# Patient Record
Sex: Male | Born: 1965 | Race: Black or African American | Hispanic: No | Smoking: Never smoker
Health system: Southern US, Community
[De-identification: ages and names within clinical notes are randomized; demographics above are authoritative.]

## PROBLEM LIST (undated history)

## (undated) DIAGNOSIS — Z87442 Personal history of urinary calculi: Secondary | ICD-10-CM

## (undated) DIAGNOSIS — J189 Pneumonia, unspecified organism: Secondary | ICD-10-CM

## (undated) DIAGNOSIS — H269 Unspecified cataract: Secondary | ICD-10-CM

## (undated) DIAGNOSIS — F419 Anxiety disorder, unspecified: Secondary | ICD-10-CM

## (undated) DIAGNOSIS — D649 Anemia, unspecified: Secondary | ICD-10-CM

## (undated) DIAGNOSIS — K279 Peptic ulcer, site unspecified, unspecified as acute or chronic, without hemorrhage or perforation: Secondary | ICD-10-CM

## (undated) DIAGNOSIS — T8859XA Other complications of anesthesia, initial encounter: Secondary | ICD-10-CM

## (undated) DIAGNOSIS — F32A Depression, unspecified: Secondary | ICD-10-CM

## (undated) DIAGNOSIS — K648 Other hemorrhoids: Secondary | ICD-10-CM

## (undated) DIAGNOSIS — A6000 Herpesviral infection of urogenital system, unspecified: Secondary | ICD-10-CM

## (undated) DIAGNOSIS — M479 Spondylosis, unspecified: Secondary | ICD-10-CM

## (undated) DIAGNOSIS — T7840XA Allergy, unspecified, initial encounter: Secondary | ICD-10-CM

## (undated) DIAGNOSIS — F4310 Post-traumatic stress disorder, unspecified: Secondary | ICD-10-CM

## (undated) DIAGNOSIS — I82409 Acute embolism and thrombosis of unspecified deep veins of unspecified lower extremity: Secondary | ICD-10-CM

## (undated) DIAGNOSIS — R519 Headache, unspecified: Secondary | ICD-10-CM

## (undated) DIAGNOSIS — D126 Benign neoplasm of colon, unspecified: Secondary | ICD-10-CM

## (undated) DIAGNOSIS — I2699 Other pulmonary embolism without acute cor pulmonale: Secondary | ICD-10-CM

## (undated) DIAGNOSIS — K760 Fatty (change of) liver, not elsewhere classified: Secondary | ICD-10-CM

## (undated) DIAGNOSIS — G4730 Sleep apnea, unspecified: Secondary | ICD-10-CM

## (undated) DIAGNOSIS — U071 COVID-19: Secondary | ICD-10-CM

## (undated) DIAGNOSIS — F329 Major depressive disorder, single episode, unspecified: Secondary | ICD-10-CM

## (undated) DIAGNOSIS — Z8614 Personal history of Methicillin resistant Staphylococcus aureus infection: Secondary | ICD-10-CM

## (undated) DIAGNOSIS — E785 Hyperlipidemia, unspecified: Secondary | ICD-10-CM

## (undated) DIAGNOSIS — N200 Calculus of kidney: Secondary | ICD-10-CM

## (undated) HISTORY — DX: Personal history of urinary calculi: Z87.442

## (undated) HISTORY — DX: Calculus of kidney: N20.0

## (undated) HISTORY — DX: Anxiety disorder, unspecified: F41.9

## (undated) HISTORY — DX: Other hemorrhoids: K64.8

## (undated) HISTORY — PX: EYE SURGERY: SHX253

## (undated) HISTORY — DX: Herpesviral infection of urogenital system, unspecified: A60.00

## (undated) HISTORY — DX: Allergy, unspecified, initial encounter: T78.40XA

## (undated) HISTORY — PX: POLYPECTOMY: SHX149

## (undated) HISTORY — DX: Major depressive disorder, single episode, unspecified: F32.9

## (undated) HISTORY — DX: Depression, unspecified: F32.A

## (undated) HISTORY — DX: Benign neoplasm of colon, unspecified: D12.6

## (undated) HISTORY — DX: Spondylosis, unspecified: M47.9

## (undated) HISTORY — DX: COVID-19: U07.1

## (undated) HISTORY — DX: Hyperlipidemia, unspecified: E78.5

## (undated) HISTORY — DX: Anemia, unspecified: D64.9

## (undated) HISTORY — DX: Fatty (change of) liver, not elsewhere classified: K76.0

## (undated) HISTORY — PX: COLONOSCOPY: SHX174

## (undated) HISTORY — DX: Unspecified cataract: H26.9

## (undated) HISTORY — DX: Peptic ulcer, site unspecified, unspecified as acute or chronic, without hemorrhage or perforation: K27.9

---

## 1988-05-04 HISTORY — PX: INGUINAL HERNIA REPAIR: SUR1180

## 1994-05-04 HISTORY — PX: CATARACT EXTRACTION: SUR2

## 2000-04-08 ENCOUNTER — Emergency Department (HOSPITAL_COMMUNITY): Admission: EM | Admit: 2000-04-08 | Discharge: 2000-04-08 | Payer: Self-pay | Admitting: *Deleted

## 2004-02-08 ENCOUNTER — Encounter: Admission: RE | Admit: 2004-02-08 | Discharge: 2004-02-08 | Payer: Self-pay | Admitting: Orthopedic Surgery

## 2004-05-04 DIAGNOSIS — Z87442 Personal history of urinary calculi: Secondary | ICD-10-CM

## 2004-05-04 DIAGNOSIS — N189 Chronic kidney disease, unspecified: Secondary | ICD-10-CM

## 2004-05-04 HISTORY — PX: CYSTOSCOPY W/ STONE MANIPULATION: SHX1427

## 2004-05-04 HISTORY — DX: Chronic kidney disease, unspecified: N18.9

## 2004-05-04 HISTORY — DX: Personal history of urinary calculi: Z87.442

## 2004-07-29 ENCOUNTER — Emergency Department (HOSPITAL_COMMUNITY): Admission: EM | Admit: 2004-07-29 | Discharge: 2004-07-29 | Payer: Self-pay | Admitting: *Deleted

## 2004-09-16 ENCOUNTER — Ambulatory Visit (HOSPITAL_COMMUNITY): Admission: RE | Admit: 2004-09-16 | Discharge: 2004-09-16 | Payer: Self-pay | Admitting: Family Medicine

## 2004-09-28 ENCOUNTER — Emergency Department (HOSPITAL_COMMUNITY): Admission: EM | Admit: 2004-09-28 | Discharge: 2004-09-28 | Payer: Self-pay | Admitting: Emergency Medicine

## 2004-10-03 ENCOUNTER — Ambulatory Visit: Payer: Self-pay | Admitting: Internal Medicine

## 2004-10-10 ENCOUNTER — Ambulatory Visit: Payer: Self-pay | Admitting: Internal Medicine

## 2004-10-27 ENCOUNTER — Ambulatory Visit: Payer: Self-pay | Admitting: Internal Medicine

## 2004-11-13 ENCOUNTER — Ambulatory Visit: Payer: Self-pay | Admitting: Internal Medicine

## 2004-11-18 ENCOUNTER — Ambulatory Visit: Payer: Self-pay | Admitting: Internal Medicine

## 2005-10-22 ENCOUNTER — Emergency Department (HOSPITAL_COMMUNITY): Admission: EM | Admit: 2005-10-22 | Discharge: 2005-10-22 | Payer: Self-pay | Admitting: Emergency Medicine

## 2006-03-10 ENCOUNTER — Ambulatory Visit (HOSPITAL_BASED_OUTPATIENT_CLINIC_OR_DEPARTMENT_OTHER): Admission: RE | Admit: 2006-03-10 | Discharge: 2006-03-10 | Payer: Self-pay | Admitting: Urology

## 2006-08-05 ENCOUNTER — Ambulatory Visit: Payer: Self-pay | Admitting: Internal Medicine

## 2006-08-05 LAB — CONVERTED CEMR LAB
ALT: 34 units/L (ref 0–40)
AST: 32 units/L (ref 0–37)
Albumin: 4.3 g/dL (ref 3.5–5.2)
Alkaline Phosphatase: 82 units/L (ref 39–117)
BUN: 8 mg/dL (ref 6–23)
Basophils Absolute: 0.1 10*3/uL (ref 0.0–0.1)
Basophils Relative: 0.8 % (ref 0.0–1.0)
Bilirubin, Direct: 0.2 mg/dL (ref 0.0–0.3)
CO2: 27 meq/L (ref 19–32)
Calcium: 9.4 mg/dL (ref 8.4–10.5)
Chloride: 107 meq/L (ref 96–112)
Cholesterol: 211 mg/dL (ref 0–200)
Creatinine, Ser: 1 mg/dL (ref 0.4–1.5)
Direct LDL: 150.4 mg/dL
Eosinophils Absolute: 0.3 10*3/uL (ref 0.0–0.6)
Eosinophils Relative: 4.5 % (ref 0.0–5.0)
GFR calc Af Amer: 107 mL/min
GFR calc non Af Amer: 88 mL/min
Glucose, Bld: 88 mg/dL (ref 70–99)
HCT: 49.6 % (ref 39.0–52.0)
HDL: 43 mg/dL (ref >39.0)
Hemoglobin: 16.3 g/dL (ref 13.0–17.0)
Hgb A1c MFr Bld: 5.9 % (ref 4.6–6.0)
Lymphocytes Relative: 31.8 % (ref 12.0–46.0)
MCHC: 32.9 g/dL (ref 30.0–36.0)
MCV: 86.2 fL (ref 78.0–100.0)
Monocytes Absolute: 0.7 10*3/uL (ref 0.2–0.7)
Monocytes Relative: 9.1 % (ref 3.0–11.0)
Neutro Abs: 3.9 10*3/uL (ref 1.4–7.7)
Neutrophils Relative %: 53.8 % (ref 43.0–77.0)
PSA: 0.51 ng/mL (ref 0.10–4.00)
Platelets: 226 10*3/uL (ref 150–400)
Potassium: 4.5 meq/L (ref 3.5–5.1)
RBC: 5.75 M/uL (ref 4.22–5.81)
RDW: 14 % (ref 11.5–14.6)
Sodium: 140 meq/L (ref 135–145)
TSH: 0.8 microintl units/mL (ref 0.35–5.50)
Total Bilirubin: 1.1 mg/dL (ref 0.3–1.2)
Total CHOL/HDL Ratio: 4.9
Total Protein: 7 g/dL (ref 6.0–8.3)
Triglycerides: 127 mg/dL (ref 0–149)
VLDL: 25 mg/dL (ref 0–40)
WBC: 7.4 10*3/uL (ref 4.5–10.5)

## 2007-10-23 ENCOUNTER — Emergency Department (HOSPITAL_COMMUNITY): Admission: EM | Admit: 2007-10-23 | Discharge: 2007-10-23 | Payer: Self-pay | Admitting: Emergency Medicine

## 2007-11-18 ENCOUNTER — Encounter (INDEPENDENT_AMBULATORY_CARE_PROVIDER_SITE_OTHER): Payer: Self-pay | Admitting: *Deleted

## 2008-02-16 ENCOUNTER — Ambulatory Visit: Payer: Self-pay | Admitting: Internal Medicine

## 2008-02-16 DIAGNOSIS — E785 Hyperlipidemia, unspecified: Secondary | ICD-10-CM | POA: Insufficient documentation

## 2008-02-16 DIAGNOSIS — Z87442 Personal history of urinary calculi: Secondary | ICD-10-CM | POA: Insufficient documentation

## 2008-02-16 DIAGNOSIS — J4530 Mild persistent asthma, uncomplicated: Secondary | ICD-10-CM | POA: Insufficient documentation

## 2008-02-16 DIAGNOSIS — R9431 Abnormal electrocardiogram [ECG] [EKG]: Secondary | ICD-10-CM | POA: Insufficient documentation

## 2008-02-23 ENCOUNTER — Ambulatory Visit: Payer: Self-pay | Admitting: Internal Medicine

## 2008-02-26 LAB — CONVERTED CEMR LAB
ALT: 43 units/L (ref 0–53)
AST: 30 units/L (ref 0–37)
Albumin: 3.9 g/dL (ref 3.5–5.2)
Alkaline Phosphatase: 68 units/L (ref 39–117)
BUN: 9 mg/dL (ref 6–23)
Basophils Absolute: 0 10*3/uL (ref 0.0–0.1)
Basophils Relative: 0.3 % (ref 0.0–3.0)
Bilirubin, Direct: 0.1 mg/dL (ref 0.0–0.3)
CO2: 30 meq/L (ref 19–32)
Calcium: 9.1 mg/dL (ref 8.4–10.5)
Chloride: 104 meq/L (ref 96–112)
Cholesterol: 199 mg/dL (ref 0–200)
Creatinine, Ser: 1 mg/dL (ref 0.4–1.5)
Eosinophils Absolute: 0.2 10*3/uL (ref 0.0–0.7)
Eosinophils Relative: 3.7 % (ref 0.0–5.0)
GFR calc Af Amer: 106 mL/min
GFR calc non Af Amer: 88 mL/min
Glucose, Bld: 94 mg/dL (ref 70–99)
HCT: 48.5 % (ref 39.0–52.0)
HDL: 45.9 mg/dL (ref >39.0)
Hemoglobin: 16.5 g/dL (ref 13.0–17.0)
LDL Cholesterol: 130 mg/dL — ABNORMAL HIGH (ref 0–99)
Lymphocytes Relative: 35.1 % (ref 12.0–46.0)
MCHC: 34.1 g/dL (ref 30.0–36.0)
MCV: 85.4 fL (ref 78.0–100.0)
Monocytes Absolute: 0.5 10*3/uL (ref 0.1–1.0)
Monocytes Relative: 9.3 % (ref 3.0–12.0)
Neutro Abs: 2.5 10*3/uL (ref 1.4–7.7)
Neutrophils Relative %: 51.6 % (ref 43.0–77.0)
Platelets: 198 10*3/uL (ref 150–400)
Potassium: 4.3 meq/L (ref 3.5–5.1)
RBC: 5.67 M/uL (ref 4.22–5.81)
RDW: 14.2 % (ref 11.5–14.6)
Sodium: 140 meq/L (ref 135–145)
TSH: 1.08 microintl units/mL (ref 0.35–5.50)
Total Bilirubin: 1.1 mg/dL (ref 0.3–1.2)
Total CHOL/HDL Ratio: 4.3
Total Protein: 7.1 g/dL (ref 6.0–8.3)
Triglycerides: 114 mg/dL (ref 0–149)
VLDL: 23 mg/dL (ref 0–40)
WBC: 5 10*3/uL (ref 4.5–10.5)

## 2008-02-28 ENCOUNTER — Encounter (INDEPENDENT_AMBULATORY_CARE_PROVIDER_SITE_OTHER): Payer: Self-pay | Admitting: *Deleted

## 2008-06-05 ENCOUNTER — Encounter: Payer: Self-pay | Admitting: Internal Medicine

## 2008-08-02 ENCOUNTER — Telehealth (INDEPENDENT_AMBULATORY_CARE_PROVIDER_SITE_OTHER): Payer: Self-pay | Admitting: *Deleted

## 2008-08-02 ENCOUNTER — Ambulatory Visit: Payer: Self-pay | Admitting: Internal Medicine

## 2008-08-13 ENCOUNTER — Encounter: Payer: Self-pay | Admitting: Internal Medicine

## 2008-08-17 ENCOUNTER — Ambulatory Visit: Payer: Self-pay | Admitting: Internal Medicine

## 2008-08-17 ENCOUNTER — Telehealth: Payer: Self-pay | Admitting: Internal Medicine

## 2008-08-22 ENCOUNTER — Encounter (INDEPENDENT_AMBULATORY_CARE_PROVIDER_SITE_OTHER): Payer: Self-pay | Admitting: *Deleted

## 2008-08-22 LAB — CONVERTED CEMR LAB
ALT: 39 units/L (ref 0–53)
AST: 26 units/L (ref 0–37)
Albumin: 3.9 g/dL (ref 3.5–5.2)
Alkaline Phosphatase: 72 units/L (ref 39–117)
Bilirubin, Direct: 0.1 mg/dL (ref 0.0–0.3)
Total Bilirubin: 0.9 mg/dL (ref 0.3–1.2)
Total Protein: 7.4 g/dL (ref 6.0–8.3)

## 2008-09-12 ENCOUNTER — Encounter: Payer: Self-pay | Admitting: Internal Medicine

## 2009-01-27 ENCOUNTER — Emergency Department (HOSPITAL_COMMUNITY): Admission: EM | Admit: 2009-01-27 | Discharge: 2009-01-28 | Payer: Self-pay | Admitting: Emergency Medicine

## 2009-02-01 ENCOUNTER — Ambulatory Visit: Payer: Self-pay | Admitting: Internal Medicine

## 2009-02-01 ENCOUNTER — Encounter (INDEPENDENT_AMBULATORY_CARE_PROVIDER_SITE_OTHER): Payer: Self-pay | Admitting: *Deleted

## 2009-02-04 ENCOUNTER — Encounter (INDEPENDENT_AMBULATORY_CARE_PROVIDER_SITE_OTHER): Payer: Self-pay | Admitting: *Deleted

## 2009-03-19 ENCOUNTER — Telehealth: Payer: Self-pay | Admitting: Family

## 2009-03-19 ENCOUNTER — Ambulatory Visit: Payer: Self-pay | Admitting: Family

## 2009-03-19 ENCOUNTER — Telehealth: Payer: Self-pay | Admitting: Internal Medicine

## 2009-03-19 LAB — CONVERTED CEMR LAB: Hemoglobin: 15.2 g/dL

## 2009-03-20 ENCOUNTER — Ambulatory Visit: Payer: Self-pay | Admitting: Internal Medicine

## 2009-04-03 ENCOUNTER — Telehealth: Payer: Self-pay | Admitting: Internal Medicine

## 2009-04-04 ENCOUNTER — Encounter: Payer: Self-pay | Admitting: Internal Medicine

## 2009-05-04 DIAGNOSIS — D126 Benign neoplasm of colon, unspecified: Secondary | ICD-10-CM

## 2009-05-04 HISTORY — DX: Benign neoplasm of colon, unspecified: D12.6

## 2009-05-04 HISTORY — PX: OTHER SURGICAL HISTORY: SHX169

## 2009-05-28 ENCOUNTER — Telehealth (INDEPENDENT_AMBULATORY_CARE_PROVIDER_SITE_OTHER): Payer: Self-pay | Admitting: *Deleted

## 2009-05-28 ENCOUNTER — Encounter: Payer: Self-pay | Admitting: Physician Assistant

## 2009-05-30 ENCOUNTER — Ambulatory Visit: Payer: Self-pay | Admitting: Internal Medicine

## 2009-06-04 ENCOUNTER — Encounter: Payer: Self-pay | Admitting: Internal Medicine

## 2009-06-07 ENCOUNTER — Telehealth: Payer: Self-pay | Admitting: Internal Medicine

## 2009-06-21 ENCOUNTER — Ambulatory Visit: Payer: Self-pay | Admitting: Internal Medicine

## 2009-06-21 DIAGNOSIS — Z860100 Personal history of colon polyps, unspecified: Secondary | ICD-10-CM | POA: Insufficient documentation

## 2009-06-28 ENCOUNTER — Ambulatory Visit: Payer: Self-pay | Admitting: Internal Medicine

## 2009-06-28 LAB — CONVERTED CEMR LAB
ALT: 47 units/L (ref 0–53)
AST: 31 units/L (ref 0–37)
Albumin: 4.2 g/dL (ref 3.5–5.2)
Alkaline Phosphatase: 76 units/L (ref 39–117)
BUN: 8 mg/dL (ref 6–23)
Basophils Absolute: 0 10*3/uL (ref 0.0–0.1)
Basophils Relative: 0.1 % (ref 0.0–3.0)
Bilirubin, Direct: 0.3 mg/dL (ref 0.0–0.3)
CO2: 26 meq/L (ref 19–32)
Calcium: 9.3 mg/dL (ref 8.4–10.5)
Chloride: 110 meq/L (ref 96–112)
Cholesterol: 212 mg/dL — ABNORMAL HIGH (ref 0–200)
Creatinine, Ser: 1 mg/dL (ref 0.4–1.5)
Direct LDL: 140.9 mg/dL
Eosinophils Absolute: 0.2 10*3/uL (ref 0.0–0.7)
Eosinophils Relative: 4.8 % (ref 0.0–5.0)
GFR calc non Af Amer: 105.17 mL/min (ref >60)
Glucose, Bld: 98 mg/dL (ref 70–99)
HCT: 48.3 % (ref 39.0–52.0)
HDL: 52.2 mg/dL (ref >39.00)
Hemoglobin: 15.8 g/dL (ref 13.0–17.0)
Hgb A1c MFr Bld: 5.9 % (ref 4.6–6.5)
Lymphocytes Relative: 32.2 % (ref 12.0–46.0)
Lymphs Abs: 1.4 10*3/uL (ref 0.7–4.0)
MCHC: 32.7 g/dL (ref 30.0–36.0)
MCV: 88.1 fL (ref 78.0–100.0)
Monocytes Absolute: 0.4 10*3/uL (ref 0.1–1.0)
Monocytes Relative: 8.9 % (ref 3.0–12.0)
Neutro Abs: 2.3 10*3/uL (ref 1.4–7.7)
Neutrophils Relative %: 54 % (ref 43.0–77.0)
Platelets: 217 10*3/uL (ref 150.0–400.0)
Potassium: 4.2 meq/L (ref 3.5–5.1)
RBC: 5.48 M/uL (ref 4.22–5.81)
RDW: 13.7 % (ref 11.5–14.6)
Sodium: 140 meq/L (ref 135–145)
TSH: 0.5 microintl units/mL (ref 0.35–5.50)
Total Bilirubin: 0.8 mg/dL (ref 0.3–1.2)
Total CHOL/HDL Ratio: 4
Total Protein: 7.3 g/dL (ref 6.0–8.3)
Triglycerides: 129 mg/dL (ref 0.0–149.0)
VLDL: 25.8 mg/dL (ref 0.0–40.0)
WBC: 4.3 10*3/uL — ABNORMAL LOW (ref 4.5–10.5)

## 2009-07-03 ENCOUNTER — Ambulatory Visit: Payer: Self-pay | Admitting: Internal Medicine

## 2009-07-03 DIAGNOSIS — J45901 Unspecified asthma with (acute) exacerbation: Secondary | ICD-10-CM | POA: Insufficient documentation

## 2010-01-17 ENCOUNTER — Ambulatory Visit: Payer: Self-pay | Admitting: Internal Medicine

## 2010-01-20 LAB — CONVERTED CEMR LAB
Cholesterol: 211 mg/dL — ABNORMAL HIGH (ref 0–200)
Direct LDL: 153.5 mg/dL
HDL: 40.9 mg/dL (ref >39.00)
Total CHOL/HDL Ratio: 5
Triglycerides: 114 mg/dL (ref 0.0–149.0)
VLDL: 22.8 mg/dL (ref 0.0–40.0)

## 2010-04-13 ENCOUNTER — Emergency Department (HOSPITAL_COMMUNITY)
Admission: EM | Admit: 2010-04-13 | Discharge: 2010-04-13 | Payer: Self-pay | Source: Home / Self Care | Admitting: Emergency Medicine

## 2010-04-19 ENCOUNTER — Emergency Department (HOSPITAL_COMMUNITY)
Admission: EM | Admit: 2010-04-19 | Discharge: 2010-04-19 | Payer: Self-pay | Source: Home / Self Care | Admitting: Emergency Medicine

## 2010-06-01 LAB — CONVERTED CEMR LAB
Cholesterol, target level: 200 mg/dL
HDL goal, serum: 40 mg/dL
LDL Goal: 100 mg/dL

## 2010-06-03 NOTE — Assessment & Plan Note (Signed)
Summary: FOR BLOOD IN HIS STOOL//PH   Vital Signs:  Patient profile:   45 year old male Weight:      198.2 pounds BMI:     31.15 O2 Sat:      96 % on Room air Temp:     97.7 degrees F oral Pulse rate:   74 / minute Pulse rhythm:   regular Resp:     12 per minute BP sitting:   120 / 80  (left arm) Cuff size:   large  Vitals Entered By: Arman Filter (March 19, 2009 11:12 AM)  O2 Flow:  Room air   CC:  c/o blood in stool.  History of Present Illness: having loose bowels, lactose intolerant-hard stool earlier in week saw some blood,  several loose bowels yesterday 12 midnight loose stool and  noted + blood on tissue and in toilet. Noted  an hour later not bleeding but blood in stool and on tissue paper.  Hemorroid 2 & 1/2 months ago but doesn't see anymore.  Notes 3-4 loose stools in last 24 hours which is not unusual for him.  Last stool with blood was at 3AM.  Total of 3 BM's with blood.  Earlier in the week he saw small amount of blood in stool x 1.  Patient notes + dairy intake despite lactose intolerant.    Allergies: 1)  ! Aleve (Naproxen Sodium)  Review of Systems       Denies rectal pain, denies sob, denies weakness, denies fever, denies nausea and vomitting. Denies dark melena stools.  Denies hematuria or epistaxis  Physical Exam  General:  Well-developed,well-nourished,in no acute distress; alert,appropriate and cooperative throughout examination Head:  Normocephalic and atraumatic without obvious abnormalities. No apparent alopecia or balding. Neck:  No deformities, masses, or tenderness noted. Lungs:  Normal respiratory effort, chest expands symmetrically. Lungs are clear to auscultation, no crackles or wheezes. Heart:  Normal rate and regular rhythm. S1 and S2 normal without gallop, murmur, click, rub or other extra sounds. Abdomen:  Bowel sounds positive,abdomen soft and non-tender without masses, organomegaly or hernias noted. Rectal:  no external abnormalities.   + pain on rectal exam.  insufficient stool gathered for hemocult.  No palpable rectal masses, exam limited by pain.   Impression & Recommendations:  Problem # 1:  HEMORRHOIDS, INTERNAL, WITH BLEEDING (ICD-455.2)  hemocue 15.2,  will plan to monitor closely.  I have arranged for the patient to be seen 11/17 AM by  GI for further evaluation.  Orders: Gastroenterology Referral (GI) Fingerstick ([address]) Hgb ([address])  Complete Medication List: 1)  Advair Diskus 100-50 Mcg/dose Misc (Fluticasone-salmeterol) .Marland Kitchen.. 1 inhalation eveery 12 hrs 2)  Ventolin Hfa 108 (90 Base) Mcg/act Aers (Albuterol sulfate) .Marland Kitchen.. 1-2 puffs q 4 -6 hrs prn  Patient Instructions: 1)  please return tomorrow for follow-up blood work.  If you develop worsening bleeding overnight, please go to the ER.    Current Allergies (reviewed today): ! ALEVE (NAPROXEN SODIUM) Updated/Current Medications (including changes made in today's visit):  ADVAIR DISKUS 100-50 MCG/DOSE MISC (FLUTICASONE-SALMETEROL) 1 inhalation eveery 12 hrs VENTOLIN HFA 108 (90 BASE) MCG/ACT AERS (ALBUTEROL SULFATE) 1-2 puffs q 4 -6 hrs prn   Laboratory Results   CBC   HGB:  15.2 g/dL   (Normal Range: 09.8-11.9 in Males, 12.0-15.0 in Females)

## 2010-06-03 NOTE — Letter (Signed)
Summary: Primary Care Appointment Letter  [facility] [place]  [address] [place], [place] [address]   Phone: [phone]  Fax: [phone]    11/18/2007 MRN: [id number]  [facility] [address] [place], [place]  [address]  Dear Mr. Leinweber,   Your Primary Care Physician  has indicated that:   Your appointment with Dr Alwyn Ren for [phone] has been rescheduled to [phone] @ 1:00 PM.  __________you missed your appointment on______ and need to call and          reschedule.    _______you need to have lab work done.    _______you need to schedule an appointment discuss lab or test results.    _______you need to call to reschedule your appointment that is                       scheduled on _________.     Please call our office as soon as possible. Our phone number is [phone]          _________. Please press option 1. Our office is open 8a-12noon and 1p-5p, Monday through Friday.     Thank you,    [facility] Scheduler

## 2010-06-03 NOTE — Letter (Signed)
Summary: [facility]  [facility]   Imported By: Lanelle Bal 09/20/2008 11:02:43  _____________________________________________________________________  External Attachment:    Type:   Image     Comment:   External Document

## 2010-06-03 NOTE — Letter (Signed)
Summary: [facility]  [facility]   Imported By: Lanelle Bal 06/13/2008 14:35:13  _____________________________________________________________________  External Attachment:    Type:   Image     Comment:   External Document

## 2010-06-03 NOTE — Progress Notes (Signed)
Summary: TRIAGE  Phone Note From Other Clinic Call back at [phone]   Caller: Sandford Craze, NP Reason for Call: Schedule Patient Appt Summary of Call: would like pt to be seen asap for lower GI bleed, possible hemorrhoids Initial call taken by: Vallarie Mare,  March 19, 2009 12:12 PM  Follow-up for Phone Call        Pt. will see [facility] on 03-20-09 at 9:30am. Efraim Kaufmann will advise pt. of appt./med.list and co-pay.  Follow-up by: Laureen Ochs LPN,  March 19, 2009 12:38 PM

## 2010-06-03 NOTE — Miscellaneous (Signed)
Summary: RX Miralax Prep  Clinical Lists Changes  Medications: Added new medication of MIRALAX   POWD (POLYETHYLENE GLYCOL 3350) As per prep  instructions. - Signed Added new medication of DULCOLAX 5 MG  TBEC (BISACODYL) Day before procedure take 2 at 3pm and 2 at 8pm. - Signed Added new medication of REGLAN 10 MG  TABS (METOCLOPRAMIDE HCL) As per prep instructions. - Signed Rx of MIRALAX   POWD (POLYETHYLENE GLYCOL 3350) As per prep  instructions.;  #255gm x 0;  Signed;  Entered by: Lowry Ram NCMA;  Authorized by: Sammuel Cooper PA-c;  Method used: [facility]  [address] [id number] [address], [place], [place]  [address], Ph: [phone] or [phone], Fax: [phone] Rx of DULCOLAX 5 MG  TBEC (BISACODYL) Day before procedure take 2 at 3pm and 2 at 8pm.;  #4 x 0;  Signed;  Entered by: Lowry Ram NCMA;  Authorized by: Sammuel Cooper PA-c;  Method used: [facility]  [address] [id number] [address] [place], [place]  [address], Ph: [phone] or [phone], Fax: [phone] Rx of REGLAN 10 MG  TABS (METOCLOPRAMIDE HCL) As per prep instructions.;  #2 x 0;  Signed;  Entered by: Lowry Ram NCMA;  Authorized by: Sammuel Cooper PA-c;  Method used: [facility]  [address] [id number] [address] [place], [place]  [address], Ph: [phone] or [phone], Fax: [phone]    Prescriptions: REGLAN 10 MG  TABS (METOCLOPRAMIDE HCL) As per prep instructions.  #2 x 0   Entered by:   Lowry Ram NCMA   Authorized by:   Sammuel Cooper PA-c   Signed by:   Lowry Ram NCMA on 05/28/2009   Method used:   [facility]        [facility] [id number]       [address]       [place], [place]  [address]       Ph: [phone] or [phone]       Fax: [phone]   RxID:   [id number] DULCOLAX 5 MG  TBEC (BISACODYL) Day before procedure take 2 at 3pm and 2 at 8pm.  #4 x 0  Entered by:   Lowry Ram NCMA   Authorized by:   Sammuel Cooper PA-c   Signed by:   Lowry Ram NCMA on 05/28/2009   Method used:   [facility]        [facility]  [address] [id number]       [address]       [place], [place]  [address]       Ph: [phone] or [phone]       Fax: [phone]   RxID:   [id number] MIRALAX   POWD (POLYETHYLENE GLYCOL 3350) As per prep  instructions.  #255gm x 0   Entered by:   Lowry Ram NCMA   Authorized by:   Sammuel Cooper PA-c   Signed by:   Lowry Ram NCMA on 05/28/2009   Method used:   [facility]        [facility]  [address] [id number]       [address]       [place], [place]  [address]       Ph: [phone] or [phone]       Fax:  [phone]   RxID:   [id number]

## 2010-06-03 NOTE — Progress Notes (Signed)
Summary: Colon prep instructions  Phone Note Outgoing Call   Call placed by: Joselyn Glassman,  May 28, 2009 4:50 PM Call placed to: Patient Summary of Call: Pt called and will come in tomorrow 05-29-09 at 8:00 Am for me to explain his Colonoscopy prep to him. He thought I would send it to him but when we saw him in Nov  2010 I told him I would leave his instructions at the front desk. He never picked them up. Initial call taken by: Joselyn Glassman,  May 28, 2009 4:52 PM

## 2010-06-03 NOTE — Letter (Signed)
Summary: Results Follow-up Letter  [facility]  [address] [place], [place] [address]   Phone: [phone]  Fax: [phone]    02/28/2008        Anthony [name] [address] [place], [place]  [address]  Dear [name],   The following are the results of your recent test(s):  Test     Result     Pap Smear    Normal_______  Not Normal_____       Comments: _________________________________________________________ Cholesterol LDL(Bad cholesterol):          Your goal is less than:         HDL (Good cholesterol):        Your goal is more than: _________________________________________________________ Other Tests:   _________________________________________________________  Please call for an appointment Or _Please see attached labwork and new medication.________________________________________________________ _________________________________________________________ _________________________________________________________  Sincerely,  Ardyth Man [place] [facility]

## 2010-06-03 NOTE — Consult Note (Signed)
Summary: [facility]  [facility]   Imported By: Lanelle Bal 08/27/2008 09:27:11  _____________________________________________________________________  External Attachment:    Type:   Image     Comment:   External Document

## 2010-06-03 NOTE — Progress Notes (Signed)
Summary: TRIAGE  Phone Note Call from Patient Call back at Home Phone [phone]   Caller: Patient Call For: Juanda Chance Reason for Call: Talk to Nurse Summary of Call: Patient has questions regarding his colon results Initial call taken by: Tawni Levy,  June 07, 2009 10:10 AM  Follow-up for Phone Call        Colonoscopy 05-30-09, letter mailed on 06-05-09. All questions answered, letter reviewed. Pt. instructed to call back as needed.  Follow-up by: Laureen Ochs LPN,  June 07, 2009 10:37 AM

## 2010-06-03 NOTE — Letter (Signed)
Summary: [facility] Instructions  [facility]  [address] [place], [place] [address]   Phone: [phone]  Fax: [phone]       Anthony [name]    11-05-1965    MRN: [id number]       Procedure Day /Date:04-04-09     Arrival Time: 3:00 PM     Procedure Time: 4:00 PM     Location of Procedure:                    X     [facility] (4th Floor)    PREPARATION FOR COLONOSCOPY WITH MIRALAX  Starting 5 days prior to your procedure 03-30-09  do not eat nuts, seeds, popcorn, corn, beans, peas,  salads, or any raw vegetables.  Do not take any fiber supplements (e.g. Metamucil, Citrucel, and Benefiber). ____________________________________________________________________________________________________   THE DAY BEFORE YOUR PROCEDURE         DATE: 04-03-09 DAY: Wednesday  1   Drink clear liquids the entire day-NO SOLID FOOD  2   Do not drink anything colored red or purple.  Avoid juices with pulp.  No orange juice.  3   Drink at least 64 oz. (8 glasses) of fluid/clear liquids during the day to prevent dehydration and help the prep work efficiently.  CLEAR LIQUIDS INCLUDE: Water Jello Ice Popsicles Tea (sugar ok, no milk/cream) Powdered fruit flavored drinks Coffee (sugar ok, no milk/cream) Gatorade Juice: apple, white grape, white cranberry  Lemonade Clear bullion, consomm, broth Carbonated beverages (any kind) Strained chicken noodle soup Hard Candy  4   Mix the entire bottle of Miralax with 64 oz. of Gatorade/Powerade in the morning and put in the refrigerator to chill.  5   At 3:00 pm take 2 Dulcolax/Bisacodyl tablets.  6   At 4:30 pm take one Reglan/Metoclopramide tablet.  7  Starting at 5:00 pm drink one 8 oz glass of the Miralax mixture every 15-20 minutes until you have finished drinking the entire 64 oz.  You should finish drinking prep around 7:30 or 8:00 pm.  8   If you are nauseated, you may take the 2nd Reglan/Metoclopramide tablet at  6:30 pm.        9    At 8:00 pm take 2 more DULCOLAX/Bisacodyl tablets.     THE DAY OF YOUR PROCEDURE      DATE:  04-04-09 DAY: Thursday  You may drink clear liquids until 2:00 PM (2 HOURS BEFORE PROCEDURE).   MEDICATION INSTRUCTIONS  Unless otherwise instructed, you should take regular prescription medications with a small sip of water as early as possible the morning of your procedure.       OTHER INSTRUCTIONS  You will need a responsible adult at least 44 years of age to accompany you and drive you home.   This person must remain in the waiting room during your procedure.  Wear loose fitting clothing that is easily removed.  Leave jewelry and other valuables at home.  However, you may wish to bring a book to read or an iPod/MP3 player to listen to music as you wait for your procedure to start.  Remove all body piercing jewelry and leave at home.  Total time from sign-in until discharge is approximately 2-3 hours.  You should go home directly after your procedure and rest.  You can resume normal activities the day after your procedure.  The day of your procedure you should not:   Drive   Make legal decisions  Operate machinery   Drink alcohol   Return to work  You will receive specific instructions about eating, activities and medications before you leave.   The above instructions have been reviewed and explained to me by   _______________________    I fully understand and can verbalize these instructions _____________________________ Date _______

## 2010-06-03 NOTE — Assessment & Plan Note (Signed)
Summary: RECTAL BLEEDING      (NEW TO GI)         Anthony [name]   History of Present Illness Visit Type: Initial Consult Primary GI MD: Lina Sar MD Primary Provider: Chrissie Noa hopper History of Present Illness:   45 YO MALE NEW TO GI TODAY ,REFERRED FOR RECTAL BLEEDING.PT HAS NOT HAD ANY PRIOR GI EVALUATION,HE SAYS HE THINKS HE DID HAVE A HEMORRHOID SEVERAL YEARS AGO. HE HAD ONSET 2 DAYS AGO WITH BM FOLLOWED BYSMALL AMT OF BRB ON THE TISSUE. THE NEXT 2 BM APPEARED BROWN BUT WITH BRB MIXED IN. HE HAS NO C/O ABDOMINAL PAIN, CRAMPING.HE DEIEA ANT RECTAL DISCOMFORT. HE SAYS ONE DAY LAST WEEK HE DOES REMEMBER HAVING SOME INTERNAL RECTAL DISCOMFORT WHICH FELT HIGH UP IN THE RECTUM. HE WAS SEEN BY PRIMARY CARE YESTERDAY-HGB 15.2. RECTAL EXAM NEGATIVE EXTERNALLY,VERY TENDER INTERNALLY BUT NO LESION NOTED,NO STOOL TO HEMOCULT.   GI Review of Systems      Denies abdominal pain, acid reflux, belching, bloating, chest pain, dysphagia with liquids, dysphagia with solids, heartburn, loss of appetite, nausea, vomiting, vomiting blood, and  weight loss.      Reports rectal bleeding and  rectal pain.     Denies anal fissure, black tarry stools, change in bowel habit, constipation, diarrhea, diverticulosis, fecal incontinence, heme positive stool, hemorrhoids, irritable bowel syndrome, jaundice, light color stool, and  liver problems.    Current Medications (verified): 1)  Advair Diskus 100-50 Mcg/dose Misc (Fluticasone-Salmeterol) .Marland Kitchen.. 1 Inhalation Eveery 12 Hrs 2)  Ventolin Hfa 108 (90 Base) Mcg/act Aers (Albuterol Sulfate) .Marland Kitchen.. 1-2 Puffs Q 4 -6 Hrs Prn  Allergies (verified): 1)  ! Aleve (Naproxen Sodium)  Past History:  Past Medical History: Reviewed history from 02/16/2008 and no changes required. Asthma Hyperlipidemia Short QT with normal Ca++(X4) Nephrolithiasis, hx of, Dr Retta Diones  Past Surgical History: Reviewed history from 02/16/2008 and no changes required. Cataract  laser extraction age  72 Inguinal herniorrhaphy  Family History: Reviewed history from 02/16/2008 and no changes required. Father: CAD,DM,HTN,CRD,glaucoma, lipids Mother: d 57 in sleep Siblings: bro chest pain  Social History: Reviewed history from 02/16/2008 and no changes required. Occupation: Banker Divorced Never Smoked Alcohol use-yes Regular exercise-no  Review of Systems       The patient complains of hematochezia.  The patient denies allergy/sinus, anemia, anxiety-new, arthritis/joint pain, back pain, blood in urine, breast changes/lumps, change in vision, confusion, cough, coughing up blood, depression-new, fainting, fatigue, fever, headaches-new, itching, muscle pains/cramps, night sweats, nosebleeds, shortness of breath, skin rash, sleeping problems, sore throat, swelling of feet/legs, swollen lymph glands, thirst - excessive, urination - excessive, urination changes/pain, urine leakage, vision changes, and voice change.         ROS COMPLETELY NEGATIVE EXCEPT AS IN HPI  Vital Signs:  Patient profile:   45 year old male Height:      67 inches Weight:      199 pounds BMI:     31.28 Pulse rate:   60 / minute BP sitting:   118 / 76  (left arm)  Vitals Entered By: Lowry Ram NCMA (March 20, 2009 9:34 AM)  Physical Exam  General:  Well developed, well nourished, no acute distress. Head:  Normocephalic and atraumatic. Eyes:  PERRLA, no icterus. Lungs:  Clear throughout to auscultation. Heart:  Regular rate and rhythm; no murmurs, rubs,  or bruits. Abdomen:  SOFT,NONTENDER, NO MASS OR HSM,BS+ Rectal:  NOT REPEATED Extremities:  No clubbing, cyanosis, edema or deformities noted. Neurologic:  Alert and  oriented x4;  grossly normal neurologically. Psych:  Alert and cooperative. Normal mood and affect.   Impression & Recommendations:  Problem # 1:  RECTAL BLEEDING (ICD-569.3) Assessment New 45 YO MALE  WITH NEW ONSET SMALL VOLUME  HEMATOCHEZIA- R/O INTERNAL HEMORRHOIDAL,VS  OCCULT LESION  FINGER STICK HGB 15.2 YESTERDAY ,SO WILL NOT DO CBC SCHEDULE FOR COLONOSCOPY WITH DR Juanda Chance ,PROCEDURE,INDICATIONS,COMPLICATIONS DISCUSSED IN DETAIL WITH PT. Orders: Colonoscopy (Colon)  Problem # 2:  NEPHROLITHIASIS, HX OF (ICD-V13.01) Assessment: Comment Only  Problem # 3:  ASTHMA (ICD-493.90) Assessment: Comment Only  Patient Instructions: 1)  We have scheduled the Colonoscopy with Dr. Juanda Chance for 04-04-09. 2)  Colonoscopy and conscous sedation brochure provided. 3)  We sent the prescription for the  [facility]ax [facility] [address] Prescriptions: REGLAN 10 MG  TABS (METOCLOPRAMIDE HCL) As per prep instructions.  #2 x 0   Entered by:   Lowry Ram NCMA   Authorized by:   Sammuel Cooper PA-c   Signed by:   Lowry Ram NCMA on 03/20/2009   Method used:   [facility]  [address] [id number]       [address]       [place], [place]  [address]       Ph: [phone] or [phone]       Fax: [phone]   RxID:   [id number] DULCOLAX 5 MG  TBEC (BISACODYL) Day before procedure take 2 at 3pm and 2 at 8pm.  #4 x 0   Entered by:   Lowry Ram NCMA   Authorized by:   Sammuel Cooper PA-c   Signed by:   Lowry Ram NCMA on 03/20/2009   Method used:   [facility]        [facility]  [address] [id number]       [address]       [place], [place]  [address]       Ph: [phone] or [phone]       Fax: [phone]   RxID:   [id number] MIRALAX   POWD (POLYETHYLENE GLYCOL 3350) As per prep  instructions.  #255gm x 0   Entered by:   Lowry Ram NCMA   Authorized by:   Sammuel Cooper PA-c   Signed by:   Lowry Ram NCMA on 03/20/2009   Method used:   [facility]        [facility]  [address] [id number]       [address]       [place], [place]  [address]       Ph: [phone] or [phone]       Fax: [phone]   RxID:   [id number]

## 2010-06-03 NOTE — Letter (Signed)
Summary: Primary Care Appointment Letter  [facility] [place]  [address] [place], [place] [address]   Phone: [phone]  Fax: [phone]    11/18/2007 MRN: [id number]  [facility] [address] [place], [place]  [address]  Dear Mr. Riles,   Your Primary Care Physician  has indicated that:    _______it is time to schedule an appointment.    _______you missed your appointment on______ and need to call and          reschedule.    _______you need to have lab work done.    _______you need to schedule an appointment discuss lab or test results.    _______you need to call to reschedule your appointment that is                       scheduled on _________.     Please call our office as soon as possible. Our phone number is [phone]          _________. Please press option 1. Our office is open 8a-12noon and 1p-5p, Monday through Friday.     Thank you,    [place] [facility] Scheduler

## 2010-06-03 NOTE — Assessment & Plan Note (Signed)
Summary: followup from ed pneumonia/alr   Vital Signs:  Patient profile:   45 year old male Weight:      1994.25 pounds Temp:     97.7 degrees F oral Resp:     17 per minute BP sitting:   110 / 80  Vitals Entered By: Kandice Hams (February 01, 2009 12:40 PM) CC: FOLLOWUP FROM ED DX WITH PNEUMONIA [place] FINISHED AZITHROMYCIN 250MG  ON FOR 4 DAYS   CC:  FOLLOWUP FROM ED DX WITH PNEUMONIA [place] FINISHED AZITHROMYCIN 250MG  ON FOR 4 DAYS.  History of Present Illness: ER 01/28/2009  record reviewed ; asthma flred with PNA. Zithromax finished last night , butg residual chest congestion . Pleuritic L cp improved.  Allergies: 1)  ! Aleve (Naproxen Sodium)  Review of Systems General:  Complains of sweats; denies chills and fever. ENT:  Complains of nasal congestion and sinus pressure; No facial pain , frontal headache , or purulence. Resp:  Complains of cough, pleuritic, and sputum productive; denies coughing up blood, shortness of breath, and wheezing.  Physical Exam  General:  well-nourished,in no acute distress; alert,appropriate and cooperative throughout examination Ears:  External ear exam shows no significant lesions or deformities.  Otoscopic examination reveals clear canals, tympanic membranes are intact bilaterally without bulging, retraction, inflammation or discharge. Hearing is grossly normal bilaterally. Nose:  External nasal examination shows no deformity or inflammation. Nasal mucosa are erythematous  with polypoid changes Mouth:  Oral mucosa and oropharynx without lesions or exudates.  Teeth in good repair. Lungs:  Normal respiratory effort, chest expands symmetrically. Lungs are clear to auscultation, no crackles or wheezes. Cervical Nodes:  No lymphadenopathy noted Axillary Nodes:  No palpable lymphadenopathy   Impression & Recommendations:  Problem # 1:  PNEUMOCOCCAL PNEUMONIA (ICD-481)  The following medications were removed from the medication list:  Amoxicillin-pot Clavulanate 875-125 Mg Tabs (Amoxicillin-pot clavulanate) .Marland Kitchen... 1 q 12 hrs with a meal  Orders: T-2 View CXR (71020TC)  Complete Medication List: 1)  Advair Diskus 100-50 Mcg/dose Misc (Fluticasone-salmeterol) .Marland Kitchen.. 1 inhalation eveery 12 hrs 2)  Ventolin Hfa 108 (90 Base) Mcg/act Aers (Albuterol sulfate) .Marland Kitchen.. 1-2 puffs q 4 -6 hrs prn 3)  Pravastatin Sodium 40 Mg Tabs (Pravastatin sodium) .Marland Kitchen.. 1 qhs 4)  Promethazine Vc/codeine 6.25-5-10 Mg/1ml Syrp (Phenyleph-promethazine-cod) .Marland Kitchen.. 1 tsp q 6 hrs as needed  Patient Instructions: 1)  Neti pot once daily for  nasal congestion. 2)  Drink as much fluid as you can tolerate for the next few days. Prescriptions: PROMETHAZINE VC/CODEINE 6.25-5-10 MG/5ML SYRP (PHENYLEPH-PROMETHAZINE-COD) 1 tsp q 6 hrs as needed  #120cc x 0   Entered and Authorized by:   Marga Melnick MD   Signed by:   Marga Melnick MD on 02/01/2009   Method used:   Print then Give to Patient   RxID:   [id number] VENTOLIN HFA 108 (90 BASE) MCG/ACT AERS (ALBUTEROL SULFATE) 1-2 puffs q 4 -6 hrs prn  #1 x 2   Entered and Authorized by:   Marga Melnick MD   Signed by:   Marga Melnick MD on 02/01/2009   Method used:   Print then Give to Patient   RxID:   [id number] ADVAIR DISKUS 100-50 MCG/DOSE MISC (FLUTICASONE-SALMETEROL) 1 inhalation eveery 12 hrs  #1 x 11   Entered and Authorized by:   Marga Melnick MD   Signed by:   Marga Melnick MD on 02/01/2009   Method used:   Print then Give to Patient   RxID:  [id number]  

## 2010-06-03 NOTE — Miscellaneous (Signed)
Summary: Anusol Rx  Clinical Lists Changes  Medications: Added new medication of ANUSOL-HC 25 MG SUPP (HYDROCORTISONE ACETATE) 1 at bedtime as needed for bleeding - Signed Rx of ANUSOL-HC 25 MG SUPP (HYDROCORTISONE ACETATE) 1 at bedtime as needed for bleeding;  #12 x 0;  Signed;  Entered by: Jennye Boroughs RN;  Authorized by: Hart Carwin MD;  Method used: Electronically to [facility]  [address] [id number] [address], [place], [place]  [address], Ph: [phone] or [phone], Fax: [phone]    Prescriptions: ANUSOL-HC 25 MG SUPP (HYDROCORTISONE ACETATE) 1 at bedtime as needed for bleeding  #12 x 0   Entered by:   Jennye Boroughs RN   Authorized by:   Hart Carwin MD   Signed by:   Jennye Boroughs RN on 05/30/2009   Method used:   [facility]ronically to        [facility]  [address] [id number] ([facility]ail[facility]       [address]       [place], [place]  [address]       Ph: [phone] or [phone]       Fax: [phone]   RxID:   [id number]

## 2010-06-03 NOTE — Letter (Signed)
Summary: Patient Notice- Polyp Results  [facility]  [address] [place], [place] [address]   Phone: [phone]  Fax: [phone]        June 04, 2009 MRN: [id number]    [facility] [address] [place], [place]  [address]    Dear Anthony [name],  I am pleased to inform you that the colon polyp(s) removed during your recent colonoscopy was (were) found to be benign (no cancer detected) upon pathologic examination.The polyp was adenomatous ( precancerous).  I recommend you have a repeat colonoscopy examination in 5_ years to look for recurrent polyps, as having colon polyps increases your risk for having recurrent polyps or even colon cancer in the future.  Should you develop new or worsening symptoms of abdominal pain, bowel habit changes or bleeding from the rectum or bowels, please schedule an evaluation with either your primary care physician or with me.  Additional information/recommendations:  _x_ No further action with gastroenterology is needed at this time. Please      follow-up with your primary care physician for your other healthcare      needs.  __ Please call [phone] to schedule a return visit to review your      situation.  __ Please keep your follow-up visit as already scheduled.  __ Continue treatment plan as outlined the day of your exam.  Please call us if you are having persistent problems or have questions about your condition that have not been fully answered at this time.  Sincerely,  Hart Carwin MD  This letter has been electronically signed by your physician.  Appended Document: Patient Notice- Polyp Results letter mailed 2.2.11

## 2010-06-03 NOTE — Assessment & Plan Note (Signed)
Summary: cpx/ns/kdc   Vital Signs:  Patient profile:   45 year old male Height:      67 inches Weight:      198.2 pounds Temp:     98.2 degrees F oral Resp:     14 per minute BP sitting:   104 / 68  (left arm) Cuff size:   large  Vitals Entered By: Shonna Chock (June 21, 2009 10:13 AM)   Primary Care Provider:  Chrissie Noa Dolores Ewing   History of Present Illness: Anthony [name] is here for a physical; he is essentially asymptomatic. he never filled Rxfor statin; no specific diet. He did not review Dr Gildardo Griffes book.  Allergies: 1)  ! Aleve (Naproxen Sodium)  Past History:  Past Medical History: Asthma Hyperlipidemia: NMR  2006:  LDL 157(2275/1541), TG 89, HDL 42. LDL goal = < 100, ideally < 75 Short QT with normal Calcium (X4) Nephrolithiasis, hx of X 1, Dr Retta Diones Colonic polyp, hx of 05/2009, Dr Juanda Chance  Past Surgical History: Cataract  laser extraction age 68 Inguinal herniorrhaphy Colon polypectomy 05/2009, Tubular Adenoma, Dr Juanda Chance, repeat 2016  Family History: Father: CAD,DM, PVD,HTN,CRD,glaucoma, lipids Mother: died  2 in sleep Siblings: bro chest pain; PGM DM, Pancreatic CA; M uncle MI  ? pre 47  Social History: Occupation: Banker Divorced Never Smoked Alcohol use-yes: rarely Regular exercise-no  Review of Systems  The patient denies anorexia, fever, weight loss, weight gain, vision loss, decreased hearing, hoarseness, chest pain, syncope, dyspnea on exertion, peripheral edema, headaches, hematuria, genital sores, suspicious skin lesions, depression, unusual weight change, abnormal bleeding, enlarged lymph nodes, and angioedema.         Occasional dysphagia with certain foods.Ba swallow was normal.DOE only with stairs. Resp:  Denies cough, shortness of breath, and sputum productive; Wheezing only with intercourse. No PNDyspnea; no rescue inhaler use.Marland Kitchen GI:  Denies abdominal pain, bloody stools, dark tarry stools, and indigestion; Some burning after  BMs  since colonoscopy.  Physical Exam  General:  well-nourished; alert,appropriate and cooperative throughout examination Head:  Normocephalic and atraumatic without obvious abnormalities. No apparent alopecia Eyes:  No corneal or conjunctival inflammation noted.Perrla. Funduscopic exam benign, without hemorrhages, exudates or papilledema.  Ears:  External ear exam shows no significant lesions or deformities.  Otoscopic examination reveals clear canals, tympanic membranes are intact bilaterally without bulging, retraction, inflammation or discharge. Hearing is grossly normal bilaterally. Wax L> R Nose:  External nasal examination shows no deformity or inflammation. Nasal mucosa are  dry without lesions or exudates. Mouth:  Oral mucosa and oropharynx without lesions or exudates.  Teeth in good repair. Neck:  No deformities, masses, or tenderness noted. Lungs:  Normal respiratory effort, chest expands symmetrically. Lungs are clear to auscultation, no crackles or wheezes. Heart:  Normal rate and regular rhythm. S1 and S2 normal without gallop, murmur, click, rub or other extra sounds. Abdomen:  Bowel sounds positive,abdomen soft and non-tender without masses, organomegaly or hernias noted. Rectal:  Colonoscopy in 05/2009 Genitalia:  Testes bilaterally descended without nodularity, tenderness or masses. No scrotal masses or lesions. No penis lesions or urethral discharge. Msk:  No deformity or scoliosis noted of thoracic or lumbar spine.   Pulses:  R and L carotid,radial,dorsalis pedis and posterior tibial pulses are full and equal bilaterally Extremities:  No clubbing, cyanosis, edema, or deformity noted with normal full range of motion of all joints.   Neurologic:  alert & oriented X3 and DTRs symmetrical and normal.   Skin:  Intact without suspicious lesions or  rashes Cervical Nodes:  No lymphadenopathy noted Axillary Nodes:  No palpable lymphadenopathy Inguinal Nodes:  No significant  adenopathy Psych:  memory intact for recent and remote, normally interactive, and good eye contact.     Impression & Recommendations:  Problem # 1:  PREVENTIVE HEALTH CARE (ICD-V70.0)  Orders: EKG w/ Interpretation (93000)  Problem # 2:  HYPERLIPIDEMIA (ICD-272.4)  Orders: EKG w/ Interpretation (93000)  Problem # 3:  ASTHMA (ICD-493.90)  Quiescent His updated medication list for this problem includes:    Advair Diskus 100-50 Mcg/dose Misc (Fluticasone-salmeterol) .Marland Kitchen... 1 inhalation eveery 12 hrs    Ventolin Hfa 108 (90 Base) Mcg/act Aers (Albuterol sulfate) .Marland Kitchen... 1-2 puffs q 4 -6 hrs prn  Orders: EKG w/ Interpretation (93000)  Problem # 4:  ATHEROSCLEROTIC HEART DISEASE, FAMILY HX (ICD-V17.3)  Orders: EKG w/ Interpretation (93000)  Complete Medication List: 1)  Advair Diskus 100-50 Mcg/dose Misc (Fluticasone-salmeterol) .Marland Kitchen.. 1 inhalation eveery 12 hrs 2)  Ventolin Hfa 108 (90 Base) Mcg/act Aers (Albuterol sulfate) .Marland Kitchen.. 1-2 puffs q 4 -6 hrs prn 3)  Miralax Powd (Polyethylene glycol 3350) .... As per prep  instructions. 4)  Dulcolax 5 Mg Tbec (Bisacodyl) .... Day before procedure take 2 at 3pm and 2 at 8pm. 5)  Reglan 10 Mg Tabs (Metoclopramide hcl) .... As per prep instructions. 6)  Anusol-hc 25 Mg Supp (Hydrocortisone acetate) .Marland Kitchen.. 1 at bedtime as needed for bleeding  Patient Instructions: 1)  Your LDL goal = < 100, ideally < 70. Review Dr Gildardo Griffes book Eat, Drink & Be Healthy. Consume LESS THAN 40 grams of sugar / day from foods & drinks with High Fructose Corn Syrup  as #1,2 , or # 3 on label.Schedule fasting labs: 2)  BMP ; 3)  Hepatic Panel ; 4)  Lipid Panel ; 5)  TSH ; 6)  CBC w/ Diff; 7)  HbgA1C.

## 2010-06-03 NOTE — Assessment & Plan Note (Signed)
Summary: CPX//ALJ   Vital Signs:  Patient Profile:   45 Years Old Male Height:     67 inches Weight:      187.6 pounds Temp:     97.8 degrees F oral Pulse rate:   76 / minute Resp:     15 per minute BP sitting:   118 / 70  (left arm) Cuff size:   large  Vitals Entered By: Shonna Chock (February 16, 2008 4:01 PM)             Comments SUPPOSE TO TAKE ADVIAR     Chief Complaint:  CPX and WILL NEED ORDER FOR FASTING LABS-NEAR FUTURE.  History of Present Illness: Dyspnea with season change & with sex.   Lipid Management History:      Negative NCEP/ATP III risk factors include male age less than 82 years old, non-diabetic, no family history for ischemic heart disease, non-tobacco-user status, non-hypertensive, no ASHD (atherosclerotic heart disease), no prior stroke/TIA, no peripheral vascular disease, and no history of aortic aneurysm.       Current Allergies (reviewed today): ! 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 normal Ca++(X4)    Nephrolithiasis, hx of, Dr Retta Diones  Past Surgical History:    Cataract  laser extraction age 67    Inguinal herniorrhaphy   Family History:    Father: CAD,DM,HTN,CRD,glaucoma, lipids    Mother: d 5 in sleep    Siblings: bro chest pain  Social History:    Occupation: Banker    Divorced    Never Smoked    Alcohol use-yes    Regular exercise-no   Risk Factors:  Tobacco use:  never Alcohol use:  yes    Type:  socially Exercise:  no  Family History Risk Factors:    Family History of MI in females < 22 years old:  no    Family History of MI in males < 48 years old:  no   Review of Systems  General      Complains of sleep disorder.      Denies chills, fatigue, fever, sweats, and weight loss.      Shift work related insomnia  Eyes      Denies blurring, double vision, and vision loss-both eyes.  ENT      Denies decreased hearing, earache, hoarseness,  nasal congestion, ringing in ears, and sinus pressure.      Ba swallow was done 2008 for dysphagia with rice  CV      Denies bluish discoloration of lips or nails, chest pain or discomfort, difficulty breathing at night, fainting, leg cramps with exertion, lightheadness, palpitations, shortness of breath with exertion, swelling of feet, and swelling of hands.  Resp      See HPI      Denies cough, excessive snoring, hypersomnolence, morning headaches, and sputum productive.      Asthma controlled  GI      Denies abdominal pain, bloody stools, change in bowel habits, constipation, dark tarry stools, diarrhea, indigestion, nausea, and vomiting.  GU      Denies discharge, hematuria, and nocturia.  MS      Denies joint pain, joint redness, joint swelling, low back pain, mid back pain, muscle aches, muscle weakness, and thoracic pain.      Pinched nerve in spine as teen  Derm      Complains of rash.      Denies changes in  nail beds, dryness, hair loss, and lesion(s).      Inguinal rash  Neuro      Denies disturbances in coordination, headaches, numbness, poor balance, and tingling.  Psych      Denies anxiety and depression.  Endo      Denies cold intolerance, excessive hunger, excessive thirst, excessive urination, heat intolerance, polyuria, and weight change.  Heme      Denies abnormal bruising and bleeding.  Allergy      Denies itching eyes and sneezing.   Physical Exam  General:     well-nourished,in no acute distress; alert,appropriate and cooperative throughout examination Head:     Normocephalic and atraumatic without obvious abnormalities. Goatee Eyes:     No corneal or conjunctival inflammation noted.Perrla. Funduscopic exam benign, without hemorrhages, exudates or papilledema. Ears:     External ear exam shows no significant lesions or deformities.  Otoscopic examination reveals clear canals, tympanic membranes are intact bilaterally without bulging, retraction,  inflammation or discharge. Hearing is grossly normal bilaterally. Nose:     External nasal examination shows no deformity or inflammation. Nasal mucosa are pink and moist without lesions or exudates.Polyp L nare Mouth:     Oral mucosa and oropharynx without lesions or exudates.  Teeth in good repair. Neck:     No deformities, masses, or tenderness noted. Lungs:     Normal respiratory effort, chest expands symmetrically. Lungs are clear to auscultation, no crackles or wheezes. Heart:     Normal rate and regular rhythm. S1 and S2 normal without gallop, murmur, click, rub or other extra sounds. Abdomen:     Bowel sounds positive,abdomen soft and non-tender without masses, organomegaly or hernias noted. Rectal:     No external abnormalities noted. Normal sphincter tone. No rectal masses or tenderness. Genitalia:     Testes bilaterally descended without nodularity, tenderness or masses. No scrotal masses or lesions. No  urethral discharge. Tiny mucocoele on glans ("unchanged since  Prostate:     Prostate gland firm and smooth,upper normal size, no  nodularity, tenderness, mass, asymmetry or induration. Msk:     No deformity or scoliosis noted of thoracic or lumbar spine.   Pulses:     R and L carotid,radial,dorsalis pedis and posterior tibial pulses are full and equal bilaterally Extremities:     No clubbing, cyanosis, edema, or deformity noted with normal full range of motion of all joints.  Crepitus of knees Neurologic:     alert & oriented X3 and DTRs symmetrical and normal.   Skin:     Intact without suspicious lesions or rashes Cervical Nodes:     No lymphadenopathy noted Axillary Nodes:     No palpable lymphadenopathy Psych:     memory intact for recent and remote, normally interactive, and good eye contact.      Impression & Recommendations:  Problem # 1:  ROUTINE GENERAL MEDICAL EXAM@HEALTH  CARE FACL (ICD-V70.0)  Orders: EKG w/ Interpretation (93000)   Problem # 2:   HYPERLIPIDEMIA (ICD-272.4)  Problem # 3:  ASTHMA (ICD-493.90)  His updated medication list for this problem includes:    Advair Diskus 100-50 Mcg/dose Misc (Fluticasone-salmeterol) .Marland Kitchen... 1 inhalation eveery 12 hrs    Ventolin Hfa 108 (90 Base) Mcg/act Aers (Albuterol sulfate) .Marland Kitchen... 1-2 puffs q 4 -6 hrs prn   Problem # 4:  NONSPECIFIC ABNORMAL ELECTROCARDIOGRAM (ICD-794.31) Short QT  Orders: EKG w/ Interpretation (93000)   Problem # 5:  NEPHROLITHIASIS, HX OF (ICD-V13.01)  Complete Medication List: 1)  Advair Diskus  100-50 Mcg/dose Misc (Fluticasone-salmeterol) .Marland Kitchen.. 1 inhalation eveery 12 hrs 2)  Ventolin Hfa 108 (90 Base) Mcg/act Aers (Albuterol sulfate) .Marland Kitchen.. 1-2 puffs q 4 -6 hrs prn  Lipid Assessment/Plan:      Based on NCEP/ATP III, the patient's risk factor category is "0-1 risk factors".  The patient's lipid goals have been set as follows: Total cholesterol goal is 200; LDL cholesterol goal is 100; HDL cholesterol goal is 40; Triglyceride goal is 150.     Patient Instructions: 1)  Codes for fasting labs: V70.0,272.4 2)  BMP prior to visit, ICD-9: 3)  Hepatic Panel prior to visit, ICD-9: 4)  Lipid Panel prior to visit, ICD-9: 5)  TSH prior to visit, ICD-9: 6)  CBC w/ Diff prior to visit, ICD-9: Your LDL goal = < 100.   Prescriptions: VENTOLIN HFA 108 (90 BASE) MCG/ACT AERS (ALBUTEROL SULFATE) 1-2 puffs q 4 -6 hrs prn  #1 x 2   Entered and Authorized by:   Marga Melnick MD   Signed by:   Marga Melnick MD on 02/16/2008   Method used:   Print then Give to Patient   RxID:   [id number] ADVAIR DISKUS 100-50 MCG/DOSE MISC (FLUTICASONE-SALMETEROL) 1 inhalation eveery 12 hrs  #1 x 11   Entered and Authorized by:   Marga Melnick MD   Signed by:   Marga Melnick MD on 02/16/2008   Method used:   Print then Give to Patient   RxID:   [id number]  ]

## 2010-06-03 NOTE — Letter (Signed)
Summary: Work Dietitian at [employer]  [address] [place], [place] [address]   Phone: [phone]  Fax: [phone]    Today's Date: February 01, 2009  Name of Patient: Anthony [name]  The above named patient had a medical visit today at:  am /12:00pm.  Please take this into consideration when reviewing the time away from work/school.    Special Instructions:  [  ] None  Arly.Keller  ] To be off the remainder of today, returning to the normal work / school schedule tomorrow.  [  ] To be off until the next scheduled appointment on ______________________.  [  ] Other ________________________________________________________________ ________________________________________________________________________   Sincerely yours,   Shonna Chock

## 2010-06-03 NOTE — Progress Notes (Signed)
Summary: cough  Phone Note Call from Patient   Caller: Patient Summary of Call: pt said has white bump under tongue saw oral surgeon want to biopsy, also having some coughing and when swallowing r ear hurts.  OV SCHEDULED Initial call taken by: Kandice Hams,  August 02, 2008 9:48 AM

## 2010-06-03 NOTE — Progress Notes (Signed)
Summary: WANTS SECOND REFERRAL  Phone Note Call from Patient Call back at Home Phone [phone]   Caller: Patient Summary of Call: WOULD LIKE REFERRAL TO SECOND EAR, NOSE AND THROAT---SAW DR Pollyann Kennedy ON MONDAY OF THIS WEEK---HE SAID HE ACTUALLY FELT OK AND DR Pollyann Kennedy DID NOT SEE ANYTHING----PATIENT WENT BACK ON THURSDAY WHEN IT FELT WORSE --DR ROSEN STILL DID NOT SEE ANYTHING----SAYS HE THINKS IT IS TRIGGERED BY [employer] AND HE HAD [employer] MONDAY NIGHT  PATIENT WOULD LIKE TO GO SEE ANOTHER EAR, NOSE AND THROAT DOCTOR----SAID IT IS A SMALL WHITE SPOT ON UNDER SIDE OF TONGUE ON RIGHT  ( AS HE WAS WALKING OUT, HE SAID "THE ORAL SURGEON WANTED TO DO A BIOPSY...." ) , BUT THE PATIENT DID NOT ELABORATE ON THIS SENTENCE  Initial call taken by: Jerolyn Shin,  August 17, 2008 9:28 AM  Follow-up for Phone Call        DR.Cornelia Walraven PLEASE ADVISE. DO YOU AGREE WITH PATIENTS REQUEST TO SEE ANOTHER ENT. PATIENT WAS GIVEN MOUTHWASH BY ORAL SURGEON FOR SPOT. Follow-up by: Shonna Chock,  August 17, 2008 5:00 PM  Additional Follow-up for Phone Call Additional follow up Details #1::        See if Oral Surgeon on his plan to assess the lingual lesion Additional Follow-up by: Marga Melnick MD,  August 17, 2008 6:27 PM

## 2010-06-03 NOTE — Progress Notes (Signed)
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 He needs someone to go over his prep instructions with him again . I resch his procedure for him to 05-30-2008, but the modified instructions are not in EMR. I advised patietn that he is subject to the $100 charge for cx the day before the procedure.  Initial call taken by: Harlow Mares CMA (AAMA),  April 03, 2009 9:07 AM  Follow-up for Phone Call        Explained instructions t o pt with new date and times.  Let him know I put new instrctions at our front desk.  Follow-up by: Joselyn Glassman,  April 04, 2009 9:32 AM

## 2010-06-03 NOTE — Letter (Signed)
Summary: Results Follow up Letter  [facility] [place]  [address] [place], [place] [address]   Phone: [phone]  Fax: [phone]    08/22/2008 MRN: [id number]  [facility] [address] [place], [place]  [address]  Dear Mr. Holdren,  The following are the results of your recent test(s):  Test         Result    Pap Smear:        Normal _____  Not Normal _____ Comments: ______________________________________________________ Cholesterol: LDL(Bad cholesterol):         Your goal is less than:         HDL (Good cholesterol):       Your goal is more than: Comments:  ______________________________________________________ Mammogram:        Normal _____  Not Normal _____ Comments:  ___________________________________________________________________ Hemoccult:        Normal _____  Not normal _______ Comments:    _____________________________________________________________________ Other Tests: PLEASE SEE ATTACHED LABS DONE ON 08/17/2008    We routinely do not discuss normal results over the telephone.  If you desire a copy of the results, or you have any questions about this information we can discuss them at your next office visit.   Sincerely,

## 2010-06-03 NOTE — Progress Notes (Signed)
  Phone Note Outgoing Call   Summary of Call: Could you please change pt's apt at [facility] from tomorrow to thursday or friday of this week with Dr. Hopper(preferably) and also notify him that he is scheduled to see Mike Gip PA at [facility] gastroenterology  tomorrow at 9:30 AM ([address])  [phone]. Thanks  Follow-up for Phone Call        CALLED PATIENT LEFT MESSAGE ABOUT APPT AND FOR HIM TO CALL TO LET ME [facility] APPT WITH HOPPER  Roselle Locus  March 19, 2009 1:47 PM

## 2010-06-03 NOTE — Letter (Signed)
Summary: Results Follow up Letter  [facility]  [address] [place], [place] [address]   Phone: [phone]  Fax: [phone]    02/04/2009 MRN: [id number]  [facility] [address] [place], [place]  [address]  Dear Anthony [name],  The following are the results of your recent test(s):  Test         Result    Pap Smear:        Normal _____  Not Normal _____ Comments: ______________________________________________________ Cholesterol: LDL(Bad cholesterol):         Your goal is less than:         HDL (Good cholesterol):       Your goal is more than: Comments:  ______________________________________________________ Mammogram:        Normal _____  Not Normal _____ Comments:  ___________________________________________________________________ Hemoccult:        Normal _____  Not normal _______ Comments:    _____________________________________________________________________ Other Tests: Please see attached Chest X-Ray done on 02/01/2009    We routinely do not discuss normal results over the telephone.  If you desire a copy of the results, or you have any questions about this information we can discuss them at your next office visit.   Sincerely,

## 2010-06-03 NOTE — Assessment & Plan Note (Signed)
Summary: acute/cough ear hurts/alr   Vital Signs:  Patient profile:   45 year old male Weight:      194.6 pounds BMI:     30.59 Temp:     98.0 degrees F oral Pulse rate:   80 / minute Resp:     17 per minute BP sitting:   116 / 74  (left arm) Cuff size:   large  Vitals Entered By: Shonna Chock (August 02, 2008 2:41 PM) Comments Cough(productive @ times), spot under tounge causing mouth pain, right ear pain, drainage, scratchy throat, watery eyes. Patient seen oral surgeon yesterday, patient was given Rx for Mouthwash and Pain med. RX for Pravachol and Albuterol needed. Patient never started Pravachol   History of Present Illness: He was seen @ [facility] 3 weeks ago for painful tongue lesion ; Rx: Magic mouthwash with improvement initially.Recurrence of "spot" under tongue, ? due to hot ,spicey foods. He had NP cough until this week; now  with green sputum of tsp/day. He saw Oral Surgeon for tongue lesion 08/01/2008; biopsy recommended but he is not on plan. No PMH of tobacco use.  Allergies: 1)  ! Aleve (Naproxen Sodium)  Review of Systems General:  Denies chills, fever, and sweats. ENT:  Complains of postnasal drainage; denies difficulty swallowing, earache, hoarseness, nasal congestion, and sinus pressure. Resp:  Denies coughing up blood, shortness of breath, and wheezing.  Physical Exam  General:  well-nourished,in no acute distress; alert,appropriate and cooperative throughout examination Ears:  External ear exam shows no significant lesions or deformities.  Otoscopic examination reveals clear  R TM; wax on L Nose:  External nasal examination shows no deformity or inflammation. Nasal mucosa are erythematous without lesions or exudates. Mouth:  Oral mucosa and oropharynx : white papule R lateral tongue.  Teeth in good repair. Neck:  No deformities, masses, or tenderness noted. Lungs:  Normal respiratory effort, chest expands symmetrically. Lungs are clear to auscultation, no  crackles or wheezes. Skin:  Intact without suspicious lesions or rashes Cervical Nodes:  No lymphadenopathy noted Axillary Nodes:  No palpable lymphadenopathy   Impression & Recommendations:  Problem # 1:  SWELLING MASS OR LUMP IN HEAD AND NECK (ICD-784.2)  papular lesion R tongue  Orders: Misc. Referral (Misc. Ref)  Problem # 2:  BRONCHITIS-ACUTE (ICD-466.0) Assessment: Unchanged  His updated medication list for this problem includes:    Advair Diskus 100-50 Mcg/dose Misc (Fluticasone-salmeterol) .Marland Kitchen... 1 inhalation eveery 12 hrs    Ventolin Hfa 108 (90 Base) Mcg/act Aers (Albuterol sulfate) .Marland Kitchen... 1-2 puffs q 4 -6 hrs prn    Amoxicillin-pot Clavulanate 875-125 Mg Tabs (Amoxicillin-pot clavulanate) .Marland Kitchen... 1 q 12 hrs with a meal    Promethazine-codeine 6.25-10 Mg/68ml Syrp (Promethazine-codeine) .Marland Kitchen... 1 tsp q 6 hrs as needed  Complete Medication List: 1)  Advair Diskus 100-50 Mcg/dose Misc (Fluticasone-salmeterol) .Marland Kitchen.. 1 inhalation eveery 12 hrs 2)  Ventolin Hfa 108 (90 Base) Mcg/act Aers (Albuterol sulfate) .Marland Kitchen.. 1-2 puffs q 4 -6 hrs prn 3)  Pravastatin Sodium 40 Mg Tabs (Pravastatin sodium) .Marland Kitchen.. 1 qhs 4)  Amoxicillin-pot Clavulanate 875-125 Mg Tabs (Amoxicillin-pot clavulanate) .Marland Kitchen.. 1 q 12 hrs with a meal 5)  Promethazine-codeine 6.25-10 Mg/61ml Syrp (Promethazine-codeine) .Marland Kitchen.. 1 tsp q 6 hrs as needed  Patient Instructions: 1)  Drink as much fluid as you can tolerate for the next few days. Prescriptions: PROMETHAZINE-CODEINE 6.25-10 MG/5ML SYRP (PROMETHAZINE-CODEINE) 1 tsp q 6 hrs as needed  #120cc x 0   Entered and Authorized by:   Chrissie Noa  Arsenia Goracke MD   Signed by:   Marga Melnick MD on 08/02/2008   Method used:   Print then Give to Patient   RxID:   [id number] AMOXICILLIN-POT CLAVULANATE 875-125 MG TABS (AMOXICILLIN-POT CLAVULANATE) 1 q 12 hrs with a meal  #20 x 0   Entered and Authorized by:   Marga Melnick MD   Signed by:   Marga Melnick MD on 08/02/2008   Method used:    Print then Give to Patient   RxID:   [id number]

## 2010-06-03 NOTE — Assessment & Plan Note (Signed)
Summary: ASTHMA/RH...........Marland Kitchen   Vital Signs:  Patient profile:   45 year old male Weight:      202.6 pounds Temp:     98.0 degrees F oral Pulse rate:   88 / minute Resp:     18 per minute BP sitting:   122 / 78  (left arm) Cuff size:   large  Vitals Entered By: Shonna Chock (July 03, 2009 2:00 PM) CC: 1.) Asthma   2.) Follow-up on Labs (copy given), Lipid Management Comments REVIEWED MED LIST, PATIENT AGREED DOSE AND INSTRUCTION CORRECT    Primary Care Provider:  Chrissie Noa Nickolaos Brallier  CC:  1.) Asthma   2.) Follow-up on Labs (copy given) and Lipid Management.  History of Present Illness: Asthma flare  with PNDysnea , SOB & wheezing as of 06/29/2009, trigger ? from weather change despite Advair  & Ventolin HFA. Using Ventolin bid - tid  & Advair bid.That night he drank 4 shots ; N&V the next am X 4-5.Some LBP since N&V episode. SOB persists. Some positional SOB with waist flexion.      Also labs reviewed : all WNL except for LDL . NMR Lipoprofile 2006 reviewed & risks discussed . LDL goal = < 100, ideally < 75. ? Premature MI  but M uncle had MI in 20s & mother died in sleep @ 2.  No specific diet; no regular CVE.                                    Lipid Management History:      Positive NCEP/ATP III risk factors include family history for ischemic heart disease (females less than 68 years old & males less than 25 years old).  Negative NCEP/ATP III risk factors include male age less than 46 years old, non-diabetic, non-tobacco-user status, non-hypertensive, no ASHD (atherosclerotic heart disease), no prior stroke/TIA, no peripheral vascular disease, and no history of aortic aneurysm.     Allergies: 1)  ! Aleve (Naproxen Sodium)  Review of Systems General:  Denies chills, fever, and sweats. ENT:  Denies nasal congestion and sinus pressure; No frontral headache , facial pain or purulence. Resp:  Denies chest pain with inspiration, cough, coughing up blood, and sputum productive. Allergy:   Denies itching eyes and sneezing.  Physical Exam  General:  well-nourished,in no acute distress; alert,appropriate and cooperative throughout examination Ears:  External ear exam shows no significant lesions or deformities.  Otoscopic examination reveals clear canals, tympanic membranes are intact bilaterally without bulging, retraction, inflammation or discharge. Hearing is grossly normal bilaterally. Nose:  External nasal examination shows no deformity or inflammation. Nasal mucosa are dry without lesions or exudates. Mouth:  Oral mucosa and oropharynx without lesions or exudates.  Teeth in good repair. Lungs:  Normal respiratory effort, chest expands symmetrically. Lungs are clear to auscultation, no crackles or wheezes. Heart:  Normal rate and regular rhythm. S1 and S2 normal without gallop, murmur, click, rub. S4 Skin:  Intact without suspicious lesions or rashes Cervical Nodes:  No lymphadenopathy noted Axillary Nodes:  No palpable lymphadenopathy    Impression & Recommendations:  Problem # 1:  ASTHMA NOS W/ACUTE EXACERBATION (ICD-493.92)  The following medications were removed from the medication list:    Advair Diskus 100-50 Mcg/dose Misc (Fluticasone-salmeterol) .Marland Kitchen... 1 inhalation eveery 12 hrs His updated medication list for this problem includes:    Ventolin Hfa 108 (90 Base) Mcg/act Aers (Albuterol sulfate) .Marland KitchenMarland KitchenMarland KitchenMarland Kitchen  1-2 puffs q 4 -6 hrs prn    Symbicort 160-4.5 Mcg/act Aero (Budesonide-formoterol fumarate) .Marland Kitchen... 1-2 puffs every 12 hrs ; gargle well & spit  Problem # 2:  HYPERLIPIDEMIA (ICD-272.4)  Problem # 3:  ATHEROSCLEROTIC HEART DISEASE, FAMILY HX (ICD-V17.3) ? premature MI in mother & M uncle  Complete Medication List: 1)  Ventolin Hfa 108 (90 Base) Mcg/act Aers (Albuterol sulfate) .Marland Kitchen.. 1-2 puffs q 4 -6 hrs prn 2)  Anusol-hc 25 Mg Supp (Hydrocortisone acetate) .Marland Kitchen.. 1 at bedtime as needed for bleeding 3)  Symbicort 160-4.5 Mcg/act Aero (Budesonide-formoterol fumarate)  .Marland Kitchen.. 1-2 puffs every 12 hrs ; gargle well & spit  Lipid Assessment/Plan:      Based on NCEP/ATP III, the patient's risk factor category is "0-1 risk factors".  The patient's lipid goals are as follows: Total cholesterol goal is 200; LDL cholesterol goal is 100; HDL cholesterol goal is 40; Triglyceride goal is 150.  His LDL cholesterol goal has not been met.  Secondary causes for hyperlipidemia have been ruled out.  He has been counseled on adjunctive measures for lowering his cholesterol and has been provided with dietary instructions.    Patient Instructions: 1)  Review Dr Gildardo Griffes book Eat, Drink & Be Healthy for dietary  cholesterol information . 2)  It is important that you exercise regularly at least 20 minutes 5 times a week. If you develop chest pain, have severe difficulty breathing, or feel very tired , stop exercising immediately and seek medical attention. 3)  Take a 325 mg coated  Aspirin every day. 4)  Please schedule a follow-up fasting Lab  appointment in 6 months. 5)  Lipid Panel prior to visit, ICD-9:272.4. Your LDL goal = < 100 based on NMR Lipoprofile. Meds should be considered if not @ goal then.PT referral if back persists with Soma sample at bedtime  Prescriptions: SYMBICORT 160-4.5 MCG/ACT AERO (BUDESONIDE-FORMOTEROL FUMARATE) 1-2 puffs every 12 hrs ; gargle well & spit  #1 x 11   Entered and Authorized by:   Marga Melnick MD   Signed by:   Marga Melnick MD on 07/03/2009   Method used:   Samples Given   RxID:   [id number]

## 2010-06-03 NOTE — Letter (Signed)
Summary: [facility] Instructions  [name]  [address] [place], [place] [address]   Phone: [phone]  Fax: [phone]       Anthony [name]    29-Mar-1966    MRN: [id number]       Procedure Day /Date:05-30-2009     Arrival Time: 7:30AM     Procedure Time:8:00 AM     Location of Procedure:                    X       [facility] (4th Floor)  PREPARATION FOR COLONOSCOPY WITH MIRALAX  Starting 5 days prior to your procedure 05-25-2009 do not eat nuts, seeds, popcorn, corn, beans, peas,  salads, or any raw vegetables.  Do not take any fiber supplements (e.g. Metamucil, Citrucel, and Benefiber). ____________________________________________________________________________________________________   THE DAY BEFORE YOUR PROCEDURE         DATE:05-29-08 DAY: Wednesday  1   Drink clear liquids the entire day-NO SOLID FOOD  2   Do not drink anything colored red or purple.  Avoid juices with pulp.  No orange juice.  3   Drink at least 64 oz. (8 glasses) of fluid/clear liquids during the day to prevent dehydration and help the prep work efficiently.  CLEAR LIQUIDS INCLUDE: Water Jello Ice Popsicles Tea (sugar ok, no milk/cream) Powdered fruit flavored drinks Coffee (sugar ok, no milk/cream) Gatorade Juice: apple, white grape, white cranberry  Lemonade Clear bullion, consomm, broth Carbonated beverages (any kind) Strained chicken noodle soup Hard Candy  4   Mix the entire bottle of Miralax with 64 oz. of Gatorade/Powerade in the morning and put in the refrigerator to chill.  5   At 3:00 pm take 2 Dulcolax/Bisacodyl tablets.  6   At 4:30 pm take one Reglan/Metoclopramide tablet.  7  Starting at 5:00 pm drink one 8 oz glass of the Miralax mixture every 15-20 minutes until you have finished drinking the entire 64 oz.  You should finish drinking prep around 7:30 or 8:00 pm.  8   If you are nauseated, you may take the 2nd Reglan/Metoclopramide tablet at  6:30 pm.        9    At 8:00 pm take 2 more DULCOLAX/Bisacodyl tablets.     THE DAY OF YOUR PROCEDURE      DATE:  05-30-08 DAY: Thursday  You may drink clear liquids until 6:00 AM  (2 HOURS BEFORE PROCEDURE).   MEDICATION INSTRUCTIONS   Unless otherwise instructed, you should take regular prescription medications with a small sip of water as early as possible the morning of your procedure.         OTHER INSTRUCTIONS  You will need a responsible adult at least 44 years of age to accompany you and drive you home.   This person must remain in the waiting room during your procedure.  Wear loose fitting clothing that is easily removed.  Leave jewelry and other valuables at home.  However, you may wish to bring a book to read or an iPod/MP3 player to listen to music as you wait for your procedure to start.  Remove all body piercing jewelry and leave at home.  Total time from sign-in until discharge is approximately 2-3 hours.  You should go home directly after your procedure and rest.  You can resume normal activities the day after your procedure.  The day of your procedure you should not:   Drive   Make legal decisions  Operate machinery   Drink alcohol   Return to work  You will receive specific instructions about eating, activities and medications before you leave.   The above instructions have been reviewed and explained to me by   _______________________    I fully understand and can verbalize these instructions _____________________________ Date _______

## 2010-06-03 NOTE — Procedures (Signed)
Summary: Colonoscopy  Patient: [name] Note: All result statuses are Final unless otherwise noted.  Tests: (1) Colonoscopy (COL)   COL Colonoscopy           DONE     [facility]     [address]     [place], [place]  [address]           COLONOSCOPY PROCEDURE REPORT           PATIENT:  Anthony [name], Anthony [name]  MR#:  [id number]     BIRTHDATE:  January 08, 1966, 43 yrs. old  GENDER:  male           ENDOSCOPIST:  Hedwig Morton. Juanda Chance, MD     Referred by:  Marga Melnick, M.D.           PROCEDURE DATE:  05/30/2009     PROCEDURE:  Colonoscopy [id number]     ASA CLASS:  Class I     INDICATIONS:  hematochezia           MEDICATIONS:   Versed 6 mg, Fentanyl 50 mcg           DESCRIPTION OF PROCEDURE:   After the risks benefits and     alternatives of the procedure were thoroughly explained, informed     consent was obtained.  Digital rectal exam was performed and     revealed no rectal masses.   The LB CF-[id number] endoscope     was introduced through the anus and advanced to the cecum, which     was identified by both the appendix and ileocecal valve, without     limitations.  The quality of the prep was good, using MiraLax.     The instrument was then slowly withdrawn as the colon was fully     examined.     <<PROCEDUREIMAGES>>           FINDINGS:  A sessile polyp was found in the sigmoid colon. 4 mm     sessdile polyp removed from 25 cm The polyp was removed using cold     biopsy forceps (see image1).  Internal hemorrhoids were found (see     image5). prominent hemorrhoidal veins, no active bleeding  This     was otherwise a normal examination of the colon (see image4,     image3, and image2).   Retroflexed views in the rectum revealed no     abnormalities.    The scope was then withdrawn from the patient     and the procedure completed.           COMPLICATIONS:  None           ENDOSCOPIC IMPRESSION:     1) Sessile polyp in the sigmoid colon     2) Internal hemorrhoids     3)  Otherwise normal examination     small non bleeding hems, likely source of hematochezia     RECOMMENDATIONS:     1) high fiber diet     Anusol HC supp # 12, 1 qhs prn bleeding           REPEAT EXAM:  In 10 year(s) for.           ______________________________     Hedwig Morton. Juanda Chance, MD           CC:           n.     eSIGNED:   Hedwig Morton. Bostyn Bogie at 05/30/2009 08:30 AM  Anthony [name], Anthony [name], [id number]  Note: An exclamation mark (!) indicates a result that was not dispersed into the flowsheet. Document Creation Date: 05/30/2009 8:31 AM _______________________________________________________________________  (1) Order result status: Final Collection or observation date-time: 05/30/2009 08:20 Requested date-time:  Receipt date-time:  Reported date-time:  Referring Physician:   Ordering Physician: Lina Sar [phone]) Specimen Source:  Source: Launa Grill Order Number: [id number] Lab site:   Appended Document: Colonoscopy     Colonoscopy  Procedure date:  05/30/2009  Findings:      Location:  [facility].    Procedures Next Due Date:    Colonoscopy: 06/2019  Appended Document: Colonoscopy     Procedures Next Due Date:    Colonoscopy: 06/2014

## 2010-09-19 NOTE — Op Note (Signed)
Anthony [name], Anthony [name]                ACCOUNT NO.:  [id number]   MEDICAL RECORD NO.:  [id number]          PATIENT TYPE:  AMB   LOCATION:  [facility]                         FACILITY:  [facility]   PHYSICIAN:  Bertram Millard. Dahlstedt, M.D.DATE OF BIRTH:  02-27-1966   DATE OF PROCEDURE:  03/10/2006  DATE OF DISCHARGE:                                 OPERATIVE REPORT   PREOPERATIVE DIAGNOSIS:  1. Bilateral flank pain.  2. Probable left ureteral stone.   POSTOPERATIVE DIAGNOSIS:  1. Normal right ureter.  2. Left ureteral stone.   SURGICAL PROCEDURES:  1. Cystoscopy.  2. Bilateral retrograde ureteral pyelograms.  3. Left ureteroscopy with stone extraction.   SURGEON:  Bertram Millard. Dahlstedt, M.D.   ANESTHESIA:  General with LMA.   COMPLICATIONS:  None.   BRIEF HISTORY:  This 45 year old gentleman has been seeing me for quite a  few months.  I first saw him about 4 months ago.  At that time he had a 5 mm  left upper ureteral stone.  He has seen me intermittently since that time,  with no stone passage, but minimal pain.  I saw him recently.  He has had  some intermittent twinges of pain on the left.  A CT urogram was repeated at  [facility].  No stone was seen, but on my evaluation of the  films, he did have a left distal ureteral stone.  There was no hydro.   He has also had intermittent right flank pain.  Due to his history of  urolithiasis, I have recommended that we proceed with left ureteroscopy, and  bilateral retrogrades to rule out right-sided stone.  He agrees with this  procedure.  He understands the risks and complications which include injury  to the ureter, infection, bleeding, pain, inability to access the stone.  He  desires to proceed.   DESCRIPTION OF PROCEDURE:  The patient was administered preoperative IV  antibiotics, identified in the holding area, and the surgical side was  marked.  He was taken to the operating room where general anesthetic was  administered  using the LMA.  He was placed in the dorsal lithotomy position.  Genitalia and perineum were prepped and draped.  A 22-French panendoscope  was advanced through his urethra.  Urethra was normal, prostate  nonobstructive.  The bladder was entered and inspected circumferentially.  No tumors, trabeculations, or foreign bodies.   The right ureter was cannulated.  Retrograde was performed.  Nonionic  contrast was used.  The column along the ureter was normal.  No filling  defects were seen.  Pyelocaliceal system on that side was normal.   The left, another retrograde was performed.  The ureter was somewhat dilated  above a filling defect in the distal ureter.  The pyelocaliceal system was  normal.  A guidewire was advanced up into the left ureter, and the distal  ureter was dilated with an access sheath.  Following this, the 6-French  short ureteroscope was placed.  About a third of the way up the ureter, a  stone was encountered.  It was grasped and extracted with  the nitinol  basket.  No other calculi were seen.   At this point, the guidewire was removed.  The bladder was drained and the  procedure terminated.  He was given IV Toradol.  The patient tolerated the  procedure well.  He was awakened and taken to PACU in stable condition.  He  will be discharged on Bactrim DS 1 p.o. b.i.d. for 3 days, Vicodin 1-2 p.o.  q.4 h. p.r.n. pain (#20); and will follow up in approximately 8 days in the  office.      Bertram Millard. Dahlstedt, M.D.  Electronically Signed     SMD/MEDQ  D:  03/10/2006  T:  03/10/2006  Job:  [id number]   cc:   Titus Dubin. Alwyn Ren, MD,FACP,FCCP  [phone] [place]  [place] [address]

## 2010-09-19 NOTE — Assessment & Plan Note (Signed)
[facility]                        [place] OFFICE NOTE   SHAUGHN, THOMLEY                       MRN:          [id number]  DATE:08/05/2006                            DOB:          04-28-1966    Mr. Halbig was seen for comprehensive physical examination August 05, 2006  at the age of 45.   He has multiple concerns. His major concern is poor sleep hygiene. 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 He will go to sleep at  7:30 to 8 a.m. and sleep until approximately 11:30. He will then awaken  and essentially stay in bed until he dozing off late in the afternoon.  At work he will be profoundly sleepy and will try to take 5 minute naps  on the bench at breaks. On 2 occasions he has awakened to find his right  lower extremity was numb suggesting a compressive neuropathy.   Additionally he has asthma which has been fairly well controlled with  Advair 150/50 every 12 hours. He only uses an albuterol approximately  twice a month. He denies any nocturnal dyspnea. 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 He does take Claritin with some benefit.   He had laser surgery on the left eye at age 79 for a cataract. He is  unsure why he had cataract at such a young age. He had a hernia repair  in 1990 on the right side. He has also had circumcision.   OTHER MEDICAL PROBLEMS:  Include; dyslipidemia with significant risk  based on prior MMR LipoProfile. His longterm risk would be over 20% with  the LDL at 157 as noted in July of 2006.   FAMILY HISTORY:  Positive for diabetes, heart attack, stroke. The only  new history is diabetes in his father. He mother had sudden death at age  8. There is an especially strong family history of diabetes in the  paternal side of the family.   Maternal grandmother had cancer of unknown primary.   Hypertension is present in both sides of the family.   He has never smoked and drinks occasionally. He is not exercising  regularly.   REVIEW OF SYSTEMS:  Otherwise negative except for mild heartburn for  which he does not take medications.   He has also had a rash for which he was in urgent care given pills. He  does have the extensive rhinoconjunctivitis for which he takes the  Claritin.   He is presently on Advair, albuterol. HE QUESTIONS INTOLERANCE TO ALEVE.   Weight is down 2 pounds to 189, pulse is 64, respiratory rate 14, blood  pressure 120/62. The sclerae are slightly injected. Fundal exam is  unremarkable. Nasal polyps are suggested. Otic canals are clear. Dental  hygiene is excellent.   He has no lymphadenopathy of the head, neck, or axilla.   Thyroid is normal to palpation.   CHEST: Clear this time with no wheezing.   He has an S4 with no murmur. All pulses are intact and there is no  edema.   He has no organomegaly or masses.   There is slight weakness on the left inguinal area but no hernia.   There is hyperpigmentation over the right medial thigh.   Prostate upper limits and normal. Hemoccult testing is negative.   There are no neuro/psychiatric deficits.   MUSCULOSKELETAL EXAM: Unremarkable.   He does have sleep dysfunction related to shift work. I would recommend  Provigil 200 mg prior to the shift each night. He should practice his  sleep hygiene. The specifics were discussed with him. This would include  avoiding stimulants prior to going to bed. He should not read, eat, or  watch T.V. in bed. A regular exercise program would be recommended.   For the asthma with an extrinsic rhinoconjunctivitis component it is  recommended that he use Claritin daily as needed as this will be  nonsedating. Additionally the Advair could be increased to 250/50 during  those periods. If there is failure to improve then Singulair could be  added.   For the rash Nizoral topically would be recommended. He will be   encouraged to use a hair dryer to make sure this area is dry after  showering.   He plans to be evaluated at the [facility] and copies of this can be provided to  them for continuity of care.   He has dyslipidemia with a significant risk. Dr.Willet's book Eat,  Drink, and Be Healthy has been recommended.   If the LDL is not at 100 or below, then he should consider medications  such as pravastatin 40 mg qhs with repeat fasting labs after 10 weeks  while still on the medication.Titus Dubin. Alwyn Ren, MD,FACP,FCCP  Electronically Signed   [facility]  DD: 08/05/2006  DT: 08/05/2006  Job #: [id number]

## 2010-11-03 ENCOUNTER — Encounter: Payer: Self-pay | Admitting: Internal Medicine

## 2010-12-17 HISTORY — PX: SHOULDER SURGERY: SHX246

## 2010-12-19 ENCOUNTER — Telehealth: Payer: Self-pay | Admitting: Internal Medicine

## 2010-12-19 MED ORDER — BUDESONIDE-FORMOTEROL FUMARATE 160-4.5 MCG/ACT IN AERO: INHALATION_SPRAY | RESPIRATORY_TRACT | Status: DC

## 2010-12-19 MED ORDER — ALBUTEROL SULFATE HFA 108 (90 BASE) MCG/ACT IN AERS: INHALATION_SPRAY | RESPIRATORY_TRACT | Status: DC

## 2010-12-19 NOTE — Telephone Encounter (Signed)
RX's sent to the pharmacy-patient with pending appointment 01/2011

## 2010-12-19 NOTE — Telephone Encounter (Signed)
Patient wants rx for  albuterol & symbicort - Anthony [name]   - he said he had samples but didn't get rx filled

## 2011-01-19 ENCOUNTER — Encounter: Payer: Self-pay | Admitting: Internal Medicine

## 2011-01-19 ENCOUNTER — Ambulatory Visit (INDEPENDENT_AMBULATORY_CARE_PROVIDER_SITE_OTHER): Payer: Federal, State, Local not specified - PPO | Admitting: Internal Medicine

## 2011-01-19 VITALS — BP 128/76 | HR 105 | Temp 98.2°F | Resp 12 | Ht 67.5 in | Wt 199.0 lb

## 2011-01-19 DIAGNOSIS — Z0000 Encounter for general adult medical examination without abnormal findings: Secondary | ICD-10-CM

## 2011-01-19 DIAGNOSIS — Z23 Encounter for immunization: Secondary | ICD-10-CM

## 2011-01-19 DIAGNOSIS — E785 Hyperlipidemia, unspecified: Secondary | ICD-10-CM

## 2011-01-19 DIAGNOSIS — Z87442 Personal history of urinary calculi: Secondary | ICD-10-CM

## 2011-01-19 DIAGNOSIS — J45909 Unspecified asthma, uncomplicated: Secondary | ICD-10-CM

## 2011-01-19 DIAGNOSIS — Z86010 Personal history of colonic polyps: Secondary | ICD-10-CM

## 2011-01-19 MED ORDER — TETANUS-DIPHTH-ACELL PERTUSSIS 5-2.5-18.5 LF-MCG/0.5 IM SUSP
0.5000 mL | Freq: Once | INTRAMUSCULAR | Status: AC
  Administered 2011-01-19: 0.5 mL via INTRAMUSCULAR

## 2011-01-19 NOTE — Patient Instructions (Addendum)
Preventive Health Care: Exercise at least 30-45 minutes a day,  3-4 days a week.  Eat a low-fat diet with lots of fruits and vegetables, up to 7-9 servings per day. Avoid obesity; your goal is waist measurement < 40 inches.Consume less than 40 grams of sugar per day from foods & drinks with High Fructose Corn Sugar as # 1,2,3 or # 4 on label. The best exercises for the low back include freestyle swimming, stretch aerobics, and yoga.  Please sign a release of records to allow me to receive your labs from the [place].  Please review Dr Gildardo Griffes book Eat, Drink & Be Healthy for dietary cholesterol information.

## 2011-01-19 NOTE — Progress Notes (Signed)
Subjective:    Patient ID: Anthony [name], male    DOB: [birth year], 45 y.o.   MRN: [id number]  HPI  Mr. Teall  is here for a physical;acute issues include residual pain @ op site R shoulder. He is in Physical Therapy.      Review of Systems Patient reports no  vision/ hearing changes,anorexia, weight change, fever ,adenopathy, persistant / recurrent hoarseness, swallowing issues, chest pain,palpitations, edema,persistant / recurrent cough, hemoptysis, dyspnea(rest, exertional, paroxysmal nocturnal), melena,  abdominal pain, excessive heart burn, GU symptoms( dysuria, hematuria, pyuria, voiding/incontinence  issues) syncope, focal weakness, memory loss,numbness & tingling, skin/hair/nail changes,depression, anxiety, or abnormal bruising/bleeding. Rare rectal bleeding; colonoscopy last year for rectal bleeding was negative except for hemorrhoids.  He has recently seen orthopedics for some low back pain. He also has heel spurs.  Extensive labs were done at the [place] last month; I asked him to obtain a copy for review.     Objective:   Physical Exam Gen.: Healthy and well-nourished in appearance. Alert, appropriate and cooperative throughout exam. Head: Normocephalic without obvious abnormalities;  Goatee; no alopecia  Eyes: No corneal or conjunctival inflammation noted. Pupils equal round reactive to light and accommodation. Fundal exam is benign without hemorrhages, exudate, papilledema. Extraocular motion intact. Vision grossly normal.Irregularpigment OD sclera Ears: External  ear exam reveals no significant lesions or deformities. Canals clear .TMs normal.  Nose: External nasal exam reveals no deformity or inflammation. Nasal mucosa are pink and moist. Polyp on L w/o  exudates noted.   Mouth: Oral mucosa and oropharynx reveal no lesions or exudates. Teeth in good repair. Neck: No deformities, masses, or tenderness noted. Range of motion &. Thyroid normal. Lungs: Normal respiratory effort; chest  expands symmetrically. Lungs are clear to auscultation without rales, wheezes, or increased work of breathing. Heart: Normal rate and rhythm. Normal S1 and S2. No gallop, click, or rub. S4 w/o  murmur. Abdomen: Bowel sounds normal; abdomen soft and nontender. No masses, organomegaly or hernias noted. Genitalia/DRE: Genital exam unremarkable; prostate upper limits of normal without nodularity or  induration   .                                                                                   Musculoskeletal/extremities: No deformity or scoliosis noted of  the thoracic or lumbar spine. No clubbing, cyanosis, edema, or deformity noted. Range of motion  normal .Tone & strength  normal.Joints normal. Nail health  good. Vascular: Carotid, radial artery, dorsalis pedis and  posterior tibial pulses are full and equal. No bruits present. Neurologic: Alert and oriented x3. Deep tendon reflexes symmetrical and normal.          Skin: Intact without suspicious lesions or rashes. Lymph: No cervical, axillary, or inguinal lymphadenopathy present. Psych: Mood and affect are normal. Normally interactive  Assessment & Plan:  #1 comprehensive physical exam; no acute findings #2 see Problem List with Assessments & Recommendations Plan: see Orders

## 2011-03-11 ENCOUNTER — Encounter: Payer: Self-pay | Admitting: Internal Medicine

## 2011-03-12 ENCOUNTER — Encounter: Payer: Self-pay | Admitting: Internal Medicine

## 2011-03-12 ENCOUNTER — Ambulatory Visit (INDEPENDENT_AMBULATORY_CARE_PROVIDER_SITE_OTHER): Payer: Federal, State, Local not specified - PPO | Admitting: Internal Medicine

## 2011-03-12 DIAGNOSIS — R7989 Other specified abnormal findings of blood chemistry: Secondary | ICD-10-CM | POA: Insufficient documentation

## 2011-03-12 DIAGNOSIS — E291 Testicular hypofunction: Secondary | ICD-10-CM

## 2011-03-12 DIAGNOSIS — R1011 Right upper quadrant pain: Secondary | ICD-10-CM

## 2011-03-12 DIAGNOSIS — E785 Hyperlipidemia, unspecified: Secondary | ICD-10-CM

## 2011-03-12 DIAGNOSIS — D126 Benign neoplasm of colon, unspecified: Secondary | ICD-10-CM

## 2011-03-12 LAB — LIPID PANEL
Cholesterol: 236 mg/dL — ABNORMAL HIGH (ref 0–200)
HDL: 48.8 mg/dL (ref >39.00)
Total CHOL/HDL Ratio: 5
Triglycerides: 197 mg/dL — ABNORMAL HIGH (ref 0.0–149.0)
VLDL: 39.4 mg/dL (ref 0.0–40.0)

## 2011-03-12 LAB — HEPATIC FUNCTION PANEL
ALT: 32 U/L (ref 0–53)
AST: 20 U/L (ref 0–37)
Albumin: 4.6 g/dL (ref 3.5–5.2)
Alkaline Phosphatase: 88 U/L (ref 39–117)
Bilirubin, Direct: 0 mg/dL (ref 0.0–0.3)
Total Bilirubin: 0.7 mg/dL (ref 0.3–1.2)
Total Protein: 8.1 g/dL (ref 6.0–8.3)

## 2011-03-12 LAB — CBC WITH DIFFERENTIAL/PLATELET
Basophils Absolute: 0 10*3/uL (ref 0.0–0.1)
Basophils Relative: 0.6 % (ref 0.0–3.0)
Eosinophils Absolute: 0.3 10*3/uL (ref 0.0–0.7)
Eosinophils Relative: 5.8 % — ABNORMAL HIGH (ref 0.0–5.0)
HCT: 48.3 % (ref 39.0–52.0)
Hemoglobin: 16.1 g/dL (ref 13.0–17.0)
Lymphocytes Relative: 31.1 % (ref 12.0–46.0)
Lymphs Abs: 1.6 10*3/uL (ref 0.7–4.0)
MCHC: 33.4 g/dL (ref 30.0–36.0)
MCV: 85.7 fl (ref 78.0–100.0)
Monocytes Absolute: 0.4 10*3/uL (ref 0.1–1.0)
Monocytes Relative: 7 % (ref 3.0–12.0)
Neutro Abs: 2.9 10*3/uL (ref 1.4–7.7)
Neutrophils Relative %: 55.5 % (ref 43.0–77.0)
Platelets: 238 10*3/uL (ref 150.0–400.0)
RBC: 5.64 Mil/uL (ref 4.22–5.81)
RDW: 15.8 % — ABNORMAL HIGH (ref 11.5–14.6)
WBC: 5.3 10*3/uL (ref 4.5–10.5)

## 2011-03-12 LAB — LDL CHOLESTEROL, DIRECT: Direct LDL: 174.6 mg/dL

## 2011-03-12 LAB — AMYLASE: Amylase: 137 U/L — ABNORMAL HIGH (ref 27–131)

## 2011-03-12 LAB — LIPASE: Lipase: 28 U/L (ref 11.0–59.0)

## 2011-03-12 NOTE — Patient Instructions (Addendum)
Please review the history and make corrections or additions. Keep a diary of your symptoms. .Share results with all MDs seen

## 2011-03-12 NOTE — Progress Notes (Signed)
  Subjective:    Patient ID: Anthony [name], male    DOB: 21-May-1965, 45 y.o.   MRN: [id number]  HPI ABDOMINAL PAIN: Location: RUQ  Onset: 2 weeks ago   Radiation: no  Severity: up to 5 Quality: dull  Duration: minutes  Better with: no relievers  Worse with: lunch yesterday Symptoms Dyspepsia/dysphagia: no Nausea/Vomiting: no  Diarrhea: no  Constipation: no  Melena/BRBPR: no  Hematemesis: no  Anorexia: no  Fever/Chills: no  Dysuria/hematuria/pyuria: no  Rash: no  Wt loss: no  EtOH use: yes, rarely  NSAIDs/ASA: no, allergic to Aleve  PMH/FH: see below Past Surgeries: herniorrhaphy; colonoscopy with polypectomy 2011, Dr Brodie(Note: done for rectal bleeding)       Review of Systems he is taking oxycodone as needed from his orthopedist following shoulder surgery in August     Objective:   Physical Exam General appearance is one of good health and nourishment w/o distress.  Eyes: No conjunctival inflammation or scleral icterus is present.  Oral exam: Dental hygiene is good; lips and gums are healthy appearing.There is no oropharyngeal erythema or exudate noted.   Heart:  Normal rate and regular rhythm. S1 and S2 normal without gallop, murmur, click, rub or other extra sounds     Lungs:Chest clear to auscultation; no wheezes, rhonchi,rales ,or rubs present.No increased work of breathing.   Abdomen: bowel sounds normal, soft and non-tender without masses, organomegaly or hernias noted.  No guarding or rebound   Skin:Warm & dry.  Intact without suspicious lesions or rashes ; no jaundice or tenting  Lymphatic: No lymphadenopathy is noted about the head, neck, axilla  areas.              Assessment & Plan:  #1 abdominal pain, right upper quadrant intermittent over 2 weeks. Rule out gallbladder disease  #2 past medical history colon polyps in 2011  Plan: See orders and recommendations.

## 2011-03-12 NOTE — Progress Notes (Signed)
Addended byPecola Lawless on: 03/12/2011 08:42 AM   Modules accepted: Orders

## 2011-03-13 LAB — TESTOSTERONE, FREE, TOTAL, SHBG
Sex Hormone Binding: 21 nmol/L (ref 13–71)
Testosterone, Free: 61.4 pg/mL (ref 47.0–244.0)
Testosterone-% Free: 2.5 % (ref 1.6–2.9)
Testosterone: 250.42 ng/dL (ref 250–890)

## 2011-03-16 ENCOUNTER — Other Ambulatory Visit: Payer: Federal, State, Local not specified - PPO

## 2011-03-17 ENCOUNTER — Ambulatory Visit
Admission: RE | Admit: 2011-03-17 | Discharge: 2011-03-17 | Disposition: A | Discharge status: Home / Self Care | Payer: Federal, State, Local not specified - PPO | Source: Ambulatory Visit | Attending: Internal Medicine | Admitting: Internal Medicine

## 2011-03-17 DIAGNOSIS — R1011 Right upper quadrant pain: Secondary | ICD-10-CM

## 2011-04-02 ENCOUNTER — Ambulatory Visit (INDEPENDENT_AMBULATORY_CARE_PROVIDER_SITE_OTHER): Payer: Federal, State, Local not specified - PPO | Admitting: Internal Medicine

## 2011-04-02 DIAGNOSIS — E785 Hyperlipidemia, unspecified: Secondary | ICD-10-CM

## 2011-04-02 DIAGNOSIS — Z889 Allergy status to unspecified drugs, medicaments and biological substances status: Secondary | ICD-10-CM

## 2011-04-02 DIAGNOSIS — Z8249 Family history of ischemic heart disease and other diseases of the circulatory system: Secondary | ICD-10-CM

## 2011-04-02 DIAGNOSIS — Z9109 Other allergy status, other than to drugs and biological substances: Secondary | ICD-10-CM

## 2011-04-02 DIAGNOSIS — R1011 Right upper quadrant pain: Secondary | ICD-10-CM

## 2011-04-02 MED ORDER — PRAVASTATIN SODIUM 20 MG PO TABS: 20.0000 mg | ORAL_TABLET | Freq: Every evening | ORAL | Status: DC

## 2011-04-02 NOTE — Patient Instructions (Addendum)
Eat a low-fat diet with lots of fruits and vegetables, up to 7-9 servings per day. Avoid obesity; your goal is waist measurement < 40 inches.Consume less than 40 grams of sugar per day from foods & drinks with High Fructose Corn Sugar as #1,2,3 or # 4 on label. Follow the low carb nutrition program in [employer] as closely as possible to prevent Diabetes. White carbohydrates (potatoes, rice, bread, and pasta) have a high spike of sugar and a high load of sugar. For example a  baked potato has a cup of sugar and a  french fry  2 teaspoons of sugar. Yams, wild  rice, whole grained bread &  wheat pasta have been much lower spike and load of  sugar. Portions should be the size of a deck of cards or your palm.  Preventive Health Care:Exercise at least 30-45 minutes a day,  3-4 days a week.   Please review Dr Gildardo Griffes book Eat, Drink & Be Healthy for dietary cholesterol information.  Please  schedule fasting Labs :Lipids, hepatic panel in 10 weeks.  Please bring these instructions to that Lab appt.

## 2011-04-02 NOTE — Progress Notes (Signed)
  Subjective:    Patient ID: Anthony [name], male    DOB: 1966-01-26, 45 y.o.   MRN: [id number]  HPI  Dyslipidemia assessment: Prior Advanced Lipid Testing: NMR Lipoprofile 2006: LDL goal = < 100, ideally < 70.  Family history of premature CAD/ MI: Mother MI @ 71; father CVA @ 71 .  Nutrition: eating out frequently .  Exercise: stopped due to shoulder surgery 8/12 . Diabetes : no . HTN: no. Smoking history  : never .     Lab results reviewed :discussed & copy given. His most recent TG were 197; these had been as low as 114 in the past. His LDL is now 174.6; as noted above the goal is less than 100, especially with family history of premature coronary disease/heart.    Review of Systems No  fatigue ; chest pain;claudication; palpitations;  myalgias ; skin changes. He continues to have intermittent right upper quadrant pain with "heavy meals". He was not anemic. Lipase was normal; amylase was mildly elevated at 127 on 11/9. He denies melena or rectal bleeding.  There is no family history of gallbladder disease.       Objective:   Physical Exam he is healthy and well-nourished.  He has no lymphadenopathy about the neck or axilla.  There is minimal erythema posterior pharynx.  He has no scleral icterus  Chest is clear to auscultation with no increased work of breathing.  He has an S4 with no significant murmurs or gallops  Abdomen is nontender even to percussion of the right upper quadrant. No organomegaly or masses.  All pulses are intact without bruits.  He has no cyanosis, edema, or clubbing.       Assessment & Plan:  #1 significant dyslipidemia with suggestion of dietary contributions based on the variable for this rise  #2 family history coronary disease, premature  #3 recurrent right upper quadrant pain post meal; rule out gallbladder disease.  Plan: #1initiate  Statins; also  he'll be asked to obtain the book on  cholesterol restriction. He'll also be asked to  restrict high fructose corn syrup syrup in  his diet.  #2 ultrasound of the gallbladder.

## 2011-04-03 ENCOUNTER — Other Ambulatory Visit: Payer: Self-pay | Admitting: Internal Medicine

## 2011-04-03 DIAGNOSIS — Z9109 Other allergy status, other than to drugs and biological substances: Secondary | ICD-10-CM

## 2011-04-03 DIAGNOSIS — R1011 Right upper quadrant pain: Secondary | ICD-10-CM

## 2011-04-03 NOTE — Progress Notes (Signed)
Addended byPecola Lawless on: 04/03/2011 07:03 AM   Modules accepted: Orders

## 2011-04-06 NOTE — Progress Notes (Signed)
Addended by: Edgardo Roys on: 04/06/2011 09:06 AM   Modules accepted: Orders

## 2011-04-10 ENCOUNTER — Other Ambulatory Visit (HOSPITAL_COMMUNITY): Payer: Federal, State, Local not specified - PPO

## 2011-04-27 ENCOUNTER — Ambulatory Visit (INDEPENDENT_AMBULATORY_CARE_PROVIDER_SITE_OTHER): Payer: Federal, State, Local not specified - PPO | Admitting: Internal Medicine

## 2011-04-27 ENCOUNTER — Encounter: Payer: Self-pay | Admitting: Internal Medicine

## 2011-04-27 VITALS — BP 138/90 | HR 80 | Temp 97.6°F | Wt 200.2 lb

## 2011-04-27 DIAGNOSIS — N342 Other urethritis: Secondary | ICD-10-CM

## 2011-04-27 MED ORDER — DOXYCYCLINE HYCLATE 100 MG PO CAPS: 100.0000 mg | ORAL_CAPSULE | Freq: Two times a day (BID) | ORAL | Status: AC

## 2011-04-27 NOTE — Progress Notes (Signed)
  Subjective:    Patient ID: Anthony [name], male    DOB: Jul 03, 1965, 45 y.o.   MRN: [id number]  HPI Stinging @ urethra: Onset: 7 days    Worsening: stable Symptoms Urgency: no  Frequency: no  Hesitancy: no  Hematuria: no, but present on UA @ UC week of  12/10 Flank Pain: no  Fever: no    Nausea/Vomiting: no  STD exposure/history: no but HSV 1 & 2 +. No skin eruptions to date Discharge: no Rash: no  PMH: More than 3 UTI's last 12 months: no  PMH of  1. DM: no 2. Renal Disease/Calculi: calculi 2005 3. Urinary Tract Abnormality: no  4. Instrumentation/Trauma: no 5. Immunosuppression: no      Review of Systems     Objective:   Physical Exam  General appearance is one of good health and nourishment w/o distress.  Heart:  Normal rate and regular rhythm. S1 and S2 normal without gallop, murmur, click, rub or other extra sounds     Lungs:Chest clear to auscultation; no wheezes, rhonchi,rales ,or rubs present.No increased work of breathing.   Abdomen: bowel sounds normal, soft and non-tender without masses, organomegaly or hernias noted.  No guarding or rebound  Genitalia: benign papule glans penis on L  Skin:Warm & dry.  Intact without suspicious lesions or rashes   Lymphatic: No lymphadenopathy is noted about the head, neck, axilla, or inguinal areas.              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  Plan: See orders and recommendations

## 2011-04-27 NOTE — Patient Instructions (Signed)
Please obtain results from Urgent Care to enter into your chart

## 2011-04-29 ENCOUNTER — Institutional Professional Consult (permissible substitution): Payer: Federal, State, Local not specified - PPO | Admitting: Internal Medicine

## 2011-06-10 ENCOUNTER — Other Ambulatory Visit: Payer: Self-pay | Admitting: Internal Medicine

## 2011-06-10 DIAGNOSIS — T887XXA Unspecified adverse effect of drug or medicament, initial encounter: Secondary | ICD-10-CM

## 2011-06-10 DIAGNOSIS — E785 Hyperlipidemia, unspecified: Secondary | ICD-10-CM

## 2011-06-11 ENCOUNTER — Other Ambulatory Visit: Payer: Federal, State, Local not specified - PPO

## 2011-09-20 ENCOUNTER — Encounter (HOSPITAL_COMMUNITY): Payer: Self-pay | Admitting: Emergency Medicine

## 2011-09-20 ENCOUNTER — Emergency Department (HOSPITAL_COMMUNITY)
Admission: EM | Admit: 2011-09-20 | Discharge: 2011-09-20 | Disposition: A | Discharge status: Home / Self Care | Payer: Federal, State, Local not specified - PPO | Attending: Emergency Medicine | Admitting: Emergency Medicine

## 2011-09-20 DIAGNOSIS — J45901 Unspecified asthma with (acute) exacerbation: Secondary | ICD-10-CM | POA: Insufficient documentation

## 2011-09-20 DIAGNOSIS — E785 Hyperlipidemia, unspecified: Secondary | ICD-10-CM | POA: Insufficient documentation

## 2011-09-20 DIAGNOSIS — N189 Chronic kidney disease, unspecified: Secondary | ICD-10-CM | POA: Insufficient documentation

## 2011-09-20 MED ORDER — ALBUTEROL SULFATE HFA 108 (90 BASE) MCG/ACT IN AERS
2.0000 | INHALATION_SPRAY | Freq: Once | RESPIRATORY_TRACT | Status: AC
  Administered 2011-09-20: 2 via RESPIRATORY_TRACT
  Filled 2011-09-20: qty 6.7

## 2011-09-20 NOTE — Discharge Instructions (Signed)
Asthma Attack Prevention HOW CAN ASTHMA BE PREVENTED? Currently, there is no way to prevent asthma from starting. However, you can take steps to control the disease and prevent its symptoms after you have been diagnosed. Learn about your asthma and how to control it. Take an active role to control your asthma by working with your caregiver to create and follow an asthma action plan. An asthma action plan guides you in taking your medicines properly, avoiding factors that make your asthma worse, tracking your level of asthma control, responding to worsening asthma, and seeking emergency care when needed. To track your asthma, keep records of your symptoms, check your peak flow number using a peak flow meter (handheld device that shows how well air moves out of your lungs), and get regular asthma checkups.  Other ways to prevent asthma attacks include:  Use medicines as your caregiver directs.   Identify and avoid things that make your asthma worse (as much as you can).   Keep track of your asthma symptoms and level of control.   Get regular checkups for your asthma.   With your caregiver, write a detailed plan for taking medicines and managing an asthma attack. Then be sure to follow your action plan. Asthma is an ongoing condition that needs regular monitoring and treatment.   Identify and avoid asthma triggers. A number of outdoor allergens and irritants (pollen, mold, cold air, air pollution) can trigger asthma attacks. Find out what causes or makes your asthma worse, and take steps to avoid those triggers (see below).   Monitor your breathing. Learn to recognize warning signs of an attack, such as slight coughing, wheezing or shortness of breath. However, your lung function may already decrease before you notice any signs or symptoms, so regularly measure and record your peak airflow with a home peak flow meter.   Identify and treat attacks early. If you act quickly, you're less likely to have  a severe attack. You will also need less medicine to control your symptoms. When your peak flow measurements decrease and alert you to an upcoming attack, take your medicine as instructed, and immediately stop any activity that may have triggered the attack. If your symptoms do not improve, get medical help.   Pay attention to increasing quick-relief inhaler use. If you find yourself relying on your quick-relief inhaler (such as albuterol), your asthma is not under control. See your caregiver about adjusting your treatment.  IDENTIFY AND CONTROL FACTORS THAT MAKE YOUR ASTHMA WORSE A number of common things can set off or make your asthma symptoms worse (asthma triggers). Keep track of your asthma symptoms for several weeks, detailing all the environmental and emotional factors that are linked with your asthma. When you have an asthma attack, go back to your asthma diary to see which factor, or combination of factors, might have contributed to it. Once you know what these factors are, you can take steps to control many of them.  Allergies: If you have allergies and asthma, it is important to take asthma prevention steps at home. Asthma attacks (worsening of asthma symptoms) can be triggered by allergies, which can cause temporary increased inflammation of your airways. Minimizing contact with the substance to which you are allergic will help prevent an asthma attack. Animal Dander:   Some people are allergic to the flakes of skin or dried saliva from animals with fur or feathers. Keep these pets out of your home.   If you can't keep a pet outdoors, keep the   pet out of your bedroom and other sleeping areas at all times, and keep the door closed.   Remove carpets and furniture covered with cloth from your home. If that is not possible, keep the pet away from fabric-covered furniture and carpets.  Dust Mites:  Many people with asthma are allergic to dust mites. Dust mites are tiny bugs that are found in  every home, in mattresses, pillows, carpets, fabric-covered furniture, bedcovers, clothes, stuffed toys, fabric, and other fabric-covered items.   Cover your mattress in a special dust-proof cover.   Cover your pillow in a special dust-proof cover, or wash the pillow each week in hot water. Water must be hotter than 130 F to kill dust mites. Cold or warm water used with detergent and bleach can also be effective.   Wash the sheets and blankets on your bed each week in hot water.   Try not to sleep or lie on cloth-covered cushions.   Call ahead when traveling and ask for a smoke-free hotel room. Bring your own bedding and pillows, in case the hotel only supplies feather pillows and down comforters, which may contain dust mites and cause asthma symptoms.   Remove carpets from your bedroom and those laid on concrete, if you can.   Keep stuffed toys out of the bed, or wash the toys weekly in hot water or cooler water with detergent and bleach.  Cockroaches:  Many people with asthma are allergic to the droppings and remains of cockroaches.   Keep food and garbage in closed containers. Never leave food out.   Use poison baits, traps, powders, gels, or paste (for example, boric acid).   If a spray is used to kill cockroaches, stay out of the room until the odor goes away.  Indoor Mold:  Fix leaky faucets, pipes, or other sources of water that have mold around them.   Clean moldy surfaces with a cleaner that has bleach in it.  Pollen and Outdoor Mold:  When pollen or mold spore counts are high, try to keep your windows closed.   Stay indoors with windows closed from late morning to afternoon, if you can. Pollen and some mold spore counts are highest at that time.   Ask your caregiver whether you need to take or increase anti-inflammatory medicine before your allergy season starts.  Irritants:   Tobacco smoke is an irritant. If you smoke, ask your caregiver how you can quit. Ask family  members to quit smoking, too. Do not allow smoking in your home or car.   If possible, do not use a wood-burning stove, kerosene heater, or fireplace. Minimize exposure to all sources of smoke, including incense, candles, fires, and fireworks.   Try to stay away from strong odors and sprays, such as perfume, talcum powder, hair spray, and paints.   Decrease humidity in your home and use an indoor air cleaning device. Reduce indoor humidity to below 60 percent. Dehumidifiers or central air conditioners can do this.   Try to have someone else vacuum for you once or twice a week, if you can. Stay out of rooms while they are being vacuumed and for a short while afterward.   If you vacuum, use a dust mask from a hardware store, a double-layered or microfilter vacuum cleaner bag, or a vacuum cleaner with a HEPA filter.   Sulfites in foods and beverages can be irritants. Do not drink beer or wine, or eat dried fruit, processed potatoes, or shrimp if they cause asthma   symptoms.   Cold air can trigger an asthma attack. Cover your nose and mouth with a scarf on cold or windy days.   Several health conditions can make asthma more difficult to manage, including runny nose, sinus infections, reflux disease, psychological stress, and sleep apnea. Your caregiver will treat these conditions, as well.   Avoid close contact with people who have a cold or the flu, since your asthma symptoms may get worse if you catch the infection from them. Wash your hands thoroughly after touching items that may have been handled by people with a respiratory infection.   Get a flu shot every year to protect against the flu virus, which often makes asthma worse for days or weeks. Also get a pneumonia shot once every five to 10 years.  Drugs:  Aspirin and other painkillers can cause asthma attacks. 10% to 20% of people with asthma have sensitivity to aspirin or a group of painkillers called non-steroidal anti-inflammatory drugs  (NSAIDS), such as ibuprofen and naproxen. These drugs are used to treat pain and reduce fevers. Asthma attacks caused by any of these medicines can be severe and even fatal. These drugs must be avoided in people who have known aspirin sensitive asthma. Products with acetaminophen are considered safe for people who have asthma. It is important that people with aspirin sensitivity read labels of all over-the-counter drugs used to treat pain, colds, coughs, and fever.   Beta blockers and ACE inhibitors are other drugs which you should discuss with your caregiver, in relation to your asthma.  ALLERGY SKIN TESTING  Ask your asthma caregiver about allergy skin testing or blood testing (RAST test) to identify the allergens to which you are sensitive. If you are found to have allergies, allergy shots (immunotherapy) for asthma may help prevent future allergies and asthma. With allergy shots, small doses of allergens (substances to which you are allergic) are injected under your skin on a regular schedule. Over a period of time, your body may become used to the allergen and less responsive with asthma symptoms. You can also take measures to minimize your exposure to those allergens. EXERCISE  If you have exercise-induced asthma, or are planning vigorous exercise, or exercise in cold, humid, or dry environments, prevent exercise-induced asthma by following your caregiver's advice regarding asthma treatment before exercising. Document Released: 04/08/2009 Document Revised: 04/09/2011 Document Reviewed: 04/08/2009 [facility] Patient Information 2012 [place], [place]. Followup the, your primary care, physician.  You've been provided.  A second inhaler that you can keep at work

## 2011-09-20 NOTE — ED Provider Notes (Signed)
Medical screening examination/treatment/procedure(s) were performed by non-physician practitioner and as supervising physician I was immediately available for consultation/collaboration.   Nat Christen, MD 09/20/11 [phone]

## 2011-09-20 NOTE — ED Notes (Signed)
Pt alert, c/o sob, onset earlier today, resp even unlabored, skin pwd, hx of asthma, no audible wheezing noted

## 2011-09-20 NOTE — ED Provider Notes (Signed)
History     CSN: [id number]  Arrival date & time 09/20/11  4098   First MD Initiated Contact with Patient 09/20/11 0335      Chief Complaint  Patient presents with  . Shortness of Breath    (Consider location/radiation/quality/duration/timing/severity/associated sxs/prior treatment) HPI Comments: Patient with a history of asthma, states, that he's been having difficulty breathing.  Okay to use his inhaler before going to work, but left his inhaler at home at work.  He developed some shortness of breath, and mild wheezing.  He left work, and came to the emergency department for treatment rather than go home and use his inhaler  Patient is a 46 y.o. male presenting with shortness of breath. The history is provided by the patient.  Shortness of Breath  The current episode started today. The problem has been gradually worsening. The problem is moderate. The symptoms are aggravated by activity. Associated symptoms include shortness of breath. Pertinent negatives include no chest pain and no wheezing.    Past Medical History  Diagnosis Date  . Asthma   . Hyperlipidemia   . Chronic kidney disease   . FH: colonic polyps     Past Surgical History  Procedure Date  . Cystoscopy w/ stone manipulation 2006  . Shoulder surgery 12/17/2010    Dr Madelon Lips ; shoulder impingement & torn tendon  . Hernia repair 1990  . Colonoscopy with polypectomy 2011    Dr Juanda Chance, hemorrhoids  . Cataract extraction 1996    Dr Chales Abrahams; OS    Family History  Problem Relation Age of Onset  . Heart attack Mother 87  . Stroke Father 53  . Diabetes Father     PVD  . Glaucoma Father     blindness  . Cancer Paternal Grandmother   . Heart attack Maternal Uncle      X2,pre 55    History  Substance Use Topics  . Smoking status: Never Smoker   . Smokeless tobacco: Not on file  . Alcohol Use: Yes     Socially , < 2X/ month      Review of Systems  Respiratory: Positive for shortness of breath. Negative  for wheezing.   Cardiovascular: Negative for chest pain.    Allergies  Naproxen sodium  Home Medications   Current Outpatient Rx  Name Route Sig Dispense Refill  . ALBUTEROL SULFATE HFA 108 (90 BASE) MCG/ACT IN AERS Inhalation Inhale 2 puffs into the lungs every 6 (six) hours as needed. For shortness of breath    . BUDESONIDE-FORMOTEROL FUMARATE 160-4.5 MCG/ACT IN AERO Inhalation Inhale 2 puffs into the lungs 2 (two) times daily.    . TRAMADOL HCL 50 MG PO TABS Oral Take 50 mg by mouth every 6 (six) hours as needed. For pain      BP 130/79  Pulse 85  Temp(Src) 98 F (36.7 C) (Oral)  Resp 16  SpO2 100%  Physical Exam  Constitutional: He is oriented to person, place, and time. He appears well-developed and well-nourished.  HENT:  Head: Normocephalic.  Eyes: Pupils are equal, round, and reactive to light.  Neck: Normal range of motion.  Cardiovascular: Normal rate.   Pulmonary/Chest: Effort normal. He has no wheezes. He exhibits no tenderness.  Abdominal: Soft.  Musculoskeletal: Normal range of motion.  Neurological: He is alert and oriented to person, place, and time.  Skin: Skin is warm.    ED Course  Procedures (including critical care time)  Labs Reviewed - No data to display No  results found.   1. Asthma attack    Patient is breathing easier after albuterol inhaler 2 puffs   MDM  Asthma, exacerbation        Arman Filter, NP 09/20/11 0448

## 2011-09-21 ENCOUNTER — Encounter (HOSPITAL_COMMUNITY): Payer: Self-pay | Admitting: Emergency Medicine

## 2011-09-21 ENCOUNTER — Emergency Department (HOSPITAL_COMMUNITY)
Admission: EM | Admit: 2011-09-21 | Discharge: 2011-09-21 | Disposition: A | Discharge status: Home / Self Care | Payer: Federal, State, Local not specified - PPO | Source: Home / Self Care | Attending: Family Medicine | Admitting: Family Medicine

## 2011-09-21 DIAGNOSIS — J069 Acute upper respiratory infection, unspecified: Secondary | ICD-10-CM

## 2011-09-21 MED ORDER — BENZONATATE 100 MG PO CAPS: 100.0000 mg | ORAL_CAPSULE | Freq: Three times a day (TID) | ORAL | Status: AC

## 2011-09-21 MED ORDER — HYDROCODONE-ACETAMINOPHEN 7.5-500 MG/15ML PO SOLN: 10.0000 mL | Freq: Three times a day (TID) | ORAL | Status: AC | PRN

## 2011-09-21 MED ORDER — CETIRIZINE-PSEUDOEPHEDRINE ER 5-120 MG PO TB12: 1.0000 | ORAL_TABLET | Freq: Two times a day (BID) | ORAL | Status: DC | PRN

## 2011-09-21 MED ORDER — AZITHROMYCIN 250 MG PO TABS: 250.0000 mg | ORAL_TABLET | Freq: Every day | ORAL | Status: AC

## 2011-09-21 MED ORDER — PREDNISONE 20 MG PO TABS: ORAL_TABLET | ORAL | Status: AC

## 2011-09-21 NOTE — ED Notes (Signed)
Pt. Stated, I've had a cold and cough since sun.  I went to ER on Sat night and was treated for asthma.  Given albuterol and symbicort.  My cold and cough are still there.

## 2011-09-21 NOTE — Discharge Instructions (Signed)
Is likely you have a viral respiratory infection also triggering your asthma symptoms that some times manifest only by persistent cough with no much obvious wheezing.  Is very important top keep well hydrated. Take the prescribed medications as instructed. Take ibuprofen every 8 hours scheduled for the next 24-48 hours take with food and plenty of liquids as it can upset your stomach. Use nasal saline spray at least 3 times a day. (simply saline is over the counter) Start the prescribed antibiotic only if no improvement of your symptoms after 48 hours. Return if difficulty breathing or not keeping fluids down.

## 2011-09-23 NOTE — ED Provider Notes (Signed)
History     CSN: [id number]  Arrival date & time 09/21/11  1747   First MD Initiated Contact with Patient 09/21/11 1801      Chief Complaint  Patient presents with  . Cough    (Consider location/radiation/quality/duration/timing/severity/associated sxs/prior treatment) HPI Comments: 46 y/o male with h/o asthma was seen yesterday at [facility] ED for asthma exacerbation was given a prescription for symbicort and albuterol. Comes here c/o persistent cough, general malaise, nasal congestion, headache and sinus pressure, subjective fever since last night. Using inhalers but cough is not improving. Denies current chest pain or shortness of breath. Taking also otc advil, dayquil and nyquil with no significant improvement..   Past Medical History  Diagnosis Date  . Asthma   . Hyperlipidemia   . Chronic kidney disease   . FH: colonic polyps     Past Surgical History  Procedure Date  . Cystoscopy w/ stone manipulation 2006  . Shoulder surgery 12/17/2010    Dr Madelon Lips ; shoulder impingement & torn tendon  . Hernia repair 1990  . Colonoscopy with polypectomy 2011    Dr Juanda Chance, hemorrhoids  . Cataract extraction 1996    Dr Chales Abrahams; OS    Family History  Problem Relation Age of Onset  . Heart attack Mother 37  . Stroke Father 64  . Diabetes Father     PVD  . Glaucoma Father     blindness  . Cancer Paternal Grandmother   . Heart attack Maternal Uncle      X2,pre 55    History  Substance Use Topics  . Smoking status: Never Smoker   . Smokeless tobacco: Not on file  . Alcohol Use: Yes     Socially , < 2X/ month      Review of Systems  Constitutional: Positive for fever and appetite change. Negative for diaphoresis.  HENT: Positive for congestion, rhinorrhea and sinus pressure. Negative for sore throat, facial swelling, trouble swallowing and neck pain.   Eyes: Negative for discharge.  Respiratory: Positive for cough. Negative for shortness of breath and wheezing.     Cardiovascular: Negative for chest pain, palpitations and leg swelling.  Gastrointestinal: Negative for abdominal pain.  Genitourinary: Negative for dysuria and hematuria.  Musculoskeletal: Positive for myalgias and arthralgias.  Skin: Negative for rash.  Neurological: Positive for headaches.    Allergies  Naproxen sodium  Home Medications   Current Outpatient Rx  Name Route Sig Dispense Refill  . ALBUTEROL SULFATE HFA 108 (90 BASE) MCG/ACT IN AERS Inhalation Inhale 2 puffs into the lungs every 6 (six) hours as needed. For shortness of breath    . AZITHROMYCIN 250 MG PO TABS Oral Take 1 tablet (250 mg total) by mouth daily. Take first 2 tablets together, then 1 every day until finished. 6 tablet 0  . BENZONATATE 100 MG PO CAPS Oral Take 1 capsule (100 mg total) by mouth every 8 (eight) hours. 15 capsule 0  . BUDESONIDE-FORMOTEROL FUMARATE 160-4.5 MCG/ACT IN AERO Inhalation Inhale 2 puffs into the lungs 2 (two) times daily.    Marland Kitchen CETIRIZINE-PSEUDOEPHEDRINE ER 5-120 MG PO TB12 Oral Take 1 tablet by mouth 2 (two) times daily as needed for allergies or rhinitis. 30 tablet 0  . HYDROCODONE-ACETAMINOPHEN 7.5-500 MG/15ML PO SOLN Oral Take 10 mLs by mouth every 8 (eight) hours as needed for pain or cough. 60 mL 0  . PREDNISONE 20 MG PO TABS  2 tabs by mouth daily for 5 days 10 tablet no  . TRAMADOL HCL  50 MG PO TABS Oral Take 50 mg by mouth every 6 (six) hours as needed. For pain      BP 137/92  Pulse 98  Temp(Src) 98.7 F (37.1 C) (Oral)  Resp 20  SpO2 100%  Physical Exam  Nursing note and vitals reviewed. Constitutional: He is oriented to person, place, and time. He appears well-developed and well-nourished. No distress.  HENT:  Head: Normocephalic and atraumatic.       Nasal Congestion with erythema and swelling of nasal turbinates, thin white rhinorrhea. Mild pharyngeal erythema no exudates. No uvula deviation. No trismus. TM's normal.   Eyes: Conjunctivae and EOM are normal.  Pupils are equal, round, and reactive to light.  Neck: Normal range of motion. Neck supple.  Cardiovascular: Normal heart sounds.   Pulmonary/Chest: Effort normal. No respiratory distress. He has no wheezes. He has no rales. He exhibits no tenderness.       Sporadic expiratory rhonchi bilaterally. Otherwise normal lung exam.  Lymphadenopathy:    He has no cervical adenopathy.  Neurological: He is alert and oriented to person, place, and time.  Skin: No rash noted.    ED Course  Procedures (including critical care time)  Labs Reviewed - No data to display No results found.   1. URI (upper respiratory infection)       MDM  Impress allergic vs viral rhino sinusitis triggering asthma symptoms. Normal lung exam. New onset of fever signs of sinusitis. Given a hold prescription of azithromycin to fill if persistent fever after 5-7 days from day #1 of symptoms. Patient already using albuterol and symbicort. I prescribed prednisone short cycle. Cetirizine/pseudoefedrine, tessalon perls and Lortab.  Return if chest pain or persistent or worsening difficulty breathing despite following treatment.         Sharin Grave, MD 09/23/11 1343

## 2011-09-24 ENCOUNTER — Telehealth (HOSPITAL_COMMUNITY): Payer: Self-pay | Admitting: *Deleted

## 2011-09-24 NOTE — ED Notes (Signed)
Pt. called for a work note extension. States he still feels bad. I asked his sx.'s. He said congestion and prod. cough.  I told him I would call back.  Discussed with Dr. Ladon Applebaum and he said pt. can have today off but if still not better tomorrow would have to come back for a recheck. Note done as directed  and put at the front desk. I called pt. and told gave him this information. Vassie Moselle 09/24/2011

## 2011-09-25 ENCOUNTER — Other Ambulatory Visit: Payer: Federal, State, Local not specified - PPO

## 2011-09-25 ENCOUNTER — Ambulatory Visit (INDEPENDENT_AMBULATORY_CARE_PROVIDER_SITE_OTHER): Payer: Federal, State, Local not specified - PPO | Admitting: Internal Medicine

## 2011-09-25 ENCOUNTER — Encounter: Payer: Self-pay | Admitting: Internal Medicine

## 2011-09-25 ENCOUNTER — Encounter: Payer: Self-pay | Admitting: *Deleted

## 2011-09-25 VITALS — BP 102/78 | HR 78 | Ht 67.0 in | Wt 203.2 lb

## 2011-09-25 DIAGNOSIS — J45998 Other asthma: Secondary | ICD-10-CM

## 2011-09-25 DIAGNOSIS — J45909 Unspecified asthma, uncomplicated: Secondary | ICD-10-CM

## 2011-09-25 DIAGNOSIS — J302 Other seasonal allergic rhinitis: Secondary | ICD-10-CM

## 2011-09-25 DIAGNOSIS — J309 Allergic rhinitis, unspecified: Secondary | ICD-10-CM

## 2011-09-25 MED ORDER — METHYLPREDNISOLONE ACETATE 80 MG/ML IJ SUSP
80.0000 mg | Freq: Once | INTRAMUSCULAR | Status: AC
  Administered 2011-09-25: 80 mg via INTRAMUSCULAR

## 2011-09-25 MED ORDER — PHENYLEPHRINE HCL 1 % NA SOLN
3.0000 [drp] | Freq: Four times a day (QID) | NASAL | Status: DC | PRN
  Administered 2011-09-25: 3 [drp] via NASAL

## 2011-09-25 NOTE — Patient Instructions (Addendum)
Neb neo nasal  Depo 80  Order- lab- Allergy profile   Dx allergic asthma, allergic rhintis  Sample Dymista nasal spray     Try using 2 puffs each nostril daily at bedtime. See if if gradually improves how your nose and sinuses feel.  Order - schedule PFT

## 2011-09-25 NOTE — Progress Notes (Signed)
09/25/11- 45 yoM never smoker, referred by Dr. Alwyn Ren for allergy evaluation. He had hayfever with asthma as a child, sinus infections and seasonal rhinitis with sneezing chest tightness and shortness of breath. While in the [employer] in [place] in the late 1980s, he had a bronchial pneumonia. Subsequent episodes of wheezing and cough were called asthma. He was followed by Dr Stevphen Rochester years ago but those records are no longer available. He was on allergy vaccine or one year. He recently had exacerbation and had to leave work 6 days ago, going to the emergency room for nebulizer treatment with symptoms of an upper respiratory infection. The next day he was seen at an urgent care and treated with prednisone. He has been out of work all week. His usual management includes Symbicort and at least daily use of a rescue inhaler. He is finishing a course of prednisone. Nasal discharge is clear yellow. He had frontal headache early in the week, now resolved. No ENT surgery. No problem with latex, contrast dye, aspirin, specific foods. He has had eczema. 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 CXR- IMPRESSION: 2010- No active lung disease. Question bronchitis.  Provider: Bud Face  Prior to Admission medications   Medication Sig Start Date End Date Taking? Authorizing Provider  albuterol (PROVENTIL HFA;VENTOLIN HFA) 108 (90 BASE) MCG/ACT inhaler Inhale 2 puffs into the lungs every 6 (six) hours as needed. For shortness of breath   Yes Historical Provider, MD  budesonide-formoterol (SYMBICORT) 160-4.5 MCG/ACT inhaler Inhale 2 puffs into the lungs 2 (two) times daily.   Yes Historical Provider, MD  cetirizine-pseudoephedrine (ZYRTEC-D) 5-120 MG per tablet Take 1 tablet by mouth 2 (two) times daily as needed for allergies or rhinitis. 09/21/11 09/20/12 Yes Adlih Moreno-Coll, MD  HYDROcodone-acetaminophen (LORTAB)  7.5-500 MG/15ML solution Take 10 mLs by mouth every 8 (eight) hours as needed for pain or cough. 09/21/11 10/01/11 Yes Adlih Moreno-Coll, MD  predniSONE (DELTASONE) 20 MG tablet 2 tabs by mouth daily for 5 days 09/21/11 10/01/11 Yes Adlih Moreno-Coll, MD  traMADol (ULTRAM) 50 MG tablet Take 50 mg by mouth every 6 (six) hours as needed. For pain   Yes Historical Provider, MD   Past Medical History  Diagnosis Date  . Asthma   . Hyperlipidemia   . Chronic kidney disease   . FH: colonic polyps    Past Surgical History  Procedure Date  . Cystoscopy w/ stone manipulation 2006  . Shoulder surgery 12/17/2010    Dr Madelon Lips ; shoulder impingement & torn tendon  . Hernia repair 1990  . Colonoscopy with polypectomy 2011    Dr Juanda Chance, hemorrhoids  . Cataract extraction 1996    Dr Chales Abrahams; OS   Family History  Problem Relation Age of Onset  . Heart attack Mother 74  . Stroke Father 86  . Diabetes Father     PVD  . Glaucoma Father     blindness  . Cancer Paternal Grandmother   . Heart attack Maternal Uncle      X2,pre 55   History   Social History  . Marital Status: Divorced    Spouse Name: N/A    Number of Children: N/A  . Years of Education: N/A   Occupational History  . Not on file.   Social History Main Topics  . Smoking status: Never Smoker   . Smokeless tobacco: Not on file  . Alcohol Use: Yes  Socially , < 2X/ month  . Drug Use: No  . Sexually Active: Not on file   Other Topics Concern  . Not on file   Social History Narrative  . No narrative on file   ROS-see HPI Constitutional:   No-   weight loss, night sweats, fevers, chills, fatigue, lassitude. HEENT:   No-  headaches, difficulty swallowing, tooth/dental problems, sore throat,       No-  sneezing, itching, ear ache, +nasal congestion, post nasal drip,  CV:  +  chest pain, No- orthopnea, PND, swelling in lower extremities, anasarca, dizziness, palpitations Resp: +  shortness of breath with exertion or at rest.               +   productive cough,  No non-productive cough,  No- coughing up of blood.                change in color of mucus.  No- wheezing.   Skin: No-   rash or lesions. GI:  No-   heartburn, indigestion, abdominal pain, nausea, vomiting, GU: No-   dysuria, MS:  No-   joint pain or swelling.   Neuro-     nothing unusual Psych:  No- change in mood or affect. No depression or anxiety.  No memory loss.  OBJ- Physical Exam General- Alert, Oriented, Affect-appropriate, Distress- none acute Skin- rash-none, lesions- none, excoriation- none Lymphadenopathy- none Head- atraumatic            Eyes- Gross vision intact, PERRLA, conjunctivae and secretions clear            Ears- Hearing, canals-normal            Nose-+ turbinate edema with mucus, no-Septal dev,  polyps, erosion, perforation             Throat- Mallampati III , mucosa clear , drainage- none, tonsils- atrophic Neck- flexible , trachea midline, no stridor , thyroid nl, carotid no bruit Chest - symmetrical excursion , unlabored           Heart/CV- RRR , no murmur , no gallop  , no rub, nl s1 s2                           - JVD- none , edema- none, stasis changes- none, varices- none           Lung- clear to P&A, wheeze- none, cough- none , dullness-none, rub- none           Chest wall-  Abd- tender-no, distended-no, bowel sounds-present, HSM- no Br/ Gen/ Rectal- Not done, not indicated Extrem- cyanosis- none, clubbing, none, atrophy- none, strength- nl Neuro- grossly intact to observation

## 2011-09-29 LAB — ALLERGY FULL PROFILE
Allergen, D pternoyssinus,d7: 0.13 kU/L (ref <0.35)
Allergen,Goose feathers, e70: <0.1 kU/L (ref <0.35)
Alternaria Alternata: 12 kU/L — ABNORMAL HIGH (ref <0.35)
Aspergillus fumigatus, IgG: 0.35 kU/L — ABNORMAL HIGH (ref <0.35)
Bahia Grass: 0.39 kU/L — ABNORMAL HIGH (ref <0.35)
Bermuda Grass: 1.09 kU/L — ABNORMAL HIGH (ref <0.35)
Box Elder IgE: <0.1 kU/L (ref <0.35)
Candida Albicans: 0.16 kU/L (ref <0.35)
Cat Dander: <0.1 kU/L (ref <0.35)
Common Ragweed: 8.08 kU/L — ABNORMAL HIGH (ref <0.35)
Curvularia lunata: 1.51 kU/L — ABNORMAL HIGH (ref <0.35)
D. farinae: <0.1 kU/L (ref <0.35)
Dog Dander: 0.11 kU/L (ref <0.35)
Elm IgE: <0.1 kU/L (ref <0.35)
Fescue: 26.5 kU/L — ABNORMAL HIGH (ref <0.35)
G005 Rye, Perennial: 21.7 kU/L — ABNORMAL HIGH (ref <0.35)
G009 Red Top: 26.4 kU/L — ABNORMAL HIGH (ref <0.35)
Goldenrod: 1.06 kU/L — ABNORMAL HIGH (ref <0.35)
Helminthosporium halodes: 2.8 kU/L — ABNORMAL HIGH (ref <0.35)
House Dust Hollister: <0.1 kU/L (ref <0.35)
IgE (Immunoglobulin E), Serum: 159.6 IU/mL (ref 0.0–180.0)
Lamb's Quarters: 0.22 kU/L (ref <0.35)
Oak: 15.7 kU/L — ABNORMAL HIGH (ref <0.35)
Plantain: 0.1 kU/L (ref <0.35)
Stemphylium Botryosum: 4.05 kU/L — ABNORMAL HIGH (ref <0.35)
Sycamore Tree: 0.11 kU/L (ref <0.35)
Timothy Grass: 16.4 kU/L — ABNORMAL HIGH (ref <0.35)

## 2011-10-01 DIAGNOSIS — J302 Other seasonal allergic rhinitis: Secondary | ICD-10-CM | POA: Insufficient documentation

## 2011-10-01 DIAGNOSIS — J3089 Other allergic rhinitis: Secondary | ICD-10-CM | POA: Insufficient documentation

## 2011-10-01 NOTE — Assessment & Plan Note (Signed)
Plan-allergy profile, nasal decongestant neb, Depo-Medrol, sample Dymista nasal spray

## 2011-10-01 NOTE — Assessment & Plan Note (Signed)
Not yet clear if this is more of a recurrent or chronic bronchitis as opposed to asthma. Plan-finished prednisone Allergy profile, and to speak PFT and stable

## 2011-10-08 ENCOUNTER — Ambulatory Visit (INDEPENDENT_AMBULATORY_CARE_PROVIDER_SITE_OTHER): Payer: Federal, State, Local not specified - PPO | Admitting: Internal Medicine

## 2011-10-08 DIAGNOSIS — J45998 Other asthma: Secondary | ICD-10-CM

## 2011-10-08 DIAGNOSIS — J45909 Unspecified asthma, uncomplicated: Secondary | ICD-10-CM

## 2011-10-08 LAB — PULMONARY FUNCTION TEST

## 2011-10-08 NOTE — Progress Notes (Signed)
PFT done today. 

## 2011-10-13 ENCOUNTER — Encounter: Payer: Self-pay | Admitting: Internal Medicine

## 2011-11-12 ENCOUNTER — Ambulatory Visit (INDEPENDENT_AMBULATORY_CARE_PROVIDER_SITE_OTHER): Payer: Federal, State, Local not specified - PPO | Admitting: Internal Medicine

## 2011-11-12 ENCOUNTER — Encounter: Payer: Self-pay | Admitting: Internal Medicine

## 2011-11-12 VITALS — BP 124/76 | HR 85 | Ht 67.0 in | Wt 206.0 lb

## 2011-11-12 DIAGNOSIS — J45998 Other asthma: Secondary | ICD-10-CM

## 2011-11-12 DIAGNOSIS — J309 Allergic rhinitis, unspecified: Secondary | ICD-10-CM

## 2011-11-12 DIAGNOSIS — J45909 Unspecified asthma, uncomplicated: Secondary | ICD-10-CM

## 2011-11-12 DIAGNOSIS — J302 Other seasonal allergic rhinitis: Secondary | ICD-10-CM

## 2011-11-12 NOTE — Progress Notes (Signed)
09/25/11- 45 yoM never smoker, referred by Dr. Alwyn Ren for allergy evaluation. He had hayfever with asthma as a child, sinus infections and seasonal rhinitis with sneezing chest tightness and shortness of breath. While in the [employer] in [place] in the late 1980s, he had a bronchial pneumonia. Subsequent episodes of wheezing and cough were called asthma. He was followed by Dr Stevphen Rochester years ago but those records are no longer available. He was on allergy vaccine or one year. He recently had exacerbation and had to leave work 6 days ago, going to the emergency room for nebulizer treatment with symptoms of an upper respiratory infection. The next day he was seen at an urgent care and treated with prednisone. He has been out of work all week. His usual management includes Symbicort and at least daily use of a rescue inhaler. He is finishing a course of prednisone. Nasal discharge is clear yellow. He had frontal headache early in the week, now resolved. No ENT surgery. No problem with latex, contrast dye, aspirin, specific foods. He has had eczema. 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 CXR-02/01/09- reviewed- IMPRESSION:  No active lung disease. Question bronchitis.  Provider: Bud Face  11/12/11- 45 yoM never smoker followed for allergic rhinitis, asthma Review PFT results with patient; feels like everything has cleared up since last visit and with using Dymista(did not want Rx) Allergy Profile 09/25/2011-total IgE 159.6 with multiple specific elevations especially for grass pollens, molds, tree pollens and ragweed. PFT: 10/08/2011-mild obstructive airways disease-small airways, insignificant response to bronchodilator.  ROS-see HPI Constitutional:   No-   weight loss, night sweats, fevers, chills, fatigue, lassitude. HEENT:   No-  headaches, difficulty swallowing, tooth/dental problems, sore throat,     No-  sneezing, itching, ear ache, +less nasal congestion, post nasal drip,  CV:  No- chest pain, No- orthopnea, PND, swelling in lower extremities, anasarca, dizziness, palpitations Resp: +No-shortness of breath with exertion or at rest.              No-  productive cough,  No non-productive cough,  No- coughing up of blood.                change in color of mucus.  No- wheezing.   Skin: No-   rash or lesions. GI:  No-   heartburn, indigestion, abdominal pain, nausea, vomiting, GU:, MS:  No-   joint pain or swelling.   Neuro-     nothing unusual Psych:  No- change in mood or affect. No depression or anxiety.  No memory loss.  OBJ- Physical Exam General- Alert, Oriented, Affect-appropriate, Distress- none acute Skin- rash-none, lesions- none, excoriation- none Lymphadenopathy- none Head- atraumatic            Eyes- Gross vision intact, PERRLA, conjunctivae and secretions clear            Ears- Hearing, canals-normal            Nose- looks clear, no-Septal dev, polyps, erosion, perforation             Throat- Mallampati III , mucosa clear , drainage- none, tonsils- atrophic Neck- flexible , trachea midline, no stridor , thyroid nl, carotid no bruit Chest - symmetrical excursion , unlabored           Heart/CV- RRR , no murmur , no gallop  , no rub, nl s1 s2                           -  JVD- none , edema- none, stasis changes- none, varices- none           Lung- clear to P&A, wheeze- none, cough- none , dullness-none, rub- none           Chest wall-  Abd-  Br/ Gen/ Rectal- Not done, not indicated Extrem- cyanosis- none, clubbing, none, atrophy- none, strength- nl Neuro- grossly intact to observation

## 2011-11-12 NOTE — Patient Instructions (Addendum)
Suggest that if you feel comfortable, you can try to manage with Symbicort, just one puff, twice daily. At the first sign of trouble, go back up to two puffs, twice daily before you get bad.  The blood tests do indicate seasonal allergy to pollens is probably a part of your asthma. We can do more for this if needed.    Please call as needed.

## 2011-11-20 NOTE — Assessment & Plan Note (Signed)
There is a significant atopic component. Seasonal improvement for now. We will see if resumption of Dymista is necessary/ sufficient in the fall.

## 2011-11-20 NOTE — Assessment & Plan Note (Signed)
These PFT scores would be consistent with asthma currently in remission and will be useful for future comparison.

## 2011-11-27 ENCOUNTER — Emergency Department (HOSPITAL_COMMUNITY)
Admission: EM | Admit: 2011-11-27 | Discharge: 2011-11-28 | Disposition: A | Discharge status: Home / Self Care | Payer: Federal, State, Local not specified - PPO | Attending: Emergency Medicine | Admitting: Emergency Medicine

## 2011-11-27 ENCOUNTER — Encounter (HOSPITAL_COMMUNITY): Payer: Self-pay | Admitting: *Deleted

## 2011-11-27 DIAGNOSIS — Z823 Family history of stroke: Secondary | ICD-10-CM | POA: Insufficient documentation

## 2011-11-27 DIAGNOSIS — J45909 Unspecified asthma, uncomplicated: Secondary | ICD-10-CM | POA: Insufficient documentation

## 2011-11-27 DIAGNOSIS — Z8371 Family history of colonic polyps: Secondary | ICD-10-CM | POA: Insufficient documentation

## 2011-11-27 DIAGNOSIS — E785 Hyperlipidemia, unspecified: Secondary | ICD-10-CM | POA: Insufficient documentation

## 2011-11-27 DIAGNOSIS — Z83719 Family history of colon polyps, unspecified: Secondary | ICD-10-CM | POA: Insufficient documentation

## 2011-11-27 DIAGNOSIS — Z8249 Family history of ischemic heart disease and other diseases of the circulatory system: Secondary | ICD-10-CM | POA: Insufficient documentation

## 2011-11-27 DIAGNOSIS — M25519 Pain in unspecified shoulder: Secondary | ICD-10-CM | POA: Insufficient documentation

## 2011-11-27 DIAGNOSIS — N289 Disorder of kidney and ureter, unspecified: Secondary | ICD-10-CM | POA: Insufficient documentation

## 2011-11-27 DIAGNOSIS — Z833 Family history of diabetes mellitus: Secondary | ICD-10-CM | POA: Insufficient documentation

## 2011-11-27 NOTE — ED Notes (Signed)
R Shoulder pain, acute exacerbation of chronic injury.

## 2011-11-27 NOTE — ED Notes (Addendum)
Pt states he is only taking tramadol 50 mg BID and is not taking the robaxin as rx'd. Pt states that doctor would not prescribe other medication for his pain. Pt states pain worsened today. Pt states he wants stronger medication for his pain but does not take robaxin as rx'd because it makes him "sluggish". Pt denies reinjury.

## 2011-11-28 MED ORDER — KETOROLAC TROMETHAMINE 30 MG/ML IJ SOLN
30.0000 mg | Freq: Once | INTRAMUSCULAR | Status: AC
  Administered 2011-11-28: 30 mg via INTRAMUSCULAR
  Filled 2011-11-28: qty 1

## 2011-11-28 NOTE — ED Notes (Signed)
Pt calling for work not for tonight stated was still feeling the effects from the injection he rcvd this am.   Marlon Pel PA consulted may give pt note for tonight may return to work 11/29/11 without restrictions.  Pt informed of PA's decision.  He will pick up note at [facility].

## 2011-11-28 NOTE — ED Provider Notes (Signed)
Medical screening examination/treatment/procedure(s) were performed by non-physician practitioner and as supervising physician I was immediately available for consultation/collaboration. Devoria Albe, MD, FACEP   Ward Givens, MD 11/28/11 [phone]

## 2011-11-28 NOTE — ED Provider Notes (Signed)
History     CSN: [id number]  Arrival date & time 11/27/11  2246   First MD Initiated Contact with Patient 11/28/11 0254      Chief Complaint  Patient presents with  . Shoulder Pain    workman's comp injury from 04/2010    (Consider location/radiation/quality/duration/timing/severity/associated sxs/prior treatment) HPI Comments: Patient states he had rotator cuff injury, and surgery performed by [facility] the beginning of this year.  He has attended physical therapy, but has been dismissed.  He has been taking Ultram, and Robaxin, but does not like the side effect of Robaxin, so has stopped taking meds.  He, states the Ultram is just not strong enough get an MRI on Tuesday, and is awaiting the results.  He has contacted his doctors office and  been told to continue taking the Ultram.  He denies new injury, but states his doctor took him out of work for 10 days, which ended last Tuesday, and he has not been to work since, although he, states he's been calling the office every day, and hasn't gotten a response to his request for an extension.  He, states he's going to try to see his physician on Monday  Patient is a 46 y.o. male presenting with shoulder pain. The history is provided by the patient.  Shoulder Pain This is a chronic problem. The problem occurs constantly. The problem has been unchanged. Associated symptoms include arthralgias. Pertinent negatives include no chills, fever, joint swelling or weakness.    Past Medical History  Diagnosis Date  . Asthma   . Hyperlipidemia   . Chronic kidney disease   . FH: colonic polyps     Past Surgical History  Procedure Date  . Cystoscopy w/ stone manipulation 2006  . Shoulder surgery 12/17/2010    Dr Madelon Lips ; shoulder impingement & torn tendon  . Hernia repair 1990  . Colonoscopy with polypectomy 2011    Dr Juanda Chance, hemorrhoids  . Cataract extraction 1996    Dr Chales Abrahams; OS    Family History  Problem Relation Age of  Onset  . Heart attack Mother 3  . Stroke Father 2  . Diabetes Father     PVD  . Glaucoma Father     blindness  . Cancer Paternal Grandmother   . Heart attack Maternal Uncle      X2,pre 55    History  Substance Use Topics  . Smoking status: Never Smoker   . Smokeless tobacco: Not on file  . Alcohol Use: Yes     Socially , < 2X/ month      Review of Systems  Constitutional: Negative for fever and chills.  Musculoskeletal: Positive for arthralgias. Negative for joint swelling.  Neurological: Negative for dizziness and weakness.    Allergies  Review of patient's allergies indicates no known allergies.  Home Medications   Current Outpatient Rx  Name Route Sig Dispense Refill  . ALBUTEROL SULFATE HFA 108 (90 BASE) MCG/ACT IN AERS Inhalation Inhale 2 puffs into the lungs every 6 (six) hours as needed. For shortness of breath    . TRAMADOL HCL 50 MG PO TABS Oral Take 50 mg by mouth every 6 (six) hours as needed. For pain      BP 125/82  Pulse 69  Temp 97.8 F (36.6 C) (Oral)  Resp 17  SpO2 98%  Physical Exam  Constitutional: He appears well-developed.  HENT:  Head: Normocephalic.  Eyes: Pupils are equal, round, and reactive to light.  Neck: Normal range  of motion.  Cardiovascular: Normal rate.   Musculoskeletal: He exhibits no edema and no tenderness.       Patient can't reach arm  90, to the side to 145 touching the top of his head.  He can also rotate arm backwards while elbow was flexed to 45.  He has positive distal pulses.  Normal color, and temperature of the arm.  No swelling  Neurological: He is alert.  Skin: Skin is warm. No erythema.    ED Course  Procedures (including critical care time)  Labs Reviewed - No data to display No results found.   1. Shoulder arthralgia       MDM   I was unable to access MRI results.  Patient has adequate range of motion of his arm.  No indication of swelling.  Pulses are strong and regular, color, and  temperature of arm, is normal.  Compared to his left arm.  I will give him a shot of Toradol, and suggest he try calling his physician again in the morning        Arman Filter, NP 11/28/11 1610  Arman Filter, NP 11/28/11 9604  Arman Filter, NP 11/28/11 [phone]

## 2012-04-08 ENCOUNTER — Encounter: Payer: Self-pay | Admitting: Internal Medicine

## 2012-04-08 ENCOUNTER — Ambulatory Visit (INDEPENDENT_AMBULATORY_CARE_PROVIDER_SITE_OTHER): Payer: Federal, State, Local not specified - PPO | Admitting: Internal Medicine

## 2012-04-08 VITALS — BP 128/88 | HR 91 | Temp 97.9°F | Resp 12 | Ht 68.0 in | Wt 208.4 lb

## 2012-04-08 DIAGNOSIS — Z0000 Encounter for general adult medical examination without abnormal findings: Secondary | ICD-10-CM

## 2012-04-08 DIAGNOSIS — R7989 Other specified abnormal findings of blood chemistry: Secondary | ICD-10-CM

## 2012-04-08 DIAGNOSIS — E291 Testicular hypofunction: Secondary | ICD-10-CM

## 2012-04-08 DIAGNOSIS — Z8249 Family history of ischemic heart disease and other diseases of the circulatory system: Secondary | ICD-10-CM

## 2012-04-08 NOTE — Progress Notes (Signed)
  Subjective:    Patient ID: Anthony [name], male    DOB: [birth year], 46 y.o.   MRN: [id number]  HPI  Mr Spadafore  is here for a physical;acute issues include chronic , recurrent back & shoulder pain      Review of Systems Dr Madelon Lips has referred him for second opinion concerning the persistent shoulder pain. The back pain is right mid to lower in location and intermittent. It is variable in intensity, worse after standing for prolonged period of time. There is no associated urinary or fecal incontinence; limb numbness; tingling; weakness; fever; chills; or sweats; or unexplained weight loss.The spine specialist has injected his spine on one occasion in 2012.           Objective:   Physical Exam Gen.: Healthy and well-nourished in appearance. Alert, appropriate and cooperative throughout exam. Head: Normocephalic without obvious abnormalities; head shaven. Goatee Eyes: No corneal or conjunctival inflammation noted. Pupils equal round reactive to light and accommodation. Fundal exam is benign without hemorrhages, exudate, papilledema. Extraocular motion intact. Vision grossly normal. Ears: External  ear exam reveals no significant lesions or deformities. Wax on L. R TM normal. Hearing is grossly normal bilaterally. Nose: External nasal exam reveals no deformity or inflammation. Nasal mucosa are pink and moist. No lesions or exudates noted.Polypoid changes L nare  Mouth: Oral mucosa and oropharynx reveal no lesions or exudates. Teeth in good repair. Neck: No deformities, masses, or tenderness noted. Range of motion & Thyroid normal. Lungs: Normal respiratory effort; chest expands symmetrically. Lungs are clear to auscultation without rales, wheezes, or increased work of breathing. Heart: Normal rate and rhythm. Normal S1 and S2. No gallop, click, or rub. S4 w/o murmur. Abdomen: Bowel sounds normal; abdomen soft and nontender. No masses, organomegaly or hernias noted. Genitalia/DRE: Genitalia  normal except for left varices. Prostate is normal without enlargement, asymmetry, nodularity, or induration.  Musculoskeletal/extremities: No deformity or scoliosis noted of  the thoracic or lumbar spine. No clubbing, cyanosis, edema, or deformity noted. Range of motion  normal .Tone & strength  normal.Joints normal. Nail health  good. He is able to lie flat and sit up without help. Straight leg raising is negative to 90 bilaterally Vascular: Carotid, radial artery, dorsalis pedis and  posterior tibial pulses are full and equal. No bruits present. Neurologic: Alert and oriented x3. Deep tendon reflexes symmetrical and normal. Gait including heel and toe walking is normal.       Skin: Intact without suspicious lesions or rashes. Lymph: No cervical, axillary, or inguinal lymphadenopathy present. Psych: Mood and affect are normal. Normally interactive                                                                                        Assessment & Plan:  #1 comprehensive physical exam; no acute findings #2 LBP; no neuromuscular deficit; definitive therapy  as per back specialist Plan: see Orders

## 2012-04-08 NOTE — Patient Instructions (Addendum)
Preventive Health Care: Exercise at least 30-45 minutes a day,  3-4 days a week. The best exercises for the low back include freestyle swimming, stretch aerobics, and yoga.  Eat a low-fat diet with lots of fruits and vegetables, up to 7-9 servings per day. Consume less than 40 grams of sugar per day from foods & drinks with High Fructose Corn Sugar as #1,2,3 or # 4 on label. Health Care Power of Attorney & Living Will. Complete if not in place ; these place you in charge of your health care decisions. Please obtain results from the urgent care. Please  schedule fasting Labs IF NOT DONE recently : BMET,Lipids, hepatic panel, CBC & dif, TSH. Please  schedule fasting Labs : BMET,Lipids, hepatic panel, CBC & dif, TSH, PSA. PLEASE BRING THESE INSTRUCTIONS TO FOLLOW UP  LAB APPOINTMENT.This will guarantee correct labs are drawn, eliminating need for repeat blood sampling ( needle sticks ! ). Diagnoses /Codes: V70.0   If you activate My Chart; the results can be released to you as soon as they populate from the lab. If you choose not to use this program; the labs have to be reviewed, copied & mailed   causing a delay in getting the results to you.

## 2012-04-15 ENCOUNTER — Ambulatory Visit (INDEPENDENT_AMBULATORY_CARE_PROVIDER_SITE_OTHER): Payer: Federal, State, Local not specified - PPO | Admitting: Family Medicine

## 2012-04-15 ENCOUNTER — Other Ambulatory Visit (INDEPENDENT_AMBULATORY_CARE_PROVIDER_SITE_OTHER): Payer: Federal, State, Local not specified - PPO

## 2012-04-15 ENCOUNTER — Ambulatory Visit: Payer: Federal, State, Local not specified - PPO | Admitting: Family Medicine

## 2012-04-15 ENCOUNTER — Encounter: Payer: Self-pay | Admitting: Family Medicine

## 2012-04-15 VITALS — BP 122/76 | HR 80 | Temp 98.3°F | Wt 212.0 lb

## 2012-04-15 DIAGNOSIS — Z0000 Encounter for general adult medical examination without abnormal findings: Secondary | ICD-10-CM

## 2012-04-15 DIAGNOSIS — E785 Hyperlipidemia, unspecified: Secondary | ICD-10-CM

## 2012-04-15 DIAGNOSIS — T887XXA Unspecified adverse effect of drug or medicament, initial encounter: Secondary | ICD-10-CM

## 2012-04-15 DIAGNOSIS — Z7251 High risk heterosexual behavior: Secondary | ICD-10-CM

## 2012-04-15 LAB — CBC WITH DIFFERENTIAL/PLATELET
Basophils Absolute: 0 10*3/uL (ref 0.0–0.1)
Basophils Relative: 0.6 % (ref 0.0–3.0)
Eosinophils Absolute: 0.3 10*3/uL (ref 0.0–0.7)
Eosinophils Relative: 4.2 % (ref 0.0–5.0)
HCT: 45.9 % (ref 39.0–52.0)
Hemoglobin: 15.4 g/dL (ref 13.0–17.0)
Lymphocytes Relative: 32.6 % (ref 12.0–46.0)
Lymphs Abs: 2.2 10*3/uL (ref 0.7–4.0)
MCHC: 33.4 g/dL (ref 30.0–36.0)
MCV: 84.1 fl (ref 78.0–100.0)
Monocytes Absolute: 0.7 10*3/uL (ref 0.1–1.0)
Monocytes Relative: 9.8 % (ref 3.0–12.0)
Neutro Abs: 3.6 10*3/uL (ref 1.4–7.7)
Neutrophils Relative %: 52.8 % (ref 43.0–77.0)
Platelets: 248 10*3/uL (ref 150.0–400.0)
RBC: 5.47 Mil/uL (ref 4.22–5.81)
RDW: 15.3 % — ABNORMAL HIGH (ref 11.5–14.6)
WBC: 6.8 10*3/uL (ref 4.5–10.5)

## 2012-04-15 LAB — BASIC METABOLIC PANEL
BUN: 10 mg/dL (ref 6–23)
CO2: 24 mEq/L (ref 19–32)
Calcium: 9.1 mg/dL (ref 8.4–10.5)
Chloride: 104 mEq/L (ref 96–112)
Creatinine, Ser: 1 mg/dL (ref 0.4–1.5)
GFR: 103.81 mL/min (ref >60.00)
Glucose, Bld: 90 mg/dL (ref 70–99)
Potassium: 3.9 mEq/L (ref 3.5–5.1)
Sodium: 138 mEq/L (ref 135–145)

## 2012-04-15 LAB — TSH: TSH: 1.09 u[IU]/mL (ref 0.35–5.50)

## 2012-04-15 LAB — HEPATIC FUNCTION PANEL
ALT: 31 U/L (ref 0–53)
AST: 22 U/L (ref 0–37)
Albumin: 4.3 g/dL (ref 3.5–5.2)
Alkaline Phosphatase: 71 U/L (ref 39–117)
Bilirubin, Direct: 0.1 mg/dL (ref 0.0–0.3)
Total Bilirubin: 0.8 mg/dL (ref 0.3–1.2)
Total Protein: 7.5 g/dL (ref 6.0–8.3)

## 2012-04-15 LAB — LIPID PANEL
Cholesterol: 201 mg/dL — ABNORMAL HIGH (ref 0–200)
HDL: 46.1 mg/dL (ref >39.00)
Total CHOL/HDL Ratio: 4
Triglycerides: 96 mg/dL (ref 0.0–149.0)
VLDL: 19.2 mg/dL (ref 0.0–40.0)

## 2012-04-15 LAB — PSA: PSA: 0.7 ng/mL (ref 0.10–4.00)

## 2012-04-15 LAB — HIV ANTIBODY (ROUTINE TESTING W REFLEX): HIV: NONREACTIVE

## 2012-04-15 LAB — LDL CHOLESTEROL, DIRECT: Direct LDL: 148.7 mg/dL

## 2012-04-15 NOTE — Progress Notes (Signed)
  Subjective:    Patient ID: Anthony [name], male    DOB: 1965-08-01, 46 y.o.   MRN: [id number]  HPI Pt here c/o bump on penis yesterday that has since disappeared.  No penile d/c.  Lesion was not painful.  Pt does have a new partner.   Review of Systems as above   Objective:   Physical Exam  BP 122/76  Pulse 80  Temp 98.3 F (36.8 C) (Oral)  Wt 212 lb (96.163 kg)  SpO2 97% General appearance: alert, cooperative, appears stated age and no distress Male genitalia: normal, penis: no lesions or discharge. testes: no masses or tenderness. no hernias      Assessment & Plan:  High risk behavior-----to lab for std testing,  No lesions on penis today

## 2012-04-16 LAB — GC/CHLAMYDIA PROBE AMP, URINE
Chlamydia, Swab/Urine, PCR: NEGATIVE
GC Probe Amp, Urine: NEGATIVE

## 2012-04-16 LAB — RPR

## 2012-04-18 ENCOUNTER — Telehealth: Payer: Self-pay | Admitting: *Deleted

## 2012-04-18 LAB — HSV 2 ANTIBODY, IGG: HSV 2 Glycoprotein G Ab, IgG: 8.1 IV — ABNORMAL HIGH

## 2012-04-18 MED ORDER — PRAVASTATIN SODIUM 20 MG PO TABS: 20.0000 mg | ORAL_TABLET | Freq: Every day | ORAL | Status: DC

## 2012-04-18 NOTE — Telephone Encounter (Signed)
Pt called triage line wants test results from recent OV.

## 2012-04-18 NOTE — Telephone Encounter (Signed)
Patient recently seen Dr.Lowne

## 2012-04-18 NOTE — Telephone Encounter (Signed)
Patient aware and voiced understanding I reviewed all of the patient's labs not just the one's Dr.Lowne did per patient request.      KP

## 2012-05-13 DIAGNOSIS — L209 Atopic dermatitis, unspecified: Secondary | ICD-10-CM | POA: Insufficient documentation

## 2012-05-28 ENCOUNTER — Encounter (HOSPITAL_COMMUNITY): Payer: Self-pay | Admitting: Emergency Medicine

## 2012-05-28 ENCOUNTER — Emergency Department (HOSPITAL_COMMUNITY)
Admission: EM | Admit: 2012-05-28 | Discharge: 2012-05-28 | Disposition: A | Discharge status: Home / Self Care | Payer: Federal, State, Local not specified - PPO | Source: Home / Self Care

## 2012-05-28 DIAGNOSIS — L739 Follicular disorder, unspecified: Secondary | ICD-10-CM

## 2012-05-28 DIAGNOSIS — L738 Other specified follicular disorders: Secondary | ICD-10-CM

## 2012-05-28 NOTE — ED Provider Notes (Signed)
History     CSN: [id number]  Arrival date & time 05/28/12  1208   None     Chief Complaint  Patient presents with  . Abscess    (Consider location/radiation/quality/duration/timing/severity/associated sxs/prior treatment) HPI Comments: 47 year old male presents with tender bumps on his head for 2 weeks. [facility] went to [facility] approximately 2 weeks ago and was given doxycycline and told it was a skin infection. For some unknown reason he decided not to take the medicine and presents today with the same complaint. There are 6-7 small pustules over the scalp. None appear to be abscessed or need incision and drainage. There is also a solitary lesion on the anterior chest approximately 1 cm in diameter. It has a red base and pustule as well. Denies constitutional symptoms   Past Medical History  Diagnosis Date  . Asthma   . Hyperlipidemia   . H/O renal calculi 2006    Past Surgical History  Procedure Date  . Cystoscopy w/ stone manipulation 2006  . Shoulder surgery 12/17/2010    Dr Madelon Lips ; shoulder impingement & torn tendon  . Hernia repair 1990  . Colonoscopy with polypectomy 2011    Dr Juanda Chance, hemorrhoids  . Cataract extraction 1996    Dr Chales Abrahams; OS    Family History  Problem Relation Age of Onset  . Heart attack Mother 22  . Stroke Father 61  . Diabetes Father     PVD  . Glaucoma Father     blindness  . Cancer Paternal Grandmother     ? primary  . Heart attack Maternal Uncle      X2,pre 55  . Asthma Neg Hx   . COPD Neg Hx     History  Substance Use Topics  . Smoking status: Never Smoker   . Smokeless tobacco: Not on file  . Alcohol Use: Yes     Comment: Socially , < 2X/ month      Review of Systems  HENT: Negative.   Eyes: Negative.   Respiratory: Negative.   Skin:       See history of present illness  Neurological: Negative.   All other systems reviewed and are negative.    Allergies  Review of patient's allergies indicates no known  allergies.  Home Medications   Current Outpatient Rx  Name  Route  Sig  Dispense  Refill  . PRAVASTATIN SODIUM 20 MG PO TABS   Oral   Take 1 tablet (20 mg total) by mouth daily.   90 tablet   0   . TRAMADOL HCL 50 MG PO TABS   Oral   Take 50 mg by mouth every 6 (six) hours as needed. For pain         . ALBUTEROL SULFATE HFA 108 (90 BASE) MCG/ACT IN AERS   Inhalation   Inhale 2 puffs into the lungs every 6 (six) hours as needed. For shortness of breath         . OXYCODONE HCL PO   Oral   Take by mouth.         Marland Kitchen VITAMIN D (ERGOCALCIFEROL) 50000 UNITS PO CAPS   Oral   Take 1 capsule by mouth Once a week.           BP 127/76  Pulse 76  Temp 98 F (36.7 C) (Oral)  Resp 16  SpO2 98%  Physical Exam  Nursing note and vitals reviewed. Constitutional: He is oriented to person, place, and time. He appears well-developed  and well-nourished. No distress.  Neck: Normal range of motion. Neck supple.  Pulmonary/Chest: Effort normal.  Musculoskeletal: Normal range of motion.  Neurological: He is alert and oriented to person, place, and time.  Skin: Skin is warm and dry.       Small follicular pustules on the scalp as described in history of present illness.  Psychiatric: He has a normal mood and affect.    ED Course  Procedures (including critical care time)  Labs Reviewed - No data to display No results found.   1. Folliculitis       MDM  Take doxycycline as directed. He has the prescription.  After shaving the head may clean scalp with rubbing alcohol. Ask the barber to clean shears prior to shaving.        Hayden Rasmussen, NP 05/28/12 1329  Hayden Rasmussen, NP 05/28/12 1331

## 2012-05-28 NOTE — ED Notes (Addendum)
Pt is here for abscess on chest x5 days and bumps on head Sx include: soreness of abscess Denies: drainage, f/v/n/d, itching Saw a doctor at [facility] and was Rx doxycycline and was told it was hair follicles??? Has not p/u meds yet  He is alert w/no signs of acute distress.

## 2012-05-29 NOTE — ED Provider Notes (Signed)
Medical screening examination/treatment/procedure(s) were performed by resident physician or non-physician practitioner and as supervising physician I was immediately available for consultation/collaboration.   Acquanetta Cabanilla DOUGLAS MD.    Lalita Ebel D Takuma Cifelli, MD 05/29/12 1205 

## 2012-06-03 ENCOUNTER — Ambulatory Visit (INDEPENDENT_AMBULATORY_CARE_PROVIDER_SITE_OTHER): Payer: Federal, State, Local not specified - PPO | Admitting: Internal Medicine

## 2012-06-03 ENCOUNTER — Encounter: Payer: Self-pay | Admitting: Internal Medicine

## 2012-06-03 ENCOUNTER — Ambulatory Visit (INDEPENDENT_AMBULATORY_CARE_PROVIDER_SITE_OTHER)
Admission: RE | Admit: 2012-06-03 | Discharge: 2012-06-03 | Disposition: A | Discharge status: Home / Self Care | Payer: Federal, State, Local not specified - PPO | Source: Ambulatory Visit | Attending: Internal Medicine | Admitting: Internal Medicine

## 2012-06-03 VITALS — BP 110/80 | HR 74 | Ht 67.0 in | Wt 216.6 lb

## 2012-06-03 DIAGNOSIS — J45998 Other asthma: Secondary | ICD-10-CM

## 2012-06-03 DIAGNOSIS — J45909 Unspecified asthma, uncomplicated: Secondary | ICD-10-CM

## 2012-06-03 MED ORDER — MOMETASONE FURO-FORMOTEROL FUM 100-5 MCG/ACT IN AERO: INHALATION_SPRAY | RESPIRATORY_TRACT | Status: DC

## 2012-06-03 MED ORDER — MOMETASONE FURO-FORMOTEROL FUM 100-5 MCG/ACT IN AERO: 2.0000 | INHALATION_SPRAY | Freq: Two times a day (BID) | RESPIRATORY_TRACT | Status: DC

## 2012-06-03 NOTE — Patient Instructions (Addendum)
Sample and script to start Dulera 100 maintenance controller inhaler     2 puffs then rinse mouth, twice daily. Leave this at home on bathroom sink and do it before you brush your teeth  You can still use an albuterol rescue inhaler (Proventil, Ventolin, Proair) 2 puffs, up to 4 times daily for wheezing if needed  Order- CXR  Dx asthma

## 2012-06-03 NOTE — Progress Notes (Signed)
09/25/11- 45 yoM never smoker, referred by Dr. Alwyn Ren for allergy evaluation. He had hayfever with asthma as a child, sinus infections and seasonal rhinitis with sneezing chest tightness and shortness of breath. While in the [employer] in [place] in the late 1980s, he had a bronchial pneumonia. Subsequent episodes of wheezing and cough were called asthma. He was followed by Dr Stevphen Rochester years ago but those records are no longer available. He was on allergy vaccine or one year. He recently had exacerbation and had to leave work 6 days ago, going to the emergency room for nebulizer treatment with symptoms of an upper respiratory infection. The next day he was seen at an urgent care and treated with prednisone. He has been out of work all week. His usual management includes Symbicort and at least daily use of a rescue inhaler. He is finishing a course of prednisone. Nasal discharge is clear yellow. He had frontal headache early in the week, now resolved. No ENT surgery. No problem with latex, contrast dye, aspirin, specific foods. He has had eczema. 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 Investment banker, operational, getting medications from [facility] CXR-02/01/09- reviewed- IMPRESSION:  No active lung disease. Question bronchitis.  Provider: [name]  11/12/11- 45 yoM never smoker followed for allergic rhinitis, asthma Review PFT results with patient; feels like everything has cleared up since last visit and with using Dymista(did not want Rx) Allergy Profile 09/25/2011-total IgE 159.6 with multiple specific elevations especially for grass pollens, molds, tree pollens and ragweed. PFT: 10/08/2011-mild obstructive airways disease-small airways, insignificant response to bronchodilator.  06/03/12- 45 yoM never smoker followed for allergic rhinitis, asthma Follows for : pt states still having some wheezing,sob,with increase activity denies any coughing. Pt stated sample of  dymista seemed to help  Brings FMLA form for completion. Still a lot of tight wheezing most days worse with cold air, sexual activity, exertion. Short of breath with exertion. Not much rhinitis. Still has Symbicort 160. Using Ventolin rescue inhaler occasionally.  ROS-see HPI Constitutional:   No-   weight loss, night sweats, fevers, chills, fatigue, lassitude. HEENT:   No-  headaches, difficulty swallowing, tooth/dental problems, sore throat,       No-  sneezing, itching, ear ache, +less nasal congestion, post nasal drip,  CV:  No- chest pain, No- orthopnea, PND, swelling in lower extremities, anasarca, dizziness, palpitations Resp: +shortness of breath with exertion or at rest.              No-  productive cough,  No non-productive cough,  No- coughing up of blood.                change in color of mucus.  No- wheezing.   Skin: No-   rash or lesions. GI:  No-   heartburn, indigestion, abdominal pain, nausea, vomiting, GU:, MS:  No-   joint pain or swelling.   Neuro-     nothing unusual Psych:  No- change in mood or affect. No depression or anxiety.  No memory loss.  OBJ- Physical Exam General- Alert, Oriented, Affect-appropriate, Distress- none acute Skin- rash-none, lesions- none, excoriation- none Lymphadenopathy- none Head- atraumatic            Eyes- Gross vision intact, PERRLA, conjunctivae and secretions clear            Ears- Hearing, canals-normal            Nose- looks clear, no-Septal dev, polyps, erosion,  perforation             Throat- Mallampati III , mucosa clear , drainage- none, tonsils- atrophic Neck- flexible , trachea midline, no stridor , thyroid nl, carotid no bruit Chest - symmetrical excursion , unlabored           Heart/CV- RRR , no murmur , no gallop  , no rub, nl s1 s2                           - JVD- none , edema- none, stasis changes- none, varices- none           Lung- clear to P&A, wheeze- none, cough- none , dullness-none, rub- none           Chest  wall-  Abd-  Br/ Gen/ Rectal- Not done, not indicated Extrem- cyanosis- none, clubbing, none, atrophy- none, strength- nl Neuro- grossly intact to observation

## 2012-06-07 ENCOUNTER — Ambulatory Visit (INDEPENDENT_AMBULATORY_CARE_PROVIDER_SITE_OTHER): Payer: Federal, State, Local not specified - PPO | Admitting: Internal Medicine

## 2012-06-07 ENCOUNTER — Encounter: Payer: Self-pay | Admitting: Internal Medicine

## 2012-06-07 VITALS — BP 128/84 | HR 91 | Temp 97.4°F | Wt 215.0 lb

## 2012-06-07 DIAGNOSIS — S8990XA Unspecified injury of unspecified lower leg, initial encounter: Secondary | ICD-10-CM

## 2012-06-07 DIAGNOSIS — M2142 Flat foot [pes planus] (acquired), left foot: Secondary | ICD-10-CM

## 2012-06-07 DIAGNOSIS — G8929 Other chronic pain: Secondary | ICD-10-CM

## 2012-06-07 DIAGNOSIS — M79609 Pain in unspecified limb: Secondary | ICD-10-CM

## 2012-06-07 DIAGNOSIS — S99929A Unspecified injury of unspecified foot, initial encounter: Secondary | ICD-10-CM

## 2012-06-07 DIAGNOSIS — M545 Low back pain, unspecified: Secondary | ICD-10-CM

## 2012-06-07 DIAGNOSIS — M2140 Flat foot [pes planus] (acquired), unspecified foot: Secondary | ICD-10-CM

## 2012-06-07 DIAGNOSIS — M79652 Pain in left thigh: Secondary | ICD-10-CM

## 2012-06-07 DIAGNOSIS — S8991XA Unspecified injury of right lower leg, initial encounter: Secondary | ICD-10-CM

## 2012-06-07 DIAGNOSIS — M2141 Flat foot [pes planus] (acquired), right foot: Secondary | ICD-10-CM

## 2012-06-07 MED ORDER — GABAPENTIN 100 MG PO CAPS: ORAL_CAPSULE | ORAL | Status: DC

## 2012-06-07 NOTE — Patient Instructions (Addendum)
Review and correct the record as indicated. Please share record with all medical staff seen.  
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
## 2012-06-09 NOTE — Progress Notes (Signed)
Quick Note:  ATC no answer and unable to leave message. ______ 

## 2012-06-12 NOTE — Assessment & Plan Note (Addendum)
Plan- chest x-ray, adding maintenance controller Dulera 100

## 2012-06-14 ENCOUNTER — Telehealth: Payer: Self-pay | Admitting: Internal Medicine

## 2012-06-14 NOTE — Progress Notes (Signed)
Quick Note:  LMTCB ______ 

## 2012-06-14 NOTE — Telephone Encounter (Signed)
Pt returned my call; requested I mail him the original of his FMLA paperwork. I have placed in mail and patient aware. Copy made to scan in EPIC for our records.

## 2012-06-15 NOTE — Progress Notes (Signed)
Quick Note:  LMTCB ______ 

## 2012-06-22 NOTE — Progress Notes (Signed)
Quick Note:  LMTCB ______ 

## 2012-06-29 ENCOUNTER — Encounter: Payer: Self-pay | Admitting: Internal Medicine

## 2012-06-30 ENCOUNTER — Encounter: Payer: Self-pay | Admitting: *Deleted

## 2012-06-30 NOTE — Progress Notes (Signed)
Quick Note:  Letter sent to patient to contact our office after several attempts to contact patient. ______

## 2012-07-15 ENCOUNTER — Telehealth: Payer: Self-pay | Admitting: Internal Medicine

## 2012-07-15 NOTE — Telephone Encounter (Signed)
In reference to Sports Med referral to Enid Baas from 06/07/12, patient has not been seen.  Dr. Darrick Penna office attempted to reach patient to schedule, as have I on numerous occasions, and mailed him a letter.  Patient will not respond.

## 2012-08-05 ENCOUNTER — Ambulatory Visit: Payer: Federal, State, Local not specified - PPO | Admitting: Internal Medicine

## 2012-08-24 ENCOUNTER — Telehealth: Payer: Self-pay | Admitting: Internal Medicine

## 2012-08-24 NOTE — Telephone Encounter (Signed)
ATC patient, no answer LMOMTCB 

## 2012-08-25 NOTE — Telephone Encounter (Signed)
ATC PT, NO ANSWER LMOMTCB

## 2012-08-26 NOTE — Telephone Encounter (Signed)
Notes Recorded by Waymon Budge, MD on 06/03/2012 at 1:25 PM CXR- mild bronchitis changes  -----  Called, spoke with pt.  Informed him of cxr results as stated above per CDY.  He verbalized understanding..  Pt states on Friday he started to have itchy eyes, sneezing, and a stratchy throat.  Denies PND or any other symptoms at this time.  He picked up zyrtec yesterday and would like to try this first.  Pt will call back if symptoms do not improve or worsen.  He voiced no further questions or concerns at this time.

## 2012-09-02 ENCOUNTER — Encounter: Payer: Self-pay | Admitting: Family Medicine

## 2012-09-02 ENCOUNTER — Ambulatory Visit (INDEPENDENT_AMBULATORY_CARE_PROVIDER_SITE_OTHER): Payer: Federal, State, Local not specified - PPO | Admitting: Family Medicine

## 2012-09-02 VITALS — BP 110/76 | HR 107 | Temp 98.5°F | Wt 210.6 lb

## 2012-09-02 DIAGNOSIS — R000 Tachycardia, unspecified: Secondary | ICD-10-CM

## 2012-09-02 DIAGNOSIS — R319 Hematuria, unspecified: Secondary | ICD-10-CM

## 2012-09-02 DIAGNOSIS — G8929 Other chronic pain: Secondary | ICD-10-CM

## 2012-09-02 DIAGNOSIS — R1011 Right upper quadrant pain: Secondary | ICD-10-CM

## 2012-09-02 LAB — POCT URINALYSIS DIPSTICK
Bilirubin, UA: NEGATIVE
Glucose, UA: NEGATIVE
Leukocytes, UA: NEGATIVE
Nitrite, UA: NEGATIVE
Protein, UA: NEGATIVE
Spec Grav, UA: 1.02
Urobilinogen, UA: 0.2
pH, UA: 6.5

## 2012-09-02 LAB — HEPATIC FUNCTION PANEL
ALT: 32 U/L (ref 0–53)
AST: 22 U/L (ref 0–37)
Albumin: 4.1 g/dL (ref 3.5–5.2)
Alkaline Phosphatase: 71 U/L (ref 39–117)
Bilirubin, Direct: 0.1 mg/dL (ref 0.0–0.3)
Indirect Bilirubin: 0.5 mg/dL (ref 0.0–0.9)
Total Bilirubin: 0.6 mg/dL (ref 0.3–1.2)
Total Protein: 7.1 g/dL (ref 6.0–8.3)

## 2012-09-02 LAB — CBC WITH DIFFERENTIAL/PLATELET
Basophils Absolute: 0 10*3/uL (ref 0.0–0.1)
Basophils Relative: 0 % (ref 0–1)
Eosinophils Absolute: 0.4 10*3/uL (ref 0.0–0.7)
Eosinophils Relative: 6 % — ABNORMAL HIGH (ref 0–5)
HCT: 43.8 % (ref 39.0–52.0)
Hemoglobin: 15.4 g/dL (ref 13.0–17.0)
Lymphocytes Relative: 39 % (ref 12–46)
Lymphs Abs: 2.8 10*3/uL (ref 0.7–4.0)
MCH: 27.6 pg (ref 26.0–34.0)
MCHC: 35.2 g/dL (ref 30.0–36.0)
MCV: 78.5 fL (ref 78.0–100.0)
Monocytes Absolute: 0.7 10*3/uL (ref 0.1–1.0)
Monocytes Relative: 10 % (ref 3–12)
Neutro Abs: 3.2 10*3/uL (ref 1.7–7.7)
Neutrophils Relative %: 45 % (ref 43–77)
Platelets: 258 10*3/uL (ref 150–400)
RBC: 5.58 MIL/uL (ref 4.22–5.81)
RDW: 16.6 % — ABNORMAL HIGH (ref 11.5–15.5)
WBC: 7.1 10*3/uL (ref 4.0–10.5)

## 2012-09-02 LAB — BASIC METABOLIC PANEL
BUN: 10 mg/dL (ref 6–23)
CO2: 24 mEq/L (ref 19–32)
Calcium: 9.5 mg/dL (ref 8.4–10.5)
Chloride: 107 mEq/L (ref 96–112)
Creat: 1.17 mg/dL (ref 0.50–1.35)
Glucose, Bld: 81 mg/dL (ref 70–99)
Potassium: 4 mEq/L (ref 3.5–5.3)
Sodium: 139 mEq/L (ref 135–145)

## 2012-09-02 MED ORDER — HYDROCODONE-ACETAMINOPHEN 5-325 MG PO TABS: 1.0000 | ORAL_TABLET | Freq: Four times a day (QID) | ORAL | Status: DC | PRN

## 2012-09-02 NOTE — Patient Instructions (Addendum)
Flank Pain  Flank pain refers to pain that is located on the side of the body between the upper abdomen and the back. It can be caused by many things.  CAUSES   Some of the more common causes of flank pain include:   Muscle strain.   Muscle spasms.   A disease of your spine (vertebral disk disease).   A lung infection (pneumonia).   Fluid around your lungs (pulmonary edema).   A kidney infection.   Kidney stones.   A very painful skin rash on only one side of your body (shingles).   Gallbladder disease.  DIAGNOSIS   Blood tests, urine tests, and X-rays may help your caregiver determine what is wrong.  TREATMENT   The treatment of pain depends on the cause. Your caregiver will determine what treatment will work best for you.  HOME CARE INSTRUCTIONS    Home care will depend on the cause of your pain.   Some medications may help relieve the pain. Take medication for relief of pain as directed by your caregiver.   Tell your caregiver about any changes in your pain.   Follow up with your caregiver.  SEEK IMMEDIATE MEDICAL CARE IF:    Your pain is not controlled with medication.   The pain increases.   You have abdominal pain.   You have shortness of breath.   You have persistent nausea or vomiting.   You have swelling in your abdomen.   You feel faint or pass out.   You have a temperature by mouth above 102 F (38.9 C), not controlled by medicine.  MAKE SURE YOU:    Understand these instructions.   Will watch your condition.   Will get help right away if you are not doing well or get worse.  Document Released: 06/11/2005 Document Revised: 07/13/2011 Document Reviewed: 10/05/2009  Exit[employer] Patient Information 2013 [employer].

## 2012-09-02 NOTE — Progress Notes (Signed)
  Subjective:     Anthony [name] is a 47 y.o. male who presents for evaluation of abdominal pain. Onset was several months ago. Symptoms have been unchanged. The pain is described as cramping and sharp, and is 4/10 in intensity. Pain is located in the RUQ and r flank without radiation.  Aggravating factors: none.  Alleviating factors: none. Associated symptoms: hematuria. The patient denies anorexia, arthralagias, belching, chills, constipation, diarrhea, dysuria, fever, flatus, frequency, headache, hematochezia, melena, myalgias, nausea, sweats and vomiting.  The patient's history has been marked as reviewed and updated as appropriate.  Review of Systems Pertinent items are noted in HPI.     Objective:    BP 110/76  Pulse 107  Temp(Src) 98.5 F (36.9 C) (Oral)  Wt 210 lb 9.6 oz (95.528 kg)  BMI 32.98 kg/m2  SpO2 9% General appearance: alert, cooperative, appears stated age and no distress Back: symmetric, no curvature. ROM normal. No CVA tenderness. Lungs: clear to auscultation bilaterally Heart: S1, S2 normal Abdomen: soft, non-tender; bowel sounds normal; no masses,  no organomegaly    Assessment:    Abdominal pain, likely secondary to kidney stone or GB .    Plan:    The diagnosis was discussed with the patient and evaluation and treatment plans outlined. See orders for lab and imaging studies. Further follow-up plans will be based on outcome of lab/imaging studies; see orders. Follow up as needed.

## 2012-09-04 LAB — URINE CULTURE
Colony Count: NO GROWTH
Organism ID, Bacteria: NO GROWTH

## 2012-09-09 ENCOUNTER — Telehealth: Payer: Self-pay | Admitting: Internal Medicine

## 2012-09-09 DIAGNOSIS — R109 Unspecified abdominal pain: Secondary | ICD-10-CM

## 2012-09-09 NOTE — Telephone Encounter (Signed)
Pt notified that as of this time we can only speculate that the pain is caused by stones will not know until [place] is done on Monday. UA scheduled and orders placed.

## 2012-09-09 NOTE — Telephone Encounter (Signed)
The blood was felt to be related to a kidney stone possibly. You should strain the urine using an inexpensive tea strainer you can buy at the store such as [employer] or [employer]. Drink to thirst, up to 40 ounces a day. The urinalysis should be repeated in 7-10 days.

## 2012-09-09 NOTE — Telephone Encounter (Signed)
Pt would like to know what could have caused the Blood in the UA that was seen on the initial UA. Pt would like to know if this is something he should be worried about.Please advise   Called Pt back discuss lab result which Pt has since went on to Mychart and viewed.  Read by [name] at 09/09/2012 9:06 AM Great! con't meds and recheck cholestrol in 6 months

## 2012-09-09 NOTE — Telephone Encounter (Signed)
Patient called requesting that someone go over his lab results with him. Results were released on MyChart, but patient has some questions.

## 2012-09-12 ENCOUNTER — Other Ambulatory Visit: Payer: Self-pay | Admitting: Family Medicine

## 2012-09-12 ENCOUNTER — Ambulatory Visit
Admission: RE | Admit: 2012-09-12 | Discharge: 2012-09-12 | Disposition: A | Discharge status: Home / Self Care | Payer: Federal, State, Local not specified - PPO | Source: Ambulatory Visit | Attending: Family Medicine | Admitting: Family Medicine

## 2012-09-12 DIAGNOSIS — R109 Unspecified abdominal pain: Secondary | ICD-10-CM

## 2012-09-12 DIAGNOSIS — G8929 Other chronic pain: Secondary | ICD-10-CM

## 2012-09-12 DIAGNOSIS — R319 Hematuria, unspecified: Secondary | ICD-10-CM

## 2012-09-15 ENCOUNTER — Encounter: Payer: Self-pay | Admitting: Lab

## 2012-09-15 ENCOUNTER — Other Ambulatory Visit: Payer: Self-pay

## 2012-09-15 ENCOUNTER — Ambulatory Visit (INDEPENDENT_AMBULATORY_CARE_PROVIDER_SITE_OTHER)
Admission: RE | Admit: 2012-09-15 | Discharge: 2012-09-15 | Disposition: A | Discharge status: Home / Self Care | Payer: Federal, State, Local not specified - PPO | Source: Ambulatory Visit | Attending: Family Medicine | Admitting: Family Medicine

## 2012-09-15 DIAGNOSIS — R109 Unspecified abdominal pain: Secondary | ICD-10-CM

## 2012-09-15 MED ORDER — DOXYCYCLINE HYCLATE 100 MG PO TABS: 100.0000 mg | ORAL_TABLET | Freq: Two times a day (BID) | ORAL | Status: DC

## 2012-09-16 ENCOUNTER — Other Ambulatory Visit (INDEPENDENT_AMBULATORY_CARE_PROVIDER_SITE_OTHER): Payer: Federal, State, Local not specified - PPO

## 2012-09-16 ENCOUNTER — Encounter: Payer: Self-pay | Admitting: Lab

## 2012-09-16 DIAGNOSIS — R319 Hematuria, unspecified: Secondary | ICD-10-CM

## 2012-09-16 LAB — URINALYSIS
Bilirubin Urine: NEGATIVE
Ketones, ur: NEGATIVE
Leukocytes, UA: NEGATIVE
Nitrite: NEGATIVE
Specific Gravity, Urine: 1.025 (ref 1.000–1.030)
Total Protein, Urine: NEGATIVE
Urine Glucose: NEGATIVE
Urobilinogen, UA: 0.2 (ref 0.0–1.0)
pH: 6 (ref 5.0–8.0)

## 2012-09-28 ENCOUNTER — Ambulatory Visit (INDEPENDENT_AMBULATORY_CARE_PROVIDER_SITE_OTHER): Payer: Federal, State, Local not specified - PPO | Admitting: Internal Medicine

## 2012-09-28 ENCOUNTER — Encounter: Payer: Self-pay | Admitting: Internal Medicine

## 2012-09-28 ENCOUNTER — Other Ambulatory Visit: Payer: Self-pay | Admitting: Internal Medicine

## 2012-09-28 VITALS — BP 130/88 | HR 99 | Temp 97.7°F | Wt 209.0 lb

## 2012-09-28 DIAGNOSIS — K760 Fatty (change of) liver, not elsewhere classified: Secondary | ICD-10-CM | POA: Insufficient documentation

## 2012-09-28 DIAGNOSIS — M47817 Spondylosis without myelopathy or radiculopathy, lumbosacral region: Secondary | ICD-10-CM | POA: Insufficient documentation

## 2012-09-28 DIAGNOSIS — R3129 Other microscopic hematuria: Secondary | ICD-10-CM

## 2012-09-28 DIAGNOSIS — R1011 Right upper quadrant pain: Secondary | ICD-10-CM

## 2012-09-28 DIAGNOSIS — M47816 Spondylosis without myelopathy or radiculopathy, lumbar region: Secondary | ICD-10-CM | POA: Insufficient documentation

## 2012-09-28 DIAGNOSIS — K7689 Other specified diseases of liver: Secondary | ICD-10-CM

## 2012-09-28 DIAGNOSIS — E785 Hyperlipidemia, unspecified: Secondary | ICD-10-CM

## 2012-09-28 LAB — POCT URINALYSIS DIPSTICK
Bilirubin, UA: NEGATIVE
Glucose, UA: NEGATIVE
Ketones, UA: NEGATIVE
Leukocytes, UA: NEGATIVE
Nitrite, UA: NEGATIVE
Protein, UA: NEGATIVE
Spec Grav, UA: 1.02
Urobilinogen, UA: 0.2
pH, UA: 6

## 2012-09-28 MED ORDER — HYOSCYAMINE SULFATE 0.125 MG SL SUBL: 0.1250 mg | SUBLINGUAL_TABLET | Freq: Four times a day (QID) | SUBLINGUAL | Status: DC | PRN

## 2012-09-28 NOTE — Addendum Note (Signed)
Addended by: Maurice Small on: 09/28/2012 02:00 PM   Modules accepted: Orders

## 2012-09-28 NOTE — Addendum Note (Signed)
Addended by: Maurice Small on: 09/28/2012 02:52 PM   Modules accepted: Orders

## 2012-09-28 NOTE — Patient Instructions (Addendum)
Fatty liver is almost always due to excess intake of sugar from High Fructose Corn Syrup added to foods & drinks.HFCS is converted into TG (essentially liquid fat) by the liver. .After all intra-abdominal binding sites are saturated ,  TG then enter the liver . TG infiltrate around the liver cells , compressing them & causing release of AST & ALT, liver enzymes. This condition is called hepatic steatosis or  "fatty liver". You should consume as little HFCS sugar as possible, but @ least < 40 grams per day from foods & drinks with HFCS as #1,2,3, or #4 on the label.

## 2012-09-28 NOTE — Progress Notes (Signed)
  Subjective:    Patient ID: Anthony [name], male    DOB: 10-07-1965, 47 y.o.   MRN: [id number]  HPI  He presents with exacerbation right upper quadrant pain which has actually been present intermittently over the past 2 years.  He experienced a flare 5/26 with sharp pain up to a level VIII. He did take either tramadol or narcotic pain medication; he is unsure whether this helped; but his pain was less severe yesterday, level VI.  He has had an ultrasound 09/02/12 as well as a CT of the abdomen and pelvis 5/15 which were normal or negative.  Remotely in late 2012 he had an ultrasound which was also negative. Additionally a gallbladder functional scan was negative.  He does have a history of spondylosis at L5. He is being seen by Dr. Retia Passe, pain management and is prescribed generic Flexeril and narcotic pain medications    Review of Systems   He denies significant dyspepsia, dysphagia, melena, unexplained weight loss, or rectal bleeding.  He has had some microscopic hematuria and was prescribed doxycycline which he recently completed.  He denies dysuria or pyuria.  He's had no associated rash or radicular pain radiating from the back to the right upper quadrant     Objective:   Physical Exam  General appearance is one of good health and nourishment w/o distress.  Eyes: No conjunctival inflammation or scleral icterus is present.  Oral exam: Dental hygiene is good; lips and gums are healthy appearing.There is no oropharyngeal erythema or exudate noted.   Heart:  Normal rate and regular rhythm. S1 and S2 normal without gallop, murmur, click, rub or other extra sounds.S4   Lungs:Chest clear to auscultation; no wheezes, rhonchi,rales ,or rubs present.No increased work of breathing.   Abdomen: bowel sounds normal, soft and non-tender without masses, organomegaly or hernias noted.  No guarding or rebound   Skin:Warm & dry.  Intact without suspicious lesions or rashes ; no   tenting  Lymphatic: No lymphadenopathy is noted about the head, neck, axilla.   Straight leg raising is negative           Assessment & Plan:  #1 right upper quadrant pain intermittent over the past 2 years with negative gallbladder imaging and functional scans. 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  Clinical historically a T6 radicular pain is not suggested even though he does have L5 spondylosis.  #2 microscopic hematuria, unrelated  Plan: #1 GI referral  #2 Levsin sublingually as needed  #3 UA

## 2012-09-29 ENCOUNTER — Encounter: Payer: Self-pay | Admitting: Internal Medicine

## 2012-09-30 LAB — URINE CULTURE
Colony Count: NO GROWTH
Organism ID, Bacteria: NO GROWTH

## 2012-10-03 ENCOUNTER — Encounter: Payer: Self-pay | Admitting: Internal Medicine

## 2012-10-06 ENCOUNTER — Other Ambulatory Visit: Payer: Self-pay | Admitting: Internal Medicine

## 2012-10-06 ENCOUNTER — Telehealth: Payer: Self-pay | Admitting: General Practice

## 2012-10-06 DIAGNOSIS — R3129 Other microscopic hematuria: Secondary | ICD-10-CM

## 2012-10-06 DIAGNOSIS — R319 Hematuria, unspecified: Secondary | ICD-10-CM

## 2012-10-06 NOTE — Telephone Encounter (Signed)
Patient called in stating that he would also like doxyclicline sent to the pharmacy

## 2012-10-06 NOTE — Telephone Encounter (Signed)
Pt called in regards to the MyChart message he received on his last labs. Stated he would like a referral to urology (orders placed), also states that he is having the urination frequency again and that his penis is achy from urinating so much.

## 2012-10-07 NOTE — Telephone Encounter (Signed)
Called Pt back to see why he requesting to have Rx for Doxy. Pt states that he was still having urinary symptoms but as of today he is doing fine so he does not need any med at this time.Last OV 09-28-12. Pt advise to give [place] a call if symptoms return so that we can check his urine. Pt ok. Pt made aware referral placed.

## 2012-10-28 ENCOUNTER — Encounter: Payer: Self-pay | Admitting: *Deleted

## 2012-11-29 ENCOUNTER — Ambulatory Visit: Payer: Federal, State, Local not specified - PPO | Admitting: Internal Medicine

## 2012-12-06 ENCOUNTER — Ambulatory Visit (INDEPENDENT_AMBULATORY_CARE_PROVIDER_SITE_OTHER): Payer: Federal, State, Local not specified - PPO | Admitting: Internal Medicine

## 2012-12-06 ENCOUNTER — Encounter: Payer: Self-pay | Admitting: Internal Medicine

## 2012-12-06 VITALS — BP 130/82 | HR 75 | Temp 97.7°F | Wt 208.2 lb

## 2012-12-06 DIAGNOSIS — N453 Epididymo-orchitis: Secondary | ICD-10-CM

## 2012-12-06 DIAGNOSIS — R319 Hematuria, unspecified: Secondary | ICD-10-CM

## 2012-12-06 DIAGNOSIS — N451 Epididymitis: Secondary | ICD-10-CM

## 2012-12-06 LAB — POCT URINALYSIS DIPSTICK
Bilirubin, UA: NEGATIVE
Glucose, UA: NEGATIVE
Ketones, UA: NEGATIVE
Leukocytes, UA: NEGATIVE
Nitrite, UA: NEGATIVE
Protein, UA: NEGATIVE
Spec Grav, UA: 1.01
Urobilinogen, UA: 0.2
pH, UA: 6.5

## 2012-12-06 MED ORDER — DOXYCYCLINE HYCLATE 100 MG PO TABS: 100.0000 mg | ORAL_TABLET | Freq: Two times a day (BID) | ORAL | Status: DC

## 2012-12-06 NOTE — Patient Instructions (Addendum)
Drink as much nondairy fluids as possible. Avoid spicy foods or alcohol as  these may aggravate the prostate. Do not take decongestants. Avoid narcotics if possible.Fill tub with hot water and soak in it twice a day to help relieve the  pain.

## 2012-12-06 NOTE — Progress Notes (Signed)
  Subjective:    Patient ID: Anthony [name], male    DOB: Aug 15, 1965, 47 y.o.   MRN: [id number]  HPI  Early 12/05/12 of R. subcutaneous a.m. he developed diffuse aching and what he describes as a "funny feeling" in the genital area. This was associated with frequency and chills. He had one episode of pain in his testicles when he turned over in bed.  In May of this year he was seen for microscopic hematuria. He did receive doxycycline for this. CT of the abdomen and ultrasound were negative. He had been referred to the urologist; that appointment was not completed. He does have a followup appointment with Dr. Normajean Baxter later this month Review of Systems  He denies fever or sweats. He has not seen hematuria and denies dysuria or pyuria. He also denies abdominal pain, constipation, diarrhea, melena, or rectal bleeding.     Objective:   Physical Exam  General appearance is one of good health and nourishment w/o distress.  Eyes: No conjunctival inflammation or scleral icterus is present.  Oral exam: Dental hygiene is good; lips and gums are healthy appearing.There is no oropharyngeal erythema or exudate noted.   Heart:  Normal rate and regular rhythm. S1 and S2 normal without gallop, murmur, click, rub or other extra sounds     Lungs:Chest clear to auscultation; no wheezes, rhonchi,rales ,or rubs present.No increased work of breathing.   Abdomen: bowel sounds normal, soft and non-tender without masses, organomegaly or hernias noted.  No guarding or rebound   Genital exams unremarkable except for significant tenderness in the right epididymal area  Skin:Warm & dry.  Intact without suspicious lesions or rashes   Lymphatic: No lymphadenopathy is noted about the head, neck, axilla            Assessment & Plan:  #1 epididymitis , R See orders

## 2012-12-12 ENCOUNTER — Telehealth: Payer: Self-pay | Admitting: Internal Medicine

## 2012-12-12 NOTE — Telephone Encounter (Signed)
If symptoms persist after completing the doxycycline; please repeat clean catch urine for dip and culture and sensitivity

## 2012-12-12 NOTE — Telephone Encounter (Signed)
Noted, will pass information along to Dr.Hopper

## 2012-12-12 NOTE — Telephone Encounter (Signed)
Patient states that he is returning a call from Chrae. Wants to let her know that he has an appointment with the Urologist on Sept 5, 2014. He also wants to let her know that he still has aching in his legs and is still taking his medication he was given for this. Says he has about 3 more days remaining of his medication.

## 2012-12-13 NOTE — Telephone Encounter (Signed)
Left message on voice-mail with Dr. Frederik Pear recommendations

## 2013-01-18 ENCOUNTER — Telehealth: Payer: Self-pay | Admitting: Internal Medicine

## 2013-01-18 NOTE — Telephone Encounter (Signed)
Message copied by Arna Snipe on Wed Jan 18, 2013  2:19 PM ------      Message from: Richardson Chiquito      Created: Wed Nov 30, 2012  8:00 AM                   ----- Message -----         From: Hart Carwin, MD         Sent: 11/29/2012   9:38 PM           To: Richardson Chiquito, CMA            Please charge no show      ----- Message -----         From: Richardson Chiquito, CMA         Sent: 11/29/2012   3:39 PM           To: Hart Carwin, MD            Patient no showed appointment with Dr Juanda Chance on 11/2912. Dr Juanda Chance, do you want to charge no show fee?       ------

## 2013-01-25 ENCOUNTER — Telehealth: Payer: Self-pay | Admitting: *Deleted

## 2013-01-25 ENCOUNTER — Other Ambulatory Visit: Payer: Self-pay | Admitting: Internal Medicine

## 2013-01-25 DIAGNOSIS — R319 Hematuria, unspecified: Secondary | ICD-10-CM

## 2013-01-25 NOTE — Telephone Encounter (Signed)
Triage line message: patient called and left message stating that he is experiencing hematuria. Please advise.

## 2013-01-25 NOTE — Telephone Encounter (Signed)
Per Dr. Alwyn Ren, called and spoke with patient regarding hematuria. Dr. Alwyn Ren would like for patient to set up an appointment with his urologist. Patient stated his understanding.

## 2013-02-17 DIAGNOSIS — Z8614 Personal history of Methicillin resistant Staphylococcus aureus infection: Secondary | ICD-10-CM | POA: Insufficient documentation

## 2013-02-27 ENCOUNTER — Ambulatory Visit (INDEPENDENT_AMBULATORY_CARE_PROVIDER_SITE_OTHER): Payer: Federal, State, Local not specified - PPO | Admitting: Internal Medicine

## 2013-02-27 ENCOUNTER — Encounter: Payer: Self-pay | Admitting: Internal Medicine

## 2013-02-27 VITALS — BP 122/74 | HR 84 | Ht 67.0 in | Wt 210.0 lb

## 2013-02-27 DIAGNOSIS — R071 Chest pain on breathing: Secondary | ICD-10-CM

## 2013-02-27 DIAGNOSIS — J45998 Other asthma: Secondary | ICD-10-CM

## 2013-02-27 DIAGNOSIS — J309 Allergic rhinitis, unspecified: Secondary | ICD-10-CM

## 2013-02-27 DIAGNOSIS — R0789 Other chest pain: Secondary | ICD-10-CM

## 2013-02-27 DIAGNOSIS — J302 Other seasonal allergic rhinitis: Secondary | ICD-10-CM

## 2013-02-27 DIAGNOSIS — J45909 Unspecified asthma, uncomplicated: Secondary | ICD-10-CM

## 2013-02-27 MED ORDER — METHYLPREDNISOLONE ACETATE 80 MG/ML IJ SUSP: 80.0000 mg | Freq: Once | INTRAMUSCULAR | Status: DC

## 2013-02-27 MED ORDER — ALBUTEROL SULFATE (2.5 MG/3ML) 0.083% IN NEBU: 2.5000 mg | INHALATION_SOLUTION | Freq: Once | RESPIRATORY_TRACT | Status: DC

## 2013-02-27 NOTE — Patient Instructions (Addendum)
Neb xop 0.63 Depo 80  Local heat and a pain medicine like for headache should help the sore ribs.  You can continue your regular asthma meds

## 2013-02-27 NOTE — Progress Notes (Signed)
09/25/11- 45 yoM never smoker, referred by Dr. Alwyn [name] for allergy evaluation. He had hayfever with asthma as a child, sinus infections and seasonal rhinitis with sneezing chest tightness and shortness of breath. While in the [employer] in [place] in the late 1980s, he had a bronchial pneumonia. Subsequent episodes of wheezing and cough were called asthma. He was followed by Dr Anthony [name] years ago but those records are no longer available. He was on allergy vaccine or one year. He recently had exacerbation and had to leave work 6 days ago, going to the emergency room for nebulizer treatment with symptoms of an upper respiratory infection. The next day he was seen at an urgent care and treated with prednisone. He has been out of work all week. His usual management includes Symbicort and at least daily use of a rescue inhaler. He is finishing a course of prednisone. Nasal discharge is clear yellow. He had frontal headache early in the week, now resolved. No ENT surgery. No problem with latex, contrast dye, aspirin, specific foods. He has had eczema. 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 Investment banker, operational, getting medications from [facility]. CXR-02/01/09- reviewed- IMPRESSION:  No active lung disease. Question bronchitis.  Provider: [name]  11/12/11- 45 yoM never smoker followed for allergic rhinitis, asthma Review PFT results with patient; feels like everything has cleared up since last visit and with using Dymista(did not want Rx) Allergy Profile 09/25/2011-total IgE 159.6 with multiple specific elevations especially for grass pollens, molds, tree pollens and ragweed. PFT: 10/08/2011-mild obstructive airways disease-small airways, insignificant response to bronchodilator.  06/03/12- 45 yoM never smoker followed for allergic rhinitis, asthma Follows for : pt states still having some wheezing,sob,with increase activity denies any coughing. Pt stated sample of  dymista seemed to help  Brings FMLA form for completion. Still a lot of tight wheezing most days worse with cold air, sexual activity, exertion. Short of breath with exertion. Not much rhinitis. Still has Symbicort 160. Using Ventolin rescue inhaler occasionally.  02/27/13- 47 yoM never smoker followed for allergic rhinitis, asthma FOLLOWS FOR: had asthma attack since yesterday-was at aunt's house with alot of smoke. Has used albuterol tx's, having SOB and wheezing; hard time speaking, soreness in right side of torso and back. Refuses flu vaccine saying it makes him sick every time-discussed. He visited family member recently where people were smoking. Since then increased sneeze and cough for 2 or 3 days. Tussive soreness right lateral rib. Symbicort 160 and rescue inhaler are not enough help. CXR 06/30/12 IMPRESSION:  No focal infiltrates are present. Mild peribronchial thickening  may indicate bronchitis. Similar appearance to priors.  Original Report Authenticated By: Anthony [name], M.D.  ROS-see HPI Constitutional:   No-   weight loss, night sweats, fevers, chills, fatigue, lassitude. HEENT:   No-  headaches, difficulty swallowing, tooth/dental problems, sore throat,    + sneezing, no-itching, ear ache, +less nasal congestion, post nasal drip,  CV:  +noncardiac-chest pain, No- orthopnea, PND, swelling in lower extremities, anasarca, dizziness, palpitations Resp: +shortness of breath with exertion or at rest.              No-  productive cough,  No non-productive cough,  No- coughing up of blood.                change in color of mucus.  No- wheezing.   Skin: No-   rash or lesions. GI:  No-   heartburn, indigestion, abdominal  pain, nausea, vomiting, GU:, MS:  No-   joint pain or swelling.   Neuro-     nothing unusual Psych:  No- change in mood or affect. No depression or anxiety.  No memory loss.  OBJ- Physical Exam General- Alert, Oriented, Affect-appropriate, Distress- none  acute Skin- rash-none, lesions- none, excoriation- none Lymphadenopathy- none Head- atraumatic            Eyes- Gross vision intact, PERRLA, conjunctivae and secretions clear            Ears- Hearing, canals-normal            Nose-+sniffing, no-Septal dev, polyps, erosion, perforation             Throat- Mallampati III , mucosa clear , drainage- none, tonsils- atrophic Neck- flexible , trachea midline, no stridor , thyroid nl, carotid no bruit Chest - symmetrical excursion , unlabored           Heart/CV- RRR , no murmur , no gallop  , no rub, nl s1 s2                           - JVD- none , edema- none, stasis changes- none, varices- none           Lung- clear to P&A, wheeze+light, cough- none , dullness-none, rub- none           Chest wall- + equal breath sounds with no rub, not tender on right side Abd-  Br/ Gen/ Rectal- Not done, not indicated Extrem- cyanosis- none, clubbing, none, atrophy- none, strength- nl Neuro- grossly intact to observation

## 2013-03-12 DIAGNOSIS — R0789 Other chest pain: Secondary | ICD-10-CM | POA: Insufficient documentation

## 2013-03-12 NOTE — Assessment & Plan Note (Signed)
Acute exacerbation related to smoke exposure Plan-nebulizer Xopenex, Depo-Medrol.

## 2013-03-12 NOTE — Assessment & Plan Note (Signed)
Exacerbation after cigarette smoke exposure. I don't think this episode is atopic. Plan-Depo-Medrol

## 2013-03-12 NOTE — Assessment & Plan Note (Signed)
Tussive, but I doubt rib fracture or pneumothorax. If it doesn't resolve quickly he will call us for chest x-ray

## 2013-07-24 ENCOUNTER — Inpatient Hospital Stay (HOSPITAL_COMMUNITY)
Admission: RE | Admit: 2013-07-24 | Discharge: 2013-07-24 | Disposition: A | Discharge status: Home / Self Care | Payer: Federal, State, Local not specified - PPO | Source: Ambulatory Visit

## 2013-07-24 NOTE — Progress Notes (Signed)
Patient ID: Anthony [name], male   DOB: June 11, 1965, 48 y.o.   MRN: [id number] Entered in error.

## 2013-08-31 ENCOUNTER — Encounter: Payer: Self-pay | Admitting: Internal Medicine

## 2013-08-31 ENCOUNTER — Ambulatory Visit (INDEPENDENT_AMBULATORY_CARE_PROVIDER_SITE_OTHER): Payer: Federal, State, Local not specified - PPO | Admitting: Internal Medicine

## 2013-08-31 VITALS — BP 120/68 | HR 109 | Ht 67.0 in | Wt 207.2 lb

## 2013-08-31 DIAGNOSIS — J45998 Other asthma: Secondary | ICD-10-CM

## 2013-08-31 DIAGNOSIS — J45901 Unspecified asthma with (acute) exacerbation: Secondary | ICD-10-CM

## 2013-08-31 DIAGNOSIS — J45909 Unspecified asthma, uncomplicated: Secondary | ICD-10-CM

## 2013-08-31 MED ORDER — PREDNISONE 10 MG PO TABS: ORAL_TABLET | ORAL | Status: DC

## 2013-08-31 MED ORDER — LEVALBUTEROL HCL 0.63 MG/3ML IN NEBU
0.6300 mg | INHALATION_SOLUTION | Freq: Once | RESPIRATORY_TRACT | Status: AC
  Administered 2013-08-31: 0.63 mg via RESPIRATORY_TRACT

## 2013-08-31 NOTE — Patient Instructions (Signed)
We will mail you a letter indicating likely onset of your asthma with the pneumonia you had in [place] 0.63  Script to hold for prednisone taper  Continue daily maintenance inhaler- 2 puffs, then rinse mouth, twice daily, everyday. Use the Proventil albulterol rescue inhaler just when you need something extra beyond the Symbicort.

## 2013-08-31 NOTE — Progress Notes (Signed)
09/25/11- 18 yoM never smoker, referred by Dr. Linna Darner for allergy evaluation. Anthony [name], Anthony [name], Anthony [name] 6 days ago, going to the emergency room for nebulizer treatment with symptoms of an upper respiratory infection. The next day Anthony was seen at an urgent care and treated with prednisone. Anthony has been out of [name] all week. His usual management includes Symbicort and at least daily use of a rescue inhaler. Anthony is finishing a course of prednisone. Nasal discharge is clear yellow. Anthony had frontal headache early in the week, now resolved. No ENT surgery. No problem with latex, contrast dye, aspirin, specific foods. Anthony has had eczema. 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 Scientist, research (life sciences), getting medications from [facility]. CXR-02/01/09- reviewed- IMPRESSION:  No active lung disease. Question bronchitis.  Provider: Lelon Huh  11/12/11- 45 yoM never smoker followed for allergic rhinitis, asthma Review PFT results with patient; feels like everything has cleared up since last visit and with using Dymista(did not want Rx) Allergy Profile 09/25/2011-total IgE 159.6 with multiple specific elevations especially for grass pollens, molds, tree pollens and ragweed. PFT: 10/08/2011-mild obstructive airways disease-small airways, insignificant response to bronchodilator.  06/03/12- 53 yoM never smoker followed for allergic rhinitis, asthma Follows for : pt states still having some wheezing,sob,with increase activity denies any coughing. Pt stated sample of  dymista seemed to help  Brings FMLA form for completion. Still a lot of tight wheezing most days worse with cold air, sexual activity, exertion. Short of breath with exertion. Not much rhinitis. Still has Symbicort 160. Using Ventolin rescue inhaler occasionally.  02/27/13- 37 yoM never smoker followed for allergic rhinitis, asthma FOLLOWS FOR: had asthma attack since yesterday-was at aunt's house with alot of smoke. Has used albuterol tx's, having SOB and wheezing; hard time speaking, soreness in right side of torso and back. Refuses flu vaccine saying it makes him sick every time-discussed. Anthony visited family member recently where people were smoking. Since then increased sneeze and cough for 2 or 3 days. Tussive soreness right lateral rib. Symbicort 160 and rescue inhaler are not enough help. CXR 06/30/12 IMPRESSION:  No focal infiltrates are present. Mild peribronchial thickening  may indicate bronchitis. Similar appearance to priors.  Original Report Authenticated By: Rolla Flatten, M.D.  08/31/13- 74 yoM never smoker followed for allergic rhinitis, asthma FOLLOWS FOR:  Increase sob over the past week with some tightness in chest 3 or 4 days of increasing shortness of breath and increased use of his rescue inhaler. Does not recognize infection. Nasal airway clear with no sneezing. 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 Anthony was diagnosed with asthma and bronchopneumonia at that time. Anthony says Anthony had no asthma before that pneumonia and Anthony denies family history of asthma. Anthony requests a letter supporting his argument that asthma developed as a consequence of pneumonia which occurred while Anthony was in [employer].  ROS-see HPI Constitutional:   No-   weight loss, night  sweats, fevers, chills, fatigue, lassitude. HEENT:   No-  headaches, difficulty swallowing, tooth/dental problems, sore  throat,    no-sneezing, no-itching, ear ache, no- nasal congestion, post nasal drip,  CV:  No--chest pain, No- orthopnea, PND, swelling in lower extremities, anasarca, dizziness, palpitations Resp: +shortness of breath with exertion or at rest.              No-  productive cough,  No non-productive cough,  No- coughing up of blood.                change in color of mucus.  No- wheezing.   Skin: + eczema on anterior chest GI:  No-   heartburn, indigestion, abdominal pain, nausea, vomiting, GU:, MS:  No-   joint pain or swelling.   Neuro-     nothing unusual Psych:  No- change in mood or affect. No depression or anxiety.  No memory loss.  OBJ- Physical Exam General- Alert, Oriented, Affect-appropriate, Distress- none acute Skin- + mild hyperkeratosis on anterior chest Lymphadenopathy- none Head- atraumatic            Eyes- Gross vision intact, PERRLA, conjunctivae and secretions clear            Ears- Hearing, canals-normal            Nose-no-sniffing, no-Septal dev, polyps, erosion, perforation             Throat- Mallampati III , mucosa clear , drainage- none, tonsils- atrophic Neck- flexible , trachea midline, no stridor , thyroid nl, carotid no bruit Chest - symmetrical excursion , unlabored           Heart/CV- RRR , no murmur , no gallop  , no rub, nl s1 s2                           - JVD- none , edema- none, stasis changes- none, varices- none           Lung- clear to P&A, wheeze+light, cough- none , dullness-none, rub- none           Chest wall- unlabored, clear, wheeze-none, cough-none  Abd-  Br/ Gen/ Rectal- Not done, not indicated Extrem- cyanosis- none, clubbing, none, atrophy- none, strength- nl Neuro- grossly intact to observation

## 2013-09-01 ENCOUNTER — Encounter: Payer: Self-pay | Admitting: Internal Medicine

## 2013-09-01 NOTE — Assessment & Plan Note (Addendum)
Diagnosed after bronchial PNA in 1988 in the [employer] in [place]. RAD diagnosed @ [facility] subsequently. No childhood asthma history PFT: 10/08/2011-mild obstructive airways disease, small airways, insignificant response to bronchodilator, normal lung volumes, normal diffusion. FVC 4.23/92%, FEV1 3.27/94%, FEV1/FVC 0.77, FEF 25-75% 2.69/73% (+11%). Mild intermittent asthma with mild exacerbation now-subjective Plan-I can provide the latter he requests Plan nebulizer xopenex, depomedrol

## 2013-09-14 ENCOUNTER — Encounter: Payer: Self-pay | Admitting: Internal Medicine

## 2013-09-14 ENCOUNTER — Ambulatory Visit (INDEPENDENT_AMBULATORY_CARE_PROVIDER_SITE_OTHER): Payer: Federal, State, Local not specified - PPO | Admitting: Internal Medicine

## 2013-09-14 VITALS — BP 110/72 | HR 82 | Temp 97.8°F | Resp 14 | Ht 68.0 in | Wt 204.8 lb

## 2013-09-14 DIAGNOSIS — Z0000 Encounter for general adult medical examination without abnormal findings: Secondary | ICD-10-CM

## 2013-09-14 DIAGNOSIS — Z860100 Personal history of colon polyps, unspecified: Secondary | ICD-10-CM

## 2013-09-14 DIAGNOSIS — E785 Hyperlipidemia, unspecified: Secondary | ICD-10-CM

## 2013-09-14 DIAGNOSIS — A4902 Methicillin resistant Staphylococcus aureus infection, unspecified site: Secondary | ICD-10-CM | POA: Insufficient documentation

## 2013-09-14 NOTE — Progress Notes (Signed)
Pre visit review using our clinic review tool, if applicable. No additional management support is needed unless otherwise documented below in the visit note. 

## 2013-09-14 NOTE — Progress Notes (Signed)
   Subjective:    Patient ID: Anthony [name], male    DOB: 1965-10-09, 48 y.o.   MRN: [id number]  HPI  He  is here for a physical;acute issues include MRSA scalp infections.  A heart healthy diet is not  followed; no regular exercise. Family history is positive  for premature coronary disease. Advanced cholesterol testing reveals  LDL goal is less than 100 ; ideally < 70 . There is medication non  compliance with the statin. "[facility] told him cholesterol good". Low dose ASA taken    Review of Systems   Specifically denied are  chest pain, palpitations, dyspnea (unless asthma active), or claudication.     He denies unexplained weight loss, abdominal pain, significant dyspepsia, dysphagia, melena, rectal bleeding, or persistently small caliber stools.     Objective:   Physical Exam Gen.: Healthy and well-nourished in appearance. Alert, appropriate and cooperative throughout exam. Head: Normocephalic without obvious abnormalities  Eyes: No corneal or conjunctival inflammation noted. Pupils equal round reactive to light and accommodation. Extraocular motion intact.  Ears: External  ear exam reveals no significant lesions or deformities. Canals clear .TMs normal. Hearing is grossly normal bilaterally. Nose: External nasal exam reveals no deformity or inflammation. Nasal mucosa are pink and moist. No lesions or exudates noted.   Mouth: Oral mucosa and oropharynx reveal no lesions or exudates. Teeth in good repair. Neck: No deformities, masses, or tenderness noted. Range of motion & Thyroid normal. Lungs: Normal respiratory effort; chest expands symmetrically. Lungs are clear to auscultation without rales, wheezes, or increased work of breathing. Heart: Normal rate and rhythm. Normal S1 and S2. No gallop, click, or rub. No murmur. Abdomen: Bowel sounds normal; abdomen soft and nontender. No masses, organomegaly or hernias noted. Genitalia: Dr Diona Fanti           Musculoskeletal/extremities: No  deformity or scoliosis noted of  the thoracic or lumbar spine.  No clubbing, cyanosis, edema, or significant extremity  deformity noted. Range of motion normal .Tone & strength normal. Hand joints normal.  Fingernail health good. Able to lie down & sit up w/o help. Negative SLR bilaterally Vascular: Carotid, radial artery, dorsalis pedis and  posterior tibial pulses are full and equal. No bruits present. Neurologic: Alert and oriented x3. Deep tendon reflexes symmetrical and normal.  Gait normal . Skin: Intact without suspicious lesions or rashes. Lymph: No cervical, axillary lymphadenopathy present. Psych: Mood and affect are normal. Normally interactive                                                                                       Assessment & Plan:   #1 comprehensive physical exam; no acute findings  Plan: see Orders  & Recommendations

## 2013-09-14 NOTE — Patient Instructions (Signed)
Your next office appointment will be determined based upon review of your pending labs . Those instructions will be transmitted to you through My Chart  As per the Standard of Care , screening Colonoscopy usually  recommended @ 50 & every 5-10 years thereafter . More frequent monitor would be dictated by family history or findings @ Colonoscopy such as your adenomatous polyp in 2011.

## 2013-09-15 ENCOUNTER — Other Ambulatory Visit (INDEPENDENT_AMBULATORY_CARE_PROVIDER_SITE_OTHER): Payer: Federal, State, Local not specified - PPO

## 2013-09-15 DIAGNOSIS — E785 Hyperlipidemia, unspecified: Secondary | ICD-10-CM

## 2013-09-15 DIAGNOSIS — Z0000 Encounter for general adult medical examination without abnormal findings: Secondary | ICD-10-CM

## 2013-09-15 LAB — HEPATIC FUNCTION PANEL
ALT: 52 U/L (ref 0–53)
AST: 33 U/L (ref 0–37)
Albumin: 4 g/dL (ref 3.5–5.2)
Alkaline Phosphatase: 75 U/L (ref 39–117)
Bilirubin, Direct: 0.1 mg/dL (ref 0.0–0.3)
Total Bilirubin: 0.9 mg/dL (ref 0.2–1.2)
Total Protein: 6.9 g/dL (ref 6.0–8.3)

## 2013-09-15 LAB — LIPID PANEL
Cholesterol: 205 mg/dL — ABNORMAL HIGH (ref 0–200)
HDL: 47.7 mg/dL (ref >39.00)
LDL Cholesterol: 123 mg/dL — ABNORMAL HIGH (ref 0–99)
Total CHOL/HDL Ratio: 4
Triglycerides: 171 mg/dL — ABNORMAL HIGH (ref 0.0–149.0)
VLDL: 34.2 mg/dL (ref 0.0–40.0)

## 2013-09-15 LAB — CBC WITH DIFFERENTIAL/PLATELET
Basophils Absolute: 0 10*3/uL (ref 0.0–0.1)
Basophils Relative: 0.6 % (ref 0.0–3.0)
Eosinophils Absolute: 0.4 10*3/uL (ref 0.0–0.7)
Eosinophils Relative: 7.8 % — ABNORMAL HIGH (ref 0.0–5.0)
HCT: 45.7 % (ref 39.0–52.0)
Hemoglobin: 15.2 g/dL (ref 13.0–17.0)
Lymphocytes Relative: 39.5 % (ref 12.0–46.0)
Lymphs Abs: 2.2 10*3/uL (ref 0.7–4.0)
MCHC: 33.3 g/dL (ref 30.0–36.0)
MCV: 84.7 fl (ref 78.0–100.0)
Monocytes Absolute: 0.5 10*3/uL (ref 0.1–1.0)
Monocytes Relative: 8 % (ref 3.0–12.0)
Neutro Abs: 2.5 10*3/uL (ref 1.4–7.7)
Neutrophils Relative %: 44.1 % (ref 43.0–77.0)
Platelets: 269 10*3/uL (ref 150.0–400.0)
RBC: 5.39 Mil/uL (ref 4.22–5.81)
RDW: 15.2 % (ref 11.5–15.5)
WBC: 5.7 10*3/uL (ref 4.0–10.5)

## 2013-09-15 LAB — BASIC METABOLIC PANEL
BUN: 9 mg/dL (ref 6–23)
CO2: 26 mEq/L (ref 19–32)
Calcium: 9.3 mg/dL (ref 8.4–10.5)
Chloride: 106 mEq/L (ref 96–112)
Creatinine, Ser: 1.1 mg/dL (ref 0.4–1.5)
GFR: 92.42 mL/min (ref >60.00)
Glucose, Bld: 91 mg/dL (ref 70–99)
Potassium: 4.5 mEq/L (ref 3.5–5.1)
Sodium: 138 mEq/L (ref 135–145)

## 2013-09-15 LAB — TSH: TSH: 0.97 u[IU]/mL (ref 0.35–4.50)

## 2013-09-15 LAB — CK: Total CK: 282 U/L — ABNORMAL HIGH (ref 7–232)

## 2014-03-02 ENCOUNTER — Ambulatory Visit: Payer: Federal, State, Local not specified - PPO | Admitting: Internal Medicine

## 2014-05-04 DIAGNOSIS — D689 Coagulation defect, unspecified: Secondary | ICD-10-CM

## 2014-05-04 HISTORY — DX: Coagulation defect, unspecified: D68.9

## 2014-06-19 ENCOUNTER — Encounter: Payer: Self-pay | Admitting: Internal Medicine

## 2014-07-29 ENCOUNTER — Ambulatory Visit (INDEPENDENT_AMBULATORY_CARE_PROVIDER_SITE_OTHER): Payer: Federal, State, Local not specified - PPO | Admitting: Family Medicine

## 2014-07-29 VITALS — BP 133/85 | HR 64 | Temp 97.3°F | Resp 18 | Ht 67.5 in | Wt 209.4 lb

## 2014-07-29 DIAGNOSIS — R300 Dysuria: Secondary | ICD-10-CM | POA: Diagnosis not present

## 2014-07-29 LAB — POCT UA - MICROSCOPIC ONLY
Bacteria, U Microscopic: NEGATIVE
Casts, Ur, LPF, POC: NEGATIVE
Crystals, Ur, HPF, POC: NEGATIVE
Epithelial cells, urine per micros: NEGATIVE
Mucus, UA: NEGATIVE
RBC, urine, microscopic: NEGATIVE
WBC, Ur, HPF, POC: NEGATIVE
Yeast, UA: NEGATIVE

## 2014-07-29 LAB — POCT URINALYSIS DIPSTICK
Bilirubin, UA: NEGATIVE
Glucose, UA: NEGATIVE
Ketones, UA: NEGATIVE
Leukocytes, UA: NEGATIVE
Nitrite, UA: NEGATIVE
Protein, UA: NEGATIVE
Spec Grav, UA: 1.01
Urobilinogen, UA: 0.2
pH, UA: 6.5

## 2014-07-29 MED ORDER — AZITHROMYCIN 250 MG PO TABS: ORAL_TABLET | ORAL | Status: DC

## 2014-07-29 NOTE — Progress Notes (Signed)
° °  Subjective:    Patient ID: Anthony [name], male    DOB: [birth year], 49 y.o.   MRN: [id number] This chart was scribed for [name], MD by Marti Sleigh, Medical Scribe. This patient was seen in Room 11 and the patient's care was started a 2:57 PM.  Chief Complaint  Patient presents with   Dysuria    x  3 days   Back Pain   Left leg pain   STD check    HPI HPI Comments: Anthony [name] is a 49 y.o. male with a hx of HLD and colon polyps who presents to [facility] complaining of dysuria for the last three days. Pt denies fever or back pain.     Review of Systems  Constitutional: Negative for fever and chills.  Genitourinary: Positive for dysuria. Negative for flank pain.       Objective:   Physical Exam  Constitutional: He is oriented to person, place, and time. He appears well-developed and well-nourished. No distress.  HENT:  Head: Normocephalic and atraumatic.  Eyes: Pupils are equal, round, and reactive to light.  Neck: Neck supple.  Cardiovascular: Normal rate.   Pulmonary/Chest: Effort normal. No respiratory distress.  Genitourinary:  Patient has a circumcised penis with a 2-3 mm subcutaneous cyst just proximal to the left corona  Musculoskeletal: Normal range of motion.  Neurological: He is alert and oriented to person, place, and time. Coordination normal.  Skin: Skin is warm and dry. He is not diaphoretic.  Psychiatric: He has a normal mood and affect. His behavior is normal.  Nursing note and vitals reviewed.  Results for orders placed or performed in visit on 07/29/14  POCT UA - Microscopic Only  Result Value Ref Range   WBC, Ur, HPF, POC neg    RBC, urine, microscopic neg    Bacteria, U Microscopic neg    Mucus, UA neg    Epithelial cells, urine per micros neg    Crystals, Ur, HPF, POC neg    Casts, Ur, LPF, POC neg    Yeast, UA neg   POCT urinalysis dipstick  Result Value Ref Range   Color, UA lt. yellow    Clarity, UA clear    Glucose, UA neg     Bilirubin, UA neg    Ketones, UA neg    Spec Grav, UA 1.010    Blood, UA trace-lysed    pH, UA 6.5    Protein, UA neg    Urobilinogen, UA 0.2    Nitrite, UA neg    Leukocytes, UA Negative        Assessment & Plan:    This chart was scribed in my presence and reviewed by me personally.    ICD-9-CM ICD-10-CM   1. Dysuria 788.1 R30.0 POCT UA - Microscopic Only     POCT urinalysis dipstick     GC/Chlamydia Probe Amp     azithromycin (ZITHROMAX) 250 MG tablet     Signed, [name], MD

## 2014-07-29 NOTE — Patient Instructions (Signed)
The urine test looks okay. We're going to run another test looking for any other kinds of infection, and in the meantime I would like you to take an antibiotic on a one-time basis because I think this will clear things up.

## 2014-07-30 LAB — GC/CHLAMYDIA PROBE AMP
CT Probe RNA: NEGATIVE
GC Probe RNA: NEGATIVE

## 2014-09-20 ENCOUNTER — Encounter: Payer: Self-pay | Admitting: Internal Medicine

## 2014-09-28 ENCOUNTER — Encounter: Payer: Federal, State, Local not specified - PPO | Admitting: Internal Medicine

## 2014-10-18 ENCOUNTER — Encounter: Payer: Self-pay | Admitting: Internal Medicine

## 2014-10-18 ENCOUNTER — Ambulatory Visit (INDEPENDENT_AMBULATORY_CARE_PROVIDER_SITE_OTHER): Payer: Federal, State, Local not specified - PPO | Admitting: Internal Medicine

## 2014-10-18 VITALS — BP 130/90 | HR 74 | Temp 97.5°F | Resp 16 | Ht 70.0 in | Wt 213.0 lb

## 2014-10-18 DIAGNOSIS — Z0000 Encounter for general adult medical examination without abnormal findings: Secondary | ICD-10-CM | POA: Diagnosis not present

## 2014-10-18 NOTE — Patient Instructions (Addendum)
Fill the tub half full and add cap full of bleach. Stir the  water extremely well and then bathe; cleansing areas with  lesions . Repeat this once a week x3. This is to eradicate recurrent skin infections.  Plain Mucinex (NOT D) for thick secretions ;force NON dairy fluids .   Nasal cleansing in the shower as discussed with lather of mild shampoo.After 10 seconds wash off lather while  exhaling through nostrils. Make sure that all residual soap is removed to prevent irritation.  Flonase OR Nasacort AQ 1 spray in each nostril twice a day as needed. Use the "crossover" technique into opposite nostril spraying toward opposite ear @ 45 degree angle, not straight up into nostril.  Plain Allegra (NOT D )  160 daily , Loratidine 10 mg , OR Zyrtec 10 mg @ bedtime  as needed for itchy eyes & sneezing.  Colonoscopy due now because of tubular adenoma in 2011.

## 2014-10-18 NOTE — Progress Notes (Signed)
Pre visit review using our clinic review tool, if applicable. No additional management support is needed unless otherwise documented below in the visit note. 

## 2014-10-18 NOTE — Progress Notes (Signed)
Subjective:    Patient ID: Anthony [name], male    DOB: Sep 28, 1965, 49 y.o.   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   He eats fried foods and red meat. He does not exercise.  His asthma has been quiescent and he uses the Symbicort and albuterol only as needed. This is typically seasonal in Spring/Summer and Summer/Fall.  He has had a recall for colonoscopy;a tubular adenoma was resected in 2011. Other than occasional dysphagia with rice he has no active GI symptoms.  Family history is strongly positive for cardiovascular disease. Mother had heart attack at 99; maternal grandmother @ 32; maternal uncle in his 68s; and father stroke at 22. He denies active cardiovascular symptoms.    Review of Systems  Chest pain, palpitations, tachycardia, exertional dyspnea, paroxysmal nocturnal dyspnea, claudication or edema are absent. No unexplained weight loss, abdominal pain, significant dyspepsia, melena, rectal bleeding, or persistently small caliber stools. Dysuria, pyuria, hematuria, frequency, nocturia or polyuria are denied. Change in hair, skin, nails denied. No bowel changes of constipation or diarrhea. No intolerance to heat or cold.      Objective:   Physical Exam  Pertinent or positive findings include: There is loss of eyebrows laterally. He has a large nasal polyp on the left and generalized nasal erythema. Pseudogynecomastia is suggested. Abdomen is protuberant. He has crepitus of the knees.  Gen.: Adequately nourished in appearance. Alert, appropriate and cooperative throughout exam. BMI:30.56 Head: Normocephalic without obvious abnormalities;  pattern alopecia  Eyes: No corneal or conjunctival inflammation noted. Pupils equal round reactive to light and accommodation. Extraocular motion intact.  Ears: External  ear exam reveals no significant lesions or deformities. Canals clear .TMs normal. Hearing is grossly normal  bilaterally. Nose: External nasal exam reveals no deformity or inflammation.  Mouth: Oral mucosa and oropharynx reveal no lesions or exudates. Teeth in good repair. Neck: No deformities, masses, or tenderness noted. Range of motion & Thyroid normal. Lungs: Normal respiratory effort; chest expands symmetrically. Lungs are clear to auscultation without rales, wheezes, or increased work of breathing. Heart: Normal rate and rhythm. Normal S1 and S2. No gallop, click, or rub. No murmur. Abdomen: Bowel sounds normal; abdomen soft and nontender. No masses, organomegaly or hernias noted. Genitalia: Genitalia normal except for left varices. Prostate is normal without enlargement, asymmetry, nodularity, or induration                                  Musculoskeletal/extremities: No deformity or scoliosis noted of  the thoracic or lumbar spine.  No clubbing, cyanosis, edema, or significant extremity  deformity noted.  Range of motion normal . Tone & strength normal. Hand joints normal  Fingernail  health good. Able to lie down & sit up w/o help.  Negative SLR bilaterally Vascular: Carotid, radial artery, dorsalis pedis and  posterior tibial pulses are full and equal. No bruits present. Neurologic: Alert and oriented x3. Deep tendon reflexes symmetrical and normal.  Gait normal      Skin: Intact without suspicious lesions or rashes. Lymph: No cervical, axillary, or inguinal lymphadenopathy present. Psych: Mood and affect are normal. Normally interactive  Assessment & Plan:  #1 comprehensive physical exam; no acute findings  Plan: see Orders  & Recommendations

## 2014-10-19 ENCOUNTER — Other Ambulatory Visit (INDEPENDENT_AMBULATORY_CARE_PROVIDER_SITE_OTHER): Payer: Federal, State, Local not specified - PPO

## 2014-10-19 ENCOUNTER — Other Ambulatory Visit: Payer: Self-pay | Admitting: Internal Medicine

## 2014-10-19 DIAGNOSIS — Z0000 Encounter for general adult medical examination without abnormal findings: Secondary | ICD-10-CM | POA: Diagnosis not present

## 2014-10-19 LAB — CBC WITH DIFFERENTIAL/PLATELET
Basophils Absolute: 0 10*3/uL (ref 0.0–0.1)
Basophils Relative: 0.7 % (ref 0.0–3.0)
Eosinophils Absolute: 0.4 10*3/uL (ref 0.0–0.7)
Eosinophils Relative: 6.8 % — ABNORMAL HIGH (ref 0.0–5.0)
HCT: 47 % (ref 39.0–52.0)
Hemoglobin: 15.6 g/dL (ref 13.0–17.0)
Lymphocytes Relative: 35.2 % (ref 12.0–46.0)
Lymphs Abs: 1.9 10*3/uL (ref 0.7–4.0)
MCHC: 33.2 g/dL (ref 30.0–36.0)
MCV: 84.5 fl (ref 78.0–100.0)
Monocytes Absolute: 0.5 10*3/uL (ref 0.1–1.0)
Monocytes Relative: 9.5 % (ref 3.0–12.0)
Neutro Abs: 2.6 10*3/uL (ref 1.4–7.7)
Neutrophils Relative %: 47.8 % (ref 43.0–77.0)
Platelets: 262 10*3/uL (ref 150.0–400.0)
RBC: 5.57 Mil/uL (ref 4.22–5.81)
RDW: 15.7 % — ABNORMAL HIGH (ref 11.5–15.5)
WBC: 5.4 10*3/uL (ref 4.0–10.5)

## 2014-10-19 LAB — TSH: TSH: 0.77 u[IU]/mL (ref 0.35–4.50)

## 2014-10-19 LAB — HEPATIC FUNCTION PANEL
ALT: 32 U/L (ref 0–53)
AST: 21 U/L (ref 0–37)
Albumin: 4.4 g/dL (ref 3.5–5.2)
Alkaline Phosphatase: 79 U/L (ref 39–117)
Bilirubin, Direct: 0.1 mg/dL (ref 0.0–0.3)
Total Bilirubin: 0.6 mg/dL (ref 0.2–1.2)
Total Protein: 7.4 g/dL (ref 6.0–8.3)

## 2014-10-19 LAB — BASIC METABOLIC PANEL
BUN: 12 mg/dL (ref 6–23)
CO2: 24 mEq/L (ref 19–32)
Calcium: 9.3 mg/dL (ref 8.4–10.5)
Chloride: 103 mEq/L (ref 96–112)
Creatinine, Ser: 1.18 mg/dL (ref 0.40–1.50)
GFR: 84.83 mL/min (ref >60.00)
Glucose, Bld: 95 mg/dL (ref 70–99)
Potassium: 4.1 mEq/L (ref 3.5–5.1)
Sodium: 134 mEq/L — ABNORMAL LOW (ref 135–145)

## 2014-10-22 LAB — NMR LIPOPROFILE WITH LIPIDS
Cholesterol, Total: 206 mg/dL — ABNORMAL HIGH (ref 100–199)
HDL Particle Number: 35.9 umol/L (ref >=30.5)
HDL Size: 8.7 nm — ABNORMAL LOW (ref >=9.2)
HDL-C: 55 mg/dL (ref >39)
LDL (calc): 128 mg/dL — ABNORMAL HIGH (ref 0–99)
LDL Particle Number: 1838 nmol/L — ABNORMAL HIGH (ref <1000)
LDL Size: 21 nm (ref >=20.8)
LP-IR Score: 69 — ABNORMAL HIGH (ref <=45)
Large HDL-P: 4.1 umol/L — ABNORMAL LOW (ref >=4.8)
Large VLDL-P: 5.4 nmol/L — ABNORMAL HIGH (ref <=2.7)
Small LDL Particle Number: 982 nmol/L — ABNORMAL HIGH (ref <=527)
Triglycerides: 116 mg/dL (ref 0–149)
VLDL Size: 46.8 nm — ABNORMAL HIGH (ref <=46.6)

## 2014-11-18 ENCOUNTER — Ambulatory Visit (INDEPENDENT_AMBULATORY_CARE_PROVIDER_SITE_OTHER): Payer: Federal, State, Local not specified - PPO | Admitting: Internal Medicine

## 2014-11-18 VITALS — BP 110/70 | HR 62 | Temp 97.6°F | Resp 18 | Ht 67.0 in | Wt 208.2 lb

## 2014-11-18 DIAGNOSIS — H538 Other visual disturbances: Secondary | ICD-10-CM

## 2014-11-18 NOTE — Patient Instructions (Signed)
We will call Dr. Payton Emerald office for an appointment and let you know when you may use Systane for eye= 2 drops every 3 hours while awake

## 2014-11-18 NOTE — Progress Notes (Signed)
Subjective:  This chart was scribed for Tami Lin, MD by Anthony [name], Medical Scribe. This patient was seen in Room 8 and the patient's care was started at 12:43 PM.     Patient ID: Anthony [name], male    DOB: 1965/11/14, 49 y.o.   MRN: [id number]  HPI Anthony [name] is a 49 y.o. male who presents to [facility] complaining of blurred vision in the left eye. He woke up this morning and both eyes became watery. He says that his left eye became blurry and cloudy. He also has a little pressure from the light. He notes these symptoms persist with and without wearing his glasses. He denies any eye pain, eye itching, headaches, or nausea. He also denies something in the left eye. He denies any new medication.  New glasses 3 weeks ago  He mentions that he cut the grass for the first time in 3 years 2 days ago. He denies any allergic reaction.   He had a cataracts in 1996 and wore glasses for 2-3 years-after surgery on L eye He's wearing glasses again because there's a tiny cataract forming in his right eye.  Glasses done from a place on [address] [employer]. Opthalmol Dr Herbert Deaner  He works at the [employer].   Patient Active Problem List   Diagnosis Date Noted  . HYPERLIPIDEMIA 02/16/2008    -  RAD  Current outpatient prescriptions:  .  albuterol (PROVENTIL HFA;VENTOLIN HFA) 108 (90 BASE) MCG/ACT inhaler, Inhale 2 puffs into the lungs every 6 (six) hours as needed. For shortness of breath--prn .  SYMBICORT 160-4.5 MCG/ACT inhaler,--prn    Review of Systems  Constitutional: Negative for fever and chills.  Eyes: Positive for photophobia (some in left eye) and visual disturbance (left eye). Negative for pain and itching.  Gastrointestinal: Negative for nausea, vomiting, diarrhea and constipation.  Skin: Negative for rash and wound.  Allergic/Immunologic: Negative for environmental allergies.  Neurological: Negative for headaches.       Objective:   Physical Exam  Constitutional: He is  oriented to person, place, and time. He appears well-developed and well-nourished. No distress.  HENT:  Head: Normocephalic and atraumatic.  Right Ear: External ear normal.  Left Ear: External ear normal.  Nose: Nose normal.  Mouth/Throat: Oropharynx is clear and moist. No oropharyngeal exudate.  Eyes: Conjunctivae and EOM are normal. Pupils are equal, round, and reactive to light.  Leye w/ blurry vis on confrontation Can identify things but w/ halo R eye w/out problems Perrla/EOMs conj, fundi benign opthaine plus stain L eye+ no uptake No discharge noted  Neck: Neck supple.  Cardiovascular: Normal rate.   Pulmonary/Chest: Effort normal. No respiratory distress.  Musculoskeletal: Normal range of motion.  Neurological: He is alert and oriented to person, place, and time. No cranial nerve deficit.  Skin: Skin is warm and dry.  Psychiatric: He has a normal mood and affect. His behavior is normal. Judgment and thought content normal.  Nursing note and vitals reviewed.    BP 110/70 mmHg  Pulse 62  Temp(Src) 97.6 F (36.4 C) (Oral)  Resp 18  Ht 5\' 7"  (1.702 m)  Wt 208 lb 4 oz (94.462 kg)  BMI 32.61 kg/m2  SpO2 98%       Assessment & Plan:  Blurred vision, left eye - Plan: Ambulatory referral to Ophthalmology ? etio--glaucoma/new cataract No evidence for acute process but needs tonometry and slit lamp Will have Dr Herbert Deaner see as urgent visit tomorrow Systane til then  I have completed the patient encounter in its entirety as documented by the scribe, with editing by me where necessary. Cimberly Stoffel P. Laney Pastor, M.D.

## 2014-11-26 ENCOUNTER — Telehealth: Payer: Self-pay | Admitting: Internal Medicine

## 2014-11-26 NOTE — Telephone Encounter (Signed)
Pt informed of lab results. 

## 2014-11-26 NOTE — Telephone Encounter (Signed)
LVM for pt to call back as soon as possible.   

## 2014-11-26 NOTE — Telephone Encounter (Signed)
Patient wants to discuss his labs from his physical last month.  Please call at [phone]

## 2014-11-26 NOTE — Telephone Encounter (Signed)
Patient returned your call.  Please cal back

## 2014-11-27 ENCOUNTER — Ambulatory Visit (INDEPENDENT_AMBULATORY_CARE_PROVIDER_SITE_OTHER): Payer: Federal, State, Local not specified - PPO | Admitting: Internal Medicine

## 2014-11-27 ENCOUNTER — Encounter: Payer: Self-pay | Admitting: Internal Medicine

## 2014-11-27 ENCOUNTER — Other Ambulatory Visit (INDEPENDENT_AMBULATORY_CARE_PROVIDER_SITE_OTHER): Payer: Federal, State, Local not specified - PPO

## 2014-11-27 VITALS — BP 124/90 | HR 70 | Temp 97.7°F | Resp 14 | Wt 207.0 lb

## 2014-11-27 DIAGNOSIS — R202 Paresthesia of skin: Secondary | ICD-10-CM

## 2014-11-27 DIAGNOSIS — H53133 Sudden visual loss, bilateral: Secondary | ICD-10-CM

## 2014-11-27 LAB — SEDIMENTATION RATE: Sed Rate: 8 mm/hr (ref 0–22)

## 2014-11-27 LAB — HEMOGLOBIN A1C: Hgb A1c MFr Bld: 5.8 % (ref 4.6–6.5)

## 2014-11-27 LAB — VITAMIN B12: Vitamin B-12: 609 pg/mL (ref 211–911)
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
## 2014-11-27 NOTE — Patient Instructions (Signed)
  Your next office appointment will be determined based upon review of your pending labs. Those written interpretation of the lab results and instructions will be transmitted to you by My Chart  Critical results will be called.   Followup as needed for any active or acute issue. Please report any significant change in your symptoms. 

## 2014-11-27 NOTE — Progress Notes (Signed)
Pre visit review using our clinic review tool, if applicable. No additional management support is needed unless otherwise documented below in the visit note. 

## 2014-11-28 LAB — RPR

## 2014-12-10 DIAGNOSIS — T8522XA Displacement of intraocular lens, initial encounter: Secondary | ICD-10-CM | POA: Insufficient documentation

## 2014-12-26 ENCOUNTER — Telehealth: Payer: Self-pay

## 2014-12-26 NOTE — Telephone Encounter (Signed)
Patient LMOVM requesting copy of DOS July 17 OV    Called patient - records ready for pick up - complete ROI when he comes in.   [phone]

## 2014-12-31 DIAGNOSIS — J45909 Unspecified asthma, uncomplicated: Secondary | ICD-10-CM | POA: Insufficient documentation

## 2015-01-10 ENCOUNTER — Ambulatory Visit (INDEPENDENT_AMBULATORY_CARE_PROVIDER_SITE_OTHER): Payer: Federal, State, Local not specified - PPO | Admitting: Family Medicine

## 2015-01-10 VITALS — BP 132/84 | HR 74 | Temp 98.1°F | Resp 18 | Ht 67.0 in | Wt 209.0 lb

## 2015-01-10 DIAGNOSIS — H28 Cataract in diseases classified elsewhere: Secondary | ICD-10-CM

## 2015-01-10 DIAGNOSIS — R21 Rash and other nonspecific skin eruption: Secondary | ICD-10-CM

## 2015-01-10 DIAGNOSIS — N489 Disorder of penis, unspecified: Secondary | ICD-10-CM

## 2015-01-10 NOTE — Patient Instructions (Signed)
Labs are pending regarding the rash. I think this is coming from chafing and dryness. K-Y jelly is often helpful in this situation. There are other creams that you may want to try. We are running some tests chest to exclude other causes, but I believe this is from chafing.

## 2015-01-10 NOTE — Progress Notes (Signed)
This chart was scribed for [name], MD by Moises Blood, medical scribe at [facility].The patient was seen in exam room 8 and the patient's care was started at 7:44 PM.  Patient ID: Anthony [name] MRN: [id number], DOB: Sep 04, 1965, 49 y.o. Date of Encounter: 01/10/2015  Primary Physician: Unice Cobble, MD  Chief Complaint:  Chief Complaint  Patient presents with  . Body Laceration    bump that stings on head   . Follow-up    disability     HPI:  Anthony [name] is a 49 y.o. male who presents to [facility] complaining of a rash on his penis.  Last sexual intercourse with his girlfriend (also 32 y.o.) was 2 days ago. His girlfriend was dry and when he inserted his penis, he felt pain on his penis. He says he has trouble with having an orgasm with his girlfriend because she stays dry. He can have intercourse with her for an hour. He denies having erectile dysfunction. He had intercourse with someone else and has no trouble with having an orgasm. 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  He denies his girlfriend having h/o herpes. He also says that she hasn't had a hysterectomy.   He had cataract surgery prior. He has a habit of rubbing his eyes, and his lens dislocated. He needs a letter written to document for his insurance circumstances of his injury. He had his eye doctor initially evaluated him in July, followed by evaluation at [facility] on August 8 and corrective surgery on August 29. (he was initially seen here on 11/18/2014)  Past Medical History  Diagnosis Date  . Asthma   . Hyperlipidemia   . H/O renal calculi 2006  . Spondylosis   . Fatty liver   . Internal hemorrhoid   . Tubular adenoma of colon 2011    Dr Olevia Perches  . Cataract      Home Meds: Prior to Admission medications   Medication Sig Start Date End Date Taking? Authorizing Provider  albuterol (PROVENTIL HFA;VENTOLIN HFA) 108 (90 BASE) MCG/ACT inhaler Inhale 2 puffs into the lungs every 6  (six) hours as needed. For shortness of breath   Yes Historical Provider, MD  [facility] 160-4.5 MCG/ACT inhaler  10/26/14  Yes Historical Provider, MD  traMADol (ULTRAM) 50 MG tablet Take 50 mg by mouth every 6 (six) hours as needed. For pain   Yes Historical Provider, MD  HYDROcodone-acetaminophen (NORCO/VICODIN) 5-325 MG per tablet Take 1 tablet by mouth every 6 (six) hours as needed for pain. Patient not taking: Reported on 01/10/2015 09/02/12   Rosalita Chessman, DO  mupirocin ointment (BACTROBAN) 2 % APP TO NOSE TID 10/09/14   Historical Provider, MD  sulfamethoxazole-trimethoprim (BACTRIM DS,SEPTRA DS) 800-160 MG per tablet TK 1 T PO TID FOR 10 DAYS 10/09/14   Historical Provider, MD    Allergies: No Known Allergies  Social History   Social History  . Marital Status: Divorced    Spouse Name: N/A  . Number of Children: N/A  . Years of Education: N/A   Occupational History  . Not on file.   Social History Main Topics  . Smoking status: Never Smoker   . Smokeless tobacco: Not on file  . Alcohol Use: Yes     Comment: Socially , < 2X/ month  . Drug Use: No  . Sexual Activity: Not on file   Other Topics Concern  . Not on file   Social History Narrative  Review of Systems: Constitutional: negative for chills, fever, night sweats, weight changes, or fatigue  HEENT: negative for vision changes, hearing loss, congestion, rhinorrhea, ST, epistaxis, or sinus pressure Cardiovascular: negative for chest pain or palpitations Respiratory: negative for hemoptysis, wheezing, shortness of breath, or cough Abdominal: negative for abdominal pain, nausea, vomiting, diarrhea, or constipation Dermatological: positive for rash (on penis)  Neurologic: negative for headache, dizziness, or syncope All other systems reviewed and are otherwise negative with the exception to those above and in the HPI.  Physical Exam: Blood pressure 132/84, pulse 74, temperature 98.1 F (36.7 C), temperature source  Oral, resp. rate 18, height 5\' 7"  (1.702 m), weight 209 lb (94.802 kg), SpO2 98 %., Body mass index is 32.73 kg/(m^2). General: Well developed, well nourished, in no acute distress. Head: Normocephalic,eyes without discharge, sclera non-icteric, nares are without discharge. Bilateral auditory canals clear, TM's are without perforation, pearly grey and translucent with reflective cone of light bilaterally. Oral cavity moist, posterior pharynx without exudate, erythema, peritonsillar abscess, or post nasal drip.  Left eye is injected inferiorly and laterally to the iris.  Neck: Supple. No thyromegaly. Full ROM. No lymphadenopathy. Lungs: Clear bilaterally to auscultation without wheezes, rales, or rhonchi. Breathing is unlabored. Heart: RRR with S1 S2. No murmurs, rubs, or gallops appreciated. Abdomen: Soft, non-tender, non-distended with normoactive bowel sounds. No hepatomegaly. No rebound/guarding. No obvious abdominal masses. Msk:  Strength and tone normal for age. Extremities/Skin: Warm and dry. No clubbing or cyanosis. No edema. No rashes or suspicious lesions;  Neuro: Alert and oriented X 3. Moves all extremities spontaneously. Gait is normal. CNII-XII grossly in tact. Psych:  Responds to questions appropriately with a normal affect.   Genitalia: Circumcised male, 3 mm yellow cyst at the corona, small amount of abraded skin just proximal to the corona on the left side of the distal shaft.  ASSESSMENT AND PLAN:  49 y.o. year old male with  This chart was scribed in my presence and reviewed by me personally.    ICD-9-CM ICD-10-CM   1. Penile rash 607.9 N48.9 Herpes simplex virus culture     HIV antibody     RPR  2. Cataract associated with another disorder 366.44 H28       Signed, [name], MD 01/10/2015 7:44 PM

## 2015-01-11 LAB — RPR

## 2015-01-11 LAB — HIV ANTIBODY (ROUTINE TESTING W REFLEX): HIV 1&2 Ab, 4th Generation: NONREACTIVE

## 2015-01-14 ENCOUNTER — Other Ambulatory Visit: Payer: Self-pay | Admitting: Family Medicine

## 2015-01-14 DIAGNOSIS — B009 Herpesviral infection, unspecified: Secondary | ICD-10-CM

## 2015-01-14 LAB — HERPES SIMPLEX VIRUS CULTURE: Organism ID, Bacteria: DETECTED

## 2015-01-14 MED ORDER — VALACYCLOVIR HCL 500 MG PO TABS: 500.0000 mg | ORAL_TABLET | Freq: Two times a day (BID) | ORAL | Status: DC

## 2015-01-15 ENCOUNTER — Telehealth: Payer: Self-pay

## 2015-01-15 NOTE — Telephone Encounter (Signed)
Patient is back to ask a question about lab results.

## 2015-01-16 NOTE — Telephone Encounter (Signed)
Left message for pt to call back  °

## 2015-01-19 NOTE — Telephone Encounter (Signed)
Patient states he's already spoken with the lab about his results. He was told to see if the pharmacy recommends an OTC cream. The pharmacy says he needs a prescription. Patient states that "the area is healing but he wants a cream for the scar" He is going out of town in 2 hrs. Can he get a script? Uses [facility] at [facility] and [address]. Cb# [phone].

## 2015-01-21 ENCOUNTER — Other Ambulatory Visit: Payer: Self-pay | Admitting: Family Medicine

## 2015-01-21 MED ORDER — MUPIROCIN 2 % EX OINT: TOPICAL_OINTMENT | CUTANEOUS | Status: DC

## 2015-01-24 ENCOUNTER — Telehealth: Payer: Self-pay

## 2015-01-24 NOTE — Telephone Encounter (Signed)
Pt called in reference to a letter that Dr. Joseph Art wrote for his insurance. Pt wants Dr. Joseph Art to take out the part about him having cataract surgery & the history of rubbing his eyes. He needs him to add the initial visit from July 17,2016 about him being seen for blurred vision due to being hit in the eye while playing basketball. He failed to mention that to the Dr. I explained to him that I wasn't sure that statement could be added to a letter if he didn't mention it on his initial visit. Please call patient with the information concerning the letter his insurance needs it as soon as possible.

## 2015-01-25 NOTE — Telephone Encounter (Signed)
Please tell Anthony [name] that I cannot change the record.  Please let him know I can add to the record about the basketball injury (done) and I can write his insurance company if he wants. KL

## 2015-01-25 NOTE — Telephone Encounter (Signed)
Spoke with pt, advised message from Dr. Joseph Art. He would like Dr. Joseph Art to proceed with letter.

## 2015-01-28 ENCOUNTER — Encounter: Payer: Self-pay | Admitting: Family Medicine

## 2015-01-29 NOTE — Telephone Encounter (Signed)
Notified pt letter is ready.  

## 2015-01-29 NOTE — Telephone Encounter (Signed)
Spoke to Dr L who advised we can correct the dates, but can not write about basketball injury bc we have no documentation of that. Dr L stated pt will have to get any further info in letter from the eye specialist. Advised pt of this and rewrote the letter to take out the info not needed about cataract surg and correct the date of surgery for dislocated lens. Mailed letter to pt at his request.

## 2015-01-29 NOTE — Telephone Encounter (Signed)
Pt had me read the letter to him, and he stated that the main thing the letter needs to address is that he was hit in the eye playing basketball the day before he came in to be seen. Also, the letter does not need to address the cataract surgery, but the surgery was in 1995 if Dr L includes it. The surgery to repair the dislocated lens was on 01/08/15. Dr L, can you change the letter to include these points?

## 2015-03-09 ENCOUNTER — Ambulatory Visit (INDEPENDENT_AMBULATORY_CARE_PROVIDER_SITE_OTHER): Payer: Federal, State, Local not specified - PPO | Admitting: Family Medicine

## 2015-03-09 VITALS — BP 138/86 | HR 74 | Temp 97.4°F | Resp 16 | Ht 67.0 in | Wt 216.0 lb

## 2015-03-09 DIAGNOSIS — B009 Herpesviral infection, unspecified: Secondary | ICD-10-CM

## 2015-03-09 DIAGNOSIS — F4310 Post-traumatic stress disorder, unspecified: Secondary | ICD-10-CM | POA: Diagnosis not present

## 2015-03-09 DIAGNOSIS — R0789 Other chest pain: Secondary | ICD-10-CM | POA: Diagnosis not present

## 2015-03-09 DIAGNOSIS — R21 Rash and other nonspecific skin eruption: Secondary | ICD-10-CM

## 2015-03-09 MED ORDER — MUPIROCIN 2 % EX OINT: TOPICAL_OINTMENT | CUTANEOUS | Status: DC

## 2015-03-09 MED ORDER — FLUOXETINE HCL 20 MG PO TABS: 20.0000 mg | ORAL_TABLET | Freq: Every day | ORAL | Status: DC

## 2015-03-09 MED ORDER — VALACYCLOVIR HCL 500 MG PO TABS: 500.0000 mg | ORAL_TABLET | Freq: Every day | ORAL | Status: DC

## 2015-03-09 NOTE — Patient Instructions (Signed)
Please return if you're not getting better within the week    Posttraumatic Stress Disorder Posttraumatic stress disorder (PTSD) is a mental disorder. It occurs after a traumatic event in your life. The traumatic events that cause PTSD are outside the range of normal human experience. Examples of these events include war, automobile accidents, natural disasters, rape, domestic violence, and violent crimes. Most people who experience these types of events are able to heal on their own. Those who do not heal develop PTSD. PTSD can happen to anyone at any age. However, people with a history of childhood abuse are at increased risk for developing PTSD.  SYMPTOMS  The traumatic event that causes PTSD must be a threat to life, cause serious injury, or involve sexual violence. The traumatic event is usually experienced directly by the person who develops PTSD. Sometimes PTSD occurs in people who witness traumas that occur to others or who hear about a trauma that occurs to a close family member or friend. The following behaviors are characteristic of people with PTSD:  People with PTSD re-experience the traumatic event in one or more of the following ways (intrusion symptoms):  Recurrent, unwanted distressing memories while awake.  Recurrent distressing dreams.  Sensations similar to those felt when the event originally occurred (flashbacks).   Intense or prolonged emotional distress, triggered by reminders of the trauma. This may include fear, horror, intense sadness, or anger.  Marked physical reactions, triggered by reminders of the trauma. This may include racing heart, shortness of breath, sweating, and shaking.  People with PTSD avoid thoughts, conversations, people, or activities that remind them of the traumatic event (avoidance symptoms).  People with PTSD have negative changes in their thinking and mood after the traumatic event. These changes include:  Inability to remember one or more  significant aspects of the traumatic event (memory gaps).  Exaggerated negative perceptions about themselves or others, such as believing that they are bad people or that no one can be trusted.  Unrealistic assignment of blame to themselves or others for the traumatic event.  Persistent negative emotional state, such as fear, horror, anger, sadness, guilt, or shame.  Markedly decreased interest or participation in significant activities.  A loss of connection with other people.  Inability to experience positive emotions, such as happiness or love.  People with PTSD are more sensitive to their environment and react more easily than others (hyperarousal-overreactivity symptoms). These symptoms include:  Irritability, with angry outbursts toward other people or objects. The outbursts are easily triggered and may be verbal or physical.  Careless or self-destructive behavior. This may include reckless driving or drug use.  A feeling of being on edge, with increased alertness (hypervigilance).  Exaggerated reactions to stimuli, such as being easily startled.   Difficulty concentrating.  Difficulty sleeping. PTSD symptoms may start soon after a frightening event or months or years later. They last at least 1 month or longer and can affect one or more areas of functioning, such as social or occupational functioning.  DIAGNOSIS  PTSD is diagnosed through an assessment by a mental health professional. Anthony [name] will be asked questions about the traumatic events in your life. You will also be asked about how these events have changed your thoughts, mood, behavior, and ability to function on a daily basis. You may be asked about your use of alcohol or drugs, which can make PTSD symptoms worse. TREATMENT  Unlike many mental disorders, which require lifelong management, PTSD is a curable condition. The goal of PTSD  treatment is to neutralize the negative effects of the traumatic event on daily  functioning, not erase the memory of the event. The following treatments may be prescribed to reach this goal:  Medicines. Certain medicines can reduce some PTSD symptoms. Intrusion symptoms and hyperarousal-overactivity symptoms respond best to medicines.  Counseling (talk therapy). Talk therapy with a mental health professional who is experienced in treating PTSD can help. Talk therapy can provide education, emotional support, and coping skills. Certain types of talk therapy that specifically target the traumatic events are the most effective treatment for PTSD:  Prolonged exposure therapy, which involves remembering and processing the traumatic event with a therapist in a safe environment until it no longer creates a negative emotional response.  Eye movement desensitization and reprocessing therapy, which involves the use of repetitive physical stimulation of the senses that alternates between the right and left sides of the body. It is believed that this therapy facilitates communication between the two sides of the brain. This communication helps the mind to integrate the fragmented memories of the traumatic event into a whole story that makes sense and no longer creates a negative emotional response. Most people with PTSD benefit from a combination of these treatments.    This information is not intended to replace advice given to you by your health care provider. Make sure you discuss any questions you have with your health care provider.   Document Released: 01/13/2001 Document Revised: 05/11/2014 Document Reviewed: 07/07/2012 Elsevier Interactive Patient Education [employer].

## 2015-03-09 NOTE — Progress Notes (Signed)
This chart was scribed for Anthony Haber, MD by Anthony [name], medical scribe at [facility].The patient was seen in exam room 14 and the patient's care was started at 8:36 AM.  Patient ID: Anthony [name] MRN: [id number], DOB: [birth year], 49 y.o. Date of Encounter: 03/09/2015  Primary Physician: Anthony Cobble, MD  Chief Complaint:  Chief Complaint  Patient presents with  . Anxiety    x 1 mon  . Headache    x 1 mon  . Chest Pain    x 1 mon  . Stress    HPI:  Anthony [name] is a 49 y.o. male who presents to [facility] complaining of anxiety with headache and chest pain for a month. He states that his chest pain is intermittent. It does not wake him up when he sleeps. Last night, when he was about to get some sleep before work, he felt his heart racing.   Family Hx Father has vascular disease.  Mother had passed away from heart attack, at age of 15  Stress He mentions that his job has been giving him trouble. He was falsely accused of harassment at work because he was out from work due to eye surgery. He was previously falsely accused for harassment at work as well; at the time, he was recovering from previous eye surgery with an eye patch over his eye. He's already suffering from PTSD from [place] history.   Eye surgery He had eye surgery done in his left eye at [facility]. He states that it's doing better and recovering well. He has a follow up with [facility] soon.   He works at the post [employer].   Past Medical History  Diagnosis Date  . Asthma   . Hyperlipidemia   . H/O renal calculi 2006  . Spondylosis   . Fatty liver   . Internal hemorrhoid   . Tubular adenoma of colon 2011    Dr Olevia Perches  . Cataract      Home Meds: Prior to Admission medications   Medication Sig Start Date End Date Taking? Authorizing Provider  albuterol (PROVENTIL HFA;VENTOLIN HFA) 108 (90 BASE) MCG/ACT inhaler Inhale 2 puffs into the lungs every 6 (six) hours as  needed. For shortness of breath   Yes Historical Provider, MD  HYDROcodone-acetaminophen (NORCO/VICODIN) 5-325 MG per tablet Take 1 tablet by mouth every 6 (six) hours as needed for pain. 09/02/12  Yes Rosalita Chessman, DO  mupirocin ointment (BACTROBAN) 2 % APP TO NOSE TID 01/21/15  Yes Anthony Haber, MD  SYMBICORT 160-4.5 MCG/ACT inhaler  10/26/14  Yes Historical Provider, MD  traMADol (ULTRAM) 50 MG tablet Take 50 mg by mouth every 6 (six) hours as needed. For pain   Yes Historical Provider, MD  valACYclovir (VALTREX) 500 MG tablet Take 1 tablet (500 mg total) by mouth 2 (two) times daily. After three days, reduce to once daily 01/14/15  Yes Anthony Haber, MD    Allergies: No Known Allergies  Social History   Social History  . Marital Status: Divorced    Spouse Name: N/A  . Number of Children: N/A  . Years of Education: N/A   Occupational History  . Not on file.   Social History Main Topics  . Smoking status: Never Smoker   . Smokeless tobacco: Not on file  . Alcohol Use: Yes     Comment: Socially , < 2X/ month  . Drug Use: No  . Sexual Activity: Not on  file   Other Topics Concern  . Not on file   Social History Narrative     Review of Systems: Constitutional: negative for fever, chills, night sweats, weight changes, or fatigue  HEENT: negative for vision changes, hearing loss, congestion, rhinorrhea, ST, epistaxis, or sinus pressure Cardiovascular: negative for palpitations; positive for chest pain Respiratory: negative for hemoptysis, wheezing, shortness of breath, or cough Abdominal: negative for abdominal pain, nausea, vomiting, diarrhea, or constipation Dermatological: negative for rash Neurologic: negative for dizziness, or syncope; positive for headache Psych: positive for anxious All other systems reviewed and are otherwise negative with the exception to those above and in the HPI.  Physical Exam: [name] pressure 138/86, pulse 74, temperature 97.4 F (36.3 C),  temperature source Oral, resp. rate 16, height 5\' 7"  (1.702 m), weight 216 lb (97.977 kg), SpO2 98 %., Body mass index is 33.82 kg/(m^2). General: Well developed, well nourished, in no acute distress. Head: Normocephalic, atraumatic, eyes without discharge, sclera non-icteric, nares are without discharge. Bilateral auditory canals clear, TM's are without perforation, pearly grey and translucent with reflective cone of light bilaterally. Oral cavity moist, posterior pharynx without exudate, erythema, peritonsillar abscess, or post nasal drip.  Neck: Supple. No thyromegaly. Full ROM. No lymphadenopathy. Lungs: Clear bilaterally to auscultation without wheezes, rales, or rhonchi. Breathing is unlabored. Heart: RRR with S1 S2. No murmurs, rubs, or gallops appreciated. Msk:  Strength and tone normal for age. Extremities/Skin: Warm and dry. No clubbing or cyanosis. No edema. No rashes or suspicious lesions. Neuro: Alert and oriented X 3. Moves all extremities spontaneously. Gait is normal. CNII-XII grossly in tact. Psych:  Responds to questions appropriately with a normal affect.   EKG: normal sinus rhythm  ASSESSMENT AND PLAN:  49 y.o. year old male with PTSD This chart was scribed in my presence and reviewed by me personally.    ICD-9-CM ICD-10-CM   1. Other chest pain 786.59 R07.89 EKG 12-Lead  2. HSV (herpes simplex virus) infection 054.9 B00.9 valACYclovir (VALTREX) 500 MG tablet  3. Rash and nonspecific skin eruption 782.1 R21 mupirocin ointment (BACTROBAN) 2 %  4. PTSD (post-traumatic stress disorder) 309.81 F43.10 FLUoxetine (PROZAC) 20 MG tablet      By signing my name below, I, Anthony [name], attest that this documentation has been prepared under the direction and in the presence of Anthony Haber, MD. Electronically Signed: Moises [name], Scribe. 03/09/2015 , 8:36 AM .  Signed, Anthony Haber, MD 03/09/2015 8:36 AM

## 2015-03-22 ENCOUNTER — Telehealth: Payer: Self-pay

## 2015-03-22 ENCOUNTER — Other Ambulatory Visit: Payer: Self-pay | Admitting: Family Medicine

## 2015-03-22 MED ORDER — BUSPIRONE HCL 10 MG PO TABS: 10.0000 mg | ORAL_TABLET | Freq: Three times a day (TID) | ORAL | Status: DC

## 2015-03-22 NOTE — Telephone Encounter (Signed)
Patient is experiencing anxiety and wants to know if he can get some medication of it.  Was seen on 03/09/15 for the same.   [phone]

## 2015-04-03 DIAGNOSIS — K922 Gastrointestinal hemorrhage, unspecified: Secondary | ICD-10-CM | POA: Insufficient documentation

## 2015-04-03 DIAGNOSIS — R3129 Other microscopic hematuria: Secondary | ICD-10-CM | POA: Insufficient documentation

## 2015-04-03 DIAGNOSIS — R55 Syncope and collapse: Secondary | ICD-10-CM | POA: Insufficient documentation

## 2015-04-03 DIAGNOSIS — K2980 Duodenitis without bleeding: Secondary | ICD-10-CM | POA: Insufficient documentation

## 2015-04-03 DIAGNOSIS — R42 Dizziness and giddiness: Secondary | ICD-10-CM | POA: Insufficient documentation

## 2015-04-04 DIAGNOSIS — K648 Other hemorrhoids: Secondary | ICD-10-CM | POA: Insufficient documentation

## 2015-04-17 ENCOUNTER — Inpatient Hospital Stay: Payer: Federal, State, Local not specified - PPO | Admitting: Internal Medicine

## 2015-04-22 ENCOUNTER — Encounter: Payer: Self-pay | Admitting: Internal Medicine

## 2015-04-22 ENCOUNTER — Other Ambulatory Visit (INDEPENDENT_AMBULATORY_CARE_PROVIDER_SITE_OTHER): Payer: Federal, State, Local not specified - PPO

## 2015-04-22 ENCOUNTER — Ambulatory Visit (INDEPENDENT_AMBULATORY_CARE_PROVIDER_SITE_OTHER): Payer: Federal, State, Local not specified - PPO | Admitting: Internal Medicine

## 2015-04-22 VITALS — BP 130/86 | HR 94 | Temp 98.2°F | Resp 20 | Ht 66.0 in | Wt 209.2 lb

## 2015-04-22 DIAGNOSIS — K625 Hemorrhage of anus and rectum: Secondary | ICD-10-CM

## 2015-04-22 DIAGNOSIS — R3989 Other symptoms and signs involving the genitourinary system: Secondary | ICD-10-CM

## 2015-04-22 DIAGNOSIS — M79662 Pain in left lower leg: Secondary | ICD-10-CM

## 2015-04-22 DIAGNOSIS — Z566 Other physical and mental strain related to work: Secondary | ICD-10-CM | POA: Diagnosis not present

## 2015-04-22 DIAGNOSIS — N3289 Other specified disorders of bladder: Secondary | ICD-10-CM | POA: Diagnosis not present

## 2015-04-22 LAB — URINALYSIS
Bilirubin Urine: NEGATIVE
Ketones, ur: NEGATIVE
Leukocytes, UA: NEGATIVE
Nitrite: NEGATIVE
Specific Gravity, Urine: 1.02 (ref 1.000–1.030)
Total Protein, Urine: NEGATIVE
Urine Glucose: NEGATIVE
Urobilinogen, UA: 0.2 (ref 0.0–1.0)
pH: 6 (ref 5.0–8.0)

## 2015-04-22 LAB — CBC WITH DIFFERENTIAL/PLATELET
Basophils Absolute: 0.1 10*3/uL (ref 0.0–0.1)
Basophils Relative: 0.8 % (ref 0.0–3.0)
Eosinophils Absolute: 0.3 10*3/uL (ref 0.0–0.7)
Eosinophils Relative: 3.6 % (ref 0.0–5.0)
HCT: 37.3 % — ABNORMAL LOW (ref 39.0–52.0)
Hemoglobin: 12 g/dL — ABNORMAL LOW (ref 13.0–17.0)
Lymphocytes Relative: 25.5 % (ref 12.0–46.0)
Lymphs Abs: 1.9 10*3/uL (ref 0.7–4.0)
MCHC: 32.2 g/dL (ref 30.0–36.0)
MCV: 87.6 fl (ref 78.0–100.0)
Monocytes Absolute: 0.7 10*3/uL (ref 0.1–1.0)
Monocytes Relative: 9 % (ref 3.0–12.0)
Neutro Abs: 4.4 10*3/uL (ref 1.4–7.7)
Neutrophils Relative %: 61.1 % (ref 43.0–77.0)
Platelets: 363 10*3/uL (ref 150.0–400.0)
RBC: 4.26 Mil/uL (ref 4.22–5.81)
RDW: 18.4 % — ABNORMAL HIGH (ref 11.5–15.5)
WBC: 7.3 10*3/uL (ref 4.0–10.5)

## 2015-04-22 LAB — BASIC METABOLIC PANEL
BUN: 17 mg/dL (ref 6–23)
CO2: 25 mEq/L (ref 19–32)
Calcium: 9.6 mg/dL (ref 8.4–10.5)
Chloride: 112 mEq/L (ref 96–112)
Creatinine, Ser: 1.17 mg/dL (ref 0.40–1.50)
GFR: 85.48 mL/min (ref >60.00)
Glucose, Bld: 91 mg/dL (ref 70–99)
Potassium: 4.2 mEq/L (ref 3.5–5.1)
Sodium: 143 mEq/L (ref 135–145)

## 2015-04-22 NOTE — Progress Notes (Signed)
Pre visit review using our clinic review tool, if applicable. No additional management support is needed unless otherwise documented below in the visit note. 

## 2015-04-22 NOTE — Progress Notes (Signed)
   Subjective:    Patient ID: Anthony [name], male    DOB: Nov 10, 1965, 49 y.o.   MRN: [id number]  HPI He is here with multiple issues.  He was hospitalized 12/30-12/1/16 in [place] with acute GI hemorrhage. He states that internal hemorrhoidal bleeding was diagnosed based on upper and lower endoscopies. Symptoms presented as nausea and black stool suggesting melena. 30 minutes after this he passed frank blood rectally. There was no associated pain.  He had been on naproxen from his orthopedist.  He now describes cramps in the left calf. Apparently he's been on a diuretic type agent to reduce pressure in the left eye. He is followed at [facility] for this. He said he stopped the medication because the cramps. Supplementing with bananas and OJ has not helped.  Additionally he describes profound stress on the job. He states that another employee is accusing him of harassment. He is going through the union concerning this matter. He states that this has flared his PTSD. He is followed for this at the [facility]. He saw them last week.  Review of Systems Unexplained weight loss, abdominal pain, significant dyspepsia, dysphagia, melena, rectal bleeding, or persistently small caliber stools are denied.  Since discharge he has noted sensation of incomplete voiding with urination.Dysuria, pyuria, hematuria, frequency, nocturia or polyuria are denied.    Objective:   Physical Exam  Pertinent or positive findings include: The left iris is discolored and distorted. He has a Engineering geologist. The left calf is tender to compression.  General appearance :adequately nourished; in no distress.  Eyes: No conjunctival inflammation or scleral icterus is present.  Oral exam:  Lips and gums are healthy appearing.There is no oropharyngeal erythema or exudate noted. Dental hygiene is good.  Heart:  Normal rate and regular rhythm. S1 and S2 normal without gallop, murmur, click, rub or other extra sounds     Lungs:Chest clear to auscultation; no wheezes, rhonchi,rales ,or rubs present.No increased work of breathing.   Abdomen: bowel sounds normal, soft and non-tender without masses, organomegaly or hernias noted.  No guarding or rebound.   Vascular : all pulses equal ; no bruits present.  Skin:Warm & dry.  Intact without suspicious lesions or rashes ; no tenting .  Lymphatic: No lymphadenopathy is noted about the head, neck, axilla.   Neuro: Strength, tone & DTRs normal.Oriented X 3     Assessment & Plan:  #1 gastrointestinal bleeding. History suggests melena which would not be due to internal hemorrhoidal bleeding. As he was told there was no exacerbation of his ulcer; this may represent bleeding from AV malformation in the small intestine. Camera endoscopy may be necessary. CBC will be checked.  #2 leg cramps; rule out DVT.  #3 exogenous stress. Have encouraged him to continue follow-up through the [place]. 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  #4 sensation of incomplete voiding

## 2015-04-22 NOTE — Patient Instructions (Addendum)
  Your next office appointment will be determined based upon review of your pending labs  Those written interpretation of the lab results and instructions will be transmitted to you by My Chart Critical results will be called. Followup as needed for any active or acute issue. Please report any significant change in your symptoms.  Reflux of gastric acid may be asymptomatic as this may occur mainly during sleep.The triggers for reflux  include stress; the "aspirin family" ; alcohol; peppermint; and caffeine (coffee, tea, cola, and chocolate). The aspirin family would include aspirin and the nonsteroidal agents such as ibuprofen &  Naproxen. Tylenol would not cause reflux. If having symptoms ; food & drink should be avoided for @ least 2 hours before going to bed.   Drink as much nondairy fluids as possible. Avoid spicy foods or alcohol as  these may aggravate the bladder / prostate. Do not take decongestants. Avoid narcotics if possible.

## 2015-04-23 ENCOUNTER — Other Ambulatory Visit: Payer: Self-pay | Admitting: Internal Medicine

## 2015-04-23 DIAGNOSIS — M79662 Pain in left lower leg: Secondary | ICD-10-CM

## 2015-04-23 DIAGNOSIS — D649 Anemia, unspecified: Secondary | ICD-10-CM

## 2015-04-23 DIAGNOSIS — R7989 Other specified abnormal findings of blood chemistry: Secondary | ICD-10-CM

## 2015-04-23 LAB — D-DIMER, QUANTITATIVE: D-Dimer, Quant: 0.86 ug/mL-FEU — ABNORMAL HIGH (ref 0.00–0.48)

## 2015-04-24 NOTE — Progress Notes (Signed)
Pt scheduled for this Friday, 04/26/15. Left message for patient to call office.

## 2015-04-25 ENCOUNTER — Encounter: Payer: Self-pay | Admitting: Internal Medicine

## 2015-04-25 DIAGNOSIS — K269 Duodenal ulcer, unspecified as acute or chronic, without hemorrhage or perforation: Secondary | ICD-10-CM | POA: Insufficient documentation

## 2015-04-26 ENCOUNTER — Telehealth: Payer: Self-pay | Admitting: Internal Medicine

## 2015-04-26 ENCOUNTER — Other Ambulatory Visit (INDEPENDENT_AMBULATORY_CARE_PROVIDER_SITE_OTHER): Payer: Federal, State, Local not specified - PPO

## 2015-04-26 ENCOUNTER — Ambulatory Visit (INDEPENDENT_AMBULATORY_CARE_PROVIDER_SITE_OTHER): Payer: Federal, State, Local not specified - PPO | Admitting: Internal Medicine

## 2015-04-26 ENCOUNTER — Telehealth: Payer: Self-pay | Admitting: *Deleted

## 2015-04-26 ENCOUNTER — Ambulatory Visit (HOSPITAL_COMMUNITY)
Admission: RE | Admit: 2015-04-26 | Discharge: 2015-04-26 | Disposition: A | Discharge status: Home / Self Care | Payer: Federal, State, Local not specified - PPO | Source: Ambulatory Visit | Attending: Internal Medicine | Admitting: Internal Medicine

## 2015-04-26 ENCOUNTER — Encounter: Payer: Self-pay | Admitting: Internal Medicine

## 2015-04-26 VITALS — BP 130/84 | HR 82 | Temp 97.4°F | Resp 16

## 2015-04-26 DIAGNOSIS — E785 Hyperlipidemia, unspecified: Secondary | ICD-10-CM | POA: Insufficient documentation

## 2015-04-26 DIAGNOSIS — I824Z2 Acute embolism and thrombosis of unspecified deep veins of left distal lower extremity: Secondary | ICD-10-CM | POA: Diagnosis not present

## 2015-04-26 DIAGNOSIS — I82409 Acute embolism and thrombosis of unspecified deep veins of unspecified lower extremity: Secondary | ICD-10-CM | POA: Insufficient documentation

## 2015-04-26 DIAGNOSIS — I82492 Acute embolism and thrombosis of other specified deep vein of left lower extremity: Secondary | ICD-10-CM | POA: Insufficient documentation

## 2015-04-26 DIAGNOSIS — K269 Duodenal ulcer, unspecified as acute or chronic, without hemorrhage or perforation: Secondary | ICD-10-CM | POA: Diagnosis not present

## 2015-04-26 DIAGNOSIS — R7989 Other specified abnormal findings of blood chemistry: Secondary | ICD-10-CM

## 2015-04-26 DIAGNOSIS — R791 Abnormal coagulation profile: Secondary | ICD-10-CM | POA: Insufficient documentation

## 2015-04-26 DIAGNOSIS — M79662 Pain in left lower leg: Secondary | ICD-10-CM | POA: Diagnosis not present

## 2015-04-26 DIAGNOSIS — D649 Anemia, unspecified: Secondary | ICD-10-CM | POA: Diagnosis not present

## 2015-04-26 LAB — CBC WITH DIFFERENTIAL/PLATELET
Basophils Absolute: 0 10*3/uL (ref 0.0–0.1)
Basophils Relative: 0.5 % (ref 0.0–3.0)
Eosinophils Absolute: 0.2 10*3/uL (ref 0.0–0.7)
Eosinophils Relative: 2.7 % (ref 0.0–5.0)
HCT: 39.3 % (ref 39.0–52.0)
Hemoglobin: 12.5 g/dL — ABNORMAL LOW (ref 13.0–17.0)
Lymphocytes Relative: 24.2 % (ref 12.0–46.0)
Lymphs Abs: 1.5 10*3/uL (ref 0.7–4.0)
MCHC: 31.8 g/dL (ref 30.0–36.0)
MCV: 87.9 fl (ref 78.0–100.0)
Monocytes Absolute: 0.5 10*3/uL (ref 0.1–1.0)
Monocytes Relative: 8.3 % (ref 3.0–12.0)
Neutro Abs: 4 10*3/uL (ref 1.4–7.7)
Neutrophils Relative %: 64.3 % (ref 43.0–77.0)
Platelets: 369 10*3/uL (ref 150.0–400.0)
RBC: 4.47 Mil/uL (ref 4.22–5.81)
RDW: 18.1 % — ABNORMAL HIGH (ref 11.5–15.5)
WBC: 6.2 10*3/uL (ref 4.0–10.5)

## 2015-04-26 LAB — IBC PANEL
Iron: 68 ug/dL (ref 42–165)
Saturation Ratios: 16 % — ABNORMAL LOW (ref 20.0–50.0)
Transferrin: 304 mg/dL (ref 212.0–360.0)

## 2015-04-26 LAB — VITAMIN B12: Vitamin B-12: 402 pg/mL (ref 211–911)

## 2015-04-26 NOTE — Assessment & Plan Note (Signed)
Enteric-coated aspirin 325 mg daily after a full meal.  Coagulopathy panel.  If there is progression of venous thrombosis; Lovenox would be considered.

## 2015-04-26 NOTE — Patient Instructions (Addendum)
Reflux of gastric acid may be asymptomatic as this may occur mainly during sleep.The triggers for reflux  include stress; the "aspirin family" ; alcohol; peppermint; and caffeine (coffee, tea, cola, and chocolate). The aspirin family would include aspirin and the nonsteroidal agents such as ibuprofen &  Naproxen. Tylenol would not cause reflux. If having symptoms ; food & drink should be avoided for @ least 2 hours before going to bed.   Take the protein pump inhibitor Rxed by Dr Shary Key 30 minutes before breakfast and 30 minutes before the evening meal for 8 weeks GI consultation recommended to monitor ulcer..   Please take enteric-coated aspirin 325 mg daily AFTER FULL  Breakfast.  To ER if having chest or abdominal pain ; shortness of breath; or rectal bleeding.

## 2015-04-26 NOTE — Telephone Encounter (Signed)
Received call pt states he fail to ask md does he need to have any restrictions at work. He work 3rd shift at the [employer] and stand up all night at least 8-10 hours...Anthony [name]

## 2015-04-26 NOTE — Progress Notes (Signed)
   Subjective:    Patient ID: Anthony [name], male    DOB: Apr 23, 1966, 49 y.o.   MRN: [id number]  HPI The venous Doppler reveals acute  venous thrombosis in the paired soleal veins of the left calf. He states the discomfort has improved and he has no active cardiopulmonary or GI symptoms.  The calf began to hurt him 04/19/15 upon awakening. There was no trigger or injury except for a 4 hour flight 04/10/15. He nor family have any history of any clotting disorders.  The [facility] records were reviewed when received 12/22; he was found to have a duodenal ulcer which was not bleeding at the time of endoscopy. Initial presentation of his symptoms was as melena 1. He did have internal hemorrhoids which were bleeding.   Review of Systems  There is no significant cough, sputum production,hemoptysis, wheezing,or  paroxysmal nocturnal dyspnea. Unexplained weight loss, abdominal pain, significant dyspepsia, dysphagia, melena, rectal bleeding, or persistently small caliber stools are not present. Dysuria, pyuria, hematuria, frequency, nocturia or polyuria are denied.    Objective:   Physical Exam Pertinent or positive findings include: Homans sign left calf is negative today.It was positive 12/19.  General appearance :adequately nourished; in no distress.  Eyes: No conjunctival inflammation or scleral icterus is present.  Oral exam:  Lips and gums are healthy appearing.There is no oropharyngeal erythema or exudate noted. Dental hygiene is good.  Heart:  Normal rate and regular rhythm. S1 and S2 normal without gallop, murmur, click, rub or other extra sounds    Lungs:Chest clear to auscultation; no wheezes, rhonchi,rales ,or rubs present.No increased work of breathing.   Abdomen: bowel sounds normal, soft and non-tender without masses, organomegaly or hernias noted.  No guarding or rebound.Vascular : all pulses equal ; no bruits present.  Skin:Warm & dry.  Intact without suspicious  lesions or rashes ; no tenting    Lymphatic: No lymphadenopathy is noted about the head, neck, axilla.   Neuro: Strength, tone normal.     Assessment & Plan:  See Current Assessment & Plan in Problem List under specific Diagnosis

## 2015-04-26 NOTE — Progress Notes (Signed)
Pre visit review using our clinic review tool, if applicable. No additional management support is needed unless otherwise documented below in the visit note. 

## 2015-04-26 NOTE — Addendum Note (Signed)
Addended by: Terence Lux B on: 04/26/2015 03:21 PM   Modules accepted: Medications

## 2015-04-26 NOTE — Assessment & Plan Note (Addendum)
Avoid the triggers for reflux  Including  the "aspirin family" ; alcohol; peppermint; and caffeine (coffee, tea, cola, and chocolate). The aspirin family would include aspirin (ONLY the one ASA a day) and the nonsteroidal agents such as ibuprofen &  Naproxen. Tylenol would not cause reflux. If having symptoms ; food & drink should be avoided for @ least 2 hours before going to bed.  Eat 3 meals a day. To take the protein pump inhibitor Rxed by Dr Shary Key 30 minutes before breakfast and 30 minutes before the evening meal for 8 weeks. GI referral

## 2015-04-26 NOTE — Telephone Encounter (Signed)
Just wear over the calf socks which have good elasticity;not ankle high socks

## 2015-04-26 NOTE — Telephone Encounter (Signed)
Can we enter these as new meds please? I asked him to increase the Pantoprazole bid X 8 weeks due to ASA therapy and recent duodenal ulcer

## 2015-04-26 NOTE — Telephone Encounter (Signed)
Patient is calling back to advise of the medication that he is taking per your request:   Pantoprazole 40 MG - take 1 tab daily  Acetazolamide 250 MG- take 4 times per day for 15 days

## 2015-04-26 NOTE — Telephone Encounter (Signed)
Notified pt with md response.../lmb 

## 2015-04-30 ENCOUNTER — Telehealth: Payer: Self-pay | Admitting: Internal Medicine

## 2015-04-30 NOTE — Telephone Encounter (Signed)
Entered

## 2015-04-30 NOTE — Telephone Encounter (Signed)
[facility]  Patient Name: Anthony [name]  DOB: [birth year]    Initial Comment Caller states he was seen Friday with a blood clot in his leg and he has asthma and when he does physical activity he has shortness of breath. He is not currently having shortness of breath, but wants to know why his doctor didn't take him out of work for a blood clot in his leg and he wants to know how he will know that he is better.   Nurse Assessment  Nurse: Harlow Mares, RN, Suanne Marker Date/Time Eilene Ghazi Time): 04/30/2015 4:58:08 PM  Confirm and document reason for call. If symptomatic, describe symptoms. ---Caller states he was seen Friday with a blood clot in his leg and he has asthma and when he does physical activity he has shortness of breath. He is not currently having shortness of breath, but wants to know why his doctor didn't take him out of work for a blood clot in his leg and he wants to know how he will know that he is better. He is currently taking Ecotrin for the blood clot. Reports that he was told to call for any new or worsening symptoms. No SOB when walking to [place], just SOB with physical activity (was wrestling with his friend and became SOB). Reports that he doesn't an MD appt until Feb. Reports that Dr. Linna Darner is retiring and he isn't sure what to do to follow up.  Has the patient traveled out of the country within the last 30 days? ---No  Does the patient have any new or worsening symptoms? ---Yes  Will a triage be completed? ---Yes  Related visit to physician within the last 2 weeks? ---Yes  Does the PT have any chronic conditions? (i.e. diabetes, asthma, etc.) ---Yes  List chronic conditions. ---asthma, ulcers (recently hospitalized)  Is this a behavioral health or substance abuse call? ---No     Guidelines    Guideline Title Affirmed Question Affirmed Notes  Breathing Difficulty History of prior "blood clot" in leg or lungs (i.e., deep vein thrombosis, pulmonary  embolism)    Final Disposition User   Go to ED Now Harlow Mares, RN, Suanne Marker    Comments  Patient requesting for someone in the MD office to call him to discuss if he should be going to work with the dx of blood clot in the leg. Caller reports that he is no longer SOB but that he would like to make a follow up appt with the MD office. Appt made with Dr. Cathlean Cower on Thursday for 11am, caller voiced understanding.   Referrals  GO TO FACILITY REFUSED   Disagree/Comply: Disagree  Disagree/Comply Reason: Disagree with instructions

## 2015-05-02 ENCOUNTER — Ambulatory Visit (INDEPENDENT_AMBULATORY_CARE_PROVIDER_SITE_OTHER)
Admission: RE | Admit: 2015-05-02 | Discharge: 2015-05-02 | Disposition: A | Discharge status: Home / Self Care | Payer: Federal, State, Local not specified - PPO | Source: Ambulatory Visit | Attending: Internal Medicine | Admitting: Internal Medicine

## 2015-05-02 ENCOUNTER — Encounter: Payer: Self-pay | Admitting: Internal Medicine

## 2015-05-02 ENCOUNTER — Other Ambulatory Visit (INDEPENDENT_AMBULATORY_CARE_PROVIDER_SITE_OTHER): Payer: Federal, State, Local not specified - PPO

## 2015-05-02 ENCOUNTER — Ambulatory Visit (INDEPENDENT_AMBULATORY_CARE_PROVIDER_SITE_OTHER): Payer: Federal, State, Local not specified - PPO | Admitting: Internal Medicine

## 2015-05-02 VITALS — BP 116/78 | HR 82 | Temp 97.7°F | Ht 66.0 in | Wt 213.0 lb

## 2015-05-02 DIAGNOSIS — R791 Abnormal coagulation profile: Secondary | ICD-10-CM | POA: Diagnosis not present

## 2015-05-02 DIAGNOSIS — J069 Acute upper respiratory infection, unspecified: Secondary | ICD-10-CM | POA: Diagnosis not present

## 2015-05-02 DIAGNOSIS — R7989 Other specified abnormal findings of blood chemistry: Secondary | ICD-10-CM

## 2015-05-02 DIAGNOSIS — R0600 Dyspnea, unspecified: Secondary | ICD-10-CM | POA: Diagnosis not present

## 2015-05-02 LAB — BASIC METABOLIC PANEL
BUN: 16 mg/dL (ref 6–23)
CO2: 22 mEq/L (ref 19–32)
Calcium: 9.2 mg/dL (ref 8.4–10.5)
Chloride: 113 mEq/L — ABNORMAL HIGH (ref 96–112)
Creatinine, Ser: 1.23 mg/dL (ref 0.40–1.50)
GFR: 80.68 mL/min (ref >60.00)
Glucose, Bld: 94 mg/dL (ref 70–99)
Potassium: 4 mEq/L (ref 3.5–5.1)
Sodium: 140 mEq/L (ref 135–145)

## 2015-05-02 LAB — RFX PTT-LA W/RFX TO HEX PHASE CONF: PTT-LA Screen: 36 s (ref <=40)

## 2015-05-02 LAB — RFX DRVVT SCR W/RFLX CONF 1:1 MIX: dRVVT Screen: 38 s (ref <=45)

## 2015-05-02 MED ORDER — PANTOPRAZOLE SODIUM 40 MG PO TBEC: 40.0000 mg | DELAYED_RELEASE_TABLET | Freq: Every day | ORAL | Status: DC

## 2015-05-02 MED ORDER — APIXABAN 5 MG PO TABS: 5.0000 mg | ORAL_TABLET | Freq: Two times a day (BID) | ORAL | Status: DC

## 2015-05-02 MED ORDER — AZITHROMYCIN 250 MG PO TABS: ORAL_TABLET | ORAL | Status: DC

## 2015-05-02 MED ORDER — IOHEXOL 350 MG/ML SOLN
80.0000 mL | Freq: Once | INTRAVENOUS | Status: AC | PRN
  Administered 2015-05-02: 80 mL via INTRAVENOUS

## 2015-05-02 MED ORDER — ALBUTEROL SULFATE HFA 108 (90 BASE) MCG/ACT IN AERS: 2.0000 | INHALATION_SPRAY | Freq: Four times a day (QID) | RESPIRATORY_TRACT | Status: DC | PRN

## 2015-05-02 MED ORDER — PREDNISONE 10 MG PO TABS: ORAL_TABLET | ORAL | Status: DC

## 2015-05-02 NOTE — Patient Instructions (Addendum)
NEW from this morning-  OK to stop the Aspirin  Please take all new medication as prescribed - the Eliquis 5 mg twice per day  Please return in 1 week, to repeat the blood count with the recent stomach ulcer; your protnix was refilled as well to take at ONE per day after your current prescription at TWO per day is finished    From this AM:  Please take all new medication as prescribed - the antibiotic and prednisone  Please continue all other medications as before, and refills have been done if requested - the inhaler  Please have the pharmacy call with any other refills you may need.  Please keep your appointments with your specialists as you may have planned  You will be contacted regarding the referral for: CT chest (to see [facility] now)  Please go to the LAB in the Basement (turn left off the elevator) for the tests to be done today  You will be contacted by phone if any changes need to be made immediately.  Otherwise, you will receive a letter about your results with an explanation, but please check with MyChart first.  Please remember to sign up for MyChart if you have not done so, as this will be important to you in the future with finding out test results, communicating by private email, and scheduling acute appointments online when needed.

## 2015-05-02 NOTE — Progress Notes (Signed)
Subjective:    Patient ID: Anthony [name], male    DOB: [birth year], 49 y.o.   MRN: [id number]  HPI  Here with sob - c/o 3 days onset after seeing family over the weekend, had a ST for a day last weekend resolved, but now with sob and ? Mild wheeze, so had to use inhaler more often last few days.  Had to stop in the middle of intercourse 3 days ago due to sob.  Had to stop play wrestling with another person 2 days ago due to same.  No fever, HA, ear pain or sinus congestion.  Has little to no cough.  Did have recent LLE distal soleal vein thrombosis, and has been taking the ecotrin since then, also recent hx fo GI bleed, not sure about anemia.  No hx of transfusion needed, or PE.  Recent d dimer also elevated.  Overall left calf pain resolved, and believes swelling improved as well Past Medical History  Diagnosis Date  . Asthma   . Hyperlipidemia   . H/O renal calculi 2006  . Spondylosis   . Fatty liver   . Internal hemorrhoid   . Tubular adenoma of colon 2011    Dr Olevia Perches  . Cataract    Past Surgical History  Procedure Laterality Date  . Cystoscopy w/ stone manipulation  2006  . Shoulder surgery  12/17/2010    Dr French Ana ; shoulder impingement & torn tendon  . Inguinal hernia repair  1990  . Colonoscopy with polypectomy  2011    Dr Olevia Perches, hemorrhoids  . Cataract extraction  1996    Dr Elonda Husky; OS  . Eye surgery Left 1996    cataract surgery    reports that he has never smoked. He does not have any smokeless tobacco history on file. He reports that he drinks alcohol. He reports that he does not use illicit drugs. family history includes Cancer in his paternal grandmother; Diabetes in his father; Glaucoma in his father; Heart attack in his maternal uncle; Heart attack (age of onset: 3) in his mother; Heart attack (age of onset: 52) in his maternal grandmother; Stroke (age of onset: 50) in his father. There is no history of Asthma or COPD. No Known Allergies Current Outpatient  Prescriptions on File Prior to Visit  Medication Sig Dispense Refill  . acetaZOLAMIDE (DIAMOX) 250 MG tablet Take 250 mg by mouth 4 (four) times daily.    Marland Kitchen albuterol (PROVENTIL HFA;VENTOLIN HFA) 108 (90 BASE) MCG/ACT inhaler Inhale 2 puffs into the lungs every 6 (six) hours as needed. For shortness of breath    . aspirin (ECOTRIN) 325 MG EC tablet Take 1 tablet (325 mg total) by mouth daily. 30 tablet 1  . busPIRone (BUSPAR) 10 MG tablet Take 1 tablet (10 mg total) by mouth 3 (three) times daily. 90 tablet 1  . FLUoxetine (PROZAC) 20 MG tablet Take 1 tablet (20 mg total) by mouth daily. 30 tablet 3  . HYDROcodone-acetaminophen (NORCO/VICODIN) 5-325 MG per tablet Take 1 tablet by mouth every 6 (six) hours as needed for pain. 30 tablet 0  . mupirocin ointment (BACTROBAN) 2 % App to skin irritation tid prn 22 g 5  . pantoprazole (PROTONIX) 40 MG tablet Take 40 mg by mouth 2 (two) times daily.    . SYMBICORT 160-4.5 MCG/ACT inhaler     . traMADol (ULTRAM) 50 MG tablet Take 50 mg by mouth every 6 (six) hours as needed. For pain    . valACYclovir (VALTREX)  500 MG tablet Take 1 tablet (500 mg total) by mouth daily. 90 tablet 3   No current facility-administered medications on file prior to visit.   Review of Systems  Constitutional: Negative for unusual diaphoresis or night sweats HENT: Negative for ringing in ear or discharge Eyes: Negative for double vision or worsening visual disturbance.  Respiratory: Negative for choking and stridor.   Gastrointestinal: Negative for vomiting or other signifcant bowel change Genitourinary: Negative for hematuria or change in urine volume.  Musculoskeletal: Negative for other MSK pain or swelling Skin: Negative for color change and worsening wound.  Neurological: Negative for tremors and numbness other than noted  Psychiatric/Behavioral: Negative for decreased concentration or agitation other than above       Objective:   Physical Exam BP 116/78 mmHg   Pulse 82  Temp(Src) 97.7 F (36.5 C) (Oral)  Ht 5\' 6"  (1.676 m)  Wt 213 lb (96.616 kg)  BMI 34.40 kg/m2  SpO2 98% VS noted,  Constitutional: Pt appears in no significant distress HENT: Head: NCAT.  Right Ear: External ear normal.  Left Ear: External ear normal.  Eyes: . Pupils are equal, round, and reactive to light. Conjunctivae and EOM are normal Neck: Normal range of motion. Neck supple.  Cardiovascular: Normal rate and regular rhythm.   Pulmonary/Chest: Effort normal and breath sounds mild decreased without rales, very few scattered wheeze.  Neurological: Pt is alert. Not confused , motor grossly intact Skin: Skin is warm. No rash, no LE edema except trace LLE Psychiatric: Pt behavior is normal. No agitation.     Assessment & Plan:

## 2015-05-02 NOTE — Progress Notes (Signed)
Pre visit review using our clinic review tool, if applicable. No additional management support is needed unless otherwise documented below in the visit note. 

## 2015-05-02 NOTE — Assessment & Plan Note (Signed)
With recent small DVT - on asa only, to cont this for now, will need eliquis or similar if PE found on CTA

## 2015-05-02 NOTE — Assessment & Plan Note (Signed)
Mild to mod, for antibx course,  to f/u any worsening symptoms or concerns 

## 2015-05-02 NOTE — Addendum Note (Signed)
Addended by: Biagio Borg on: 05/02/2015 03:10 PM   Modules accepted: Orders, Medications

## 2015-05-02 NOTE — Progress Notes (Signed)
   Subjective:    Patient ID: Anthony [name], male    DOB: March 31, 1966, 49 y.o.   MRN: [id number]  HPI  Pt present in f/u after CTA chest c/w RLL PE (2 small defects noted)   IMPRESSION: Small peripheral right lower lobe pulmonary emboli.  Patchy ground-glass opacity in the lungs is likely mild edema or inflammation/alveolitis. No pulmonary infarction or pleural effusion.  Normal heart and aorta.   Review of Systems     Objective:   Physical Exam        Assessment & Plan:  Acute PE:  Pt with mild symptoms, no sign of acute resp failure, no overt bleeding with recent reported gastric ulcer/GI bleed; numerous questions answered today  OK to d/c asa, start eliquis 5 bid, and f/u 1 wk with plan to repeat cbc

## 2015-05-02 NOTE — Assessment & Plan Note (Addendum)
Suspect main issue is mild bronchospasm/asthma exac related to infection, but cant r/o PE based on hx - for CTA chest now (after BMP stat)  Note:  Total time for pt hx, exam, review of record with pt in the room, determination of diagnoses and plan for further eval and tx is > 40 min, with over 50% spent in coordination and counseling of patient

## 2015-05-03 ENCOUNTER — Emergency Department (HOSPITAL_COMMUNITY): Payer: Federal, State, Local not specified - PPO

## 2015-05-03 ENCOUNTER — Encounter (HOSPITAL_COMMUNITY): Payer: Self-pay | Admitting: Emergency Medicine

## 2015-05-03 ENCOUNTER — Telehealth: Payer: Self-pay | Admitting: Internal Medicine

## 2015-05-03 ENCOUNTER — Emergency Department (HOSPITAL_COMMUNITY)
Admission: EM | Admit: 2015-05-03 | Discharge: 2015-05-03 | Disposition: A | Discharge status: Home / Self Care | Payer: Federal, State, Local not specified - PPO | Attending: Emergency Medicine | Admitting: Emergency Medicine

## 2015-05-03 DIAGNOSIS — Z7902 Long term (current) use of antithrombotics/antiplatelets: Secondary | ICD-10-CM | POA: Diagnosis not present

## 2015-05-03 DIAGNOSIS — Z8739 Personal history of other diseases of the musculoskeletal system and connective tissue: Secondary | ICD-10-CM | POA: Diagnosis not present

## 2015-05-03 DIAGNOSIS — J45901 Unspecified asthma with (acute) exacerbation: Secondary | ICD-10-CM | POA: Diagnosis not present

## 2015-05-03 DIAGNOSIS — Z79899 Other long term (current) drug therapy: Secondary | ICD-10-CM | POA: Insufficient documentation

## 2015-05-03 DIAGNOSIS — Z8719 Personal history of other diseases of the digestive system: Secondary | ICD-10-CM | POA: Diagnosis not present

## 2015-05-03 DIAGNOSIS — R0602 Shortness of breath: Secondary | ICD-10-CM

## 2015-05-03 DIAGNOSIS — Z8639 Personal history of other endocrine, nutritional and metabolic disease: Secondary | ICD-10-CM | POA: Diagnosis not present

## 2015-05-03 DIAGNOSIS — Z8701 Personal history of pneumonia (recurrent): Secondary | ICD-10-CM | POA: Insufficient documentation

## 2015-05-03 DIAGNOSIS — R079 Chest pain, unspecified: Secondary | ICD-10-CM | POA: Diagnosis present

## 2015-05-03 DIAGNOSIS — I2699 Other pulmonary embolism without acute cor pulmonale: Secondary | ICD-10-CM | POA: Diagnosis not present

## 2015-05-03 DIAGNOSIS — Z86018 Personal history of other benign neoplasm: Secondary | ICD-10-CM | POA: Insufficient documentation

## 2015-05-03 DIAGNOSIS — Z86718 Personal history of other venous thrombosis and embolism: Secondary | ICD-10-CM | POA: Insufficient documentation

## 2015-05-03 DIAGNOSIS — Z87442 Personal history of urinary calculi: Secondary | ICD-10-CM | POA: Insufficient documentation

## 2015-05-03 HISTORY — DX: Other pulmonary embolism without acute cor pulmonale: I26.99

## 2015-05-03 HISTORY — DX: Pneumonia, unspecified organism: J18.9

## 2015-05-03 HISTORY — DX: Acute embolism and thrombosis of unspecified deep veins of unspecified lower extremity: I82.409

## 2015-05-03 LAB — CBC
HCT: 39.5 % (ref 39.0–52.0)
Hemoglobin: 12.8 g/dL — ABNORMAL LOW (ref 13.0–17.0)
MCH: 28.2 pg (ref 26.0–34.0)
MCHC: 32.4 g/dL (ref 30.0–36.0)
MCV: 87 fL (ref 78.0–100.0)
Platelets: 322 10*3/uL (ref 150–400)
RBC: 4.54 MIL/uL (ref 4.22–5.81)
RDW: 17.4 % — ABNORMAL HIGH (ref 11.5–15.5)
WBC: 5.6 10*3/uL (ref 4.0–10.5)

## 2015-05-03 LAB — HYPERCOAGULABLE PANEL, COMPREHENSIVE
AntiThromb III Func: 133 % activity — ABNORMAL HIGH (ref 80–120)
Anticardiolipin IgA: <11 [APL'U]
Anticardiolipin IgG: <14 [GPL'U]
Anticardiolipin IgM: <12 [MPL'U]
Beta-2 Glyco I IgG: <9 SGU (ref <=20)
Beta-2-Glycoprotein I IgA: 9 SAU (ref <=20)
Beta-2-Glycoprotein I IgM: <9 SMU (ref <=20)
PROTEIN S ANTIGEN, TOTAL: 115 % (ref 70–140)
Protein C Activity: >200 % — ABNORMAL HIGH (ref 70–180)
Protein C Antigen: 146 % — ABNORMAL HIGH (ref 70–140)
Protein S Activity: 82 % (ref 70–150)

## 2015-05-03 LAB — BASIC METABOLIC PANEL
Anion gap: 8 (ref 5–15)
BUN: 15 mg/dL (ref 6–20)
CO2: 20 mmol/L — ABNORMAL LOW (ref 22–32)
Calcium: 9.3 mg/dL (ref 8.9–10.3)
Chloride: 116 mmol/L — ABNORMAL HIGH (ref 101–111)
Creatinine, Ser: 1.31 mg/dL — ABNORMAL HIGH (ref 0.61–1.24)
GFR calc Af Amer: >60 mL/min (ref >60)
GFR calc non Af Amer: >60 mL/min (ref >60)
Glucose, Bld: 103 mg/dL — ABNORMAL HIGH (ref 65–99)
Potassium: 3.9 mmol/L (ref 3.5–5.1)
Sodium: 144 mmol/L (ref 135–145)

## 2015-05-03 LAB — I-STAT TROPONIN, ED: Troponin i, poc: 0 ng/mL (ref 0.00–0.08)

## 2015-05-03 MED ORDER — OXYCODONE-ACETAMINOPHEN 5-325 MG PO TABS: 1.0000 | ORAL_TABLET | ORAL | Status: DC | PRN

## 2015-05-03 NOTE — ED Notes (Addendum)
Pt c/o slight central chest discomfort w/ breathing.  Reports that he was diagnosed w/ PNA and a PE yesterday.  Sts he was started on Eloquis, Z pack, and prednisone.  Per chart review, Pt was diagnosed w/ an URI and small PE in R lower lobe.  Pt reports previous LLE PE.

## 2015-05-03 NOTE — ED Notes (Signed)
Patient presents for centralized CP and SOB. Recently diagnosed with pneumonia PE and DVT and started on eloquis. Describes pain as shooting, and also c/o wheezing. Denies N/V, diaphoresis, lightheadedness or dizziness.

## 2015-05-03 NOTE — Telephone Encounter (Signed)
Pt called in he was seen yesterday by Dr Judi Cong.  He has some serious concerns about how the clot got from his leg to lungs.  His would like to talk to nurse asap    Best number [phone]

## 2015-05-03 NOTE — Discharge Instructions (Signed)
You have been seen today for shortness of breath. Your imaging and lab tests showed no abnormalities today. Follow up with PCP as needed. Return to ED should symptoms worsen.

## 2015-05-03 NOTE — Telephone Encounter (Signed)
Pt has signficant anxiety with recent dx.  This question was answered at his OV.  Not sure what else to say except the blood clot that formed in the leg went to the lungs through the vein system.  They were small, and as long as he takes the eliquis, there is very little chance for this to get worse, or happen again.  The clots normally heal and go away in 4-6 weeks.  There is no need for further testing or other signficant changes in his lifestyle, including riding in cars or planes (except for standing to stretch every hour or so).  The planned treatment for the eliquis is 6 months, then stop.

## 2015-05-03 NOTE — Telephone Encounter (Signed)
Left message on machine for pt to return my call  

## 2015-05-03 NOTE — ED Notes (Signed)
Patient transported to X-ray 

## 2015-05-03 NOTE — ED Provider Notes (Signed)
CSN: [id number]     Arrival date & time 05/03/15  1247 History   First MD Initiated Contact with Patient 05/03/15 1459     Chief Complaint  Patient presents with  . Shortness of Breath  . Chest Pain     (Consider location/radiation/quality/duration/timing/severity/associated sxs/prior Treatment) HPI   Anthony [name] is a 49 y.o. male, with a history of known DVT and PE as well as asthma, presenting to the ED with exertional shortness of breath for the last 3 days. Pt also complains of chest pain in his left chest, "shooting," rated 6/10, nonradiating. Pt has a known DVT and a known PE in the lower right lung, diagnosed at [facility] yesterday. Pt was placed on Eliquis. Pt had eye surgery in September, but nothing more recent than that. Patient denies diaphoresis, nausea/vomiting, fever/chills, shortness of breath at rest, or any other complaints.  Past Medical History  Diagnosis Date  . Asthma   . Hyperlipidemia   . H/O renal calculi 2006  . Spondylosis   . Fatty liver   . Internal hemorrhoid   . Tubular adenoma of colon 2011    Dr Olevia Perches  . Cataract   . PE (pulmonary embolism)   . DVT (deep venous thrombosis) ([place])   . Pneumonia    Past Surgical History  Procedure Laterality Date  . Cystoscopy w/ stone manipulation  2006  . Shoulder surgery  12/17/2010    Dr French Ana ; shoulder impingement & torn tendon  . Inguinal hernia repair  1990  . Colonoscopy with polypectomy  2011    Dr Olevia Perches, hemorrhoids  . Cataract extraction  1996    Dr Elonda Husky; OS  . Eye surgery Left 1996    cataract surgery   Family History  Problem Relation Age of Onset  . Heart attack Mother 34  . Stroke Father 33  . Diabetes Father     PVD  . Glaucoma Father     blindness  . Cancer Paternal Grandmother     ? primary  . Heart attack Maternal Uncle      X2,pre 45  . Asthma Neg Hx   . COPD Neg Hx   . Heart attack Maternal Grandmother 73   Social History  Substance Use Topics  .  Smoking status: Never Smoker   . Smokeless tobacco: None  . Alcohol Use: Yes     Comment: Socially , < 2X/ month    Review of Systems  Respiratory: Positive for shortness of breath.   Cardiovascular: Positive for chest pain.  All other systems reviewed and are negative.     Allergies  Review of patient's allergies indicates no known allergies.  Home Medications   Prior to Admission medications   Medication Sig Start Date End Date Taking? Authorizing Provider  acetaZOLAMIDE (DIAMOX) 250 MG tablet Take 250 mg by mouth 2 (two) times daily.    Yes Historical Provider, MD  albuterol (PROVENTIL HFA;VENTOLIN HFA) 108 (90 Base) MCG/ACT inhaler Inhale 2 puffs into the lungs every 6 (six) hours as needed. For shortness of breath 05/02/15  Yes Biagio Borg, MD  apixaban (ELIQUIS) 5 MG TABS tablet Take 1 tablet (5 mg total) by mouth 2 (two) times daily. 05/02/15  Yes Biagio Borg, MD  azithromycin (ZITHROMAX Z-PAK) 250 MG tablet Use as directed 05/02/15  Yes Biagio Borg, MD  busPIRone (BUSPAR) 10 MG tablet Take 1 tablet (10 mg total) by mouth 3 (three) times daily. 03/22/15  Yes [name],  MD  COMBIGAN 0.2-0.5 % ophthalmic solution Place 1 drop into the left eye 2 (two) times daily. 03/22/15  Yes Historical Provider, MD  dorzolamide (TRUSOPT) 2 % ophthalmic solution Place 1 drop into the left eye 2 (two) times daily. 04/17/15  Yes Historical Provider, MD  mupirocin ointment (BACTROBAN) 2 % App to skin irritation tid prn 03/09/15  Yes [name], MD  pantoprazole (PROTONIX) 40 MG tablet Take 1 tablet (40 mg total) by mouth daily. Patient taking differently: Take 40 mg by mouth 2 (two) times daily.  05/02/15  Yes Biagio Borg, MD  predniSONE (DELTASONE) 10 MG tablet 3 tabs by mouth per day for 3 days,2tabs per day for 3 days,1 tab per day for 3 days 05/02/15  Yes Biagio Borg, MD  [facility] 160-4.5 MCG/ACT inhaler  10/26/14  Yes Historical Provider, MD  valACYclovir (VALTREX) 500 MG  tablet Take 1 tablet (500 mg total) by mouth daily. 03/09/15  Yes [name], MD  Bromfenac Sodium (PROLENSA) 0.07 % SOLN as directed. 11/19/14   Historical Provider, MD  FLUoxetine (PROZAC) 20 MG tablet Take 1 tablet (20 mg total) by mouth daily. Patient not taking: Reported on 05/03/2015 03/09/15   [name], MD   BP 128/91 mmHg  Pulse 66  Temp(Src) 98.4 F (36.9 C) (Oral)  Resp 13  SpO2 100% Physical Exam  Constitutional: He appears well-developed and well-nourished. No distress.  HENT:  Head: Normocephalic and atraumatic.  Eyes: Conjunctivae are normal. Pupils are equal, round, and reactive to light.  Neck: Normal range of motion. Neck supple.  Cardiovascular: Normal rate, regular rhythm and normal heart sounds.   Pulmonary/Chest: Effort normal and breath sounds normal. No respiratory distress.  Patient shows no increased work of breathing and speaks in full paragraphs.   Abdominal: Soft. Bowel sounds are normal.  Musculoskeletal: He exhibits no edema or tenderness.  Lymphadenopathy:    He has no cervical adenopathy.  Neurological: He is alert.  Skin: Skin is warm and dry. He is not diaphoretic.  Nursing note and vitals reviewed.   ED Course  Procedures (including critical care time) Labs Review Labs Reviewed  BASIC METABOLIC PANEL - Abnormal; Notable for the following:    Chloride 116 (*)    CO2 20 (*)    Glucose, Bld 103 (*)    Creatinine, Ser 1.31 (*)    All other components within normal limits  CBC - Abnormal; Notable for the following:    Hemoglobin 12.8 (*)    RDW 17.4 (*)    All other components within normal limits  I-STAT TROPOININ, ED    Imaging Review Dg Chest 2 View  05/03/2015  CLINICAL DATA:  On meds for pneumonia and clot in lung, pt having sob all this week, hx of dvt, and asthma., Nonsmoker, Blood clot started in leg and now in Lung according to pt EXAM: CHEST  2 VIEW COMPARISON:  CT 05/02/2015.  Plain film 06/03/2012. FINDINGS: Midline  trachea.  Normal heart size and mediastinal contours. Sharp costophrenic angles.  No pneumothorax.  Clear lungs. IMPRESSION: No active cardiopulmonary disease. Electronically Signed   By: Abigail Miyamoto M.D.   On: 05/03/2015 13:39   Ct Angio Chest Pe W/cm &/or Wo Cm  05/02/2015  CLINICAL DATA:  Worsening shortness of breath. Elevated D-dimer. Recently diagnosed with a left lower extremity DVT. EXAM: CT ANGIOGRAPHY CHEST WITH CONTRAST TECHNIQUE: Multidetector CT imaging of the chest was performed using the standard protocol during bolus administration of intravenous contrast. Multiplanar  CT image reconstructions and MIPs were obtained to evaluate the vascular anatomy. CONTRAST:  2mL OMNIPAQUE IOHEXOL 350 MG/ML SOLN COMPARISON:  None. FINDINGS: Mediastinum/Nodes: No chest wall mass, supraclavicular or axillary lymphadenopathy. The thyroid gland appears normal. The heart is normal in size.  No pericardial effusion. The aorta is normal in caliber. No dissection. The branch vessels are patent. The pulmonary arterial tree is well opacified. Small filling defects noted and third and fourth order right lower lobe pulmonary arteries. No other definite pulmonary emboli. Lungs/Pleura: Patchy ground-glass opacity in the lungs may reflect mild pulmonary edema or an inflammatory process such as alveolitis. No findings for pulmonary infarction, pleural effusion or atelectasis. Upper abdomen: No significant upper abdominal findings. Musculoskeletal: No significant bony findings. Review of the MIP images confirms the above findings. IMPRESSION: Small peripheral right lower lobe pulmonary emboli. Patchy ground-glass opacity in the lungs is likely mild edema or inflammation/alveolitis. No pulmonary infarction or pleural effusion. Normal heart and aorta. Electronically Signed   By: Marijo Sanes M.D.   On: 05/02/2015 13:49   I have personally reviewed and evaluated these images and lab results as part of my medical  decision-making.   EKG Interpretation   Date/Time:  Friday May 03 2015 12:55:42 EST Ventricular Rate:  79 PR Interval:  167 QRS Duration: 80 QT Interval:  342 QTC Calculation: 392 R Axis:   35 Text Interpretation:  Sinus rhythm No previous ECGs available Confirmed by  YAO  MD, DAVID ([id number]) on 05/03/2015 3:55:21 PM      MDM   Final diagnoses:  Shortness of breath  Other acute pulmonary embolism without acute cor pulmonale (HCC)    Hubbard Robinson presents for exertional shortness of breath and chest pain the last 3 days associated with a known PE. Findings and plan of care discussed with Wandra Arthurs, MD. Dr. Darl Householder personally evaluated this patient as well.   This patient is in no apparent distress, is nontoxic appearing, is not tachypneic or tachycardic, is maintaining a SPO2 100% on room air, and has a normal physical exam. Patient was reassured that his symptoms are to be expected with a PE. Patient states that he just got scared. Patient has had no increase in symptoms since they started 3 days ago. Patient fits no criteria for admission or further imaging beyond chest x-ray, which was clear today. The patient was given instructions for home care as well as return precautions. Patient voices understanding of these instructions, accepts the plan, and is comfortable with discharge. Patient stated multiple times that he understood information given to him and that he also understood that he was always welcome to return to the ED should he have any further concerns or change in symptoms.   Filed Vitals:   05/03/15 1301 05/03/15 1630  BP: 114/96 128/91  Pulse: 81 66  Temp: 97.5 F (36.4 C) 98.4 F (36.9 C)  TempSrc: Oral Oral  Resp: 15 13  SpO2: 100% 100%     Lorayne Bender, PA-C 05/03/15 Galien Yao, MD 05/03/15 2325

## 2015-05-03 NOTE — Telephone Encounter (Signed)
Pt seen in ED regarding same on 05/03/2015

## 2015-05-07 ENCOUNTER — Encounter: Payer: Self-pay | Admitting: Family

## 2015-05-08 ENCOUNTER — Telehealth: Payer: Self-pay | Admitting: Internal Medicine

## 2015-05-08 NOTE — Telephone Encounter (Signed)
Pt was diagnosed with blood clots in his lungs last week. He is now suffering from a dry cough. He has an appointment with Marya Amsler tomorrow to transfer from [place].  We are unable to get him in today. Should he go back to ER or can this wait till tomorrow. Please advise

## 2015-05-08 NOTE — Telephone Encounter (Signed)
Per greg calone, patient advised that as long as he is taking eloquis daily, no concern with blood clot ---dry cough is prob due to slight pneumonia he was diagnosed with at ED on Friday---patient has completed Zpac antibiotic and has 3 days remaining on steroid---patient advised he can get note for work when he sees greg tomorrow 1/5 if needed

## 2015-05-09 ENCOUNTER — Encounter: Payer: Self-pay | Admitting: Family

## 2015-05-09 ENCOUNTER — Ambulatory Visit (INDEPENDENT_AMBULATORY_CARE_PROVIDER_SITE_OTHER): Payer: Federal, State, Local not specified - PPO | Admitting: Family

## 2015-05-09 VITALS — BP 128/84 | HR 88 | Temp 98.0°F | Resp 18 | Ht 66.0 in | Wt 210.0 lb

## 2015-05-09 DIAGNOSIS — K279 Peptic ulcer, site unspecified, unspecified as acute or chronic, without hemorrhage or perforation: Secondary | ICD-10-CM | POA: Insufficient documentation

## 2015-05-09 DIAGNOSIS — K269 Duodenal ulcer, unspecified as acute or chronic, without hemorrhage or perforation: Secondary | ICD-10-CM

## 2015-05-09 DIAGNOSIS — J069 Acute upper respiratory infection, unspecified: Secondary | ICD-10-CM | POA: Diagnosis not present

## 2015-05-09 DIAGNOSIS — I2699 Other pulmonary embolism without acute cor pulmonale: Secondary | ICD-10-CM | POA: Diagnosis not present

## 2015-05-09 MED ORDER — BENZONATATE 100 MG PO CAPS: 100.0000 mg | ORAL_CAPSULE | Freq: Two times a day (BID) | ORAL | Status: DC | PRN

## 2015-05-09 NOTE — Assessment & Plan Note (Signed)
Cough most likely relate to resolving upper respiratory infection. Start tessalon and Scotts as needed for cough. Continue over the counter medications as needed for symptom relief and supportive care. Follow up if symptoms worsen or do not improve.

## 2015-05-09 NOTE — Patient Instructions (Addendum)
Thank you for choosing [facility].  Summary/Instructions:  Your prescription(s) have been submitted to your pharmacy or been printed and provided for you. Please take as directed and contact our office if you believe you are having problem(s) with the medication(s) or have any questions.  If your symptoms worsen or fail to improve, please contact our office for further instruction, or in case of emergency go directly to the emergency room at the closest medical facility.   Please continue to take your medication as prescribed.    Pulmonary Embolism A pulmonary embolism (PE) is a sudden blockage or decrease of blood flow in one lung or both lungs. Most blockages come from a blood clot that travels from the legs or the pelvis to the lungs. PE is a dangerous and potentially life-threatening condition if it is not treated right away. CAUSES A pulmonary embolism occurs most commonly when a blood clot travels from one of your veins to your lungs. Rarely, PE is caused by air, fat, amniotic fluid, or part of a tumor traveling through your veins to your lungs. RISK FACTORS A PE is more likely to develop in:  People who smoke.  People who areolder, especially over 60 years of age.  People who are overweight (obese).  People who sit or lie still for a long time, such as during long-distance travel (over 4 hours), bed rest, hospitalization, or during recovery from certain medical conditions like a stroke.  People who do not engage in much physical activity (sedentary lifestyle).  People who have chronic breathing disorders.  People whohave a personal or family history of blood clots or blood clotting disease.  People whohave peripheral vascular disease (PVD), diabetes, or some types of cancer.  People who haveheart disease, especially if the person had a recent heart attack or has congestive heart failure.  People who have neurological diseases that affect the legs (leg  paresis).  People who have had a traumatic injury, such as breaking a hip or leg.  People whohave recently had major or lengthy surgery, especially on the hip, knee, or abdomen.  People who have hada central line placed inside a large vein.  People who takemedicines that contain the hormone estrogen. These include birth control pills and hormone replacement therapy.  Pregnancy or during childbirth or the postpartum period. SIGNS AND SYMPTOMS  The symptoms of a PE usually start suddenly and include:  Shortness of breath while active or at rest.  Coughing or coughing up blood or blood-tinged mucus.  Chest pain that is often worse with deep breaths.  Rapid or irregular heartbeat.  Feeling light-headed or dizzy.  Fainting.  Feelinganxious.  Sweating. There may also be pain and swelling in a leg if that is where the blood clot started. These symptoms may represent a serious problem that is an emergency. Do not wait to see if the symptoms will go away. Get medical help right away. Call your local emergency services (911 in the [place] Do not drive yourself to the hospital. DIAGNOSIS Your health care provider will take a medical history and perform a physical exam. You may also have other tests, including:  Blood tests to assess the clotting properties of your blood, assess oxygen levels in your blood, and find blood clots.  Imaging tests, such as CT, ultrasound, MRI, X-ray, and other tests to see if you have clots anywhere in your body.  An electrocardiogram (ECG) to look for heart strain from blood clots in the lungs. TREATMENT The main goals  of PE treatment are:  To stop a blood clot from growing larger.  To stop new blood clots from forming. The type of treatment that you receive depends on many factors, such as the cause of your PE, your risk for bleeding or developing more clots, and other medical conditions that you have. Sometimes, a combination of treatments is  necessary. This condition may be treated with:  Medicines, including newer oral blood thinners (anticoagulants), warfarin, low molecular weight heparins, thrombolytics, or heparins.  Wearing compression stockings or using different types of devices.  Surgery (rare) to remove the blood clot or to place a filter in your abdomen to stop the blood clot from traveling to your lungs. Treatments for a PE are often divided into immediate treatment, long-term treatment (up to 3 months after PE), and extended treatment (more than 3 months after PE). Your treatment may continue for several months. This is called maintenance therapy, and it is used to prevent the forming of new blood clots. You can work with your health care provider to choose the treatment program that is best for you. What are anticoagulants? Anticoagulants are medicines that treat PEs. They can stop current blood clots from growing and stop new clots from forming. They cannot dissolve existing clots. Your body dissolves clots by itself over time. Anticoagulants are given by mouth, by injection, or through an IV tube. What are thrombolytics? Thrombolytics are clot-dissolving medicines that are used to dissolve a PE. They carry a high risk of bleeding, so they tend to be used only in severe cases or if you have very low blood pressure. HOME CARE INSTRUCTIONS If you are taking a newer oral anticoagulant:  Take the medicine every single day at the same time each day.  Understand what foods and drugs interact with this medicine.  Understand that there are no regular blood tests required when using this medicine.  Understandthe side effects of this medicine, including excessive bruising or bleeding. Ask your health care provider or pharmacist about other possible side effects. If you are taking warfarin:  Understand how to take warfarin and know which foods can affect how warfarin works in Veterinary surgeon.  Understand that it is dangerous  to taketoo much or too little warfarin. Too much warfarin increases the risk of bleeding. Too little warfarin continues to allow the risk for blood clots.  Follow your PT and INR blood testing schedule. The PT and INR results allow your health care provider to adjust your dose of warfarin. It is very important that you have your PT and INR tested as often as told by your health care provider.  Avoid major changes in your diet, or tell your health care provider before you change your diet. Arrange a visit with a registered dietitian to answer your questions. Many foods, especially foods that are high in vitamin K, can interfere with warfarin and affect the PT and INR results. Eat a consistent amount of foods that are high in vitamin K, such as:  Spinach, kale, broccoli, cabbage, collard greens, turnip greens, Brussels sprouts, peas, cauliflower, seaweed, and parsley.  Beef liver and pork liver.  Green tea.  Soybean oil.  Tell your health care provider about any and all medicines, vitamins, and supplements that you take, including aspirin and other over-the-counter anti-inflammatory medicines. Be especially cautious with aspirin and anti-inflammatory medicines. Do not take those before you ask your health care provider if it is safe to do so. This is important because many medicines can interfere  with warfarin and affect the PT and INR results.  Do not start or stop taking any over-the-counter or prescription medicine unless your health care provider or pharmacist tells you to do so. If you take warfarin, you will also need to do these things:  Hold pressure over cuts for longer than usual.  Tell your dentist and other health care providers that you are taking warfarin before you have any procedures in which bleeding may occur.  Avoid alcohol or drink very small amounts. Tell your health care provider if you change your alcohol intake.  Do not use tobacco products, including cigarettes,  chewing tobacco, and e-cigarettes. If you need help quitting, ask your health care provider.  Avoid contact sports. General Instructions  Take over-the-counter and prescription medicines only as told by your health care provider. Anticoagulant medicines can have side effects, including easy bruising and difficulty stopping bleeding. If you are prescribed an anticoagulant, you will also need to do these things:  Hold pressure over cuts for longer than usual.  Tell your dentist and other health care providers that you are taking anticoagulants before you have any procedures in which bleeding may occur.  Avoid contact sports.  Wear a medical alert bracelet or carry a medical alert card that says you have had a PE.  Ask your health care provider how soon you can go back to your normal activities. Stay active to prevent new blood clots from forming.  Make sure to exercise while traveling or when you have been sitting or standing for a long period of time. It is very important to exercise. Exercise your legs by walking or by tightening and relaxing your leg muscles often. Take frequent walks.  Wear compression stockings as told by your health care provider to help prevent more blood clots from forming.  Do not use tobacco products, including cigarettes, chewing tobacco, and e-cigarettes. If you need help quitting, ask your health care provider.  Keep all follow-up appointments with your health care provider. This is important. PREVENTION Take these actions to decrease your risk of developing another PE:  Exercise regularly. For at least 30 minutes every day, engage in:  Activity that involves moving your arms and legs.  Activity that encourages good blood flow through your body by increasing your heart rate.  Exercise your arms and legs every hour during long-distance travel (over 4 hours). Drink plenty of water and avoid drinking alcohol while traveling.  Avoid sitting or lying in bed  for long periods of time without moving your legs.  Maintain a weight that is appropriate for your height. Ask your health care provider what weight is healthy for you.  If you are a woman who is over 40 years of age, avoid unnecessary use of medicines that contain estrogen. These include birth control pills.  Do not smoke, especially if you take estrogen medicines. If you need help quitting, ask your health care provider.  If you are at very high risk for PE, wear compression stockings.  If you recently had a PE, have regularly scheduled ultrasound testing on your legs to check for new blood clots. If you are hospitalized, prevention measures may include:  Early walking after surgery, as soon as your health care provider says that it is safe.  Receiving anticoagulants to prevent blood clots. If you cannot take anticoagulants, other options may be available, such as wearing compression stockings or using different types of devices. SEEK IMMEDIATE MEDICAL CARE IF:  You have new or increased  pain, swelling, or redness in an arm or leg.  You have numbness or tingling in an arm or leg.  You have shortness of breath while active or at rest.  You have chest pain.  You have a rapid or irregular heartbeat.  You feel light-headed or dizzy.  You cough up blood.  You notice blood in your vomit, bowel movement, or urine.  You have a fever. These symptoms may represent a serious problem that is an emergency. Do not wait to see if the symptoms will go away. Get medical help right away. Call your local emergency services (911 in the [place] Do not drive yourself to the hospital.   This information is not intended to replace advice given to you by your health care provider. Make sure you discuss any questions you have with your health care provider.   Document Released: 04/17/2000 Document Revised: 01/09/2015 Document Reviewed: 08/15/2014 Elsevier Interactive Patient Education [employer].

## 2015-05-09 NOTE — Assessment & Plan Note (Signed)
No current symptoms and maintained on pantoprazole. Continue to monitor for bleeding given anticoagulation with Eliquis. Continue current dosage of pantoprazole. Follow-up if symptoms worsen.

## 2015-05-09 NOTE — Assessment & Plan Note (Signed)
Symptoms of cough and shortness of breath possibly related to recent pulmonary embolism although possibly still recovering from upper respiratory infection.  Currently stable and vital signs are stable. Anticoagulated with Eliquis. No nucisance or uncontrolled bleeding. Continue current dosage of Eliquis. Recommend no strenuous activity at this time. Follow up for uncontrolled bleeding or worsening of symptoms

## 2015-05-09 NOTE — Progress Notes (Signed)
Pre visit review using our clinic review tool, if applicable. No additional management support is needed unless otherwise documented below in the visit note. 

## 2015-05-09 NOTE — Progress Notes (Signed)
Subjective:    Patient ID: Anthony [name], male    DOB: 1965/09/30, 50 y.o.   MRN: [id number]  Chief Complaint  Patient presents with  . Establish Care    was in the ER friday for chest pain and SOB, has a cough that started monday night, was told by dr. Jenny Reichmann that he has pneumonia     HPI:  Anthony [name] is a 50 y.o. male who  has a past medical history of Asthma; Hyperlipidemia; H/O renal calculi (2006); Spondylosis; Fatty liver; Internal hemorrhoid; Tubular adenoma of colon (2011); Cataract; PE (pulmonary embolism); DVT (deep venous thrombosis) ([place]); and Pneumonia. and presents today for a follow up office visit.  Recently evaluated in the office and found to have a DVT in his lower leg that progressed to 2 small pulmonary embolisms. He is currently taking Eliquis for anticoagulation. He is experiencing the associated symptoms cough and shortness of breath with new onset light headedness. He was diagnosed with an upper respiratory infection on 12/29 and treated with azithromycin and a prednisone taper. He completed the course of azithromycin and has 2 more days of the prednisone. He also has been using the albuterol inhaler which he indicates does not help with the cough. Denies fevers. Denies chest pain or calf pain. No nuisance bleeding.   No Known Allergies   Current Outpatient Prescriptions on File Prior to Visit  Medication Sig Dispense Refill  . acetaZOLAMIDE (DIAMOX) 250 MG tablet Take 250 mg by mouth 2 (two) times daily.     Marland Kitchen albuterol (PROVENTIL HFA;VENTOLIN HFA) 108 (90 Base) MCG/ACT inhaler Inhale 2 puffs into the lungs every 6 (six) hours as needed. For shortness of breath 1 Inhaler 5  . apixaban (ELIQUIS) 5 MG TABS tablet Take 1 tablet (5 mg total) by mouth 2 (two) times daily. 60 tablet 5  . azithromycin (ZITHROMAX Z-PAK) 250 MG tablet Use as directed 6 tablet 1  . Bromfenac Sodium (PROLENSA) 0.07 % SOLN as directed.    . busPIRone (BUSPAR) 10 MG tablet Take 1 tablet  (10 mg total) by mouth 3 (three) times daily. 90 tablet 1  . COMBIGAN 0.2-0.5 % ophthalmic solution Place 1 drop into the left eye 2 (two) times daily.  8  . dorzolamide (TRUSOPT) 2 % ophthalmic solution Place 1 drop into the left eye 2 (two) times daily.  3  . FLUoxetine (PROZAC) 20 MG tablet Take 1 tablet (20 mg total) by mouth daily. 30 tablet 3  . mupirocin ointment (BACTROBAN) 2 % App to skin irritation tid prn 22 g 5  . oxyCODONE-acetaminophen (PERCOCET) 5-325 MG tablet Take 1-2 tablets by mouth every 4 (four) hours as needed. 10 tablet 0  . pantoprazole (PROTONIX) 40 MG tablet Take 1 tablet (40 mg total) by mouth daily. (Patient taking differently: Take 40 mg by mouth 2 (two) times daily. ) 90 tablet 3  . predniSONE (DELTASONE) 10 MG tablet 3 tabs by mouth per day for 3 days,2tabs per day for 3 days,1 tab per day for 3 days 18 tablet 0  . SYMBICORT 160-4.5 MCG/ACT inhaler     . valACYclovir (VALTREX) 500 MG tablet Take 1 tablet (500 mg total) by mouth daily. 90 tablet 3   No current facility-administered medications on file prior to visit.     Past Surgical History  Procedure Laterality Date  . Cystoscopy w/ stone manipulation  2006  . Shoulder surgery  12/17/2010    Dr French Ana ; shoulder impingement & torn  tendon  . Inguinal hernia repair  1990  . Colonoscopy with polypectomy  2011    Dr Olevia Perches, hemorrhoids  . Cataract extraction  1996    Dr Elonda Husky; OS  . Eye surgery Left 1996    cataract surgery    Review of Systems  Constitutional: Negative for fever and chills.  HENT: Positive for sneezing.   Respiratory: Positive for cough. Negative for chest tightness and shortness of breath.   Cardiovascular: Negative for chest pain, palpitations and leg swelling.  Neurological: Positive for light-headedness.      Objective:    BP 128/84 mmHg  Pulse 88  Temp(Src) 98 F (36.7 C) (Oral)  Resp 18  Ht 5\' 6"  (1.676 m)  Wt 210 lb (95.255 kg)  BMI 33.91 kg/m2  SpO2 98% Nursing  note and vital signs reviewed.  Physical Exam  Constitutional: He is oriented to person, place, and time. He appears well-developed and well-nourished. No distress.  HENT:  Right Ear: Hearing, tympanic membrane, external ear and ear canal normal.  Left Ear: Hearing, tympanic membrane, external ear and ear canal normal.  Nose: Nose normal. Right sinus exhibits no maxillary sinus tenderness and no frontal sinus tenderness. Left sinus exhibits no maxillary sinus tenderness and no frontal sinus tenderness.  Mouth/Throat: Uvula is midline, oropharynx is clear and moist and mucous membranes are normal.  Cardiovascular: Normal rate, regular rhythm, normal heart sounds and intact distal pulses.   Pulmonary/Chest: Effort normal and breath sounds normal. No respiratory distress. He has no wheezes. He has no rales. He exhibits no tenderness.  Abdominal: Soft. Bowel sounds are normal. He exhibits no distension and no mass. There is no tenderness. There is no rebound and no guarding.  Neurological: He is alert and oriented to person, place, and time.  Skin: Skin is warm and dry.  Psychiatric: He has a normal mood and affect. His behavior is normal. Judgment and thought content normal.       Assessment & Plan:   Problem List Items Addressed This Visit      Cardiovascular and Mediastinum   Acute pulmonary embolism (HCC) - Primary    Symptoms of cough and shortness of breath possibly related to recent pulmonary embolism although possibly still recovering from upper respiratory infection.  Currently stable and vital signs are stable. Anticoagulated with Eliquis. No nucisance or uncontrolled bleeding. Continue current dosage of Eliquis. Recommend no strenuous activity at this time. Follow up for uncontrolled bleeding or worsening of symptoms         Respiratory   Acute upper respiratory infection    Cough most likely relate to resolving upper respiratory infection. Start tessalon and [place] as needed for  cough. Continue over the counter medications as needed for symptom relief and supportive care. Follow up if symptoms worsen or do not improve.       Relevant Medications   benzonatate (TESSALON) 100 MG capsule     Digestive   Duodenal ulcer    No current symptoms and maintained on pantoprazole. Continue to monitor for bleeding given anticoagulation with Eliquis. Continue current dosage of pantoprazole. Follow-up if symptoms worsen.

## 2015-05-10 ENCOUNTER — Telehealth: Payer: Self-pay | Admitting: Family

## 2015-05-10 NOTE — Telephone Encounter (Signed)
Patient called in to advise that he was walking up his steps today, and began experiencing pulling in both of his calves, just like when he was originally DX with blood clots.

## 2015-05-13 NOTE — Telephone Encounter (Signed)
Based on the fact that he is on Eliquis this significantly reduces the risk of additional blood clots and only further monitoring is needed at this time.

## 2015-05-23 ENCOUNTER — Ambulatory Visit (INDEPENDENT_AMBULATORY_CARE_PROVIDER_SITE_OTHER): Payer: Federal, State, Local not specified - PPO | Admitting: Family Medicine

## 2015-05-23 ENCOUNTER — Ambulatory Visit (INDEPENDENT_AMBULATORY_CARE_PROVIDER_SITE_OTHER): Payer: Federal, State, Local not specified - PPO

## 2015-05-23 VITALS — BP 110/80 | HR 62 | Temp 97.9°F | Resp 18 | Ht 68.0 in | Wt 213.4 lb

## 2015-05-23 DIAGNOSIS — R0602 Shortness of breath: Secondary | ICD-10-CM

## 2015-05-23 DIAGNOSIS — R079 Chest pain, unspecified: Secondary | ICD-10-CM | POA: Diagnosis not present

## 2015-05-23 DIAGNOSIS — Z86711 Personal history of pulmonary embolism: Secondary | ICD-10-CM

## 2015-05-23 DIAGNOSIS — Z8711 Personal history of peptic ulcer disease: Secondary | ICD-10-CM

## 2015-05-23 DIAGNOSIS — Z7901 Long term (current) use of anticoagulants: Secondary | ICD-10-CM | POA: Diagnosis not present

## 2015-05-23 LAB — POCT CBC
Granulocyte percent: 47.8 %G (ref 37–80)
HCT, POC: 40.3 % — AB (ref 43.5–53.7)
Hemoglobin: 12.8 g/dL — AB (ref 14.1–18.1)
Lymph, poc: 2.1 (ref 0.6–3.4)
MCH, POC: 27.1 pg (ref 27–31.2)
MCHC: 31.8 g/dL (ref 31.8–35.4)
MCV: 85 fL (ref 80–97)
MID (cbc): 0.3 (ref 0–0.9)
MPV: 6.6 fL (ref 0–99.8)
POC Granulocyte: 2.2 (ref 2–6.9)
POC LYMPH PERCENT: 44.9 %L (ref 10–50)
POC MID %: 7.3 %M (ref 0–12)
Platelet Count, POC: 263 10*3/uL (ref 142–424)
RBC: 4.73 M/uL (ref 4.69–6.13)
RDW, POC: 16.8 %
WBC: 4.7 10*3/uL (ref 4.6–10.2)

## 2015-05-23 LAB — D-DIMER, QUANTITATIVE: D-Dimer, Quant: 0.27 ug/mL-FEU (ref 0.00–0.48)

## 2015-05-23 NOTE — Patient Instructions (Signed)
We will check the blood test for blood clots, and if it is elevated I will send you for another scan.  If you are acutely worse at anytime straight to the emergency room. If you continue having the right upper abdomen and right low chest pain over the next couple of weeks please return and we will assess further for possible gallbladder disease or other etiology.  Continue taking your blood thinner medication. You are a little bit anemic, and it would probably be good to take an over-the-counter iron pill 1 daily to rebuild your iron

## 2015-05-23 NOTE — Progress Notes (Signed)
Patient ID: Anthony [name], male    DOB: [birth year]  Age: 50 y.o. MRN: [id number]  Chief Complaint  Patient presents with  . Other    Sharp pain right side, chest pain, today morning  . Shortness of Breath  . Back Pain    lower back side    Subjective:   This morning the patient developed sharp pain in the right lower chest. He was at work at the post office. It Bothering him after he got off work this morning, and he came on in this afternoon. He has felt short of breath. He had pulmonary emboli about 3 weeks ago, is on anticoagulant therapy. He is very concerned about this. He has been having some low back pain but that has been a little bit different from this. Several years ago he had some right upper quadrant and right low chest pain that he was worked up with a ultrasound of the abdomen and no gallbladder lesions or stones were noted.  Current allergies, medications, problem list, past/family and social histories reviewed.  Objective:  BP 110/80 mmHg  Pulse 62  Temp(Src) 97.9 F (36.6 C) (Oral)  Resp 18  Ht 5\' 8"  (1.727 m)  Wt 213 lb 6.4 oz (96.798 kg)  BMI 32.46 kg/m2  SpO2 98%  He does not appear short of breath. He does appear anxious. Throat clear. Neck supple. Chest clear. Heart regular without murmurs. No rhonchi or rales or wheezes or rubs. Abdomen soft with mild right upper quadrant tenderness. It is very lateral tenderness.  Assessment & Plan:   Assessment: 1. Right-sided chest pain   2. Shortness of breath   3. History of pulmonary embolism   4. Long term current use of anticoagulant therapy   5. History of peptic ulcer disease       Plan: Will check a d-dimer and call if it is positive. EKG normal  [facility] reading (PRIMARY) by  Dr. Linna Darner Normal chest  Results for orders placed or performed in visit on 05/23/15  POCT CBC  Result Value Ref Range   WBC 4.7 4.6 - 10.2 K/uL   Lymph, poc 2.1 0.6 - 3.4   POC LYMPH PERCENT 44.9 10 - 50 %L   MID (cbc) 0.3 0  - 0.9   POC MID % 7.3 0 - 12 %M   POC Granulocyte 2.2 2 - 6.9   Granulocyte percent 47.8 37 - 80 %G   RBC 4.73 4.69 - 6.13 M/uL   Hemoglobin 12.8 (A) 14.1 - 18.1 g/dL   HCT, POC 40.3 (A) 43.5 - 53.7 %   MCV 85.0 80 - 97 fL   MCH, POC 27.1 27 - 31.2 pg   MCHC 31.8 31.8 - 35.4 g/dL   RDW, POC 16.8 %   Platelet Count, POC 263 142 - 424 K/uL   MPV 6.6 0 - 99.8 fL   .   Orders Placed This Encounter  Procedures  . DG Chest 2 View    Order Specific Question:  Reason for Exam (SYMPTOM  OR DIAGNOSIS REQUIRED)    Answer:  Chest pain right, recent pul emboli 3 weeks ago    Order Specific Question:  Preferred imaging location?    Answer:  External  . D-dimer, quantitative (not at [facility])  . POCT CBC  . EKG 12-Lead      Patient Instructions  We will check the blood test for blood clots, and if it is elevated I will send you for another scan.  If you are acutely worse at anytime straight to the emergency room. If you continue having the right upper abdomen and right low chest pain over the next couple of weeks please return and we will assess further for possible gallbladder disease or other etiology.  Continue taking your blood thinner medication. You are a little bit anemic, and it would probably be good to take an over-the-counter iron pill 1 daily to rebuild your iron       Return in about 3 months (around 08/21/2015).   Astoria Condon, MD 05/23/2015

## 2015-05-25 ENCOUNTER — Telehealth: Payer: Self-pay | Admitting: Family Medicine

## 2015-05-25 NOTE — Telephone Encounter (Signed)
Per Dr. Linna Darner give it a couple/few more days, if he is not any better, or still having pain Monday or Tuesday return to office for recheck. Patient notified and voiced understanding.

## 2015-05-25 NOTE — Telephone Encounter (Signed)
Received a call from the answering service regarding patient. Returned patient call and he is still having the pain he was having when he came in Thursday. He has been taking Tylenol, like advised, and that is not working. The pain has not changed, its still the same, not better or worse.

## 2015-05-26 NOTE — Telephone Encounter (Signed)
Noted  

## 2015-06-11 ENCOUNTER — Encounter: Payer: Self-pay | Admitting: Family

## 2015-06-11 ENCOUNTER — Ambulatory Visit (INDEPENDENT_AMBULATORY_CARE_PROVIDER_SITE_OTHER): Payer: Federal, State, Local not specified - PPO | Admitting: Family

## 2015-06-11 VITALS — BP 112/84 | HR 88 | Temp 97.7°F | Resp 18 | Ht 68.0 in | Wt 215.0 lb

## 2015-06-11 DIAGNOSIS — K269 Duodenal ulcer, unspecified as acute or chronic, without hemorrhage or perforation: Secondary | ICD-10-CM

## 2015-06-11 DIAGNOSIS — I824Z2 Acute embolism and thrombosis of unspecified deep veins of left distal lower extremity: Secondary | ICD-10-CM

## 2015-06-11 NOTE — Assessment & Plan Note (Addendum)
Stable and currently anticoagulated with Eliquis with no adverse effects. Discussed continuation of anticoagulation at this time and will schedule follow up dopplers pending completion of course of therapy and follow up with pulmonology. Consider hypercoagulability panel upon completion of therapy. Continue current dosage of Eliquis.

## 2015-06-11 NOTE — Patient Instructions (Signed)
Thank you for choosing [facility].  Summary/Instructions:  Please continue to take the Eliquis as prescribed.  [facility].  If your symptoms worsen or fail to improve, please contact our office for further instruction, or in case of emergency go directly to the emergency room at the closest medical facility.

## 2015-06-11 NOTE — Progress Notes (Signed)
Subjective:    Patient ID: Anthony [name], male    DOB: [birth year], 50 y.o.   MRN: [id number]  Chief Complaint  Patient presents with  . Follow-up    HPI:  Anthony [name] is a 50 y.o. male who  has a past medical history of Asthma; Hyperlipidemia; H/O renal calculi (2006); Spondylosis; Fatty liver; Internal hemorrhoid; Tubular adenoma of colon (2011); Cataract; PE (pulmonary embolism); DVT (deep venous thrombosis) ([place]); and Pneumonia. and presents today for a follow up.   1.) Stress at work - Previously seen in [employer] for [place] and a colonoscopy and endoscopy found that he had a 1 cm clean based ulcer as well as duodenitis and internal hemorrhoids. Denies any current abdominal pain, burning sensation, or blood in his stool. Describes his stools as slightly loose and may be related to what he ate. Follow up with [facility] on 2/9.   Continues to express a stressful work environment where he is being accused of Education officer, museum and notes that he had an encounter last night with the same individual that caused him to walk out for 30 minutes. The other individual was made to clock out and go home early. He did call the [place] hotline and discussed the situation with a counselor. He has a follow up appointment with a counselor on 2/8.   2.) DVT - Currently anticoagulated with Eliquis. Reports taking the medication as prescribed and denies adverse side effects or nuisance bleeding. Previous tingling symptoms have gradually improved.    No Known Allergies   Current Outpatient Prescriptions on File Prior to Visit  Medication Sig Dispense Refill  . acetaZOLAMIDE (DIAMOX) 250 MG tablet Take 250 mg by mouth 2 (two) times daily.     Marland Kitchen albuterol (PROVENTIL HFA;VENTOLIN HFA) 108 (90 Base) MCG/ACT inhaler Inhale 2 puffs into the lungs every 6 (six) hours as needed. For shortness of breath 1 Inhaler 5  . apixaban (ELIQUIS) 5 MG TABS tablet Take 1 tablet (5 mg total) by mouth 2 (two)  times daily. 60 tablet 5  . Bromfenac Sodium (PROLENSA) 0.07 % SOLN as directed.    . busPIRone (BUSPAR) 10 MG tablet Take 1 tablet (10 mg total) by mouth 3 (three) times daily. 90 tablet 1  . COMBIGAN 0.2-0.5 % ophthalmic solution Place 1 drop into the left eye 2 (two) times daily.  8  . dorzolamide (TRUSOPT) 2 % ophthalmic solution Place 1 drop into the left eye 2 (two) times daily.  3  . FLUoxetine (PROZAC) 20 MG tablet Take 1 tablet (20 mg total) by mouth daily. 30 tablet 3  . mupirocin ointment (BACTROBAN) 2 % App to skin irritation tid prn 22 g 5  . oxyCODONE-acetaminophen (PERCOCET) 5-325 MG tablet Take 1-2 tablets by mouth every 4 (four) hours as needed. 10 tablet 0  . pantoprazole (PROTONIX) 40 MG tablet Take 1 tablet (40 mg total) by mouth daily. (Patient taking differently: Take 40 mg by mouth 2 (two) times daily. ) 90 tablet 3  . SYMBICORT 160-4.5 MCG/ACT inhaler     . valACYclovir (VALTREX) 500 MG tablet Take 1 tablet (500 mg total) by mouth daily. 90 tablet 3   No current facility-administered medications on file prior to visit.     Past Surgical History  Procedure Laterality Date  . Cystoscopy w/ stone manipulation  2006  . Shoulder surgery  12/17/2010    Dr French Ana ; shoulder impingement & torn tendon  . Inguinal hernia repair  1990  .  Colonoscopy with polypectomy  2011    Dr Olevia Perches, hemorrhoids  . Cataract extraction  1996    Dr Elonda Husky; OS  . Eye surgery Left 1996    cataract surgery    Review of Systems  Constitutional: Negative for fever and chills.  Gastrointestinal: Negative for nausea, vomiting, abdominal pain, constipation and blood in stool.  Psychiatric/Behavioral: The patient is nervous/anxious.       Objective:    BP 112/84 mmHg  Pulse 88  Temp(Src) 97.7 F (36.5 C) (Oral)  Resp 18  Ht 5\' 8"  (1.727 m)  Wt 215 lb (97.523 kg)  BMI 32.70 kg/m2  SpO2 96% Nursing note and vital signs reviewed.  Physical Exam  Constitutional: He is oriented to  person, place, and time. He appears well-developed and well-nourished. No distress.  Cardiovascular: Normal rate, regular rhythm, normal heart sounds and intact distal pulses.   Pulmonary/Chest: Effort normal and breath sounds normal.  Abdominal: Soft. He exhibits no distension and no mass. There is no tenderness. There is no rebound and no guarding.  Neurological: He is alert and oriented to person, place, and time.  Skin: Skin is warm and dry.  Psychiatric: He has a normal mood and affect. His behavior is normal. Judgment and thought content normal.       Assessment & Plan:   Problem List Items Addressed This Visit      Cardiovascular and Mediastinum   DVT (deep venous thrombosis) ([place])    Stable and currently anticoagulated with Eliquis with no adverse effects. Discussed continuation of anticoagulation at this time and will schedule follow up dopplers pending completion of course of therapy and follow up with pulmonology. Consider hypercoagulability panel upon completion of therapy. Continue current dosage of Eliquis.         Digestive   Duodenal ulcer - Primary    Currently stable and maintained on pantoprazole. Denies current symptoms. Per previous notes ulcer is most likely multifactorial but appears related to use of NASIDs and Goody's powders. Continue current dosage of protonix and follow up with gastroenterology as scheduled.          Total Encounter time of 35 minutes of face to face time with greater than 20 minutes counseling and educating regarding anticoagulation following DVT and causes of duodenal ulcers that are most likely multifactorial and not from one specific cause.

## 2015-06-11 NOTE — Progress Notes (Signed)
Pre visit review using our clinic review tool, if applicable. No additional management support is needed unless otherwise documented below in the visit note. 

## 2015-06-11 NOTE — Assessment & Plan Note (Signed)
Currently stable and maintained on pantoprazole. Denies current symptoms. Per previous notes ulcer is most likely multifactorial but appears related to use of NASIDs and Goody's powders. Continue current dosage of protonix and follow up with gastroenterology as scheduled.

## 2015-06-13 ENCOUNTER — Encounter: Payer: Self-pay | Admitting: Family

## 2015-06-13 ENCOUNTER — Ambulatory Visit (INDEPENDENT_AMBULATORY_CARE_PROVIDER_SITE_OTHER): Payer: Federal, State, Local not specified - PPO | Admitting: Gastroenterology

## 2015-06-13 ENCOUNTER — Encounter: Payer: Self-pay | Admitting: Gastroenterology

## 2015-06-13 VITALS — BP 104/80 | HR 88 | Ht 66.75 in | Wt 213.2 lb

## 2015-06-13 DIAGNOSIS — K648 Other hemorrhoids: Secondary | ICD-10-CM

## 2015-06-13 DIAGNOSIS — Z86010 Personal history of colonic polyps: Secondary | ICD-10-CM | POA: Diagnosis not present

## 2015-06-13 DIAGNOSIS — K264 Chronic or unspecified duodenal ulcer with hemorrhage: Secondary | ICD-10-CM | POA: Diagnosis not present

## 2015-06-13 NOTE — Patient Instructions (Addendum)
Please follow up as needed.  Please stop taking the Protonix.  You will be due for a recall colonoscopy in 2027. We will send you a reminder in the mail when it gets closer to that time.  Thank you for choosing [facility]  Dr Wilfrid Lund III

## 2015-06-13 NOTE — Progress Notes (Signed)
Patient ID: ZAHID ARY, male   DOB: 1965/05/15, 50 y.o.   MRN: [id number] Gastroenterology and Hepatology Consult Note:  History: Anthony [name] 06/13/2015  Referring physician: Mauricio Po, [place]  Reason for consult/chief complaint: duodenal ulcer   Subjective HPI:  This is a 50 year old man referred to me to discuss a duodenal ulcer. He last saw Dr. Olevia Perches for a colonoscopy in 2011, where a diminutive polyp and internal hemorrhoids were found. He has not been back to the clinic since then. Extensive outside records (50 pages) were reviewed from a hospital admission at [facility] on 04/03/2015. He presented with abdominal pain and melena, and on EGD ( Dr. Shary Key) was found to have a 1 cm clean based duodenal ulcer thought to be from aspirin and NSAIDs. He reports stopping all those meds since then. He had a colonoscopy at that time the just showed internal hemorrhoids with no polyps. He was just discharged after 2 or 3 days and told to follow up with our clinic. He has no more abdominal pain no melena bright red blood per rectum early satiety vomiting dysphagia or weight loss.   ROS:  Review of Systems Positive for anxiety back pain and depression. Meter systems are negative except as above  Past Medical History: Past Medical History  Diagnosis Date  . Asthma   . Hyperlipidemia   . H/O renal calculi 2006  . Spondylosis   . Fatty liver   . Internal hemorrhoid   . Tubular adenoma of colon 2011    Dr Olevia Perches  . Cataract   . PE (pulmonary embolism)   . DVT (deep venous thrombosis) ([place])   . Pneumonia   . Anemia   . Anxiety   . Peptic ulcer disease   . Depression      Past Surgical History: Past Surgical History  Procedure Laterality Date  . Cystoscopy w/ stone manipulation  2006  . Shoulder surgery Right 12/17/2010    Dr French Ana ; shoulder impingement & torn tendon  . Inguinal hernia repair Right 1990  . Colonoscopy with polypectomy  2011    Dr Olevia Perches, hemorrhoids  .  Cataract extraction Left 1996    Dr Elonda Husky; OS     Family History: Family History  Problem Relation Age of Onset  . Heart attack Mother 51  . Stroke Father 14  . Diabetes Father     PVD  . Glaucoma Father     blindness  . Cancer Paternal Grandmother     ? primary  . Heart attack Maternal Uncle      X2,pre 49  . Asthma Neg Hx   . COPD Neg Hx   . Heart attack Maternal Grandmother 49    Social History: Social History   Social History  . Marital Status: Divorced    Spouse Name: N/A  . Number of Children: 4  . Years of Education: N/A   Social History Main Topics  . Smoking status: Never Smoker   . Smokeless tobacco: Never Used  . Alcohol Use: 0.0 oz/week    0 Standard drinks or equivalent per week     Comment: Socially , < 2X/ month  . Drug Use: No  . Sexual Activity: Not Asked   Other Topics Concern  . None   Social History Narrative   works for the post office  Allergies: No Known Allergies  Outpatient Meds: Current Outpatient Prescriptions  Medication Sig Dispense Refill  . acetaZOLAMIDE (DIAMOX) 250 MG tablet Take 250 mg by mouth  2 (two) times daily.     Marland Kitchen albuterol (PROVENTIL HFA;VENTOLIN HFA) 108 (90 Base) MCG/ACT inhaler Inhale 2 puffs into the lungs every 6 (six) hours as needed. For shortness of breath 1 Inhaler 5  . apixaban (ELIQUIS) 5 MG TABS tablet Take 1 tablet (5 mg total) by mouth 2 (two) times daily. 60 tablet 5  . Bromfenac Sodium (PROLENSA) 0.07 % SOLN as directed.    . busPIRone (BUSPAR) 10 MG tablet Take 1 tablet (10 mg total) by mouth 3 (three) times daily. 90 tablet 1  . COMBIGAN 0.2-0.5 % ophthalmic solution Place 1 drop into the left eye 2 (two) times daily.  8  . dorzolamide (TRUSOPT) 2 % ophthalmic solution Place 1 drop into the left eye 2 (two) times daily.  3  . FLUoxetine (PROZAC) 20 MG tablet Take 1 tablet (20 mg total) by mouth daily. 30 tablet 3  . mupirocin ointment (BACTROBAN) 2 % App to skin irritation tid prn 22 g 5  .  oxyCODONE-acetaminophen (PERCOCET) 5-325 MG tablet Take 1-2 tablets by mouth every 4 (four) hours as needed. 10 tablet 0  . SYMBICORT 160-4.5 MCG/ACT inhaler     . valACYclovir (VALTREX) 500 MG tablet Take 1 tablet (500 mg total) by mouth daily. 90 tablet 3   No current facility-administered medications for this visit.      ___________________________________________________________________ Objective  Exam:  BP 104/80 mmHg  Pulse 88  Ht 5' 6.75" (1.695 m)  Wt 96.73 kg (213 lb 4 oz)  BMI 33.67 kg/m2  General: this is a well-appearing overweight middle-aged black male patient in no acute distress  Eyes: sclera anicteric, no redness  ENT: oral mucosa moist without lesions, no cervical or supraclavicular lymphadenopathy, good dentition  CV: RRR without murmur, S1/S2, no JVD,, no peripheral edema  Resp: clear to auscultation bilaterally, normal RR and effort noted  GI: soft, no tenderness, with active bowel sounds. No guarding or palpable organomegaly noted  Skin; warm and dry, no rash or jaundice noted  Neuro: awake, alert and oriented x 3. Normal gross motor function and fluent speech  Labs:  Hemoglobin 12.1 on 04/03/2015 Gastric biopsy negative for H. Pylori  Radiologic Studies:  Assessment: Encounter Diagnoses  Name Primary?  . Duodenal ulcer with hemorrhage but without obstruction Yes  . Internal hemorrhoids   . 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 Since he has stopped those, and is H. pylori negative, his ulcer will have healed by now. He has no ongoing symptoms suggestive of ulcer. Plan: Discontinue PPI Recall colonoscopy for screening in 10 years Avoid aspirin and NSAIDs.   Thank you for the courtesy of this consult.  Please call me with any questions or concerns.  Nelida Meuse III

## 2015-07-23 ENCOUNTER — Telehealth: Payer: Self-pay | Admitting: Family

## 2015-07-23 DIAGNOSIS — Z5181 Encounter for therapeutic drug level monitoring: Secondary | ICD-10-CM

## 2015-07-23 NOTE — Telephone Encounter (Signed)
Pt called to check up on the lab work that he suppose to get the lab work q3 month done since he is on Eliquis. Please advise and when is he suppose to come back for a follow up.

## 2015-07-23 NOTE — Telephone Encounter (Signed)
Lab orders placed.  

## 2015-07-23 NOTE — Telephone Encounter (Signed)
Please advise on what lab work is supposed to be done with this medication.

## 2015-07-24 NOTE — Telephone Encounter (Signed)
Patient called. Advised of the below. He still has several questions. Please call the patient back to clarify questions

## 2015-07-24 NOTE — Telephone Encounter (Signed)
Returned call

## 2015-07-24 NOTE — Telephone Encounter (Signed)
LVM letting pt know.  

## 2015-07-26 ENCOUNTER — Other Ambulatory Visit (INDEPENDENT_AMBULATORY_CARE_PROVIDER_SITE_OTHER): Payer: Federal, State, Local not specified - PPO

## 2015-07-26 DIAGNOSIS — Z5181 Encounter for therapeutic drug level monitoring: Secondary | ICD-10-CM

## 2015-07-26 LAB — BASIC METABOLIC PANEL
BUN: 9 mg/dL (ref 6–23)
CO2: 28 mEq/L (ref 19–32)
Calcium: 9.3 mg/dL (ref 8.4–10.5)
Chloride: 104 mEq/L (ref 96–112)
Creatinine, Ser: 0.96 mg/dL (ref 0.40–1.50)
GFR: 107.29 mL/min (ref >60.00)
Glucose, Bld: 95 mg/dL (ref 70–99)
Potassium: 4.1 mEq/L (ref 3.5–5.1)
Sodium: 139 mEq/L (ref 135–145)

## 2015-07-26 LAB — HEPATIC FUNCTION PANEL
ALT: 28 U/L (ref 0–53)
AST: 22 U/L (ref 0–37)
Albumin: 4.2 g/dL (ref 3.5–5.2)
Alkaline Phosphatase: 77 U/L (ref 39–117)
Bilirubin, Direct: 0.1 mg/dL (ref 0.0–0.3)
Total Bilirubin: 0.4 mg/dL (ref 0.2–1.2)
Total Protein: 7.5 g/dL (ref 6.0–8.3)

## 2015-07-27 ENCOUNTER — Encounter: Payer: Self-pay | Admitting: Family

## 2015-07-30 ENCOUNTER — Encounter: Payer: Self-pay | Admitting: Internal Medicine

## 2015-07-30 ENCOUNTER — Ambulatory Visit (INDEPENDENT_AMBULATORY_CARE_PROVIDER_SITE_OTHER): Payer: Federal, State, Local not specified - PPO | Admitting: Internal Medicine

## 2015-07-30 VITALS — BP 120/70 | HR 77 | Ht 67.0 in | Wt 215.0 lb

## 2015-07-30 DIAGNOSIS — J309 Allergic rhinitis, unspecified: Secondary | ICD-10-CM | POA: Diagnosis not present

## 2015-07-30 DIAGNOSIS — I2782 Chronic pulmonary embolism: Secondary | ICD-10-CM

## 2015-07-30 DIAGNOSIS — I2699 Other pulmonary embolism without acute cor pulmonale: Secondary | ICD-10-CM | POA: Diagnosis not present

## 2015-07-30 DIAGNOSIS — J3089 Other allergic rhinitis: Secondary | ICD-10-CM

## 2015-07-30 DIAGNOSIS — J302 Other seasonal allergic rhinitis: Secondary | ICD-10-CM

## 2015-07-30 NOTE — Patient Instructions (Signed)
I agree with plan to continue Eliquis until June  Avoid sitting for long periods without moving- keep your circulation going in your legs.  Ok to resume normal physical activity as tolerated  Order- schedule Echocardiogram    Dx pulmonary embolism  Ok to use Flonase/ fluticasone nasal spray for pollen allergy. It works best if used every day

## 2015-07-30 NOTE — Progress Notes (Signed)
09/25/11- 50 yoM never smoker, referred by Dr. Linna Darner for allergy evaluation. He had hayfever with asthma as a child, sinus infections and seasonal rhinitis with sneezing chest tightness and shortness of breath. While in the [employer] in [place] in the late 1980s, he had a bronchial pneumonia. Subsequent episodes of wheezing and cough were called asthma. He was followed by Dr Caprice Red years ago but those records are no longer available. He was on allergy vaccine or one year. He recently had exacerbation and had to leave work 6 days ago, going to the emergency room for nebulizer treatment with symptoms of an upper respiratory infection. The next day he was seen at an urgent care and treated with prednisone. He has been out of work all week. His usual management includes Symbicort and at least daily use of a rescue inhaler. He is finishing a course of prednisone. Nasal discharge is clear yellow. He had frontal headache early in the week, now resolved. No ENT surgery. No problem with latex, contrast dye, aspirin, specific foods. He has had eczema. 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 Scientist, research (life sciences), getting medications from [facility]. CXR-02/01/09- reviewed- IMPRESSION:  No active lung disease. Question bronchitis.  Provider: Lelon Huh  11/12/11- 50 yoM never smoker followed for allergic rhinitis, asthma Review PFT results with patient; feels like everything has cleared up since last visit and with using Dymista(did not want Rx) Allergy Profile 09/25/2011-total IgE 159.6 with multiple specific elevations especially for grass pollens, molds, tree pollens and ragweed. PFT: 10/08/2011-mild obstructive airways disease-small airways, insignificant response to bronchodilator.  06/03/12- 50 yoM never smoker followed for allergic rhinitis, asthma Follows for : pt states still having some wheezing,sob,with increase activity denies any coughing. Pt stated sample of  dymista seemed to help  Brings FMLA form for completion. Still a lot of tight wheezing most days worse with cold air, sexual activity, exertion. Short of breath with exertion. Not much rhinitis. Still has Symbicort 160. Using Ventolin rescue inhaler occasionally.  02/27/13- 50 yoM never smoker followed for allergic rhinitis, asthma FOLLOWS FOR: had asthma attack since yesterday-was at aunt's house with alot of smoke. Has used albuterol tx's, having SOB and wheezing; hard time speaking, soreness in right side of torso and back. Refuses flu vaccine saying it makes him sick every time-discussed. He visited family member recently where people were smoking. Since then increased sneeze and cough for 2 or 3 days. Tussive soreness right lateral rib. Symbicort 160 and rescue inhaler are not enough help. CXR 06/30/12 IMPRESSION:  No focal infiltrates are present. Mild peribronchial thickening  may indicate bronchitis. Similar appearance to priors.  Original Report Authenticated By: Rolla Flatten, M.D.  08/31/13- 50 yoM never smoker followed for allergic rhinitis, asthma FOLLOWS FOR:  Increase sob over the past week with some tightness in chest 3 or 4 days of increasing shortness of breath and increased use of his rescue inhaler. Does not recognize infection. Nasal airway clear with no sneezing. 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 He was diagnosed with asthma and bronchopneumonia at that time. He says he had no asthma before that pneumonia and he denies family history of asthma. He requests a letter supporting his argument that asthma developed as a consequence of pneumonia which occurred while he was in [employer].  07/30/2015-50 year old male never smoker followed for allergic rhinitis, asthma, complicated by  history [id number], peptic ulcer,  depression (VAH), glaucoma FOLLOWS FOR: Pt was  having breathing issues when appt was made; had blood clots in Dec 2016-calf and lungs.  Pt would like to speak with CY about medications for blood clots. Pt has had nasal congestion as well. Hypercoag panel 04/26/15- incr Protein C,/ managed by Elna Breslow, FNP- Eliquis Reports difficult past several months beginning with need for eye surgery, then bleeding peptic ulcer, then pulmonary embolism after 17 Hour car drive from [place], then hospitalized for major depression which will be treated at [place].  Some seasonal rhinitis with nasal stuffiness and postnasal drip. We discussed indications for continuing or stopping his anticoagulant the end of 6 months. He had helped. Sooner. No bleeding since ulcer healed. Some dyspnea with moderate exertion, or hurrying upstairs etc. No chest pain or significant leg discomfort. CXR 05/23/2015-NAD  ROS-see HPI Constitutional:   No-   weight loss, night sweats, fevers, chills, fatigue, lassitude. HEENT:   No-  headaches, difficulty swallowing, tooth/dental problems, sore throat,    no-sneezing, no-itching, ear ache, no- nasal congestion, post nasal drip,  CV:  No--chest pain, No- orthopnea, PND, swelling in lower extremities, anasarca, dizziness, palpitations Resp: +shortness of breath with exertion or at rest.              No-  productive cough,  No non-productive cough,  No- coughing up of blood.                change in color of mucus.  No- wheezing.   Skin:  GI:  No-   heartburn, indigestion, abdominal pain, nausea, vomiting, GU:, MS:  No-   joint pain or swelling.   Neuro-     nothing unusual Psych:  No- change in mood or affect. No depression or anxiety.  No memory loss.  OBJ- Physical Exam General- Alert, Oriented, Affect-appropriate, Distress- none acute Skin- + mild hyperkeratosis on anterior chest Lymphadenopathy- none Head- atraumatic            Eyes- Gross vision intact, PERRLA, conjunctivae and secretions clear            Ears- Hearing,  canals-normal            Nose-no-sniffing, no-Septal dev, polyps, erosion, perforation             Throat- Mallampati III , mucosa clear , drainage- none, tonsils- atrophic Neck- flexible , trachea midline, no stridor , thyroid nl, carotid no bruit Chest - symmetrical excursion , unlabored           Heart/CV- RRR , no murmur , no gallop  , no rub, nl s1 s2                           - JVD- none , edema- none, stasis changes- none, varices- none           Lung- clear to P&A, wheeze-none, cough- none , dullness-none, rub- none           Chest wall- unlabored, clear, wheeze-none, cough-none  Abd-  Br/ Gen/ Rectal- Not done, not indicated Extrem- cyanosis- none, clubbing, none, atrophy- none, strength- nl Neuro- grossly intact to observation

## 2015-07-30 NOTE — Assessment & Plan Note (Signed)
Discussed basic controls for seasonal exacerbation Plan-maintenance use of OTC nasal steroid through pollen season, supplemented by antihistamine as needed

## 2015-07-30 NOTE — Assessment & Plan Note (Signed)
Elevated Protein C on hypercoagulable profile but otherwise no special risk identified. The trigger was his 17 hour duration ride in car. We discussed avoidance of stasis. He feels well enough to resume normal activities. Plan-echocardiogram looking for residual evidence of right heart strain I would support stopping Eliquis after 6 months (June).

## 2015-08-06 DIAGNOSIS — Z5181 Encounter for therapeutic drug level monitoring: Secondary | ICD-10-CM | POA: Diagnosis not present

## 2015-08-06 DIAGNOSIS — Z7901 Long term (current) use of anticoagulants: Secondary | ICD-10-CM | POA: Diagnosis not present

## 2015-08-06 DIAGNOSIS — Z86718 Personal history of other venous thrombosis and embolism: Secondary | ICD-10-CM | POA: Diagnosis not present

## 2015-08-09 ENCOUNTER — Ambulatory Visit (INDEPENDENT_AMBULATORY_CARE_PROVIDER_SITE_OTHER): Payer: Federal, State, Local not specified - PPO | Admitting: Emergency Medicine

## 2015-08-09 VITALS — BP 120/78 | HR 84 | Temp 97.7°F | Resp 17 | Ht 67.0 in | Wt 216.4 lb

## 2015-08-09 DIAGNOSIS — R319 Hematuria, unspecified: Secondary | ICD-10-CM | POA: Diagnosis not present

## 2015-08-09 DIAGNOSIS — H524 Presbyopia: Secondary | ICD-10-CM | POA: Diagnosis not present

## 2015-08-09 DIAGNOSIS — R21 Rash and other nonspecific skin eruption: Secondary | ICD-10-CM | POA: Diagnosis not present

## 2015-08-09 DIAGNOSIS — H04123 Dry eye syndrome of bilateral lacrimal glands: Secondary | ICD-10-CM | POA: Diagnosis not present

## 2015-08-09 DIAGNOSIS — B009 Herpesviral infection, unspecified: Secondary | ICD-10-CM

## 2015-08-09 LAB — POCT CBC
Granulocyte percent: 77.3 %G (ref 37–80)
HCT, POC: 42.8 % — AB (ref 43.5–53.7)
Hemoglobin: 14.7 g/dL (ref 14.1–18.1)
Lymph, poc: 1.6 (ref 0.6–3.4)
MCH, POC: 27.4 pg (ref 27–31.2)
MCHC: 34.2 g/dL (ref 31.8–35.4)
MCV: 80 fL (ref 80–97)
MID (cbc): 0.3 (ref 0–0.9)
MPV: 6.5 fL (ref 0–99.8)
POC Granulocyte: 6.4 (ref 2–6.9)
POC LYMPH PERCENT: 19.5 %L (ref 10–50)
POC MID %: 3.2 %M (ref 0–12)
Platelet Count, POC: 264 10*3/uL (ref 142–424)
RBC: 5.36 M/uL (ref 4.69–6.13)
RDW, POC: 15.7 %
WBC: 8.3 10*3/uL (ref 4.6–10.2)

## 2015-08-09 LAB — POCT URINALYSIS DIP (MANUAL ENTRY)
Bilirubin, UA: NEGATIVE
Blood, UA: NEGATIVE
Glucose, UA: NEGATIVE
Ketones, POC UA: NEGATIVE
Leukocytes, UA: NEGATIVE
Nitrite, UA: NEGATIVE
Protein Ur, POC: NEGATIVE
Spec Grav, UA: 1.02
Urobilinogen, UA: 0.2
pH, UA: 7

## 2015-08-09 LAB — POC MICROSCOPIC URINALYSIS (UMFC): Mucus: ABSENT

## 2015-08-09 LAB — HIV ANTIBODY (ROUTINE TESTING W REFLEX): HIV 1&2 Ab, 4th Generation: NONREACTIVE

## 2015-08-09 MED ORDER — VALACYCLOVIR HCL 1 G PO TABS
ORAL_TABLET | ORAL | Status: DC
Start: 2015-08-09 — End: 2015-08-14

## 2015-08-09 NOTE — Progress Notes (Signed)
Patient ID: DETAVIOUS BAINBRIDGE, male   DOB: 06-11-1965, 50 y.o.   MRN: [id number] HPI Comments:    By signing my name below, I, Anthony [name], attest that this documentation has been prepared under the direction and in the presence of Anthony Jordan, MD.  Electronically Signed: Verlee [name], Medical Scribe. 08/09/2015. 9:12 AM.  Chief Complaint:  Chief Complaint  Patient presents with  . Hematuria    x 1 week, per patient hx of kidney stones  . Urinary Frequency    x 1 week  . Depression    Screening     HPI: Anthony [name] is a 50 y.o. male with a history of kidney stones, DVT, and PE who reports to [facility] today complaining of worsening dysuria that started last night. Hematuria is an associated symptom of the dysuria and says he felt chills since last night. He took Tylenol last night. Pt has had hematuria in the past. He has been on Eliquis for the past four months due to history of DVT and PE. He says he has been feeling ill for the last 6 months. He does not feel like he has a kidney stone. He was changed to acyclovir 400 mg BID at the [place].  Has pulmonary doctor, and urologist. His prostate was checked last year.  Past Medical History  Diagnosis Date  . Asthma   . Hyperlipidemia   . H/O renal calculi 2006  . Spondylosis   . Fatty liver   . Internal hemorrhoid   . Tubular adenoma of colon 2011    Dr Olevia Perches  . Cataract   . PE (pulmonary embolism)   . DVT (deep venous thrombosis) ([place])   . Pneumonia   . Anemia   . Anxiety   . Peptic ulcer disease   . Depression   . Kidney stones    Past Surgical History  Procedure Laterality Date  . Cystoscopy w/ stone manipulation  2006  . Shoulder surgery Right 12/17/2010    Dr French Ana ; shoulder impingement & torn tendon  . Inguinal hernia repair Right 1990  . Colonoscopy with polypectomy  2011    Dr Olevia Perches, hemorrhoids  . Cataract extraction Left 1996    Dr Elonda Husky; [facility]   Social History   Social History  . Marital Status: Divorced   Spouse Name: N/A  . Number of Children: 4  . Years of Education: N/A   Social History Main Topics  . Smoking status: Never Smoker   . Smokeless tobacco: Never Used  . Alcohol Use: 0.0 oz/week    0 Standard drinks or equivalent per week     Comment: Socially , < 2X/ month  . Drug Use: No  . Sexual Activity: Not Asked   Other Topics Concern  . None   Social History Narrative   Family History  Problem Relation Age of Onset  . Heart attack Mother 25  . Stroke Father 43  . Diabetes Father     PVD  . Glaucoma Father     blindness  . Cancer Paternal Grandmother     ? primary  . Heart attack Maternal Uncle      X2,pre 24  . Asthma Neg Hx   . COPD Neg Hx   . Heart attack Maternal Grandmother 73   No Known Allergies Prior to Admission medications   Medication Sig Start Date End Date Taking? Authorizing Provider  albuterol (PROVENTIL HFA;VENTOLIN HFA) 108 (90 Base) MCG/ACT inhaler Inhale 2 puffs into the lungs every  6 (six) hours as needed. For shortness of breath 05/02/15  Yes Biagio Borg, MD  apixaban (ELIQUIS) 5 MG TABS tablet Take 1 tablet (5 mg total) by mouth 2 (two) times daily. 05/02/15  Yes Biagio Borg, MD  Bromfenac Sodium (PROLENSA) 0.07 % SOLN as directed. 11/19/14  Yes Historical Provider, MD  FLUoxetine (PROZAC) 20 MG tablet Take 1 tablet (20 mg total) by mouth daily. 03/09/15  Yes [name], MD  mupirocin ointment Drue Stager) 2 % App to skin irritation tid prn 03/09/15  Yes [name], MD  oxyCODONE-acetaminophen (PERCOCET) 5-325 MG tablet Take 1-2 tablets by mouth every 4 (four) hours as needed. 05/03/15  Yes Wandra Arthurs, MD  SIMBRINZA 1-0.2 % SUSP  07/17/15  Yes Historical Provider, MD  [facility] 160-4.5 MCG/ACT inhaler  10/26/14  Yes Historical Provider, MD  timolol (TIMOPTIC-XR) 0.5 % ophthalmic gel-forming  07/17/15  Yes Historical Provider, MD  valACYclovir (VALTREX) 500 MG tablet Take 1 tablet (500 mg total) by mouth daily. 03/09/15  Yes [name],  MD     ROS: The patient denies fevers, night sweats, unintentional weight loss, chest pain, palpitations, wheezing, dyspnea on exertion, nausea, vomiting, abdominal pain, melena, numbness, weakness, or tingling. Positive for chills.   All other systems have been reviewed and were otherwise negative with the exception of those mentioned in the HPI and as above.    PHYSICAL EXAM: Filed Vitals:   08/09/15 0856  BP: 120/78  Pulse: 84  Temp: 97.7 F (36.5 C)  Resp: 17   Body mass index is 33.88 kg/(m^2).   General: Alert, no acute distress HEENT:  Normocephalic, atraumatic, oropharynx patent. Eye: Juliette Mangle [facility] Cardiovascular:  Regular rate and rhythm, no rubs murmurs or gallops.  No Carotid bruits, radial pulse intact. No pedal edema.  Respiratory: Clear to auscultation bilaterally.  No wheezes, rales, or rhonchi.  No cyanosis, no use of accessory musculature Abdominal: No organomegaly, abdomen is soft and non-tender, positive bowel sounds.  No masses. Musculoskeletal: Gait intact. No edema, tenderness Skin: atopic eczema, cluster of vesicles in the right groin, suspicious for herpes.  Genitourinary: Genital is normal and prostate is normal. No discharge. Neurologic: Facial musculature symmetric. Psychiatric: Patient acts appropriately throughout our interaction. Lymphatic: No cervical or submandibular lymphadenopathy     LABS: Results for orders placed or performed in visit on 08/09/15  POCT urinalysis dipstick  Result Value Ref Range   Color, UA yellow yellow   Clarity, UA cloudy (A) clear   Glucose, UA negative negative   Bilirubin, UA negative negative   Ketones, POC UA negative negative   Spec Grav, UA 1.020    Blood, UA negative negative   pH, UA 7.0    Protein Ur, POC negative negative   Urobilinogen, UA 0.2    Nitrite, UA Negative Negative   Leukocytes, UA Negative Negative  POCT Microscopic Urinalysis ([facility])  Result Value Ref Range   WBC,UR,HPF,POC Few (A)  None WBC/hpf   RBC,UR,HPF,POC None None RBC/hpf   Bacteria Many (A) None, Too numerous to count   Mucus Absent Absent   Epithelial Cells, UR Per Microscopy None None, Too numerous to count cells/hpf  POCT CBC  Result Value Ref Range   WBC 8.3 4.6 - 10.2 K/uL   Lymph, poc 1.6 0.6 - 3.4   POC LYMPH PERCENT 19.5 10 - 50 %L   MID (cbc) 0.3 0 - 0.9   POC MID % 3.2 0 - 12 %M   POC Granulocyte 6.4 2 -  6.9   Granulocyte percent 77.3 37 - 80 %G   RBC 5.36 4.69 - 6.13 M/uL   Hemoglobin 14.7 14.1 - 18.1 g/dL   HCT, POC 42.8 (A) 43.5 - 53.7 %   MCV 80.0 80 - 97 fL   MCH, POC 27.4 27 - 31.2 pg   MCHC 34.2 31.8 - 35.4 g/dL   RDW, POC 15.7 %   Platelet Count, POC 264 142 - 424 K/uL   MPV 6.5 0 - 99.8 fL     EKG/XRAY:   Primary read interpreted by Dr. Everlene Farrier at [facility].   ASSESSMENT/PLAN: Blood count is normal urine is normal I suspect this is a flare of his herpes. He does have a blister type rash in the right intertriginous area. Have switched him off of acyclovir are and he will take Valtrex 1 g twice a day for 3 days.I personally performed the services described in this documentation, which was scribed in my presence. The recorded information has been reviewed and is accurate. Gross sideeffects, risk and benefits, and alternatives of medications d/w patient. Patient is aware that all medications have potential sideeffects and we are unable to predict every sideeffect or drug-drug interaction that may occur.  Arlyss Queen MD 08/09/2015 9:11 AM

## 2015-08-09 NOTE — Patient Instructions (Addendum)
Clean the area in your groin 3 times a day with soap and water. Take the Valtrex 1 twice a day for 3 days. Do not take the acyclovir by her while you are taking the Valtrex.    IF you received an x-ray today, you will receive an invoice from [facility]. Please contact [facility] at [phone] with questions or concerns regarding your invoice.   IF you received labwork today, you will receive an invoice from [employer]. Please contact [facility] at [phone] with questions or concerns regarding your invoice.   Our billing staff will not be able to assist you with questions regarding bills from these companies.  You will be contacted with the lab results as soon as they are available. The fastest way to get your results is to activate your My Chart account. Instructions are located on the last page of this paperwork. If you have not heard from [place] regarding the results in 2 weeks, please contact this office.

## 2015-08-10 ENCOUNTER — Telehealth: Payer: Self-pay | Admitting: Emergency Medicine

## 2015-08-10 ENCOUNTER — Other Ambulatory Visit: Payer: Self-pay | Admitting: Emergency Medicine

## 2015-08-10 DIAGNOSIS — R972 Elevated prostate specific antigen [PSA]: Secondary | ICD-10-CM

## 2015-08-10 LAB — PSA: PSA: 16.77 ng/mL — ABNORMAL HIGH (ref <=4.00)

## 2015-08-10 LAB — GC/CHLAMYDIA PROBE AMP
CT Probe RNA: NOT DETECTED
GC Probe RNA: NOT DETECTED

## 2015-08-10 MED ORDER — DOXYCYCLINE HYCLATE 100 MG PO TABS: 100.0000 mg | ORAL_TABLET | Freq: Two times a day (BID) | ORAL | Status: DC

## 2015-08-10 NOTE — Telephone Encounter (Signed)
I tried to call the patient and discuss his elevated PSA. I sent in a prescription for an antibiotic and made a referral to a urologist. I could not reach him or his son on the listed numbers. Please send a letter and ask him to call our office regarding his test results and referral to urology.

## 2015-08-11 LAB — URINE CULTURE: Colony Count: >=100000

## 2015-08-12 ENCOUNTER — Ambulatory Visit (HOSPITAL_COMMUNITY): Payer: Federal, State, Local not specified - PPO | Attending: Cardiovascular Disease

## 2015-08-12 ENCOUNTER — Other Ambulatory Visit: Payer: Self-pay

## 2015-08-12 ENCOUNTER — Other Ambulatory Visit: Payer: Self-pay | Admitting: Emergency Medicine

## 2015-08-12 DIAGNOSIS — I2782 Chronic pulmonary embolism: Secondary | ICD-10-CM | POA: Insufficient documentation

## 2015-08-12 DIAGNOSIS — I2699 Other pulmonary embolism without acute cor pulmonale: Secondary | ICD-10-CM | POA: Diagnosis present

## 2015-08-12 MED ORDER — AMOXICILLIN 875 MG PO TABS: 875.0000 mg | ORAL_TABLET | Freq: Two times a day (BID) | ORAL | Status: DC

## 2015-08-13 LAB — HERPES SIMPLEX VIRUS CULTURE: Organism ID, Bacteria: DETECTED

## 2015-08-14 ENCOUNTER — Ambulatory Visit (INDEPENDENT_AMBULATORY_CARE_PROVIDER_SITE_OTHER): Payer: Federal, State, Local not specified - PPO | Admitting: Emergency Medicine

## 2015-08-14 VITALS — BP 122/78 | HR 82 | Temp 97.9°F | Resp 16 | Ht 67.0 in | Wt 216.4 lb

## 2015-08-14 DIAGNOSIS — B009 Herpesviral infection, unspecified: Secondary | ICD-10-CM | POA: Diagnosis not present

## 2015-08-14 DIAGNOSIS — R319 Hematuria, unspecified: Secondary | ICD-10-CM | POA: Diagnosis not present

## 2015-08-14 DIAGNOSIS — N3001 Acute cystitis with hematuria: Secondary | ICD-10-CM | POA: Diagnosis not present

## 2015-08-14 LAB — POCT URINALYSIS DIP (MANUAL ENTRY)
Bilirubin, UA: NEGATIVE
Glucose, UA: NEGATIVE
Ketones, POC UA: NEGATIVE
Leukocytes, UA: NEGATIVE
Nitrite, UA: NEGATIVE
Protein Ur, POC: NEGATIVE
Spec Grav, UA: 1.025
Urobilinogen, UA: 0.2
pH, UA: 5.5

## 2015-08-14 LAB — POC MICROSCOPIC URINALYSIS (UMFC): Mucus: ABSENT

## 2015-08-14 MED ORDER — VALACYCLOVIR HCL 1 G PO TABS: ORAL_TABLET | ORAL | Status: DC

## 2015-08-14 NOTE — Patient Instructions (Signed)
     IF you received an x-ray today, you will receive an invoice from [facility]. Please contact [facility] at [phone] with questions or concerns regarding your invoice.   IF you received labwork today, you will receive an invoice from [facility]. Please contact [facility] at [phone] with questions or concerns regarding your invoice.   Our billing staff will not be able to assist you with questions regarding bills from these companies.  You will be contacted with the lab results as soon as they are available. The fastest way to get your results is to activate your My Chart account. Instructions are located on the last page of this paperwork. If you have not heard from us regarding the results in 2 weeks, please contact this office.      

## 2015-08-14 NOTE — Progress Notes (Signed)
Patient ID: Anthony [name], male   DOB: [birth year], 50 y.o.   MRN: [id number]    By signing my name below, I, Essence Howell, attest that this documentation has been prepared under the direction and in the presence of Darlyne Russian, MD Electronically Signed: Ladene Artist, ED Scribe 08/14/2015 at 10:27 AM.  Chief Complaint:  Chief Complaint  Patient presents with  . Urinary Tract Infection    only improvement is frequent urination is starting to subside   HPI: Anthony [name] is a 50 y.o. male who reports to [facility] today for a follow-up visit regarding UTI. Pt was seen in the office on 08/09/15 for UTI; positive urine culture for enterococcus and started on amoxicillin 2 days ago. Since last visit, he states that urinary frequency, dysuria, hematuria and chills have improved. He reports decreased water intake since he has been out of work. Pt has a h/o renal calculi. GC/Chlamydia was negative. Pt had a positive herpes culture treated with Valtrex. His PSA was elevated at 17 on 08/09/15. Pt has a follow-up appointment with his urologist's PA tomorrow, 08/15/15.   Past Medical History  Diagnosis Date  . Asthma   . Hyperlipidemia   . H/O renal calculi 2006  . Spondylosis   . Fatty liver   . Internal hemorrhoid   . Tubular adenoma of colon 2011    Dr Olevia Perches  . Cataract   . PE (pulmonary embolism)   . DVT (deep venous thrombosis) ([place])   . Pneumonia   . Anemia   . Anxiety   . Peptic ulcer disease   . Depression   . Kidney stones    Past Surgical History  Procedure Laterality Date  . Cystoscopy w/ stone manipulation  2006  . Shoulder surgery Right 12/17/2010    Dr French Ana ; shoulder impingement & torn tendon  . Inguinal hernia repair Right 1990  . Colonoscopy with polypectomy  2011    Dr Olevia Perches, hemorrhoids  . Cataract extraction Left 1996    Dr Elonda Husky; [facility]   Social History   Social History  . Marital Status: Divorced    Spouse Name: N/A  . Number of Children: 4  . Years of  Education: N/A   Social History Main Topics  . Smoking status: Never Smoker   . Smokeless tobacco: Never Used  . Alcohol Use: 0.0 oz/week    0 Standard drinks or equivalent per week     Comment: Socially , < 2X/ month  . Drug Use: No  . Sexual Activity: Not Asked   Other Topics Concern  . None   Social History Narrative   Family History  Problem Relation Age of Onset  . Heart attack Mother 66  . Stroke Father 42  . Diabetes Father     PVD  . Glaucoma Father     blindness  . Cancer Paternal Grandmother     ? primary  . Heart attack Maternal Uncle      X2,pre 33  . Asthma Neg Hx   . COPD Neg Hx   . Heart attack Maternal Grandmother 73   No Known Allergies Prior to Admission medications   Medication Sig Start Date End Date Taking? Authorizing Provider  albuterol (PROVENTIL HFA;VENTOLIN HFA) 108 (90 Base) MCG/ACT inhaler Inhale 2 puffs into the lungs every 6 (six) hours as needed. For shortness of breath 05/02/15  Yes Biagio Borg, MD  amoxicillin (AMOXIL) 875 MG tablet Take 1 tablet (875 mg total) by mouth 2 (  two) times daily. 08/12/15  Yes Darlyne Russian, MD  apixaban (ELIQUIS) 5 MG TABS tablet Take 1 tablet (5 mg total) by mouth 2 (two) times daily. 05/02/15  Yes Biagio Borg, MD  ARIPiprazole (ABILIFY) 10 MG tablet Take 10 mg by mouth daily.   Yes Historical Provider, MD  Bromfenac Sodium (PROLENSA) 0.07 % SOLN as directed. 11/19/14  Yes Historical Provider, MD  FLUoxetine (PROZAC) 20 MG tablet Take 1 tablet (20 mg total) by mouth daily. 03/09/15  Yes Anthony Haber, MD  mupirocin ointment Drue Stager) 2 % App to skin irritation tid prn 03/09/15  Yes Anthony Haber, MD  oxyCODONE-acetaminophen (PERCOCET) 5-325 MG tablet Take 1-2 tablets by mouth every 4 (four) hours as needed. 05/03/15  Yes Wandra Arthurs, MD  PARoxetine (PAXIL) 10 MG tablet Take 10 mg by mouth daily.   Yes Historical Provider, MD  SIMBRINZA 1-0.2 % SUSP  07/17/15  Yes Historical Provider, MD  [facility] 160-4.5  MCG/ACT inhaler  10/26/14  Yes Historical Provider, MD  timolol (TIMOPTIC-XR) 0.5 % ophthalmic gel-forming  07/17/15  Yes Historical Provider, MD   ROS: The patient denies fevers, chills, night sweats, unintentional weight loss, chest pain, palpitations, wheezing, dyspnea on exertion, nausea, vomiting, abdominal pain, melena, numbness, weakness, or tingling.   All other systems have been reviewed and were otherwise negative with the exception of those mentioned in the HPI and as above.    PHYSICAL EXAM: Filed Vitals:   08/14/15 0957  BP: 122/78  Pulse: 82  Temp: 97.9 F (36.6 C)  Resp: 16   Body mass index is 33.88 kg/(m^2).  General: Alert, no acute distress HEENT:  Normocephalic, atraumatic, oropharynx patent. Eye: Juliette Mangle [facility] Cardiovascular: Regular rate and rhythm, no rubs murmurs or gallops. No Carotid bruits, radial pulse intact. No pedal edema.  Respiratory: Clear to auscultation bilaterally. No wheezes, rales, or rhonchi. No cyanosis, no use of accessory musculature Abdominal: No organomegaly, abdomen is soft and non-tender, positive bowel sounds. No masses. Musculoskeletal: Gait intact. No edema, tenderness GU: Normal. Herpetic lesions are healed. No discharge.  Skin: No rashes. Neurologic: Facial musculature symmetric. Psychiatric: Patient acts appropriately throughout our interaction. Lymphatic: No cervical or submandibular lymphadenopathy  LABS:  Results for orders placed or performed in visit on 08/14/15  POCT urinalysis dipstick  Result Value Ref Range   Color, UA yellow yellow   Clarity, UA hazy (A) clear   Glucose, UA negative negative   Bilirubin, UA negative negative   Ketones, POC UA negative negative   Spec Grav, UA 1.025    Blood, UA small (A) negative   pH, UA 5.5    Protein Ur, POC negative negative   Urobilinogen, UA 0.2    Nitrite, UA Negative Negative   Leukocytes, UA Negative Negative  POCT Microscopic Urinalysis ([facility])  Result Value Ref  Range   WBC,UR,HPF,POC None None WBC/hpf   RBC,UR,HPF,POC None None RBC/hpf   Bacteria None None, Too numerous to count   Mucus Absent Absent   Epithelial Cells, UR Per Microscopy None None, Too numerous to count cells/hpf    EKG/XRAY:   Primary read interpreted by Dr. [facility]lene Farrier at [facility].  ASSESSMENT/PLAN: Patient had a positive urine culture for enterococcus sensitive to amoxicillin. He had a positive herpes culture. His PSA was elevated at over 17. Referral has been made to urology. He will be on Valtrex suppression 500 mg 1 a day. He will finish his amoxicillin. He was given a note to be out of work until Monday.I  personally performed the services described in this documentation, which was scribed in my presence. The recorded information has been reviewed and is accurate.    Gross sideeffects, risk and benefits, and alternatives of medications d/w patient. Patient is aware that all medications have potential sideeffects and we are unable to predict every sideeffect or drug-drug interaction that may occur.  Arlyss Queen MD 08/14/2015 10:07 AM

## 2015-08-14 NOTE — Telephone Encounter (Signed)
Letter sent.

## 2015-08-15 ENCOUNTER — Telehealth: Payer: Self-pay | Admitting: Internal Medicine

## 2015-08-15 DIAGNOSIS — N419 Inflammatory disease of prostate, unspecified: Secondary | ICD-10-CM | POA: Diagnosis not present

## 2015-08-15 DIAGNOSIS — Z0000 Encounter for general adult medical examination without abnormal findings: Secondary | ICD-10-CM | POA: Diagnosis not present

## 2015-08-15 NOTE — Telephone Encounter (Signed)
[phone], pt cb

## 2015-08-15 NOTE — Telephone Encounter (Signed)
Notes Recorded by Deneise Lever, MD on 08/12/2015 at 1:40 PM Echocardiogram- normal heart function. Good test result  Called and spoke with pt. Reviewed results and recs. Pt voiced understanding and had no further questions. Nothing further needed.

## 2015-08-15 NOTE — Telephone Encounter (Signed)
LVM for pt to return call

## 2015-08-20 ENCOUNTER — Telehealth: Payer: Self-pay

## 2015-08-20 DIAGNOSIS — F4310 Post-traumatic stress disorder, unspecified: Secondary | ICD-10-CM | POA: Diagnosis not present

## 2015-08-20 NOTE — Telephone Encounter (Signed)
Patient wants to know if he can get his work note extended for one day. Please call! [phone]

## 2015-08-21 NOTE — Telephone Encounter (Signed)
It is okay to extend his note for 1 more day.

## 2015-08-21 NOTE — Telephone Encounter (Signed)
Letter ready to pick up. Left on VM.

## 2015-08-26 ENCOUNTER — Ambulatory Visit (INDEPENDENT_AMBULATORY_CARE_PROVIDER_SITE_OTHER): Payer: Federal, State, Local not specified - PPO | Admitting: Family Medicine

## 2015-08-26 VITALS — BP 118/82 | HR 83 | Temp 97.7°F | Resp 18 | Ht 67.0 in | Wt 215.4 lb

## 2015-08-26 DIAGNOSIS — R300 Dysuria: Secondary | ICD-10-CM

## 2015-08-26 DIAGNOSIS — R3129 Other microscopic hematuria: Secondary | ICD-10-CM | POA: Diagnosis not present

## 2015-08-26 LAB — POCT URINALYSIS DIP (MANUAL ENTRY)
Bilirubin, UA: NEGATIVE
Glucose, UA: NEGATIVE
Ketones, POC UA: NEGATIVE
Leukocytes, UA: NEGATIVE
Nitrite, UA: NEGATIVE
Protein Ur, POC: NEGATIVE
Spec Grav, UA: 1.01
Urobilinogen, UA: 0.2
pH, UA: 5.5

## 2015-08-26 LAB — POC MICROSCOPIC URINALYSIS (UMFC): Mucus: ABSENT

## 2015-08-26 NOTE — Progress Notes (Signed)
Patient ID: Anthony [name], male    DOB: [birth year]  Age: 50 y.o. MRN: [id number]  Chief Complaint  Patient presents with  . Follow-up    UTI symptoms have returned  . PHQ Screening    see results    Subjective:   50 year old man who was here last month with a urinary tract infection. It grew enterococcus, so had to be changed from the doxycycline to amoxicillin. 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 He is on Valtrex 500 mg (one half of 1000 mg) daily. He saw his urologist for his elevated PSA, who is having him come back in a month or so to recheck it because of the possibility of a false elevation caused by the urinary infection. He has not noted any sores on his penis. His regular girlfriend is a long-distance relationship, so he only has sexual activity a couple of times a month with her. He has been with one other person but used a condom. He does not have a history of any urinary tract infections in the distant past that he calls. He has had a kidney stone in the past.  Has a history of PTSD. He is seeing someone at the [place] for that. He is still struggling with it. Was hospitalized in March with depression.  Current allergies, medications, problem list, past/family and social histories reviewed.  Objective:  BP 118/82 mmHg  Pulse 83  Temp(Src) 97.7 F (36.5 C) (Oral)  Resp 18  Ht 5\' 7"  (1.702 m)  Wt 215 lb 6.4 oz (97.705 kg)  BMI 33.73 kg/m2  SpO2 98%  Pleasant gentleman, no acute distress. No CVA tenderness. Normal male genitalia. No blistered lesions or erythema could be noted. He has a little cyst on the rim of the penis at about the 2:00 position. I told him to speak to the urologist about it also. It is been there a long time.  Assessment & Plan:   Assessment: 1. Dysuria   2. Microscopic hematuria       Plan:  Reviewed the urinalysis report and decide what we need to do with him. It is important that he continues to  take the Valtrex.  Orders Placed This Encounter  Procedures  . Urine culture    Order Specific Question:  Source    Answer:  urine  . POCT Microscopic Urinalysis ([facility])  . POCT urinalysis dipstick    No orders of the defined types were placed in this encounter.   Results for orders placed or performed in visit on 08/26/15  POCT Microscopic Urinalysis ([facility])  Result Value Ref Range   WBC,UR,HPF,POC None None WBC/hpf   RBC,UR,HPF,POC Few (A) None RBC/hpf   Bacteria None None, Too numerous to count   Mucus Absent Absent   Epithelial Cells, UR Per Microscopy None None, Too numerous to count cells/hpf  POCT urinalysis dipstick  Result Value Ref Range   Color, UA yellow yellow   Clarity, UA clear clear   Glucose, UA negative negative   Bilirubin, UA negative negative   Ketones, POC UA negative negative   Spec Grav, UA 1.010    Blood, UA small (A) negative   pH, UA 5.5    Protein Ur, POC negative negative   Urobilinogen, UA 0.2    Nitrite, UA Negative Negative   Leukocytes, UA Negative Negative         Patient Instructions  Continue taking the valacyclovir for the herpes.  Always use protection (condoms)  Follow-up with the urologist. Tell him that there was a small amount of microscopic hematuria (blood in urine) when you're in our office.  Drink lots of fluids to keep yourself well flushed food  Culture will be run on the urine and I will try to let you know the results of that a few days.   Return sooner if problems       Return if symptoms worsen or fail to improve.   Kaylee Wombles, MD 08/26/2015

## 2015-08-26 NOTE — Patient Instructions (Addendum)
Continue taking the valacyclovir for the herpes.  Always use protection (condoms)  Follow-up with the urologist. Tell him that there was a small amount of microscopic hematuria (blood in urine) when you're in our office.  Drink lots of fluids to keep yourself well flushed food  Culture will be run on the urine and I will try to let you know the results of that a few days.   Return sooner if problems  Call nutrition at [facility] and see if they can set you up to talk with a nurtritionist.  If a referral is needed contact us.

## 2015-08-27 DIAGNOSIS — Z86711 Personal history of pulmonary embolism: Secondary | ICD-10-CM | POA: Diagnosis not present

## 2015-08-27 DIAGNOSIS — Z7952 Long term (current) use of systemic steroids: Secondary | ICD-10-CM | POA: Diagnosis not present

## 2015-08-27 DIAGNOSIS — J45909 Unspecified asthma, uncomplicated: Secondary | ICD-10-CM | POA: Diagnosis not present

## 2015-08-27 LAB — URINE CULTURE
Colony Count: NO GROWTH
Organism ID, Bacteria: NO GROWTH

## 2015-08-28 ENCOUNTER — Telehealth: Payer: Self-pay | Admitting: Internal Medicine

## 2015-08-28 NOTE — Telephone Encounter (Signed)
Notes Recorded by Deneise Lever, MD on 08/12/2015 at 1:40 PM Echocardiogram- normal heart function. Good test result  Called spoke with pt. Reviewed results and recs. Pt voiced understanding and had no further questions.

## 2015-08-28 NOTE — Progress Notes (Signed)
Quick Note:  Called spoke with pt. Reviewed results and recs. Pt voiced understanding and had no further questions. ______ 

## 2015-08-30 DIAGNOSIS — H02402 Unspecified ptosis of left eyelid: Secondary | ICD-10-CM | POA: Diagnosis not present

## 2015-08-30 DIAGNOSIS — H40052 Ocular hypertension, left eye: Secondary | ICD-10-CM | POA: Diagnosis not present

## 2015-08-30 DIAGNOSIS — H40023 Open angle with borderline findings, high risk, bilateral: Secondary | ICD-10-CM | POA: Diagnosis not present

## 2015-09-02 DIAGNOSIS — F4310 Post-traumatic stress disorder, unspecified: Secondary | ICD-10-CM | POA: Diagnosis not present

## 2015-09-02 DIAGNOSIS — Z79899 Other long term (current) drug therapy: Secondary | ICD-10-CM | POA: Diagnosis not present

## 2015-09-07 ENCOUNTER — Ambulatory Visit (INDEPENDENT_AMBULATORY_CARE_PROVIDER_SITE_OTHER): Payer: Federal, State, Local not specified - PPO | Admitting: Family Medicine

## 2015-09-07 VITALS — BP 116/74 | HR 76 | Temp 98.2°F | Resp 16 | Ht 67.0 in | Wt 214.0 lb

## 2015-09-07 DIAGNOSIS — K120 Recurrent oral aphthae: Secondary | ICD-10-CM

## 2015-09-07 MED ORDER — LIDOCAINE VISCOUS 2 % MT SOLN: OROMUCOSAL | Status: DC

## 2015-09-07 MED ORDER — CHLORHEXIDINE GLUCONATE 0.12 % MT SOLN: 15.0000 mL | Freq: Two times a day (BID) | OROMUCOSAL | Status: DC

## 2015-09-07 NOTE — Progress Notes (Signed)
Subjective:    Patient ID: Anthony [name], male    DOB: 1965-07-29, 50 y.o.   MRN: [id number] By signing my name below, I, Judithe Modest, attest that this documentation has been prepared under the direction and in the presence of Delman Cheadle, MD. Electronically Signed: Judithe Modest, ER Scribe. 09/07/2015. 12:06 PM.  Chief Complaint  Patient presents with  . Mass    side of tongue, x 2 days, painful    HPI HPI Comments: Anthony [name] is a 50 y.o. male who presents to [facility] complaining of a painful white spot on the right side of his tongue for the last two days. He was awakened at night due to the pain. He has gargled with warm salt water. He has not other painful areas that he is aware of. He has a past hx of cold sores. He is due for a dental appointment. He is taking a mens multivitamin.   Past Medical History  Diagnosis Date  . Asthma   . Hyperlipidemia   . H/O renal calculi 2006  . Spondylosis   . Fatty liver   . Internal hemorrhoid   . Tubular adenoma of colon 2011    Dr Olevia Perches  . Cataract   . PE (pulmonary embolism)   . DVT (deep venous thrombosis) ([place])   . Pneumonia   . Anemia   . Anxiety   . Peptic ulcer disease   . Depression   . Kidney stones    No Known Allergies  Current Outpatient Prescriptions on File Prior to Visit  Medication Sig Dispense Refill  . albuterol (PROVENTIL HFA;VENTOLIN HFA) 108 (90 Base) MCG/ACT inhaler Inhale 2 puffs into the lungs every 6 (six) hours as needed. For shortness of breath 1 Inhaler 5  . apixaban (ELIQUIS) 5 MG TABS tablet Take 1 tablet (5 mg total) by mouth 2 (two) times daily. 60 tablet 5  . ARIPiprazole (ABILIFY) 10 MG tablet Take 10 mg by mouth daily.    . mupirocin ointment (BACTROBAN) 2 % App to skin irritation tid prn 22 g 5  . PARoxetine (PAXIL) 10 MG tablet Take 10 mg by mouth daily.    Marland Kitchen SIMBRINZA 1-0.2 % SUSP     . SYMBICORT 160-4.5 MCG/ACT inhaler     . timolol (TIMOPTIC-XR) 0.5 % ophthalmic gel-forming     .  valACYclovir (VALTREX) 1000 MG tablet Take one half tablet daily 30 tablet 11  . amoxicillin (AMOXIL) 875 MG tablet Take 1 tablet (875 mg total) by mouth 2 (two) times daily. (Patient not taking: Reported on 09/07/2015) 20 tablet 0  . Bromfenac Sodium (PROLENSA) 0.07 % SOLN as directed. Reported on 09/07/2015    . FLUoxetine (PROZAC) 20 MG tablet Take 1 tablet (20 mg total) by mouth daily. (Patient not taking: Reported on 09/07/2015) 30 tablet 3  . oxyCODONE-acetaminophen (PERCOCET) 5-325 MG tablet Take 1-2 tablets by mouth every 4 (four) hours as needed. (Patient not taking: Reported on 09/07/2015) 10 tablet 0   No current facility-administered medications on file prior to visit.    Review of Systems  Constitutional: Negative for fever, chills, activity change, appetite change and unexpected weight change.  HENT: Negative for congestion, dental problem, sore throat, trouble swallowing and voice change.   Gastrointestinal: Negative for abdominal pain.  Musculoskeletal: Negative for neck pain and neck stiffness.  Skin: Positive for color change and wound.  Allergic/Immunologic: Negative for environmental allergies, food allergies and immunocompromised state.  Hematological: Negative for adenopathy.  Objective:  BP 116/74 mmHg  Pulse 76  Temp(Src) 98.2 F (36.8 C)  Resp 16  Ht 5\' 7"  (1.702 m)  Wt 214 lb (97.07 kg)  BMI 33.51 kg/m2  SpO2 98%  Physical Exam  Constitutional: He is oriented to person, place, and time. He appears well-developed and well-nourished. No distress.  HENT:  Head: Normocephalic and atraumatic.  Two 1-2 mm shallow white based ulcerations immediately adjacent to each other.   Eyes: Pupils are equal, round, and reactive to light.  Neck: Neck supple.  Cardiovascular: Normal rate.   Pulmonary/Chest: Effort normal. No respiratory distress.  Musculoskeletal: Normal range of motion.  Neurological: He is alert and oriented to person, place, and time. Coordination normal.   Skin: Skin is warm and dry. He is not diaphoretic.  Psychiatric: He has a normal mood and affect. His behavior is normal.  Nursing note and vitals reviewed.     Assessment & Plan:   1. Recurrent canker sores   advised applying lidocaine with gauze or qtip to aphthous ulcers.  rec f/u with dentist as oral hygeine maybe contributing to recent recurrent breakouts.  Meds ordered this encounter  Medications  . lidocaine (XYLOCAINE) 2 % solution    Sig: Use 5 ml po as directed every 2 hours as needed for pain    Dispense:  200 mL    Refill:  0  . chlorhexidine (PERIDEX) 0.12 % solution    Sig: Use as directed 15 mLs in the mouth or throat 2 (two) times daily. After the symbicort inhaler - swish and spit    Dispense:  120 mL    Refill:  0    I personally performed the services described in this documentation, which was scribed in my presence. The recorded information has been reviewed and considered, and addended by me as needed.  Delman Cheadle, MD MPH

## 2015-09-07 NOTE — Patient Instructions (Addendum)
IF you received an x-ray today, you will receive an invoice from [facility]. Please contact [facility] at [phone] with questions or concerns regarding your invoice.   IF you received labwork today, you will receive an invoice from [employer]. Please contact [facility] at [phone] with questions or concerns regarding your invoice.   Our billing staff will not be able to assist you with questions regarding bills from these companies.  You will be contacted with the lab results as soon as they are available. The fastest way to get your results is to activate your My Chart account. Instructions are located on the last page of this paperwork. If you have not heard from [place] regarding the results in 2 weeks, please contact this office.    Canker Sores Canker sores are small, painful sores that develop inside your mouth. They may also be called aphthous ulcers. You can get canker sores on the inside of your lips or cheeks, on your tongue, or anywhere inside your mouth. You can have just one canker sore or several of them. Canker sores cannot be passed from one person to another (noncontagious). These sores are different than the sores that you may get on the outside of your lips (cold sores or fever blisters). Canker sores usually start as painful red bumps. Then they turn into small white, yellow, or gray ulcers that have red borders. The ulcers may be quite painful. The pain may be worse when you eat or drink. CAUSES The cause of this condition is not known. RISK FACTORS This condition is more likely to develop in:  Women.  People in their teens or 40s.  Women who are having their menstrual period.  People who are under a lot of emotional stress.  People who do not get enough iron or B vitamins.  People who have poor oral hygiene.  People who have an injury inside the mouth. This can happen after having dental work or from chewing  something hard. SYMPTOMS Along with the canker sore, symptoms may also include:  Fever.  Fatigue.  Swollen lymph nodes in your neck. DIAGNOSIS This condition can be diagnosed based on your symptoms. Your health care provider will also examine your mouth. Your health care provider may also do tests if you get canker sores often or if they are very bad. Tests may include:  Blood tests to rule out other causes of canker sores.  Taking swabs from the sore to check for infection.  Taking a small piece of skin from the sore (biopsy) to test it for cancer. TREATMENT Most canker sores clear up without treatment in about 10 days. Home care is usually the only treatment that you will need. Over-the-counter medicines can relieve discomfort.If you have severe canker sores, your health care provider may prescribe:  Numbing ointment to relieve pain.  Vitamins.  Steroid medicines. These may be given as:  Oral pills.  Mouth rinses.  Gels.  Antibiotic mouth rinse. HOME CARE INSTRUCTIONS  Apply, take, or use medicines only as directed by your health care provider. These include vitamins.  If you were prescribed an antibiotic mouth rinse, finish all of it even if you start to feel better.  Until the sores are healed:  Do not drink coffee or citrus juices.  Do not eat spicy or salty foods.  Use a mild, over-the-counter mouth rinse as directed by your health care provider.  Practice good oral hygiene.  Floss your teeth every day.  Brush your teeth with a soft brush twice each day. SEEK MEDICAL CARE IF:  Your symptoms do not get better after two weeks.  You also have a fever or swollen glands.  You get canker sores often.  You have a canker sore that is getting larger.  You cannot eat or drink due to your canker sores.   This information is not intended to replace advice given to you by your health care provider. Make sure you discuss any questions you have with your health  care provider.   Document Released: 08/15/2010 Document Revised: 09/04/2014 Document Reviewed: 03/21/2014 Elsevier Interactive Patient Education [employer].

## 2015-09-09 ENCOUNTER — Telehealth: Payer: Self-pay

## 2015-09-09 DIAGNOSIS — I824Z2 Acute embolism and thrombosis of unspecified deep veins of left distal lower extremity: Secondary | ICD-10-CM

## 2015-09-09 NOTE — Telephone Encounter (Signed)
Patient said his 6 months is coming up from being on blood thinners and wanted to know if he needs to make a follow up or stop taking the medications. Also he states his girlfriend has a virginal infection and she told him he couldn't catch it. He wants to know if that is true. He wanted you to call him back.

## 2015-09-10 DIAGNOSIS — J45909 Unspecified asthma, uncomplicated: Secondary | ICD-10-CM | POA: Diagnosis not present

## 2015-09-10 DIAGNOSIS — Z86711 Personal history of pulmonary embolism: Secondary | ICD-10-CM | POA: Diagnosis not present

## 2015-09-10 DIAGNOSIS — H2511 Age-related nuclear cataract, right eye: Secondary | ICD-10-CM | POA: Diagnosis not present

## 2015-09-10 DIAGNOSIS — Z86718 Personal history of other venous thrombosis and embolism: Secondary | ICD-10-CM | POA: Diagnosis not present

## 2015-09-10 DIAGNOSIS — H40052 Ocular hypertension, left eye: Secondary | ICD-10-CM | POA: Diagnosis not present

## 2015-09-10 DIAGNOSIS — Z961 Presence of intraocular lens: Secondary | ICD-10-CM | POA: Diagnosis not present

## 2015-09-10 DIAGNOSIS — H02403 Unspecified ptosis of bilateral eyelids: Secondary | ICD-10-CM | POA: Diagnosis not present

## 2015-09-10 DIAGNOSIS — H11133 Conjunctival pigmentations, bilateral: Secondary | ICD-10-CM | POA: Diagnosis not present

## 2015-09-10 DIAGNOSIS — Z7901 Long term (current) use of anticoagulants: Secondary | ICD-10-CM | POA: Diagnosis not present

## 2015-09-10 NOTE — Telephone Encounter (Signed)
Please advise about the blood thinners.

## 2015-09-11 ENCOUNTER — Other Ambulatory Visit: Payer: Self-pay | Admitting: Emergency Medicine

## 2015-09-11 ENCOUNTER — Other Ambulatory Visit: Payer: Self-pay | Admitting: Radiology

## 2015-09-11 DIAGNOSIS — F4310 Post-traumatic stress disorder, unspecified: Secondary | ICD-10-CM | POA: Diagnosis not present

## 2015-09-11 MED ORDER — VALACYCLOVIR HCL 1 G PO TABS: ORAL_TABLET | ORAL | Status: DC

## 2015-09-11 NOTE — Telephone Encounter (Signed)
Valcyclovir/pt has requested refill, pt advised one yr supply sent last month, he states not received at pharmacy/ have resent for him

## 2015-09-12 NOTE — Telephone Encounter (Signed)
Dr Brigitte Pulse, you just saw pt last week and Rxd a daily dose of this. Do you want pt to have this Rx also for outbreaks?

## 2015-09-12 NOTE — Telephone Encounter (Signed)
Pt called to check up on this request.  °

## 2015-09-13 NOTE — Telephone Encounter (Signed)
Patient will finish his 6 month course and stop eliquis. He will come in and have a blood test about a week after he stops.

## 2015-09-13 NOTE — Telephone Encounter (Signed)
Ok to stop Eliquis. I have placed orders for a blood test to check for causes of the blood clot. Please complete after stopping the Eliquis.

## 2015-09-14 NOTE — Telephone Encounter (Signed)
No, it was for an atypical reason so no refills w/o OV.  If he is continuing to have canker sores, he should probably go see his dentist - sometimes it is due to a dental hygeine issue.

## 2015-09-17 DIAGNOSIS — J45909 Unspecified asthma, uncomplicated: Secondary | ICD-10-CM | POA: Diagnosis not present

## 2015-09-20 DIAGNOSIS — G453 Amaurosis fugax: Secondary | ICD-10-CM | POA: Diagnosis not present

## 2015-09-20 DIAGNOSIS — H02402 Unspecified ptosis of left eyelid: Secondary | ICD-10-CM | POA: Diagnosis not present

## 2015-09-20 DIAGNOSIS — H40023 Open angle with borderline findings, high risk, bilateral: Secondary | ICD-10-CM | POA: Diagnosis not present

## 2015-09-20 DIAGNOSIS — Z961 Presence of intraocular lens: Secondary | ICD-10-CM | POA: Diagnosis not present

## 2015-10-14 ENCOUNTER — Telehealth: Payer: Self-pay | Admitting: Family

## 2015-10-14 DIAGNOSIS — K120 Recurrent oral aphthae: Secondary | ICD-10-CM | POA: Diagnosis not present

## 2015-10-14 DIAGNOSIS — R05 Cough: Secondary | ICD-10-CM | POA: Diagnosis not present

## 2015-10-14 DIAGNOSIS — I824Z2 Acute embolism and thrombosis of unspecified deep veins of left distal lower extremity: Secondary | ICD-10-CM

## 2015-10-14 DIAGNOSIS — R599 Enlarged lymph nodes, unspecified: Secondary | ICD-10-CM | POA: Diagnosis not present

## 2015-10-14 NOTE — Telephone Encounter (Signed)
Please advise 

## 2015-10-14 NOTE — Telephone Encounter (Signed)
6.12.17 Pt came in wanting to know when he stops taking the Eliqus, how long to wait before getting blood work done, please call pt, MS

## 2015-10-14 NOTE — Telephone Encounter (Signed)
Medication should be out of his system after about 4 days.

## 2015-10-15 NOTE — Telephone Encounter (Signed)
LVM letting pt know.  

## 2015-10-21 ENCOUNTER — Other Ambulatory Visit: Payer: Federal, State, Local not specified - PPO

## 2015-10-21 DIAGNOSIS — I824Z2 Acute embolism and thrombosis of unspecified deep veins of left distal lower extremity: Secondary | ICD-10-CM

## 2015-10-24 DIAGNOSIS — M791 Myalgia: Secondary | ICD-10-CM | POA: Diagnosis not present

## 2015-10-24 DIAGNOSIS — M4306 Spondylolysis, lumbar region: Secondary | ICD-10-CM | POA: Diagnosis not present

## 2015-10-24 LAB — HYPERCOAGULABLE PANEL, COMPREHENSIVE
AntiThromb III Func: 105 % activity (ref 80–120)
Anticardiolipin IgA: <11 [APL'U]
Anticardiolipin IgG: <14 [GPL'U]
Anticardiolipin IgM: <12 [MPL'U]
Beta-2 Glyco I IgG: <9 SGU (ref <=20)
Beta-2-Glycoprotein I IgA: <9 SAU (ref <=20)
Beta-2-Glycoprotein I IgM: <9 SMU (ref <=20)
PROTEIN S ANTIGEN, TOTAL: 95 % (ref 70–140)
Protein C Activity: 145 % (ref 70–180)
Protein C Antigen: 125 % (ref 70–140)
Protein S Activity: 60 % — ABNORMAL LOW (ref 70–150)

## 2015-10-24 LAB — RFX PTT-LA W/RFX TO HEX PHASE CONF: PTT-LA Screen: 40 s (ref <=40)

## 2015-10-24 LAB — RFX DRVVT SCR W/RFLX CONF 1:1 MIX: dRVVT Screen: 40 s (ref <=45)

## 2015-10-25 ENCOUNTER — Encounter: Payer: Self-pay | Admitting: Family

## 2015-10-25 ENCOUNTER — Other Ambulatory Visit: Payer: Self-pay | Admitting: Rehabilitation

## 2015-10-25 DIAGNOSIS — M47816 Spondylosis without myelopathy or radiculopathy, lumbar region: Secondary | ICD-10-CM

## 2015-10-28 ENCOUNTER — Encounter: Payer: Self-pay | Admitting: Family

## 2015-10-28 ENCOUNTER — Other Ambulatory Visit: Payer: Self-pay | Admitting: Family

## 2015-10-28 DIAGNOSIS — I82402 Acute embolism and thrombosis of unspecified deep veins of left lower extremity: Secondary | ICD-10-CM

## 2015-10-29 ENCOUNTER — Ambulatory Visit
Admission: RE | Admit: 2015-10-29 | Discharge: 2015-10-29 | Disposition: A | Discharge status: Home / Self Care | Payer: Federal, State, Local not specified - PPO | Source: Ambulatory Visit | Attending: Rehabilitation | Admitting: Rehabilitation

## 2015-10-29 DIAGNOSIS — M47816 Spondylosis without myelopathy or radiculopathy, lumbar region: Secondary | ICD-10-CM

## 2015-10-29 DIAGNOSIS — M545 Low back pain: Secondary | ICD-10-CM | POA: Diagnosis not present

## 2015-10-30 DIAGNOSIS — F4310 Post-traumatic stress disorder, unspecified: Secondary | ICD-10-CM | POA: Diagnosis not present

## 2015-10-31 ENCOUNTER — Ambulatory Visit (INDEPENDENT_AMBULATORY_CARE_PROVIDER_SITE_OTHER): Payer: Federal, State, Local not specified - PPO | Admitting: Internal Medicine

## 2015-10-31 ENCOUNTER — Encounter: Payer: Self-pay | Admitting: Internal Medicine

## 2015-10-31 VITALS — BP 120/80 | HR 93 | Ht 67.0 in | Wt 216.8 lb

## 2015-10-31 DIAGNOSIS — I2699 Other pulmonary embolism without acute cor pulmonale: Secondary | ICD-10-CM

## 2015-10-31 NOTE — Progress Notes (Signed)
09/25/11- 23 yoM never smoker, referred by Dr. Linna Darner for allergy evaluation. He had hayfever with asthma as a child, sinus infections and seasonal rhinitis with sneezing chest tightness and shortness of breath. While in the [employer] in [place] in the late 1980s, he had a bronchial pneumonia. Subsequent episodes of wheezing and cough were called asthma. He was followed by Dr Caprice Red years ago but those records are no longer available. He was on allergy vaccine or one year. He recently had exacerbation and had to leave work 6 days ago, going to the emergency room for nebulizer treatment with symptoms of an upper respiratory infection. The next day he was seen at an urgent care and treated with prednisone. He has been out of work all week. His usual management includes Symbicort and at least daily use of a rescue inhaler. He is finishing a course of prednisone. Nasal discharge is clear yellow. He had frontal headache early in the week, now resolved. No ENT surgery. No problem with latex, contrast dye, aspirin, specific foods. He has had eczema. 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 Scientist, research (life sciences), getting medications from [facility]. CXR-02/01/09- reviewed- IMPRESSION:  No active lung disease. Question bronchitis.  Provider: Lelon Huh  11/12/11- 45 yoM never smoker followed for allergic rhinitis, asthma Review PFT results with patient; feels like everything has cleared up since last visit and with using Dymista(did not want Rx) Allergy Profile 09/25/2011-total IgE 159.6 with multiple specific elevations especially for grass pollens, molds, tree pollens and ragweed. PFT: 10/08/2011-mild obstructive airways disease-small airways, insignificant response to bronchodilator.  06/03/12- 45 yoM never smoker followed for allergic rhinitis, asthma Follows for : pt states still having some wheezing,sob,with increase activity denies any coughing. Pt stated sample of  dymista seemed to help  Brings FMLA form for completion. Still a lot of tight wheezing most days worse with cold air, sexual activity, exertion. Short of breath with exertion. Not much rhinitis. Still has Symbicort 160. Using Ventolin rescue inhaler occasionally.  02/27/13- 47 yoM never smoker followed for allergic rhinitis, asthma FOLLOWS FOR: had asthma attack since yesterday-was at aunt's house with alot of smoke. Has used albuterol tx's, having SOB and wheezing; hard time speaking, soreness in right side of torso and back. Refuses flu vaccine saying it makes him sick every time-discussed. He visited family member recently where people were smoking. Since then increased sneeze and cough for 2 or 3 days. Tussive soreness right lateral rib. Symbicort 160 and rescue inhaler are not enough help. CXR 06/30/12 IMPRESSION:  No focal infiltrates are present. Mild peribronchial thickening  may indicate bronchitis. Similar appearance to priors.  Original Report Authenticated By: Rolla Flatten, M.D.  08/31/13- 77 yoM never smoker followed for allergic rhinitis, asthma FOLLOWS FOR:  Increase sob over the past week with some tightness in chest 3 or 4 days of increasing shortness of breath and increased use of his rescue inhaler. Does not recognize infection. Nasal airway clear with no sneezing. 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 He was diagnosed with asthma and bronchopneumonia at that time. He says he had no asthma before that pneumonia and he denies family history of asthma. He requests a letter supporting his argument that asthma developed as a consequence of pneumonia which occurred while he was in [employer].  07/30/2015-50 year old male never smoker followed for allergic rhinitis, asthma, complicated by  history [id number], peptic ulcer,  depression (VAH), glaucoma FOLLOWS FOR: Pt was  having breathing issues when appt was made; had blood clots in Dec 2016-calf and lungs.  Pt would like to speak with CY about medications for blood clots. Pt has had nasal congestion as well. Hypercoag panel 04/26/15- incr Protein C,/ managed by Elna Breslow, FNP- Eliquis Reports difficult past several months beginning with need for eye surgery, then bleeding peptic ulcer, then pulmonary embolism after 17 Hour car drive from [place], then hospitalized for major depression which will be treated at [place].  Some seasonal rhinitis with nasal stuffiness and postnasal drip. We discussed indications for continuing or stopping his anticoagulant the end of 6 months. He had helped. Sooner. No bleeding since ulcer healed. Some dyspnea with moderate exertion, or hurrying upstairs etc. No chest pain or significant leg discomfort. CXR 05/23/2015-NAD  10/31/2015-50 year old male never smoker followed for allergic rhinitis, asthma, complicated by history PE/DVT 2016, peptic ulcer, depression (VAH), glaucoma FOLLOW FOR: patient wants to review labs done at Dr. Purcell Nails office, wants to have Dr. Annamaria Boots follow up on his blood clots. Pt said that allergies are fine, he is worried about blood clots right now. Has PE, last CT Angio done 05/02/15. Hypercoagulable panel showed elevated protein C, decreased protein S. His DVT had developed after a very long car ride. He started Eliquis in January and has been off it for 2 weeks, representing 6 months of therapy. No recurrence of DVT/PE identified. He continues aspirin 81 mg with no bleeding and has a hematology appointment soon for coagulopathy evaluation.  ROS-see HPI Constitutional:   No-   weight loss, night sweats, fevers, chills, fatigue, lassitude. HEENT:   No-  headaches, difficulty swallowing, tooth/dental problems, sore throat,    no-sneezing, no-itching, ear ache, no- nasal congestion, post nasal drip,  CV:  No--chest pain, No- orthopnea, PND, swelling in lower extremities,  anasarca, dizziness, palpitations Resp: +shortness of breath with exertion or at rest.              No-  productive cough,  No non-productive cough,  No- coughing up of blood.                change in color of mucus.  No- wheezing.   Skin:  GI:  No-   heartburn, indigestion, abdominal pain, nausea, vomiting, GU:, MS:  No-   joint pain or swelling.   Neuro-     nothing unusual Psych:  No- change in mood or affect. No depression or anxiety.  No memory loss.  OBJ- Physical Exam General- Alert, Oriented, Affect-appropriate, Distress- none acute Skin- + mild hyperkeratosis on anterior chest Lymphadenopathy- none Head- atraumatic            Eyes- Gross vision intact, PERRLA, conjunctivae and secretions clear            Ears- Hearing, canals-normal            Nose-no-sniffing, no-Septal dev, polyps, erosion, perforation             Throat- Mallampati III , mucosa clear , drainage- none, tonsils- atrophic Neck- flexible , trachea midline, no stridor , thyroid nl, carotid no bruit Chest - symmetrical excursion , unlabored           Heart/CV- RRR , no murmur , no gallop  , no rub, nl s1 s2                           -  JVD- none , edema- none, stasis changes- none, varices- none           Lung- clear to P&A, wheeze-none, cough- none , dullness-none, rub- none           Chest wall-  Abd-  Br/ Gen/ Rectal- Not done, not indicated Extrem- cyanosis- none, clubbing, none, atrophy- none, strength- nl, negative Homan's Neuro- grossly intact to observation

## 2015-10-31 NOTE — Patient Instructions (Signed)
Ok to continue the aspirin and stay of Eliquis until you see the Hematologist. You can check with your primary care provider about when your Hematology appointment is scheduled.   You want to avoid stagnant blood flow so don't sit too long without moving your legs to keep the circulation open. When you sit, try to raise your legs on a foot stool or something

## 2015-11-02 NOTE — Assessment & Plan Note (Signed)
He completed 6 months of therapy with Eliquis which is probably sufficient. His DVT developed after a specific triggering incident-prolonged car ride. No evidence currently to suggest chronic thromboembolism. Plan-no evidence of recurring VTE. Okay to stay off of Eliquis until he sees hematology. He should follow their recommendation.

## 2015-11-14 DIAGNOSIS — R110 Nausea: Secondary | ICD-10-CM | POA: Diagnosis not present

## 2015-11-14 DIAGNOSIS — Z8711 Personal history of peptic ulcer disease: Secondary | ICD-10-CM | POA: Diagnosis not present

## 2015-11-14 DIAGNOSIS — R1011 Right upper quadrant pain: Secondary | ICD-10-CM | POA: Diagnosis not present

## 2015-11-14 DIAGNOSIS — K529 Noninfective gastroenteritis and colitis, unspecified: Secondary | ICD-10-CM | POA: Diagnosis not present

## 2015-11-18 ENCOUNTER — Ambulatory Visit (HOSPITAL_BASED_OUTPATIENT_CLINIC_OR_DEPARTMENT_OTHER): Payer: Federal, State, Local not specified - PPO | Admitting: Hematology and Oncology

## 2015-11-18 ENCOUNTER — Encounter: Payer: Self-pay | Admitting: Hematology and Oncology

## 2015-11-18 VITALS — BP 117/81 | HR 80 | Temp 97.9°F | Resp 18 | Ht 67.0 in | Wt 211.2 lb

## 2015-11-18 DIAGNOSIS — Z809 Family history of malignant neoplasm, unspecified: Secondary | ICD-10-CM | POA: Diagnosis not present

## 2015-11-18 DIAGNOSIS — Z86718 Personal history of other venous thrombosis and embolism: Secondary | ICD-10-CM | POA: Insufficient documentation

## 2015-11-18 DIAGNOSIS — R972 Elevated prostate specific antigen [PSA]: Secondary | ICD-10-CM | POA: Diagnosis not present

## 2015-11-18 DIAGNOSIS — Z86711 Personal history of pulmonary embolism: Secondary | ICD-10-CM | POA: Diagnosis not present

## 2015-11-18 NOTE — Progress Notes (Signed)
[name] NOTE  Patient Care Team: [place], FNP as PCP - General (Family Medicine)  CHIEF COMPLAINTS/PURPOSE OF CONSULTATION:  History of left lower extremity DVT and PE, elevated PSA, borderline abnormal thrombophilia panel  HISTORY OF PRESENTING ILLNESS:  Anthony [name] 50 y.o. male is here because of complaints above. The patient was diagnosed with first onset of DVT/PE in December 2016. Prior to the diagnosis, he had a long distance strip to [place] in early December He flew to [place] but drove 17 hours back to [place] without a break.Marland Kitchen He denies recent history of trauma, recent surgery, smoking or hormonal treatment/testosterone use Prior to the diagnosis of blood clot, the patient presented with bleeding ulcer. He had been taking a lot of NSAID  On 04/26/2015, he presented to his primary care physician's office with a left calf pain. 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 On 05/02/2015, he was noted to have shortness of breath. CT angiogram show right lower lobe pulmonary emboli. He was started on Eliquis and completed treatment by June While on treatment, he has no bleeding complications He had no prior history or diagnosis of cancer. His age appropriate screening programs are up-to-date. However, he was noted to have elevated PSA in April, thought to be related to bladder infection.  He was treated appropriately with follow-up planned by urologist but he did not show up to his recent appointment He had prior surgeries before and never had perioperative thromboembolic events. There is no family history of blood clots or miscarriages. He was tested for thrombophilia disorder in December. Results were within normal limits. Recently, he had repeat blood tests again in June 2017. Protein S level was borderline low but he just came off anticoagulation  therapy less than 7 days prior to the blood being drawn  MEDICAL HISTORY:  Past Medical History  Diagnosis Date  . Asthma   . Hyperlipidemia   . H/O renal calculi 2006  . Spondylosis   . Fatty liver   . Internal hemorrhoid   . Tubular adenoma of colon 2011    Dr Olevia Perches  . Cataract   . PE (pulmonary embolism)   . DVT (deep venous thrombosis) ([place])   . Pneumonia   . Anemia   . Anxiety   . Peptic ulcer disease   . Depression   . Kidney stones     SURGICAL HISTORY: Past Surgical History  Procedure Laterality Date  . Cystoscopy w/ stone manipulation  2006  . Shoulder surgery Right 12/17/2010    Dr French Ana ; shoulder impingement & torn tendon  . Inguinal hernia repair Right 1990  . Colonoscopy with polypectomy  2011    Dr Olevia Perches, hemorrhoids  . Cataract extraction Left 1996    Dr Elonda Husky; OS    SOCIAL HISTORY: Social History   Social History  . Marital Status: Divorced    Spouse Name: N/A  . Number of Children: 4  . Years of Education: N/A   Occupational History  . Not on file.   Social History Main Topics  . Smoking status: Never Smoker   . Smokeless tobacco: Never Used  . Alcohol Use: 0.0 oz/week    0 Standard drinks or equivalent per week     Comment: Socially , < 2X/ month  . Drug Use: No  . Sexual Activity: Not on file     Comment: 2  boys and 2 girls, work at the post [employer]   Other Topics Concern  . Not on file   Social History Narrative    FAMILY HISTORY: Family History  Problem Relation Age of Onset  . Heart attack Mother 54  . Stroke Father 10  . Diabetes Father     PVD  . Glaucoma Father     blindness  . Cancer Paternal Grandmother     ? primary  . Heart attack Maternal Uncle      X2,pre 47  . Asthma Neg Hx   . COPD Neg Hx   . Heart attack Maternal Grandmother 73    ALLERGIES:  has No Known Allergies.  MEDICATIONS:  Current Outpatient Prescriptions  Medication Sig Dispense Refill  . albuterol (PROVENTIL HFA;VENTOLIN HFA) 108 (90  Base) MCG/ACT inhaler Inhale 2 puffs into the lungs every 6 (six) hours as needed. For shortness of breath 1 Inhaler 5  . apixaban (ELIQUIS) 5 MG TABS tablet Take 1 tablet (5 mg total) by mouth 2 (two) times daily. (Patient not taking: Reported on 10/31/2015) 60 tablet 5  . ARIPiprazole (ABILIFY) 10 MG tablet Take 10 mg by mouth daily.    . Bromfenac Sodium (PROLENSA) 0.07 % SOLN as directed. Reported on 09/07/2015    . chlorhexidine (PERIDEX) 0.12 % solution Use as directed 15 mLs in the mouth or throat 2 (two) times daily. After the symbicort inhaler - swish and spit 120 mL 0  . lidocaine (XYLOCAINE) 2 % solution Use 5 ml po as directed every 2 hours as needed for pain 200 mL 0  . mupirocin ointment (BACTROBAN) 2 % App to skin irritation tid prn 22 g 5  . PARoxetine (PAXIL) 10 MG tablet Take 10 mg by mouth daily.    Marland Kitchen SIMBRINZA 1-0.2 % SUSP     . SYMBICORT 160-4.5 MCG/ACT inhaler     . timolol (TIMOPTIC-XR) 0.5 % ophthalmic gel-forming     . valACYclovir (VALTREX) 1000 MG tablet Take one half tablet daily 30 tablet 11   No current facility-administered medications for this visit.    REVIEW OF SYSTEMS:   Constitutional: Denies fevers, chills or abnormal night sweats Eyes: Denies blurriness of vision, double vision or watery eyes Ears, nose, mouth, throat, and face: Denies mucositis or sore throat Respiratory: Denies cough, dyspnea or wheezes Cardiovascular: Denies palpitation, chest discomfort or lower extremity swelling Gastrointestinal:  Denies nausea, heartburn or change in bowel habits Skin: Denies abnormal skin rashes Lymphatics: Denies new lymphadenopathy or easy bruising Neurological:Denies numbness, tingling or new weaknesses Behavioral/Psych: Mood is stable, no new changes  All other systems were reviewed with the patient and are negative.  PHYSICAL EXAMINATION: ECOG PERFORMANCE STATUS: 0 - Asymptomatic  Filed Vitals:   11/18/15 1150  BP: 117/81  Pulse: 80  Temp: 97.9 F  (36.6 C)  Resp: 18   Filed Weights   11/18/15 1150  Weight: 211 lb 3.2 oz (95.8 kg)    GENERAL:alert, no distress and comfortable SKIN: skin color, texture, turgor are normal, no rashes or significant lesions EYES: normal, conjunctiva are pink and non-injected, sclera clear OROPHARYNX:no exudate, no erythema and lips, buccal mucosa, and tongue normal  NECK: supple, thyroid normal size, non-tender, without nodularity LYMPH:  no palpable lymphadenopathy in the cervical, axillary or inguinal LUNGS: clear to auscultation and percussion with normal breathing effort HEART: regular rate & rhythm and no murmurs and no lower extremity edema ABDOMEN:abdomen soft, non-tender and normal bowel sounds Musculoskeletal:no cyanosis of digits and  no clubbing  PSYCH: alert & oriented x 3 with fluent speech NEURO: no focal motor/sensory deficits  LABORATORY DATA:  I have reviewed the data as listed  RADIOGRAPHIC STUDIES: I have personally reviewed the radiological images as listed and agreed with the findings in the report. Mr Lumbar Spine Wo Contrast  10/29/2015  CLINICAL DATA:  Increased back pain and numbness down the left leg. EXAM: MRI LUMBAR SPINE WITHOUT CONTRAST TECHNIQUE: Multiplanar, multisequence MR imaging of the lumbar spine was performed. No intravenous contrast was administered. COMPARISON:  None. FINDINGS: Segmentation:  Standard. Alignment:  Physiologic. Vertebrae: No fracture, evidence of discitis, or bone lesion. Marrow T1 hypointensity is present. Although this can be caused by marrow infiltrative processes, the most common causes include anemia, smoking, obesity, or advancing age. Sacroiliac joint: Degenerative changes of the visualized bilateral sacroiliac joints. Conus medullaris: Extends to the T12 level and appears normal. Paraspinal and other soft tissues: Negative. Disc levels: Disc spaces: Disc heights are maintained. T12-L1: No significant disc bulge. No evidence of neural  foraminal stenosis. No central canal stenosis. L1-L2: No significant disc bulge. No evidence of neural foraminal stenosis. No central canal stenosis. L2-L3: No significant disc bulge. Mild bilateral facet arthropathy. No evidence of neural foraminal stenosis. No central canal stenosis. L3-L4: No significant disc bulge. Mild bilateral facet arthropathy. No evidence of neural foraminal stenosis. No central canal stenosis. L4-L5: No significant disc bulge. Mild bilateral facet arthropathy. No evidence of neural foraminal stenosis. No central canal stenosis. L5-S1: No significant disc bulge. Moderate right and mild left facet arthropathy. No evidence of neural foraminal stenosis. No central canal stenosis. IMPRESSION: 1. No significant disc protrusion, foraminal stenosis or central canal stenosis. 2. Marrow T1 hypointensity is present. Although this can be caused by marrow infiltrative processes, the most common causes include anemia, smoking, obesity, or advancing age. 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 Based on current guidelines, there is no role for screening for thrombophilia disorder. He has completed 6 months of anticoagulation treatment without complications The patient has frequent long distance travel. I recommend consideration for aspirin therapy long-term with 81 mg aspirin. He has history of bleeding ulcer but denies further recent NSAID. I think enteric coated aspirin would be appropriate and for him to take aspirin with meals Finally, at the end of our consultation today, I reinforced the importance of preventive strategies such as avoiding hormonal supplement, avoiding cigarette smoking, keeping up-to-date with screening programs for early cancer detection, frequent ambulation for long distance travel and aggressive DVT  prophylaxis in all surgical settings.  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 There is no role for repeating the blood test as it would not change management He does not need to be on anticoagulation therapy long-term  #3 Elevated PSA I am more concerned about his recent elevated PSA in April 2017. I recommend return appointment to see urologist for further follow-up and to exclude malignancy   All questions were answered. The patient knows to call the clinic with any problems, questions or concerns. I spent 30 minutes counseling the patient face to face. The total time spent in the  appointment was 55 minutes and more than 50% was on counseling.     [place], [place], MD 11/18/2015 2:50 PM

## 2015-11-21 DIAGNOSIS — M791 Myalgia: Secondary | ICD-10-CM | POA: Diagnosis not present

## 2015-11-21 DIAGNOSIS — Q762 Congenital spondylolisthesis: Secondary | ICD-10-CM | POA: Diagnosis not present

## 2015-11-21 DIAGNOSIS — M545 Low back pain: Secondary | ICD-10-CM | POA: Diagnosis not present

## 2015-11-21 DIAGNOSIS — M47816 Spondylosis without myelopathy or radiculopathy, lumbar region: Secondary | ICD-10-CM | POA: Diagnosis not present

## 2015-11-22 DIAGNOSIS — R972 Elevated prostate specific antigen [PSA]: Secondary | ICD-10-CM | POA: Diagnosis not present

## 2015-11-29 DIAGNOSIS — N400 Enlarged prostate without lower urinary tract symptoms: Secondary | ICD-10-CM | POA: Diagnosis not present

## 2015-12-04 DIAGNOSIS — Z564 Discord with boss and workmates: Secondary | ICD-10-CM | POA: Diagnosis not present

## 2015-12-04 DIAGNOSIS — F4310 Post-traumatic stress disorder, unspecified: Secondary | ICD-10-CM | POA: Diagnosis not present

## 2015-12-10 DIAGNOSIS — K4090 Unilateral inguinal hernia, without obstruction or gangrene, not specified as recurrent: Secondary | ICD-10-CM | POA: Diagnosis not present

## 2015-12-10 DIAGNOSIS — S76812A Strain of other specified muscles, fascia and tendons at thigh level, left thigh, initial encounter: Secondary | ICD-10-CM | POA: Diagnosis not present

## 2015-12-10 DIAGNOSIS — W28XXXA Contact with powered lawn mower, initial encounter: Secondary | ICD-10-CM | POA: Diagnosis not present

## 2015-12-18 DIAGNOSIS — F4310 Post-traumatic stress disorder, unspecified: Secondary | ICD-10-CM | POA: Diagnosis not present

## 2015-12-23 DIAGNOSIS — H40023 Open angle with borderline findings, high risk, bilateral: Secondary | ICD-10-CM | POA: Diagnosis not present

## 2015-12-23 DIAGNOSIS — H40031 Anatomical narrow angle, right eye: Secondary | ICD-10-CM | POA: Diagnosis not present

## 2016-01-01 DIAGNOSIS — F4310 Post-traumatic stress disorder, unspecified: Secondary | ICD-10-CM | POA: Diagnosis not present

## 2016-01-03 ENCOUNTER — Ambulatory Visit: Payer: Federal, State, Local not specified - PPO | Admitting: Family

## 2016-01-15 DIAGNOSIS — F4310 Post-traumatic stress disorder, unspecified: Secondary | ICD-10-CM | POA: Diagnosis not present

## 2016-02-12 DIAGNOSIS — F4310 Post-traumatic stress disorder, unspecified: Secondary | ICD-10-CM | POA: Diagnosis not present

## 2016-02-18 DIAGNOSIS — M5416 Radiculopathy, lumbar region: Secondary | ICD-10-CM | POA: Diagnosis not present

## 2016-02-18 DIAGNOSIS — M791 Myalgia: Secondary | ICD-10-CM | POA: Diagnosis not present

## 2016-02-18 DIAGNOSIS — Q762 Congenital spondylolisthesis: Secondary | ICD-10-CM | POA: Diagnosis not present

## 2016-04-01 DIAGNOSIS — F4310 Post-traumatic stress disorder, unspecified: Secondary | ICD-10-CM | POA: Diagnosis not present

## 2016-05-06 DIAGNOSIS — F4310 Post-traumatic stress disorder, unspecified: Secondary | ICD-10-CM | POA: Diagnosis not present

## 2016-05-08 DIAGNOSIS — Z79899 Other long term (current) drug therapy: Secondary | ICD-10-CM | POA: Diagnosis not present

## 2016-05-08 DIAGNOSIS — F4310 Post-traumatic stress disorder, unspecified: Secondary | ICD-10-CM | POA: Diagnosis not present

## 2016-05-13 DIAGNOSIS — M546 Pain in thoracic spine: Secondary | ICD-10-CM | POA: Diagnosis not present

## 2016-05-13 DIAGNOSIS — M545 Low back pain: Secondary | ICD-10-CM | POA: Diagnosis not present

## 2016-05-13 DIAGNOSIS — Z043 Encounter for examination and observation following other accident: Secondary | ICD-10-CM | POA: Diagnosis not present

## 2016-05-13 DIAGNOSIS — M791 Myalgia: Secondary | ICD-10-CM | POA: Diagnosis not present

## 2016-05-15 ENCOUNTER — Ambulatory Visit (INDEPENDENT_AMBULATORY_CARE_PROVIDER_SITE_OTHER): Payer: Federal, State, Local not specified - PPO | Admitting: Family

## 2016-05-15 VITALS — BP 112/88 | HR 107 | Temp 98.0°F | Resp 16 | Wt 210.0 lb

## 2016-05-15 DIAGNOSIS — M47816 Spondylosis without myelopathy or radiculopathy, lumbar region: Secondary | ICD-10-CM | POA: Diagnosis not present

## 2016-05-15 DIAGNOSIS — M62830 Muscle spasm of back: Secondary | ICD-10-CM

## 2016-05-15 DIAGNOSIS — M62838 Other muscle spasm: Secondary | ICD-10-CM | POA: Insufficient documentation

## 2016-05-15 DIAGNOSIS — R519 Headache, unspecified: Secondary | ICD-10-CM | POA: Insufficient documentation

## 2016-05-15 DIAGNOSIS — M5106 Intervertebral disc disorders with myelopathy, lumbar region: Secondary | ICD-10-CM | POA: Diagnosis not present

## 2016-05-15 DIAGNOSIS — M4306 Spondylolysis, lumbar region: Secondary | ICD-10-CM | POA: Diagnosis not present

## 2016-05-15 DIAGNOSIS — Q762 Congenital spondylolisthesis: Secondary | ICD-10-CM | POA: Diagnosis not present

## 2016-05-15 DIAGNOSIS — R51 Headache: Secondary | ICD-10-CM

## 2016-05-15 DIAGNOSIS — G894 Chronic pain syndrome: Secondary | ICD-10-CM | POA: Diagnosis not present

## 2016-05-15 NOTE — Patient Instructions (Addendum)
Thank you for choosing [facility].  SUMMARY AND INSTRUCTIONS:  Ice/moist heat x 20 minutes every 2 hours as needed and after activity.  Stretches and exercises multiple times throughout the day.  Tizanidine (Zanaflex) as needed for muscle spasm.  Tylenol as needed for headaches.   Medication:  Your prescription(s) have been submitted to your pharmacy or been printed and provided for you. Please take as directed and contact our office if you believe you are having problem(s) with the medication(s) or have any questions.    Follow up:  If your symptoms worsen or fail to improve, please contact our office for further instruction, or in case of emergency go directly to the emergency room at the closest medical facility.    Cervical Strain and Sprain Rehab Ask your health care provider which exercises are safe for you. Do exercises exactly as told by your health care provider and adjust them as directed. It is normal to feel mild stretching, pulling, tightness, or discomfort as you do these exercises, but you should stop right away if you feel sudden pain or your pain gets worse.Do not begin these exercises until told by your health care provider. Stretching and range of motion exercises These exercises warm up your muscles and joints and improve the movement and flexibility of your neck. These exercises also help to relieve pain, numbness, and tingling. Exercise A: Cervical side bend 1. Using good posture, sit on a stable chair or stand up. 2. Without moving your shoulders, slowly tilt your left / right ear to your shoulder until you feel a stretch in your neck muscles. You should be looking straight ahead. 3. Hold for __________ seconds. 4. Repeat with the other side of your neck. Repeat __________ times. Complete this exercise __________ times a day. Exercise B: Cervical rotation 1. Using good posture, sit on a stable chair or stand up. 2. Slowly turn your head to the side as  if you are looking over your left / right shoulder.  Keep your eyes level with the ground.  Stop when you feel a stretch along the side and the back of your neck. 3. Hold for __________ seconds. 4. Repeat this by turning to your other side. Repeat __________ times. Complete this exercise __________ times a day. Exercise C: Thoracic extension and pectoral stretch 1. Roll a towel or a small blanket so it is about 4 inches (10 cm) in diameter. 2. Lie down on your back on a firm surface. 3. Put the towel lengthwise, under your spine in the middle of your back. It should not be not under your shoulder blades. The towel should line up with your spine from your middle back to your lower back. 4. Put your hands behind your head and let your elbows fall out to your sides. 5. Hold for __________ seconds. Repeat __________ times. Complete this exercise __________ times a day. Strengthening exercises These exercises build strength and endurance in your neck. Endurance is the ability to use your muscles for a long time, even after your muscles get tired. Exercise D: Upper cervical flexion, isometric 1. Lie on your back with a thin pillow behind your head and a small rolled-up towel under your neck. 2. Gently tuck your chin toward your chest and nod your head down to look toward your feet. Do not lift your head off the pillow. 3. Hold for __________ seconds. 4. Release the tension slowly. Relax your neck muscles completely before you repeat this exercise. Repeat __________ times. Complete  this exercise __________ times a day. Exercise E: Cervical extension, isometric 1. Stand about 6 inches (15 cm) away from a wall, with your back facing the wall. 2. Place a soft object, about 6-8 inches (15-20 cm) in diameter, between the back of your head and the wall. A soft object could be a small pillow, a ball, or a folded towel. 3. Gently tilt your head back and press into the soft object. Keep your jaw and  forehead relaxed. 4. Hold for __________ seconds. 5. Release the tension slowly. Relax your neck muscles completely before you repeat this exercise. Repeat __________ times. Complete this exercise __________ times a day. Posture and body mechanics   Body mechanics refers to the movements and positions of your body while you do your daily activities. Posture is part of body mechanics. Good posture and healthy body mechanics can help to relieve stress in your body's tissues and joints. Good posture means that your spine is in its natural S-curve position (your spine is neutral), your shoulders are pulled back slightly, and your head is not tipped forward. The following are general guidelines for applying improved posture and body mechanics to your everyday activities. Standing  When standing, keep your spine neutral and keep your feet about hip-width apart. Keep a slight bend in your knees. Your ears, shoulders, and hips should line up.  When you do a task in which you stand in one place for a long time, place one foot up on a stable object that is 2-4 inches (5-10 cm) high, such as a footstool. This helps keep your spine neutral. Sitting  When sitting, keep your spine neutral and your keep feet flat on the floor. Use a footrest, if necessary, and keep your thighs parallel to the floor. Avoid rounding your shoulders, and avoid tilting your head forward.  When working at a desk or a computer, keep your desk at a height where your hands are slightly lower than your elbows. Slide your chair under your desk so you are close enough to maintain good posture.  When working at a computer, place your monitor at a height where you are looking straight ahead and you do not have to tilt your head forward or downward to look at the screen. Resting When lying down and resting, avoid positions that are most painful for you. Try to support your neck in a neutral position. You can use a contour pillow or a small  rolled-up towel. Your pillow should support your neck but not push on it. This information is not intended to replace advice given to you by your health care provider. Make sure you discuss any questions you have with your health care provider. Document Released: 04/20/2005 Document Revised: 12/26/2015 Document Reviewed: 03/27/2015 Elsevier Interactive Patient Education  2017 [place] Ask your health care provider which exercises are safe for you. Do exercises exactly as told by your health care provider and adjust them as directed. It is normal to feel mild stretching, pulling, tightness, or discomfort as you do these exercises, but you should stop right away if you feel sudden pain or your pain gets worse. Do not begin these exercises until told by your health care provider. Stretching and range of motion exercises These exercises warm up your muscles and joints and improve the movement and flexibility of your back. These exercises also help to relieve pain, numbness, and tingling. Exercise A: Lumbar rotation 5. Lie on your back on  a firm surface and bend your knees. 6. Straighten your arms out to your sides so each arm forms an "L" shape with a side of your body (a 90 degree angle). 7. Slowly move both of your knees to one side of your body until you feel a stretch in your lower back. Try not to let your shoulders move off of the floor. 8. Hold for __________ seconds. 9. Tense your abdominal muscles and slowly move your knees back to the starting position. 10. Repeat this exercise on the other side of your body. Repeat __________ times. Complete this exercise __________ times a day. Exercise B: Prone extension on elbows 5. Lie on your abdomen on a firm surface. 6. Prop yourself up on your elbows. 7. Use your arms to help lift your chest up until you feel a gentle stretch in your abdomen and your lower back.  This will place some of your body weight on your  elbows. If this is uncomfortable, try stacking pillows under your chest.  Your hips should stay down, against the surface that you are lying on. Keep your hip and back muscles relaxed. 8. Hold for __________ seconds. 9. Slowly relax your upper body and return to the starting position. Repeat __________ times. Complete this exercise __________ times a day. Strengthening exercises These exercises build strength and endurance in your back. Endurance is the ability to use your muscles for a long time, even after they get tired. Exercise C: Pelvic tilt 1. Lie on your back on a firm surface. Bend your knees and keep your feet flat. 2. Tense your abdominal muscles. Tip your pelvis up toward the ceiling and flatten your lower back into the floor.  To help with this exercise, you may place a small towel under your lower back and try to push your back into the towel. 3. Hold for __________ seconds. 4. Let your muscles relax completely before you repeat this exercise. Repeat __________ times. Complete this exercise __________ times a day. Exercise D: Alternating arm and leg raises 1. Get on your hands and knees on a firm surface. If you are on a hard floor, you may want to use padding to cushion your knees, such as an exercise mat. 2. Line up your arms and legs. Your hands should be below your shoulders, and your knees should be below your hips. 3. Lift your left leg behind you. At the same time, raise your right arm and straighten it in front of you.  Do not lift your leg higher than your hip.  Do not lift your arm higher than your shoulder.  Keep your abdominal and back muscles tight.  Keep your hips facing the ground.  Do not arch your back.  Keep your balance carefully, and do not hold your breath. 4. Hold for __________ seconds. 5. Slowly return to the starting position and repeat with your right leg and your left arm. Repeat __________ times. Complete this exercise __________ times a  day. Exercise E: Abdominal set with straight leg raise 1. Lie on your back on a firm surface. 2. Bend one of your knees and keep your other leg straight. 3. Tense your abdominal muscles and lift your straight leg up, 4-6 inches (10-15 cm) off the ground. 4. Keep your abdominal muscles tight and hold for __________ seconds.  Do not hold your breath.  Do not arch your back. Keep it flat against the ground. 5. Keep your abdominal muscles tense as you slowly lower your leg back  to the starting position. 6. Repeat with your other leg. Repeat __________ times. Complete this exercise __________ times a day. Posture and body mechanics   Body mechanics refers to the movements and positions of your body while you do your daily activities. Posture is part of body mechanics. Good posture and healthy body mechanics can help to relieve stress in your body's tissues and joints. Good posture means that your spine is in its natural S-curve position (your spine is neutral), your shoulders are pulled back slightly, and your head is not tipped forward. The following are general guidelines for applying improved posture and body mechanics to your everyday activities. Standing   When standing, keep your spine neutral and your feet about hip-width apart. Keep a slight bend in your knees. Your ears, shoulders, and hips should line up.  When you do a task in which you stand in one place for a long time, place one foot up on a stable object that is 2-4 inches (5-10 cm) high, such as a footstool. This helps keep your spine neutral. Sitting  When sitting, keep your spine neutral and keep your feet flat on the floor. Use a footrest, if necessary, and keep your thighs parallel to the floor. Avoid rounding your shoulders, and avoid tilting your head forward.  When working at a desk or a computer, keep your desk at a height where your hands are slightly lower than your elbows. Slide your chair under your desk so you are  close enough to maintain good posture.  When working at a computer, place your monitor at a height where you are looking straight ahead and you do not have to tilt your head forward or downward to look at the screen. Resting   When lying down and resting, avoid positions that are most painful for you.  If you have pain with activities such as sitting, bending, stooping, or squatting (flexion-based activities), lie in a position in which your body does not bend very much. For example, avoid curling up on your side with your arms and knees near your chest (fetal position).  If you have pain with activities such as standing for a long time or reaching with your arms (extension-based activities), lie with your spine in a neutral position and bend your knees slightly. Try the following positions:  Lying on your side with a pillow between your knees.  Lying on your back with a pillow under your knees. Lifting   When lifting objects, keep your feet at least shoulder-width apart and tighten your abdominal muscles.  Bend your knees and hips and keep your spine neutral. It is important to lift using the strength of your legs, not your back. Do not lock your knees straight out.  Always ask for help to lift heavy or awkward objects. This information is not intended to replace advice given to you by your health care provider. Make sure you discuss any questions you have with your health care provider. Document Released: 04/20/2005 Document Revised: 12/26/2015 Document Reviewed: 01/30/2015 Elsevier Interactive Patient Education  2017 [employer].

## 2016-05-15 NOTE — Progress Notes (Signed)
Pre visit review using our clinic review tool, if applicable. No additional management support is needed unless otherwise documented below in the visit note. 
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
## 2016-05-15 NOTE — Assessment & Plan Note (Signed)
Neurological exam is normal. Headaches most likely associated with mild concussion which appears resolving. Treat conservatively with over-the-counter medications as needed for symptom relief. No red flag symptoms noted. Continue to monitor and follow-up if symptoms do not improve.
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
## 2016-06-08 ENCOUNTER — Encounter: Payer: Self-pay | Admitting: Family

## 2016-06-12 ENCOUNTER — Ambulatory Visit (INDEPENDENT_AMBULATORY_CARE_PROVIDER_SITE_OTHER): Payer: Federal, State, Local not specified - PPO | Admitting: Family

## 2016-06-12 ENCOUNTER — Other Ambulatory Visit (INDEPENDENT_AMBULATORY_CARE_PROVIDER_SITE_OTHER): Payer: Federal, State, Local not specified - PPO

## 2016-06-12 ENCOUNTER — Encounter: Payer: Self-pay | Admitting: Family

## 2016-06-12 VITALS — BP 124/86 | HR 67 | Temp 98.0°F | Resp 16 | Ht 67.0 in | Wt 213.0 lb

## 2016-06-12 DIAGNOSIS — Z0001 Encounter for general adult medical examination with abnormal findings: Secondary | ICD-10-CM

## 2016-06-12 DIAGNOSIS — B351 Tinea unguium: Secondary | ICD-10-CM | POA: Insufficient documentation

## 2016-06-12 DIAGNOSIS — Z0000 Encounter for general adult medical examination without abnormal findings: Secondary | ICD-10-CM | POA: Insufficient documentation

## 2016-06-12 LAB — COMPREHENSIVE METABOLIC PANEL
ALT: 26 U/L (ref 0–53)
AST: 19 U/L (ref 0–37)
Albumin: 4.4 g/dL (ref 3.5–5.2)
Alkaline Phosphatase: 79 U/L (ref 39–117)
BUN: 10 mg/dL (ref 6–23)
CO2: 27 mEq/L (ref 19–32)
Calcium: 9.4 mg/dL (ref 8.4–10.5)
Chloride: 106 mEq/L (ref 96–112)
Creatinine, Ser: 0.98 mg/dL (ref 0.40–1.50)
GFR: 104.38 mL/min (ref >60.00)
Glucose, Bld: 105 mg/dL — ABNORMAL HIGH (ref 70–99)
Potassium: 4.3 mEq/L (ref 3.5–5.1)
Sodium: 140 mEq/L (ref 135–145)
Total Bilirubin: 0.6 mg/dL (ref 0.2–1.2)
Total Protein: 7.1 g/dL (ref 6.0–8.3)

## 2016-06-12 LAB — CBC
HCT: 47.2 % (ref 39.0–52.0)
Hemoglobin: 15.9 g/dL (ref 13.0–17.0)
MCHC: 33.7 g/dL (ref 30.0–36.0)
MCV: 85 fl (ref 78.0–100.0)
Platelets: 236 10*3/uL (ref 150.0–400.0)
RBC: 5.55 Mil/uL (ref 4.22–5.81)
RDW: 16.4 % — ABNORMAL HIGH (ref 11.5–15.5)
WBC: 5.7 10*3/uL (ref 4.0–10.5)

## 2016-06-12 LAB — LIPID PANEL
Cholesterol: 209 mg/dL — ABNORMAL HIGH (ref 0–200)
HDL: 55.1 mg/dL (ref >39.00)
LDL Cholesterol: 128 mg/dL — ABNORMAL HIGH (ref 0–99)
NonHDL: 153.65
Total CHOL/HDL Ratio: 4
Triglycerides: 126 mg/dL (ref 0.0–149.0)
VLDL: 25.2 mg/dL (ref 0.0–40.0)

## 2016-06-12 LAB — PSA: PSA: 0.66 ng/mL (ref 0.10–4.00)

## 2016-06-12 NOTE — Assessment & Plan Note (Signed)
Symptoms and exam consistent with fungal infection located on the right great toenail. Refer to dermatology for further assessment and treatment.

## 2016-06-12 NOTE — Patient Instructions (Addendum)
Thank you for choosing [facility].  SUMMARY AND INSTRUCTIONS:  For health: Eat to Live by Dr. Diona Browner.  For ear cleaning: Debrox or Murine ear cleaning spray.   Headaches should continue to approve.   They will call to schedule your appointment with podiatry.  Medication:  Please continue to take your medication as prescribed.  Labs:  Please stop by the lab on the lower level of the building for your blood work. Your results will be released to [place] [facility] (or called to you) after review, usually within 72 hours after test completion. If any changes need to be made, you will be notified at that same time.  1.) The lab is open from 7:30am to 5:30 pm Monday-Friday 2.) No appointment is necessary 3.) Fasting (if needed) is 6-8 hours after food and drink; black coffee and water are okay    Follow up:  If your symptoms worsen or fail to improve, please contact our office for further instruction, or in case of emergency go directly to the emergency room at the closest medical facility.    Health Maintenance, Male A healthy lifestyle and preventative care can promote health and wellness.  Maintain regular health, dental, and eye exams.  Eat a healthy diet. Foods like vegetables, fruits, whole grains, low-fat dairy products, and lean protein foods contain the nutrients you need and are low in calories. Decrease your intake of foods high in solid fats, added sugars, and salt. Get information about a proper diet from your health care provider, if necessary.  Regular physical exercise is one of the most important things you can do for your health. Most adults should get at least 150 minutes of moderate-intensity exercise (any activity that increases your heart rate and causes you to sweat) each week. In addition, most adults need muscle-strengthening exercises on 2 or more days a week.   Maintain a healthy weight. The body mass index (BMI) is a screening tool to identify  possible weight problems. It provides an estimate of body fat based on height and weight. Your health care provider can find your BMI and can help you achieve or maintain a healthy weight. For males 20 years and older:  A BMI below 18.5 is considered underweight.  A BMI of 18.5 to 24.9 is normal.  A BMI of 25 to 29.9 is considered overweight.  A BMI of 30 and above is considered obese.  Maintain normal blood lipids and cholesterol by exercising and minimizing your intake of saturated fat. Eat a balanced diet with plenty of fruits and vegetables. Blood tests for lipids and cholesterol should begin at age 50 and be repeated every 5 years. If your lipid or cholesterol levels are high, you are over age 72, or you are at high risk for heart disease, you may need your cholesterol levels checked more frequently.Ongoing high lipid and cholesterol levels should be treated with medicines if diet and exercise are not working.  If you smoke, find out from your health care provider how to quit. If you do not use tobacco, do not start.  Lung cancer screening is recommended for adults aged 85-80 years who are at high risk for developing lung cancer because of a history of smoking. A yearly low-dose CT scan of the lungs is recommended for people who have at least a 30-pack-year history of smoking and are current smokers or have quit within the past 15 years. A pack year of smoking is smoking an average of 1 pack of cigarettes  a day for 1 year (for example, a 30-pack-year history of smoking could mean smoking 1 pack a day for 30 years or 2 packs a day for 15 years). Yearly screening should continue until the smoker has stopped smoking for at least 15 years. Yearly screening should be stopped for people who develop a health problem that would prevent them from having lung cancer treatment.  If you choose to drink alcohol, do not have more than 2 drinks per day. One drink is considered to be 12 oz (360 mL) of beer, 5  oz (150 mL) of wine, or 1.5 oz (45 mL) of liquor.  Avoid the use of street drugs. Do not share needles with anyone. Ask for help if you need support or instructions about stopping the use of drugs.  High blood pressure causes heart disease and increases the risk of stroke. High blood pressure is more likely to develop in:  People who have blood pressure in the end of the normal range (100-139/85-89 mm Hg).  People who are overweight or obese.  People who are African American.  If you are 74-54 years of age, have your blood pressure checked every 3-5 years. If you are 67 years of age or older, have your blood pressure checked every year. You should have your blood pressure measured twice-once when you are at a hospital or clinic, and once when you are not at a hospital or clinic. Record the average of the two measurements. To check your blood pressure when you are not at a hospital or clinic, you can use:  An automated blood pressure machine at a pharmacy.  A home blood pressure monitor.  If you are 75-63 years old, ask your health care provider if you should take aspirin to prevent heart disease.  Diabetes screening involves taking a blood sample to check your fasting blood sugar level. This should be done once every 3 years after age 15 if you are at a normal weight and without risk factors for diabetes. Testing should be considered at a younger age or be carried out more frequently if you are overweight and have at least 1 risk factor for diabetes.  Colorectal cancer can be detected and often prevented. Most routine colorectal cancer screening begins at the age of 44 and continues through age 91. However, your health care provider may recommend screening at an earlier age if you have risk factors for colon cancer. On a yearly basis, your health care provider may provide home test kits to check for hidden blood in the stool. A small camera at the end of a tube may be used to directly examine  the colon (sigmoidoscopy or colonoscopy) to detect the earliest forms of colorectal cancer. Talk to your health care provider about this at age 51 when routine screening begins. A direct exam of the colon should be repeated every 5-10 years through age 63, unless early forms of precancerous polyps or small growths are found.  People who are at an increased risk for hepatitis B should be screened for this virus. You are considered at high risk for hepatitis B if:  You were born in a country where hepatitis B occurs often. Talk with your health care provider about which countries are considered high risk.  Your parents were born in a high-risk country and you have not received a shot to protect against hepatitis B (hepatitis B vaccine).  You have HIV or AIDS.  You use needles to inject street drugs.  You  live with, or have sex with, someone who has hepatitis B.  You are a man who has sex with other men (MSM).  You get hemodialysis treatment.  You take certain medicines for conditions like cancer, organ transplantation, and autoimmune conditions.  Hepatitis C blood testing is recommended for all people born from [birth year] through 1964 and any individual with known risk factors for hepatitis C.  Healthy men should no longer receive prostate-specific antigen (PSA) blood tests as part of routine cancer screening. Talk to your health care provider about prostate cancer screening.  Testicular cancer screening is not recommended for adolescents or adult males who have no symptoms. Screening includes self-exam, a health care provider exam, and other screening tests. Consult with your health care provider about any symptoms you have or any concerns you have about testicular cancer.  Practice safe sex. Use condoms and avoid high-risk sexual practices to reduce the spread of sexually transmitted infections (STIs).  You should be screened for STIs, including gonorrhea and chlamydia if:  You are sexually  active and are younger than 24 years.  You are older than 24 years, and your health care provider tells you that you are at risk for this type of infection.  Your sexual activity has changed since you were last screened, and you are at an increased risk for chlamydia or gonorrhea. Ask your health care provider if you are at risk.  If you are at risk of being infected with HIV, it is recommended that you take a prescription medicine daily to prevent HIV infection. This is called pre-exposure prophylaxis (PrEP). You are considered at risk if:  You are a man who has sex with other men (MSM).  You are a heterosexual man who is sexually active with multiple partners.  You take drugs by injection.  You are sexually active with a partner who has HIV.  Talk with your health care provider about whether you are at high risk of being infected with HIV. If you choose to begin PrEP, you should first be tested for HIV. You should then be tested every 3 months for as long as you are taking PrEP.  Use sunscreen. Apply sunscreen liberally and repeatedly throughout the day. You should seek shade when your shadow is shorter than you. Protect yourself by wearing long sleeves, pants, a wide-brimmed hat, and sunglasses year round whenever you are outdoors.  Tell your health care provider of new moles or changes in moles, especially if there is a change in shape or color. Also, tell your health care provider if a mole is larger than the size of a pencil eraser.  A one-time screening for abdominal aortic aneurysm (AAA) and surgical repair of large AAAs by ultrasound is recommended for men aged 27-75 years who are current or former smokers.  Stay current with your vaccines (immunizations). This information is not intended to replace advice given to you by your health care provider. Make sure you discuss any questions you have with your health care provider. Document Released: 10/17/2007 Document Revised: 05/11/2014  Document Reviewed: 01/22/2015 Elsevier Interactive Patient Education  2017 [employer].

## 2016-06-12 NOTE — Progress Notes (Signed)
Subjective:    Patient ID: Anthony [name], male    DOB: February 23, 1966, 51 y.o.   MRN: [id number]  Chief Complaint  Patient presents with  . CPE    fasting, headaches    HPI:  Anthony [name] is a 51 y.o. male who presents today for an annual wellness visit.   1) Health Maintenance -   Diet - Averaging about 2-3 meals per day consisting of a regular diet; Caffeine intake is rare.  Exercise - More in the warm months more sedentary recently   2) Preventative Exams / Immunizations:  Dental -- Due for exam  Vision -- Up to date   Health Maintenance  Topic Date Due  . INFLUENZA VACCINE  08/02/2016 (Originally 12/03/2015)  . TETANUS/TDAP  01/18/2021  . HIV Screening  Completed    Immunization History  Administered Date(s) Administered  . Tdap 01/19/2011     No Known Allergies   Outpatient Medications Prior to Visit  Medication Sig Dispense Refill  . albuterol (PROVENTIL HFA;VENTOLIN HFA) 108 (90 Base) MCG/ACT inhaler Inhale 2 puffs into the lungs every 6 (six) hours as needed. For shortness of breath 1 Inhaler 5  . ARIPiprazole (ABILIFY) 10 MG tablet Take 10 mg by mouth daily.    . Bromfenac Sodium (PROLENSA) 0.07 % SOLN as directed. Reported on 09/07/2015    . chlorhexidine (PERIDEX) 0.12 % solution Use as directed 15 mLs in the mouth or throat 2 (two) times daily. After the symbicort inhaler - swish and spit 120 mL 0  . lidocaine (XYLOCAINE) 2 % solution Use 5 ml po as directed every 2 hours as needed for pain 200 mL 0  . mupirocin ointment (BACTROBAN) 2 % App to skin irritation tid prn 22 g 5  . PARoxetine (PAXIL) 10 MG tablet Take 10 mg by mouth daily.    Marland Kitchen SIMBRINZA 1-0.2 % SUSP     . SYMBICORT 160-4.5 MCG/ACT inhaler     . valACYclovir (VALTREX) 1000 MG tablet Take one half tablet daily 30 tablet 11   No facility-administered medications prior to visit.      Past Medical History:  Diagnosis Date  . Anemia   . Anxiety   . Asthma   . Cataract   . Depression    . DVT (deep venous thrombosis) ([place])   . Fatty liver   . H/O renal calculi 2006  . Hyperlipidemia   . Internal hemorrhoid   . Kidney stones   . PE (pulmonary embolism)   . Peptic ulcer disease   . Pneumonia   . Spondylosis   . Tubular adenoma of colon 2011   Dr Olevia Perches     Past Surgical History:  Procedure Laterality Date  . CATARACT EXTRACTION Left 1996   Dr Elonda Husky; [facility]  . colonoscopy with polypectomy  2011   Dr Olevia Perches, hemorrhoids  . CYSTOSCOPY W/ STONE MANIPULATION  2006  . INGUINAL HERNIA REPAIR Right 1990  . SHOULDER SURGERY Right 12/17/2010   Dr French Ana ; shoulder impingement & torn tendon     Family History  Problem Relation Age of Onset  . Heart attack Mother 73  . Stroke Father 85  . Diabetes Father     PVD  . Glaucoma Father     blindness  . Cancer Paternal Grandmother     ? primary  . Heart attack Maternal Uncle      X2,pre 58  . Heart attack Maternal Grandmother 73  . Asthma Neg Hx   .  COPD Neg Hx      Social History   Social History  . Marital status: Divorced    Spouse name: N/A  . Number of children: 4  . Years of education: 12   Occupational History  . Not on file.   Social History Main Topics  . Smoking status: Never Smoker  . Smokeless tobacco: Never Used  . Alcohol use 0.0 oz/week     Comment: Socially , < 2X/ month  . Drug use: No  . Sexual activity: Not on file     Comment: 2 boys and 2 girls, work at the post office   Other Topics Concern  . Not on file   Social History Narrative   Fun: [employer]     Review of Systems  Constitutional: Denies fever, chills, fatigue, or significant weight gain/loss. HENT: Head: Denies headache or neck pain Ears: Denies changes in hearing, ringing in ears, earache, drainage Nose: Denies discharge, stuffiness, itching, nosebleed, sinus pain Throat: Denies sore throat, hoarseness, dry mouth, sores, thrush Eyes: Denies loss/changes in vision, pain, redness, blurry/double vision, flashing  lights Cardiovascular: Denies chest pain/discomfort, tightness, palpitations, shortness of breath with activity, difficulty lying down, swelling, sudden awakening with shortness of breath Respiratory: Denies shortness of breath, cough, sputum production, wheezing Gastrointestinal: Denies dysphasia, heartburn, change in appetite, nausea, change in bowel habits, rectal bleeding, constipation, diarrhea, yellow skin or eyes Genitourinary: Denies frequency, urgency, burning/pain, blood in urine, incontinence, change in urinary strength. Musculoskeletal: Denies muscle/joint pain, stiffness, back pain, redness or swelling of joints, trauma Skin: Denies rashes, lumps, itching, dryness, color changes, or hair/nail changes Neurological: Denies dizziness, fainting, seizures, weakness, numbness, tingling, tremor Psychiatric - Denies nervousness, stress, depression or memory loss Endocrine: Denies heat or cold intolerance, sweating, frequent urination, excessive thirst, changes in appetite Hematologic: Denies ease of bruising or bleeding     Objective:     BP 124/86 (BP Location: Left Arm, Patient Position: Sitting, Cuff Size: Large)   Pulse 67   Temp 98 F (36.7 C) (Oral)   Resp 16   Ht 5\' 7"  (1.702 m)   Wt 213 lb (96.6 kg)   SpO2 96%   BMI 33.36 kg/m  Nursing note and vital signs reviewed.  Physical Exam  Constitutional: He is oriented to person, place, and time. He appears well-developed and well-nourished.  HENT:  Head: Normocephalic.  Right Ear: Hearing, tympanic membrane, external ear and ear canal normal.  Left Ear: Hearing, tympanic membrane, external ear and ear canal normal.  Nose: Nose normal.  Mouth/Throat: Uvula is midline, oropharynx is clear and moist and mucous membranes are normal.  Eyes: Conjunctivae and EOM are normal. Pupils are equal, round, and reactive to light.  Neck: Neck supple. No JVD present. No tracheal deviation present. No thyromegaly present.  Cardiovascular:  Normal rate, regular rhythm, normal heart sounds and intact distal pulses.   Pulmonary/Chest: Effort normal and breath sounds normal.  Abdominal: Soft. Bowel sounds are normal. He exhibits no distension and no mass. There is no tenderness. There is no rebound and no guarding.  Musculoskeletal: Normal range of motion. He exhibits no edema or tenderness.  Lymphadenopathy:    He has no cervical adenopathy.  Neurological: He is alert and oriented to person, place, and time. He has normal reflexes. No cranial nerve deficit. He exhibits normal muscle tone. Coordination normal.  Skin: Skin is warm and dry.  Psychiatric: He has a normal mood and affect. His behavior is normal. Judgment and thought content normal.  Assessment & Plan:   Problem List Items Addressed This Visit      Musculoskeletal and Integument   Onychomycosis    Symptoms and exam consistent with fungal infection located on the right great toenail. Refer to dermatology for further assessment and treatment.        Other   Encounter for general adult medical examination with abnormal findings - Primary    1) Anticipatory Guidance: Discussed importance of wearing a seatbelt while driving and not texting while driving; changing batteries in smoke detector at least once annually; wearing suntan lotion when outside; eating a balanced and moderate diet; getting physical activity at least 30 minutes per day.  2) Immunizations / Screenings / Labs:  Clines influenza. All other immunizations are up-to-date per recommendations. Obtain PSA for prostate cancer screening. Colon cancer screening completed. Due for a dental exam encouraged to be completed independently. All other screenings are up-to-date per recommendations. Obtain CBC, CMET, and lipid profile.    Overall well exam with risk factors for cardiovascular disease including hyperlipidemia and obesity. Recommend weight loss of 5-10% of current body weight through nutrition and  physical activity. His goal this year is to lose weight by being more physically active and changing his nutrition. Continue other healthy lifestyle behaviors and choices. Follow-up prevention exam in 1 year. Follow-up office visit for chronic conditions pending blood work.      Relevant Orders   CBC (Completed)   Comprehensive metabolic panel (Completed)   Lipid panel (Completed)   PSA (Completed)       I am having Mr. Kihm maintain his SYMBICORT, mupirocin ointment, albuterol, Bromfenac Sodium, SIMBRINZA, ARIPiprazole, PARoxetine, lidocaine, chlorhexidine, and valACYclovir.   Follow-up: Return in about 6 months (around 12/10/2016), or if symptoms worsen or fail to improve.   Mauricio Po, FNP

## 2016-06-12 NOTE — Assessment & Plan Note (Signed)
1) Anticipatory Guidance: Discussed importance of wearing a seatbelt while driving and not texting while driving; changing batteries in smoke detector at least once annually; wearing suntan lotion when outside; eating a balanced and moderate diet; getting physical activity at least 30 minutes per day.  2) Immunizations / Screenings / Labs:  Clines influenza. All other immunizations are up-to-date per recommendations. Obtain PSA for prostate cancer screening. Colon cancer screening completed. Due for a dental exam encouraged to be completed independently. All other screenings are up-to-date per recommendations. Obtain CBC, CMET, and lipid profile.    Overall well exam with risk factors for cardiovascular disease including hyperlipidemia and obesity. Recommend weight loss of 5-10% of current body weight through nutrition and physical activity. His goal this year is to lose weight by being more physically active and changing his nutrition. Continue other healthy lifestyle behaviors and choices. Follow-up prevention exam in 1 year. Follow-up office visit for chronic conditions pending blood work.

## 2016-06-17 ENCOUNTER — Encounter: Payer: Self-pay | Admitting: Family

## 2016-06-18 ENCOUNTER — Encounter: Payer: Self-pay | Admitting: Family

## 2016-06-29 ENCOUNTER — Encounter: Payer: Self-pay | Admitting: Family Medicine

## 2016-06-29 ENCOUNTER — Ambulatory Visit (INDEPENDENT_AMBULATORY_CARE_PROVIDER_SITE_OTHER): Payer: Federal, State, Local not specified - PPO | Admitting: Family Medicine

## 2016-06-29 VITALS — BP 124/80 | HR 80 | Temp 97.8°F | Resp 12 | Ht 67.0 in | Wt 211.0 lb

## 2016-06-29 DIAGNOSIS — R05 Cough: Secondary | ICD-10-CM

## 2016-06-29 DIAGNOSIS — J040 Acute laryngitis: Secondary | ICD-10-CM

## 2016-06-29 DIAGNOSIS — J302 Other seasonal allergic rhinitis: Secondary | ICD-10-CM | POA: Diagnosis not present

## 2016-06-29 DIAGNOSIS — R059 Cough, unspecified: Secondary | ICD-10-CM

## 2016-06-29 DIAGNOSIS — J3089 Other allergic rhinitis: Secondary | ICD-10-CM

## 2016-06-29 DIAGNOSIS — J45909 Unspecified asthma, uncomplicated: Secondary | ICD-10-CM | POA: Diagnosis not present

## 2016-06-29 MED ORDER — ALBUTEROL SULFATE HFA 108 (90 BASE) MCG/ACT IN AERS: 2.0000 | INHALATION_SPRAY | Freq: Four times a day (QID) | RESPIRATORY_TRACT | 1 refills | Status: AC | PRN

## 2016-06-29 MED ORDER — BENZONATATE 100 MG PO CAPS: 200.0000 mg | ORAL_CAPSULE | Freq: Two times a day (BID) | ORAL | 0 refills | Status: AC | PRN

## 2016-06-29 NOTE — Progress Notes (Signed)
HPI:  ACUTE VISIT  Chief Complaint  Patient presents with  . Cough    [name] is a 51 y.o.male here today complaining of 4 days of respiratory symptoms.  Productive cough with yellowish sputum, denies hemoptysis. He is not sure about exacerbating or alleviating factors.  Mild nasal congestion and rhinorrhea. Yesterday started with mild dysphonia. He denies stridor or dysphagia.  Denies fever,chills, and/or body aches. He has not noted chest pain, dyspnea, or wheezing.  No Hx of recent travel. No sick contact. No known insect bite.  Hx of allergies: Yes, Hx of asthma on Symbicort 160-4.5 mcg and Albuterol inh, the latter one he has not used in a while. He also uses a nasal spray,prn, not sure about name, prescribed at the [place].  OTC medications for this problem: No. Hot tea and lemon.  Symptoms otherwise stable.  He denies Hx of GERD or heartburn.   Review of Systems  Constitutional: Negative for activity change, appetite change, chills, fatigue and fever.  HENT: Positive for congestion, rhinorrhea and voice change. Negative for ear pain, mouth sores, postnasal drip, sore throat and trouble swallowing.   Eyes: Negative for discharge, redness and itching.  Respiratory: Positive for cough. Negative for chest tightness, shortness of breath, wheezing and stridor.   Cardiovascular: Negative for chest pain.  Gastrointestinal: Negative for abdominal pain, diarrhea, nausea and vomiting.  Musculoskeletal: Negative for arthralgias, myalgias and neck pain.  Skin: Negative for rash.  Allergic/Immunologic: Positive for environmental allergies.  Neurological: Negative for syncope, weakness and headaches.  Hematological: Negative for adenopathy. Does not bruise/bleed easily.  Psychiatric/Behavioral: Negative for confusion.      Current Outpatient Prescriptions on File Prior to Visit  Medication Sig Dispense Refill  . ARIPiprazole (ABILIFY) 10 MG tablet Take 10  mg by mouth daily.    . Bromfenac Sodium (PROLENSA) 0.07 % SOLN as directed. Reported on 09/07/2015    . chlorhexidine (PERIDEX) 0.12 % solution Use as directed 15 mLs in the mouth or throat 2 (two) times daily. After the symbicort inhaler - swish and spit 120 mL 0  . lidocaine (XYLOCAINE) 2 % solution Use 5 ml po as directed every 2 hours as needed for pain 200 mL 0  . mupirocin ointment (BACTROBAN) 2 % App to skin irritation tid prn 22 g 5  . PARoxetine (PAXIL) 10 MG tablet Take 10 mg by mouth daily.    Marland Kitchen SIMBRINZA 1-0.2 % SUSP     . SYMBICORT 160-4.5 MCG/ACT inhaler     . valACYclovir (VALTREX) 1000 MG tablet Take one half tablet daily 30 tablet 11   No current facility-administered medications on file prior to visit.      Past Medical History:  Diagnosis Date  . Anemia   . Anxiety   . Asthma   . Cataract   . Depression   . DVT (deep venous thrombosis) ([place])   . Fatty liver   . H/O renal calculi 2006  . Hyperlipidemia   . Internal hemorrhoid   . Kidney stones   . PE (pulmonary embolism)   . Peptic ulcer disease   . Pneumonia   . Spondylosis   . Tubular adenoma of colon 2011   Dr Olevia Perches   No Known Allergies  Social History   Social History  . Marital status: Divorced    Spouse name: N/A  . Number of children: 4  . Years of education: 34   Social History Main Topics  . Smoking  status: Never Smoker  . Smokeless tobacco: Never Used  . Alcohol use 0.0 oz/week     Comment: Socially , < 2X/ month  . Drug use: No  . Sexual activity: Not Asked     Comment: 2 boys and 2 girls, work at the post office   Other Topics Concern  . None   Social History Narrative   Fun: [employer]    Vitals:   06/29/16 1436  BP: 124/80  Pulse: 80  Resp: 12  Temp: 97.8 F (36.6 C)  O2 sat 98% at RA. Body mass index is 33.05 kg/m.   Physical Exam  Nursing note and vitals reviewed. Constitutional: He is oriented to person, place, and time. He appears well-developed. He does not  appear ill. No distress.  HENT:  Head: Atraumatic.  Nose: Septal deviation present. No rhinorrhea or sinus tenderness. Right sinus exhibits no maxillary sinus tenderness and no frontal sinus tenderness. Left sinus exhibits no maxillary sinus tenderness and no frontal sinus tenderness.  Mouth/Throat: Oropharynx is clear and moist and mucous membranes are normal.  Hypertrophic turbinates. Post nasal drainage. Minimal dysphonia.  Eyes: EOM are normal. Pupils are equal, round, and reactive to light. Right eye exhibits no discharge. Left eye exhibits no discharge.  Mild conjunctival injection bilateral.  Cardiovascular: Normal rate and regular rhythm.   No murmur heard. Respiratory: Effort normal and breath sounds normal. No stridor. No respiratory distress.  Lymphadenopathy:       Head (right side): No submandibular adenopathy present.       Head (left side): No submandibular adenopathy present.    He has no cervical adenopathy.  Neurological: He is alert and oriented to person, place, and time. He has normal strength. Coordination and gait normal.  Skin: Skin is warm. No rash noted. No erythema.  Psychiatric: He has a normal mood and affect. His speech is normal.  Well groomed, good eye contact.    ASSESSMENT AND PLAN:   Anthony [name] was seen today for cough.  Diagnoses and all orders for this visit:  Laryngitis, acute  Possible etiologies discussed:allergic,viral,GI among some. Voice rest and avoid whispering recommended. Instructed about warning signs. F/U as needed.  Cough  No associated wheezing or systemic symptoms, still can be related with a mild viral URI. Lung auscultation negative. Other possible causes discussed:allergic,asthma,GERD. I do not think imaging is needed today. Instructed about warning signs.  -     benzonatate (TESSALON) 100 MG capsule; Take 2 capsules (200 mg total) by mouth 2 (two) times daily as needed for cough.  Mild asthma without complication,  unspecified whether persistent  No signs of acute exacerbation. Still I recommend Albuterol inh 2 puff every 6 hours for a week then go back to as needed for wheezing or shortness of breath.    Seasonal and perennial allergic rhinitis  He is not sure about nasal spray he is using, ? Intranasal corticosteroid. Recommend using daily. OTC antihistaminic may also help. Nasal irrigations with saline as needed.     -Mr. Anthony [name] was advised to return or notify a doctor immediately if symptoms worsen or persist or new concerns arise.       Anthony [name] G. [place], MD  [facility]. [place] office.

## 2016-06-29 NOTE — Progress Notes (Signed)
Pre visit review using our clinic review tool, if applicable. No additional management support is needed unless otherwise documented below in the visit note. 

## 2016-06-29 NOTE — Patient Instructions (Addendum)
  [name] I have seen you today for an acute visit.  A few things to remember from today's visit:   Cough - Plan: benzonatate (TESSALON) 100 MG capsule  Mild asthma without complication, unspecified whether persistent  Seasonal and perennial allergic rhinitis  Laryngitis, acute    Voice rest.  ? Viral vs allergies.  Albuterol inh 2 puff every 6 hours for a week then as needed for wheezing or shortness of breath.   Monitor for fever or worsening symptoms.     Medications prescribed today are intended for short period of time and will not be refill upon request, a follow up appointment might be necessary to discuss continuation of of treatment if appropriate.     In general please monitor for signs of worsening symptoms and seek immediate medical attention if any concerning.  If symptoms are not resolved in 2-3 weeks you should schedule a follow up appointment with your doctor, before if needed.  Please be sure you have an appointment already scheduled with your PCP before you leave today.

## 2016-07-25 ENCOUNTER — Ambulatory Visit (HOSPITAL_COMMUNITY)
Admission: EM | Admit: 2016-07-25 | Discharge: 2016-07-25 | Disposition: A | Discharge status: Home / Self Care | Payer: Federal, State, Local not specified - PPO

## 2016-07-25 NOTE — ED Notes (Signed)
Patient evaluated by jennifer, np.  Recommended patient go to the ed

## 2016-07-27 ENCOUNTER — Other Ambulatory Visit (INDEPENDENT_AMBULATORY_CARE_PROVIDER_SITE_OTHER): Payer: Federal, State, Local not specified - PPO

## 2016-07-27 ENCOUNTER — Encounter: Payer: Self-pay | Admitting: Family

## 2016-07-27 ENCOUNTER — Ambulatory Visit (INDEPENDENT_AMBULATORY_CARE_PROVIDER_SITE_OTHER)
Admission: RE | Admit: 2016-07-27 | Discharge: 2016-07-27 | Disposition: A | Discharge status: Home / Self Care | Payer: Federal, State, Local not specified - PPO | Source: Ambulatory Visit | Attending: Family | Admitting: Family

## 2016-07-27 ENCOUNTER — Ambulatory Visit (INDEPENDENT_AMBULATORY_CARE_PROVIDER_SITE_OTHER): Payer: Federal, State, Local not specified - PPO | Admitting: Family

## 2016-07-27 DIAGNOSIS — J9811 Atelectasis: Secondary | ICD-10-CM | POA: Diagnosis not present

## 2016-07-27 DIAGNOSIS — R0789 Other chest pain: Secondary | ICD-10-CM | POA: Insufficient documentation

## 2016-07-27 LAB — COMPREHENSIVE METABOLIC PANEL
ALT: 22 U/L (ref 0–53)
AST: 17 U/L (ref 0–37)
Albumin: 4.3 g/dL (ref 3.5–5.2)
Alkaline Phosphatase: 75 U/L (ref 39–117)
BUN: 11 mg/dL (ref 6–23)
CO2: 26 mEq/L (ref 19–32)
Calcium: 9.2 mg/dL (ref 8.4–10.5)
Chloride: 107 mEq/L (ref 96–112)
Creatinine, Ser: 1.03 mg/dL (ref 0.40–1.50)
GFR: 98.5 mL/min (ref >60.00)
Glucose, Bld: 101 mg/dL — ABNORMAL HIGH (ref 70–99)
Potassium: 3.9 mEq/L (ref 3.5–5.1)
Sodium: 140 mEq/L (ref 135–145)
Total Bilirubin: 0.4 mg/dL (ref 0.2–1.2)
Total Protein: 7.1 g/dL (ref 6.0–8.3)

## 2016-07-27 NOTE — Patient Instructions (Signed)
Thank you for choosing [facility].  SUMMARY AND INSTRUCTIONS:  Your EKG looks good.   We will check some blood work and an Publishing rights manager.  Tylenol or ibuprofen as needed for discomfort.   If your symptoms worsen, please let us know.    Labs:  Please stop by the lab on the lower level of the building for your blood work. Your results will be released to [place] (or called to you) after review, usually within 72 hours after test completion. If any changes need to be made, you will be notified at that same time.  1.) The lab is open from 7:30am to 5:30 pm Monday-Friday 2.) No appointment is necessary 3.) Fasting (if needed) is 6-8 hours after food and drink; black coffee and water are okay   Imaging / Radiology:  Please stop by radiology on the basement level of the building for your x-rays. Your results will be released to [place] (or called to you) after review, usually within 72 hours after test completion. If any treatments or changes are necessary, you will be notified at that same time.  Follow up:  If your symptoms worsen or fail to improve, please contact our office for further instruction, or in case of emergency go directly to the emergency room at the closest medical facility.    Chest Wall Pain Chest wall pain is pain in or around the bones and muscles of your chest. Sometimes, an injury causes this pain. Sometimes, the cause may not be known. This pain may take several weeks or longer to get better. Follow these instructions at home: Pay attention to any changes in your symptoms. Take these actions to help with your pain:  Rest as told by your doctor.  Avoid activities that cause pain. Try not to use your chest, belly (abdominal), or side muscles to lift heavy things.  If directed, apply ice to the painful area:  Put ice in a plastic bag.  Place a towel between your skin and the bag.  Leave the ice on for 20 minutes, 2-3 times per day.  Take over-the-counter  and prescription medicines only as told by your doctor.  Do not use tobacco products, including cigarettes, chewing tobacco, and e-cigarettes. If you need help quitting, ask your doctor.  Keep all follow-up visits as told by your doctor. This is important. Contact a doctor if:  You have a fever.  Your chest pain gets worse.  You have new symptoms. Get help right away if:  You feel sick to your stomach (nauseous) or you throw up (vomit).  You feel sweaty or light-headed.  You have a cough with phlegm (sputum) or you cough up blood.  You are short of breath. This information is not intended to replace advice given to you by your health care provider. Make sure you discuss any questions you have with your health care provider. Document Released: 10/07/2007 Document Revised: 09/26/2015 Document Reviewed: 07/16/2014 Elsevier Interactive Patient Education  2017 [employer].

## 2016-07-27 NOTE — Progress Notes (Signed)
Subjective:    Patient ID: Anthony [name], male    DOB: [birth year], 51 y.o.   MRN: [id number]  Chief Complaint  Patient presents with  . Chest Pain    states he has a soreness in his chest, when he lays down in certain positions it is sore, when he laughs it is sore, started on friday    HPI:  Anthony [name] is a 51 y.o. male who  has a past medical history of Anemia; Anxiety; Asthma; Cataract; Depression; DVT (deep venous thrombosis) ([place]); Fatty liver; H/O renal calculi (2006); Hyperlipidemia; Internal hemorrhoid; Kidney stones; PE (pulmonary embolism); Peptic ulcer disease; Pneumonia; Spondylosis; and Tubular adenoma of colon (2011). and presents today for an acute office visit.   This is a new problem. Associated symptom of soreness located in his chest that is worsened with lying down and laughing has been going on for about 3 days. Was seen by Urgent Care with advisement to go to the ED which he did not complete. Soreness goes from the left side of his sternum around his chest. Denies any modifying factors or attempted treatments. Denies any trauma or injury that he is aware of. No recent sickness or illness. Symptoms are mild/moderate. Denies any calf pain.   No Known Allergies    Outpatient Medications Prior to Visit  Medication Sig Dispense Refill  . albuterol (PROVENTIL HFA;VENTOLIN HFA) 108 (90 Base) MCG/ACT inhaler Inhale 2 puffs into the lungs every 6 (six) hours as needed. For shortness of breath 1 Inhaler 1  . ARIPiprazole (ABILIFY) 10 MG tablet Take 10 mg by mouth daily.    . Bromfenac Sodium (PROLENSA) 0.07 % SOLN as directed. Reported on 09/07/2015    . chlorhexidine (PERIDEX) 0.12 % solution Use as directed 15 mLs in the mouth or throat 2 (two) times daily. After the symbicort inhaler - swish and spit 120 mL 0  . lidocaine (XYLOCAINE) 2 % solution Use 5 ml po as directed every 2 hours as needed for pain 200 mL 0  . mupirocin ointment (BACTROBAN) 2 % App to skin  irritation tid prn 22 g 5  . PARoxetine (PAXIL) 10 MG tablet Take 10 mg by mouth daily.    Marland Kitchen SIMBRINZA 1-0.2 % SUSP     . SYMBICORT 160-4.5 MCG/ACT inhaler     . valACYclovir (VALTREX) 1000 MG tablet Take one half tablet daily 30 tablet 11   No facility-administered medications prior to visit.       Past Surgical History:  Procedure Laterality Date  . CATARACT EXTRACTION Left 1996   Dr Elonda Husky; [facility]  . colonoscopy with polypectomy  2011   Dr Olevia Perches, hemorrhoids  . CYSTOSCOPY W/ STONE MANIPULATION  2006  . INGUINAL HERNIA REPAIR Right 1990  . SHOULDER SURGERY Right 12/17/2010   Dr French Ana ; shoulder impingement & torn tendon      Past Medical History:  Diagnosis Date  . Anemia   . Anxiety   . Asthma   . Cataract   . Depression   . DVT (deep venous thrombosis) ([place])   . Fatty liver   . H/O renal calculi 2006  . Hyperlipidemia   . Internal hemorrhoid   . Kidney stones   . PE (pulmonary embolism)   . Peptic ulcer disease   . Pneumonia   . Spondylosis   . Tubular adenoma of colon 2011   Dr Olevia Perches      Review of Systems  Constitutional: Negative for chills and fever.  Eyes:       Negative for changes in vision  Respiratory: Negative for cough, chest tightness, shortness of breath and wheezing.   Cardiovascular: Positive for chest pain. Negative for palpitations and leg swelling.  Neurological: Negative for dizziness, weakness and light-headedness.      Objective:    BP 112/80 (BP Location: Left Arm, Patient Position: Sitting, Cuff Size: Large)   Pulse 77   Temp 97.7 F (36.5 C) (Oral)   Resp 16   Ht 5\' 7"  (1.702 m)   Wt 217 lb (98.4 kg)   SpO2 98%   BMI 33.99 kg/m  Nursing note and vital signs reviewed.  Physical Exam  Constitutional: He is oriented to person, place, and time. He appears well-developed and well-nourished. No distress.  Cardiovascular: Normal rate, regular rhythm, normal heart sounds and intact distal pulses.  Exam reveals no gallop.   No  murmur heard. Pulmonary/Chest: Effort normal and breath sounds normal. No respiratory distress. He has no wheezes. He has no rales. He exhibits no tenderness.  Chest wall - no obvious deformity, discoloration, or edema. Palpable tenderness along third through fifth intercostal spaces. Rib compression test negative.   Neurological: He is alert and oriented to person, place, and time.  Skin: Skin is warm and dry.  Psychiatric: He has a normal mood and affect. His behavior is normal. Judgment and thought content normal.       Assessment & Plan:   Problem List Items Addressed This Visit      Other   Other chest pain    Chest wall pain appears consistent with costochondritis or muscle-skeletal in origin.  Does have significant history for DVT although no calf pain with low suspicision. Will obtain D-Dimer. Obtain x-ray of chest. In office EKG shows normal sinus rhythm. Treat conservatively with OTC medications as needed for symptom relief and supportive care. Follow up if symptoms worsen or do not improve.       Relevant Orders   DG Chest 2 View (Completed)   EKG 12-Lead (Completed)   Comprehensive metabolic panel (Completed)   D-Dimer, Quantitative       I am having Mr. Maisano maintain his SYMBICORT, mupirocin ointment, Bromfenac Sodium, SIMBRINZA, ARIPiprazole, PARoxetine, lidocaine, chlorhexidine, valACYclovir, and albuterol.    Follow-up: No Follow-up on file.  Mauricio Po, FNP

## 2016-07-27 NOTE — Assessment & Plan Note (Addendum)
Chest wall pain appears consistent with costochondritis or muscle-skeletal in origin.  Does have significant history for DVT although no calf pain with low suspicision. Will obtain D-Dimer. Obtain x-ray of chest. In office EKG shows normal sinus rhythm. Treat conservatively with OTC medications as needed for symptom relief and supportive care. Follow up if symptoms worsen or do not improve.

## 2016-07-28 ENCOUNTER — Encounter: Payer: Self-pay | Admitting: Family

## 2016-07-28 LAB — D-DIMER, QUANTITATIVE: D-Dimer, Quant: 0.27 mcg/mL FEU (ref <0.50)

## 2016-08-14 DIAGNOSIS — G894 Chronic pain syndrome: Secondary | ICD-10-CM | POA: Diagnosis not present

## 2016-08-21 DIAGNOSIS — M47816 Spondylosis without myelopathy or radiculopathy, lumbar region: Secondary | ICD-10-CM | POA: Diagnosis not present

## 2016-08-21 DIAGNOSIS — M545 Low back pain: Secondary | ICD-10-CM | POA: Diagnosis not present

## 2016-08-21 DIAGNOSIS — Q762 Congenital spondylolisthesis: Secondary | ICD-10-CM | POA: Diagnosis not present

## 2016-08-21 DIAGNOSIS — M5106 Intervertebral disc disorders with myelopathy, lumbar region: Secondary | ICD-10-CM | POA: Diagnosis not present

## 2016-08-21 DIAGNOSIS — G894 Chronic pain syndrome: Secondary | ICD-10-CM | POA: Diagnosis not present

## 2016-08-26 ENCOUNTER — Emergency Department (HOSPITAL_COMMUNITY)
Admission: EM | Admit: 2016-08-26 | Discharge: 2016-08-26 | Disposition: A | Discharge status: Home / Self Care | Payer: Federal, State, Local not specified - PPO | Attending: Emergency Medicine | Admitting: Emergency Medicine

## 2016-08-26 ENCOUNTER — Encounter: Payer: Self-pay | Admitting: Family

## 2016-08-26 ENCOUNTER — Emergency Department (HOSPITAL_BASED_OUTPATIENT_CLINIC_OR_DEPARTMENT_OTHER)
Admit: 2016-08-26 | Discharge: 2016-08-26 | Disposition: A | Discharge status: Home / Self Care | Payer: Federal, State, Local not specified - PPO

## 2016-08-26 ENCOUNTER — Encounter (HOSPITAL_COMMUNITY): Payer: Self-pay | Admitting: *Deleted

## 2016-08-26 ENCOUNTER — Telehealth: Payer: Self-pay | Admitting: Family

## 2016-08-26 DIAGNOSIS — J45909 Unspecified asthma, uncomplicated: Secondary | ICD-10-CM | POA: Insufficient documentation

## 2016-08-26 DIAGNOSIS — M79609 Pain in unspecified limb: Secondary | ICD-10-CM | POA: Diagnosis not present

## 2016-08-26 DIAGNOSIS — Z79899 Other long term (current) drug therapy: Secondary | ICD-10-CM | POA: Insufficient documentation

## 2016-08-26 DIAGNOSIS — Z86718 Personal history of other venous thrombosis and embolism: Secondary | ICD-10-CM

## 2016-08-26 DIAGNOSIS — M7122 Synovial cyst of popliteal space [Baker], left knee: Secondary | ICD-10-CM | POA: Insufficient documentation

## 2016-08-26 DIAGNOSIS — M25562 Pain in left knee: Secondary | ICD-10-CM | POA: Diagnosis present

## 2016-08-26 NOTE — ED Notes (Signed)
Pt changed into gown. Pt reports bilateral leg pain and tightness.  No visible swelling noted.  Waiting on doppler

## 2016-08-26 NOTE — ED Provider Notes (Signed)
[facility] DEPT Provider Note   CSN: [id number] Arrival date & time: 08/26/16  1117  History   Chief Complaint Chief Complaint  Patient presents with  . DVT    rule out dvt   HPI Comments:  Anthony [name] is a 51 y.o. male with a PMHx of DVT and PE, on ASA, who presents to the Emergency Department complaining of worsening tightness in bilateral legs x1 day, here to r/o DVT. Pain is worse on the posterior left knee and only present when pt is ambulating. 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 Pt reports a 2.5 hour car trip (5 hours round-trip) 2 days ago. Pt reports that this pain feels "kind-of, sort-of" similar to his previous DVT in the left calf. He states he has not tried anything for his symptoms.   Pt was taken off of Eliquis last year. He denies experiencing any chest pain, leg swelling or shortness of breath. No hx of cancer and no hormone use.   The history is provided by the patient and medical records. No language interpreter was used.   Past Medical History:  Diagnosis Date  . Anemia   . Anxiety   . Asthma   . Cataract   . Depression   . DVT (deep venous thrombosis) ([place])   . Fatty liver   . H/O renal calculi 2006  . Hyperlipidemia   . Internal hemorrhoid   . Kidney stones   . PE (pulmonary embolism)   . Peptic ulcer disease   . Pneumonia   . Spondylosis   . Tubular adenoma of colon 2011   Dr Olevia Perches    Patient Active Problem List   Diagnosis Date Noted  . Other chest pain 07/27/2016  . Onychomycosis 06/12/2016  . Encounter for general adult medical examination with abnormal findings 06/12/2016  . Generalized headache 05/15/2016  . Lumbar paraspinal muscle spasm 05/15/2016  . Cervical paraspinal muscle spasm 05/15/2016  . History of DVT (deep vein thrombosis) 11/18/2015  . Elevated PSA 11/18/2015  . Acute pulmonary embolism ([place]) 2016 05/09/2015  . Peptic ulcer 05/09/2015  . Dyspnea 05/02/2015  .  Acute upper respiratory infection 05/02/2015  . DVT (deep venous thrombosis) ([place]) 04/26/2015  . Duodenal ulcer 04/25/2015  . Positive D dimer 04/23/2015  . MRSA infection 09/14/2013  . Spondylosis of lumbar joint 09/28/2012  . Fatty liver disease, nonalcoholic 40/98/1191  . Seasonal and perennial allergic rhinitis 10/01/2011  . Family history of ischemic heart disease 04/02/2011  . 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 COLONIC POLYPS, HX OF 06/21/2009  . HYPERLIPIDEMIA 02/16/2008  . Allergic-infective asthma 02/16/2008  . NONSPECIFIC ABNORMAL ELECTROCARDIOGRAM 02/16/2008  . NEPHROLITHIASIS, HX OF 02/16/2008    Past Surgical History:  Procedure Laterality Date  . CATARACT EXTRACTION Left 1996   Dr Elonda Husky; [facility]  . colonoscopy with polypectomy  2011   Dr Olevia Perches, hemorrhoids  . CYSTOSCOPY W/ STONE MANIPULATION  2006  . INGUINAL HERNIA REPAIR Right 1990  . SHOULDER SURGERY Right 12/17/2010   Dr French Ana ; shoulder impingement & torn tendon     Home Medications    Prior to Admission medications   Medication Sig Start Date End Date Taking? Authorizing Provider  ARIPiprazole (ABILIFY) 10 MG tablet Take 10 mg by mouth daily as needed (anxiety).    Yes Historical Provider, MD  Brinzolamide-Brimonidine 1-0.2 % SUSP Place 1 drop into both eyes 2 (two) times daily.   Yes Historical  Provider, MD  fluticasone (FLONASE) 50 MCG/ACT nasal spray Place 2 sprays into both nostrils daily as needed for allergies. 07/10/16  Yes Historical Provider, MD  predniSONE (STERAPRED UNI-PAK 21 TAB) 5 MG (21) TBPK tablet Take 5 mg by mouth See admin instructions. 6-5-4-3-2-1  Prednisone taper 08/21/16  Yes Historical Provider, MD  SYMBICORT 160-4.5 MCG/ACT inhaler Inhale 2 puffs into the lungs daily as needed (shortness of breath).  10/26/14  Yes Historical Provider, MD  valACYclovir (VALTREX) 1000 MG tablet Take one half tablet daily 09/11/15  Yes Darlyne Russian, MD  albuterol (PROVENTIL HFA;VENTOLIN HFA) 108 (90  Base) MCG/ACT inhaler Inhale 2 puffs into the lungs every 6 (six) hours as needed. For shortness of breath 06/29/16   Betty G Martinique, MD  Bromfenac Sodium (PROLENSA) 0.07 % SOLN as directed. Reported on 09/07/2015 11/19/14   Historical Provider, MD  chlorhexidine (PERIDEX) 0.12 % solution Use as directed 15 mLs in the mouth or throat 2 (two) times daily. After the symbicort inhaler - swish and spit Patient not taking: Reported on 08/26/2016 09/07/15   Shawnee Knapp, MD  lidocaine (XYLOCAINE) 2 % solution Use 5 ml po as directed every 2 hours as needed for pain Patient not taking: Reported on 08/26/2016 09/07/15   Shawnee Knapp, MD  mupirocin ointment (BACTROBAN) 2 % App to skin irritation tid prn Patient not taking: Reported on 08/26/2016 03/09/15   [name], MD  PARoxetine (PAXIL) 10 MG tablet Take 10 mg by mouth daily.    Historical Provider, MD   Family History Family History  Problem Relation Age of Onset  . Heart attack Mother 20  . Stroke Father 35  . Diabetes Father     PVD  . Glaucoma Father     blindness  . Cancer Paternal Grandmother     ? primary  . Heart attack Maternal Uncle      X2,pre 24  . Heart attack Maternal Grandmother 73  . Asthma Neg Hx   . COPD Neg Hx    Social History Social History  Substance Use Topics  . Smoking status: Never Smoker  . Smokeless tobacco: Never Used  . Alcohol use 0.0 oz/week     Comment: Socially , < 2X/ month   Allergies   Aleve [naproxen]  Review of Systems Review of Systems  Constitutional: Negative for appetite change and fever.  HENT: Negative for congestion.   Respiratory: Negative for cough and shortness of breath.   Cardiovascular: Negative for chest pain and leg swelling.  Gastrointestinal: Negative for abdominal pain and diarrhea.  Musculoskeletal: Positive for arthralgias and myalgias. Negative for back pain.       Bilateral leg "tightness."  Skin: Negative for rash and wound.   Physical Exam Updated Vital Signs BP (!)  136/93   Pulse 95   Temp 97.6 F (36.4 C) (Oral)   Resp 16   SpO2 98%   Physical Exam  Constitutional: He appears well-developed and well-nourished.  Well appearing  HENT:  Head: Normocephalic and atraumatic.  Nose: Nose normal.  Eyes: Conjunctivae and EOM are normal.  Neck: Normal range of motion.  Cardiovascular: Normal rate, normal heart sounds and intact distal pulses.   No murmur heard. 2+ distal pulses.  Pulmonary/Chest: Effort normal and breath sounds normal. No respiratory distress. He has no wheezes. He has no rales.  Normal work of breathing. No respiratory distress noted.   Abdominal: Soft.  Musculoskeletal: Normal range of motion.  No redness, swelling, deformity or  wounds noted to lower legs bilaterally. Minimal TTP over popliteal region to left leg. Sensation intact to BLE. Muscle strength 5/5 BLE. Good against resistance to hip and knee flexion and extension. Negative Honan's sign bilaterally. No calf tenderness on palpation bilaterally  Neurological: He is alert.  Skin: Skin is warm.  Psychiatric: He has a normal mood and affect. His behavior is normal.  Nursing note and vitals reviewed.  ED Treatments / Results  DIAGNOSTIC STUDIES:  Oxygen Saturation is 98% on room air, normal by my interpretation.    COORDINATION OF CARE:  12:15 PM Discussed treatment plan with pt at bedside and pt agreed to plan.   Labs (all labs ordered are listed, but only abnormal results are displayed) Labs Reviewed - No data to display  EKG  EKG Interpretation None       Radiology No results found.  Procedures Procedures (including critical care time)  Medications Ordered in ED Medications - No data to display   Initial Impression / Assessment and Plan / ED Course  I have reviewed the triage vital signs and the nursing notes.  Pertinent labs & imaging results that were available during my care of the patient were reviewed by me and considered in my medical decision  making (see chart for details).     Patient here with apparent baker cyst on ultrasound. Ultrasound was negative for DVT bilaterally. Low suspicion for DVT or PE at this time.  Well's criteria for PE at 1.5 points at low risk criteria. Patient here with no chest pain, shortness of breath, satting at 98%, not tachypneic, not tachycardic. Heart and lung sounds are clear. Patient will be given instruction to follow up with his primary care provider in 1-2 weeks regarding today's visit. Symptomatic treatment with Tylenol and rest, ice, compression, elevation discussed. Reasons to immediately return to ED discussed. Patient verbalizes understanding and is agreeable with assessment and plan.    Final Clinical Impressions(s) / ED Diagnoses   Final diagnoses:  Synovial cyst of left popliteal space    New Prescriptions New Prescriptions   No medications on file   By signing my name below, I, Levester Fresh, attest that this documentation has been prepared under the direction and in the presence of Anthony [name], [place]. Electronically Signed: Levester Fresh, Scribe. 08/26/2016. 2:08 PM.  I personally performed the services described in this documentation, which was scribed in my presence. The recorded information has been reviewed and is accurate.   [place], [place] 08/26/16 [place], [place] 08/26/16 1418    Varney Biles, MD 08/27/16 1757

## 2016-08-26 NOTE — Progress Notes (Signed)
*  PRELIMINARY RESULTS* Vascular Ultrasound Bilateral lower extremity venous duplex has been completed.  Preliminary findings: No evidence of deep vein thrombosis in the visualized veins of the lower extremities.  Small bakers cyst (2.3 x 2.5cm) seen in the left popliteal fossa.  Preliminary results given to [facility] @ 13:50.   Everrett Coombe 08/26/2016, 1:58 PM

## 2016-08-26 NOTE — ED Triage Notes (Signed)
Pt with hx of DVT in left calf in the past (was treated with elaquis) is here for tightness in bilateral legs that has been increasing since last pm.  Pt states that Monday he drove about 4.5-5hours round trip.  Pt denies any sob or CP with this.  Pt is ambulatory

## 2016-08-26 NOTE — Discharge Instructions (Signed)
Please follow-up with your primary care provider in 1-2 weeks regarding today's visit. He is cold compresses to the area. Rest, ice, compress, elevate left knee. Take Tylenol as needed for pain relief.  Contact a health care provider if: You have knee pain, stiffness, or swelling that does not get better. Get help right away if: You have sudden or worsening pain and swelling in your calf area.

## 2016-08-26 NOTE — Telephone Encounter (Signed)
Patient Name: Anthony [name] DOB: 1965/08/08 Initial Comment Caller states he was given a medication to take for inflammation in his legs and states he is needing clarification on symptoms for taking the medicine. States his legs are sore. Nurse Assessment Nurse: Erling Cruz, RN, [name] Date/Time (Eastern Time): 08/26/2016 10:16:04 AM Confirm and document reason for call. If symptomatic, describe symptoms. ---Caller states he was given a medication (Prednisone) to take for inflammation in his legs and states he is needing clarification on symptoms for taking the medicine. States his legs are sore & tight, warm. Not hot to the touch, swollen or red. Went on 2hr car trip on Monday. Takes low dose ASA daily. Was on Eliquis but has since stopped in June of 2017. Does the patient have any new or worsening symptoms? ---Yes Will a triage be completed? ---Yes Related visit to physician within the last 2 weeks? ---Yes Does the PT have any chronic conditions? (i.e. diabetes, asthma, etc.) ---Yes List chronic conditions. ---Asthma, DVT in Dec 2016 Is this a behavioral health or substance abuse call? ---No Guidelines Guideline Title Affirmed Question Affirmed Notes Leg Pain History of prior "blood clot" in leg or lungs (i.e., deep vein thrombosis, pulmonary embolism) Final Disposition User See Physician within 4 Hours (or PCP triage) Love, RN, [place] with office. Gave report. Staff member spoke with Dr. Elna Breslow & he stated for patient to go to ED. Called patient. Advised of Dr. Purcell Nails advice to go to ED. Caller states he will go after he takes a nap. Encouraged patient to go now & explained consequences of potential blood clot/DVT. Caller states he understands but will still go later. Referrals REFERRED TO PCP OFFICE Disagree/Comply: Comply

## 2016-09-11 DIAGNOSIS — H527 Unspecified disorder of refraction: Secondary | ICD-10-CM | POA: Diagnosis not present

## 2016-09-11 DIAGNOSIS — H02402 Unspecified ptosis of left eyelid: Secondary | ICD-10-CM | POA: Diagnosis not present

## 2016-09-11 DIAGNOSIS — H4032X4 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, left eye, indeterminate stage: Secondary | ICD-10-CM | POA: Diagnosis not present

## 2016-09-11 DIAGNOSIS — H2511 Age-related nuclear cataract, right eye: Secondary | ICD-10-CM | POA: Diagnosis not present

## 2016-09-15 DIAGNOSIS — M545 Low back pain: Secondary | ICD-10-CM | POA: Diagnosis not present

## 2016-09-15 DIAGNOSIS — R5383 Other fatigue: Secondary | ICD-10-CM | POA: Diagnosis not present

## 2016-09-15 DIAGNOSIS — E669 Obesity, unspecified: Secondary | ICD-10-CM | POA: Diagnosis not present

## 2016-09-15 DIAGNOSIS — R51 Headache: Secondary | ICD-10-CM | POA: Diagnosis not present

## 2016-09-15 DIAGNOSIS — J45909 Unspecified asthma, uncomplicated: Secondary | ICD-10-CM | POA: Diagnosis not present

## 2016-09-15 DIAGNOSIS — Z713 Dietary counseling and surveillance: Secondary | ICD-10-CM | POA: Diagnosis not present

## 2016-09-29 DIAGNOSIS — H524 Presbyopia: Secondary | ICD-10-CM | POA: Diagnosis not present

## 2016-09-29 DIAGNOSIS — Z961 Presence of intraocular lens: Secondary | ICD-10-CM | POA: Diagnosis not present

## 2016-09-29 DIAGNOSIS — H2511 Age-related nuclear cataract, right eye: Secondary | ICD-10-CM | POA: Diagnosis not present

## 2016-09-29 DIAGNOSIS — H40023 Open angle with borderline findings, high risk, bilateral: Secondary | ICD-10-CM | POA: Diagnosis not present

## 2016-09-30 DIAGNOSIS — H524 Presbyopia: Secondary | ICD-10-CM | POA: Diagnosis not present

## 2016-09-30 DIAGNOSIS — H2511 Age-related nuclear cataract, right eye: Secondary | ICD-10-CM | POA: Diagnosis not present

## 2016-10-01 DIAGNOSIS — M47896 Other spondylosis, lumbar region: Secondary | ICD-10-CM | POA: Diagnosis not present

## 2016-10-08 DIAGNOSIS — M791 Myalgia: Secondary | ICD-10-CM | POA: Diagnosis not present

## 2016-10-08 DIAGNOSIS — G894 Chronic pain syndrome: Secondary | ICD-10-CM | POA: Diagnosis not present

## 2016-10-08 DIAGNOSIS — M4306 Spondylolysis, lumbar region: Secondary | ICD-10-CM | POA: Diagnosis not present

## 2016-10-12 ENCOUNTER — Encounter: Payer: Self-pay | Admitting: Family

## 2016-10-17 ENCOUNTER — Encounter: Payer: Self-pay | Admitting: Family

## 2016-10-27 ENCOUNTER — Other Ambulatory Visit: Payer: Self-pay | Admitting: Emergency Medicine

## 2016-10-27 DIAGNOSIS — M25551 Pain in right hip: Secondary | ICD-10-CM | POA: Diagnosis not present

## 2016-10-29 DIAGNOSIS — M545 Low back pain: Secondary | ICD-10-CM | POA: Diagnosis not present

## 2016-10-29 DIAGNOSIS — R262 Difficulty in walking, not elsewhere classified: Secondary | ICD-10-CM | POA: Diagnosis not present

## 2016-10-29 DIAGNOSIS — M79604 Pain in right leg: Secondary | ICD-10-CM | POA: Diagnosis not present

## 2016-11-05 DIAGNOSIS — Z0100 Encounter for examination of eyes and vision without abnormal findings: Secondary | ICD-10-CM | POA: Diagnosis not present

## 2016-11-05 DIAGNOSIS — Z713 Dietary counseling and surveillance: Secondary | ICD-10-CM | POA: Diagnosis not present

## 2016-11-05 DIAGNOSIS — E669 Obesity, unspecified: Secondary | ICD-10-CM | POA: Diagnosis not present

## 2016-11-05 DIAGNOSIS — Z6832 Body mass index (BMI) 32.0-32.9, adult: Secondary | ICD-10-CM | POA: Diagnosis not present

## 2016-11-13 ENCOUNTER — Encounter: Payer: Self-pay | Admitting: Internal Medicine

## 2016-11-13 ENCOUNTER — Ambulatory Visit (INDEPENDENT_AMBULATORY_CARE_PROVIDER_SITE_OTHER): Payer: Federal, State, Local not specified - PPO | Admitting: Internal Medicine

## 2016-11-13 ENCOUNTER — Ambulatory Visit (INDEPENDENT_AMBULATORY_CARE_PROVIDER_SITE_OTHER)
Admission: RE | Admit: 2016-11-13 | Discharge: 2016-11-13 | Disposition: A | Discharge status: Home / Self Care | Payer: Federal, State, Local not specified - PPO | Source: Ambulatory Visit | Attending: Internal Medicine | Admitting: Internal Medicine

## 2016-11-13 VITALS — BP 124/84 | HR 63 | Ht 67.0 in | Wt 207.0 lb

## 2016-11-13 DIAGNOSIS — M79644 Pain in right finger(s): Secondary | ICD-10-CM

## 2016-11-13 MED ORDER — DICLOFENAC SODIUM 1 % TD GEL: 4.0000 g | Freq: Four times a day (QID) | TRANSDERMAL | 2 refills | Status: DC | PRN

## 2016-11-13 NOTE — Progress Notes (Signed)
Subjective:    Patient ID: Anthony [name], male    DOB: 04/27/1966, 51 y.o.   MRN: [id number]  HPI  Here to f/u with c/o right thumb pain x 4-5 days; started while at work but not clear is due to work related overuses or accident.  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  Pain is mild tio mod, constant, sharp, and worse to flex the thumb at the DIP and the [facility].  Has point tenderness to these two areas specifically the palmar aspect of the [facility] and DIP.  No redness, swelling, cyanosis or other skin change.  Denies neurological symptoms such as numbness or weakness/loss of grip strength.   Pt denies fever, wt loss, night sweats, loss of appetite, or other constitutional symptoms No specific trauma he can recall Past Medical History:  Diagnosis Date  . Anemia   . Anxiety   . Asthma   . Cataract   . Depression   . DVT (deep venous thrombosis) ([place])   . Fatty liver   . H/O renal calculi 2006  . Hyperlipidemia   . Internal hemorrhoid   . Kidney stones   . PE (pulmonary embolism)   . Peptic ulcer disease   . Pneumonia   . Spondylosis   . Tubular adenoma of colon 2011   Dr Olevia Perches   Past Surgical History:  Procedure Laterality Date  . CATARACT EXTRACTION Left 1996   Dr Elonda Husky; [facility]  . colonoscopy with polypectomy  2011   Dr Olevia Perches, hemorrhoids  . CYSTOSCOPY W/ STONE MANIPULATION  2006  . INGUINAL HERNIA REPAIR Right 1990  . SHOULDER SURGERY Right 12/17/2010   Dr French Ana ; shoulder impingement & torn tendon    reports that he has never smoked. He has never used smokeless tobacco. He reports that he drinks alcohol. He reports that he does not use drugs. family history includes Cancer in his paternal grandmother; Diabetes in his father; Glaucoma in his father; Heart attack in his maternal uncle; Heart attack (age of onset: 57) in his mother; Heart attack (age of onset: 44) in his maternal grandmother; Stroke  (age of onset: 27) in his father. Allergies  Allergen Reactions  . Aleve [Naproxen] Rash    Patient stated it was years ago   Current Outpatient Prescriptions on File Prior to Visit  Medication Sig Dispense Refill  . albuterol (PROVENTIL HFA;VENTOLIN HFA) 108 (90 Base) MCG/ACT inhaler Inhale 2 puffs into the lungs every 6 (six) hours as needed. For shortness of breath 1 Inhaler 1  . ARIPiprazole (ABILIFY) 10 MG tablet Take 10 mg by mouth daily as needed (anxiety).     . Brinzolamide-Brimonidine 1-0.2 % SUSP Place 1 drop into both eyes 2 (two) times daily.    . Bromfenac Sodium (PROLENSA) 0.07 % SOLN as directed. Reported on 09/07/2015    . chlorhexidine (PERIDEX) 0.12 % solution Use as directed 15 mLs in the mouth or throat 2 (two) times daily. After the symbicort inhaler - swish and spit 120 mL 0  . fluticasone (FLONASE) 50 MCG/ACT nasal spray Place 2 sprays into both nostrils daily as needed for allergies.    Marland Kitchen lidocaine (XYLOCAINE) 2 % solution Use 5 ml po as directed every 2 hours as needed for pain 200 mL 0  . mupirocin ointment (BACTROBAN) 2 % App to skin irritation tid prn 22 g 5  . PARoxetine (PAXIL) 10 MG tablet  Take 10 mg by mouth daily.    . predniSONE (STERAPRED UNI-PAK 21 TAB) 5 MG (21) TBPK tablet Take 5 mg by mouth See admin instructions. 6-5-4-3-2-1  Prednisone taper  0  . SYMBICORT 160-4.5 MCG/ACT inhaler Inhale 2 puffs into the lungs daily as needed (shortness of breath).     . valACYclovir (VALTREX) 1000 MG tablet TAKE 1/2 TABLET BY MOUTH DAILY 30 tablet 0   No current facility-administered medications on file prior to visit.    Review of Systems All otherwise neg per pt    Objective:   Physical Exam BP 124/84   Pulse 63   Ht 5\' 7"  (1.702 m)   Wt 207 lb (93.9 kg)   SpO2 98%   BMI 32.42 kg/m  VS noted, not ill appearing Constitutional: Pt appears in NAD HENT: Head: NCAT.  Right Ear: External ear normal.  Left Ear: External ear normal.  Eyes: . Pupils are equal,  round, and reactive to light. Conjunctivae and EOM are normal Nose: without d/c or deformity Neck: Neck supple. Gross normal ROM Cardiovascular: Normal rate and regular rhythm.   Pulmonary/Chest: Effort normal and breath sounds without rales or wheezing.  Right thumb with point tenderness at the palmar aspect of the [facility] and DIP without swelling or erythema, and no specific joint effusions or other tenderness, no associated skin change Neurological: Pt is alert. At baseline orientation, motor 5/5 intact, hand with normal grip and o/w neurovasc intact Skin: Skin is warm. No rashes, other new lesions, no LE edema Psychiatric: Pt behavior is normal without agitation  No other exam findings    Assessment & Plan:

## 2016-11-13 NOTE — Patient Instructions (Signed)
Please take all new medication as prescribed - the voltaren gel topical pain medication  Please continue all other medications as before, and refills have been done if requested.  Please have the pharmacy call with any other refills you may need.  Please keep your appointments with your specialists as you may have planned  Please go to the XRAY Department in the Basement (go straight as you get off the elevator) for the x-ray testing  You will be contacted by phone if any changes need to be made immediately.  Otherwise, you will receive a letter about your results with an explanation, but please check with MyChart first.  Please remember to sign up for MyChart if you have not done so, as this will be important to you in the future with finding out test results, communicating by private email, and scheduling acute appointments online when needed.

## 2016-11-14 ENCOUNTER — Encounter: Payer: Self-pay | Admitting: Family

## 2016-11-15 NOTE — Assessment & Plan Note (Signed)
Etiology unclear, but suspect tendonitis with tender areas at the insertion sites at the [facility] and DIP; overall mild, can still work by simply using the left hand as neither hand is used frequently, and is not clearly a workers comp injury.  Thumb is o/w neurovasc intact.  For plain film to r/o arthritis, but suspect tendonitis.  For voltaren gel prn, and refer to Sports med in this office or hand surgury if not improved or worsens

## 2016-11-16 ENCOUNTER — Encounter: Payer: Self-pay | Admitting: Family

## 2016-11-16 ENCOUNTER — Encounter: Payer: Self-pay | Admitting: Internal Medicine

## 2016-11-17 DIAGNOSIS — G478 Other sleep disorders: Secondary | ICD-10-CM | POA: Diagnosis not present

## 2016-11-17 DIAGNOSIS — G4730 Sleep apnea, unspecified: Secondary | ICD-10-CM | POA: Diagnosis not present

## 2016-11-23 ENCOUNTER — Encounter: Payer: Self-pay | Admitting: Family

## 2016-11-23 DIAGNOSIS — M79644 Pain in right finger(s): Secondary | ICD-10-CM

## 2016-11-27 DIAGNOSIS — M65311 Trigger thumb, right thumb: Secondary | ICD-10-CM | POA: Diagnosis not present

## 2016-12-02 ENCOUNTER — Other Ambulatory Visit (INDEPENDENT_AMBULATORY_CARE_PROVIDER_SITE_OTHER): Payer: Federal, State, Local not specified - PPO

## 2016-12-02 ENCOUNTER — Encounter: Payer: Self-pay | Admitting: Family

## 2016-12-02 ENCOUNTER — Encounter: Payer: Self-pay | Admitting: Family Medicine

## 2016-12-02 ENCOUNTER — Ambulatory Visit (INDEPENDENT_AMBULATORY_CARE_PROVIDER_SITE_OTHER): Payer: Federal, State, Local not specified - PPO | Admitting: Family Medicine

## 2016-12-02 VITALS — BP 140/80 | HR 88 | Temp 97.8°F | Ht 67.0 in | Wt 212.0 lb

## 2016-12-02 DIAGNOSIS — R300 Dysuria: Secondary | ICD-10-CM | POA: Insufficient documentation

## 2016-12-02 DIAGNOSIS — B009 Herpesviral infection, unspecified: Secondary | ICD-10-CM | POA: Insufficient documentation

## 2016-12-02 LAB — URINALYSIS, ROUTINE W REFLEX MICROSCOPIC
Bilirubin Urine: NEGATIVE
Ketones, ur: NEGATIVE
Leukocytes, UA: NEGATIVE
Nitrite: NEGATIVE
Specific Gravity, Urine: 1.01 (ref 1.000–1.030)
Total Protein, Urine: NEGATIVE
Urine Glucose: NEGATIVE
Urobilinogen, UA: 0.2 (ref 0.0–1.0)
WBC, UA: NONE SEEN (ref 0–?)
pH: 6 (ref 5.0–8.0)

## 2016-12-02 LAB — POCT URINALYSIS DIPSTICK
Bilirubin, UA: NEGATIVE
Blood, UA: 10
Glucose, UA: NEGATIVE
Ketones, UA: NEGATIVE
Leukocytes, UA: NEGATIVE
Nitrite, UA: NEGATIVE
Protein, UA: NEGATIVE
Spec Grav, UA: 1.025 (ref 1.010–1.025)
Urobilinogen, UA: 0.2 E.U./dL
pH, UA: 6 (ref 5.0–8.0)

## 2016-12-02 LAB — HIV ANTIBODY (ROUTINE TESTING W REFLEX): HIV 1&2 Ab, 4th Generation: NONREACTIVE

## 2016-12-02 LAB — PSA: PSA: 0.47 ng/mL (ref 0.10–4.00)

## 2016-12-02 MED ORDER — VALACYCLOVIR HCL 500 MG PO TABS: 500.0000 mg | ORAL_TABLET | Freq: Two times a day (BID) | ORAL | 0 refills | Status: DC

## 2016-12-02 NOTE — Patient Instructions (Signed)
Thank you for coming in,   We will call you with results from today. I sent in Valtrex to take for 3 days. Please let me know if her symptoms worsen or do not improve.   Please feel free to call with any questions or concerns at any time, at [phone]. --Dr. Raeford Razor

## 2016-12-02 NOTE — Assessment & Plan Note (Signed)
Has a history of a culture showing type II herpes. Possible he is having a flare. Does not have several lesions but one lesion on his glans penis. - Initiate Valtrex for recurrent symptoms. - HIV, RPR, GC, chlamydia

## 2016-12-02 NOTE — Assessment & Plan Note (Signed)
He has a history of hematuria and urinary tract infection that grew greater 100,000 enterococcus. He has a history of elevated PSA and has followed up with urologist the reports having was normal. This doesn't feel acutely previously.  - Urinalysis, urine culture - PSA

## 2016-12-02 NOTE — Progress Notes (Signed)
Anthony [name] - 51 y.o. male MRN [id number]  Date of birth: Sep 15, 1965  SUBJECTIVE:  Including CC & ROS.  Chief Complaint  Patient presents with  . Groin Pain    started yesterday Has to use bathroom more frequently, describes it as a uncomfortable feeling.  . Oral Pain    canker sore on right side of mouth that showed up yesterday    Anthony [name] is a 51 year old male that is presenting with an abnormal groin sensation and a sore on his tongue. He reports the started yesterday. He denies any specific complaint and his groin. He says it feels unusual. He has a history of a urinary tract infection and herpes simplex type II infection. He hasn't taken Valtrex recently. He denies fevers or chills. He denies any trauma to his groin. He denies any history hernia. He is monogamous with one partner. He denies any penile discharge. He denies any lymphadenopathy.     Review of Systems  Constitutional: Negative for fever.  Gastrointestinal: Negative for constipation and diarrhea.  Genitourinary: Positive for frequency and urgency. Negative for discharge, scrotal swelling and testicular pain.  Hematological: Negative for adenopathy.   otherwise negative  HISTORY: Past Medical, Surgical, Social, and Family History Reviewed & Updated per EMR.   Pertinent Historical Findings include:  Past Medical History:  Diagnosis Date  . Anemia   . Anxiety   . Asthma   . Cataract   . Depression   . DVT (deep venous thrombosis) ([place])   . Fatty liver   . H/O renal calculi 2006  . Hyperlipidemia   . Internal hemorrhoid   . Kidney stones   . PE (pulmonary embolism)   . Peptic ulcer disease   . Pneumonia   . Spondylosis   . Tubular adenoma of colon 2011   Dr Olevia Perches    Past Surgical History:  Procedure Laterality Date  . CATARACT EXTRACTION Left 1996   Dr Elonda Husky; [facility]  . colonoscopy with polypectomy  2011   Dr Olevia Perches, hemorrhoids  . CYSTOSCOPY W/ STONE MANIPULATION  2006  . INGUINAL HERNIA REPAIR Right  1990  . SHOULDER SURGERY Right 12/17/2010   Dr French Ana ; shoulder impingement & torn tendon    Allergies  Allergen Reactions  . Aleve [Naproxen] Rash    Patient stated it was years ago    Family History  Problem Relation Age of Onset  . Heart attack Mother 28  . Stroke Father 3  . Diabetes Father        PVD  . Glaucoma Father        blindness  . Cancer Paternal Grandmother        ? primary  . Heart attack Maternal Uncle         X2,pre 62  . Heart attack Maternal Grandmother 73  . Asthma Neg Hx   . COPD Neg Hx      Social History   Social History  . Marital status: Divorced    Spouse name: N/A  . Number of children: 4  . Years of education: 12   Occupational History  . Not on file.   Social History Main Topics  . Smoking status: Never Smoker  . Smokeless tobacco: Never Used  . Alcohol use 0.0 oz/week     Comment: Socially , < 2X/ month  . Drug use: No  . Sexual activity: Not on file     Comment: 2 boys and 2 girls, work at the [employer]  Other Topics Concern  . Not on file   Social History Narrative   Fun: [employer]     PHYSICAL EXAM:  VS: BP 140/80 (BP Location: Left Arm, Patient Position: Sitting, Cuff Size: Normal)   Pulse 88   Temp 97.8 F (36.6 C) (Oral)   Ht 5\' 7"  (1.702 m)   Wt 212 lb (96.2 kg)   SpO2 98%   BMI 33.20 kg/m  Physical Exam  Constitutional: He is oriented to person, place, and time. He appears well-developed and well-nourished.  HENT:  Head: Normocephalic and atraumatic.  Eyes: Conjunctivae and EOM are normal.  Neck: Normal range of motion. Neck supple.  Cardiovascular: Normal rate and regular rhythm.   Pulmonary/Chest: Effort normal and breath sounds normal.  Abdominal: Soft. Bowel sounds are normal. He exhibits no distension. There is no tenderness.  Genitourinary: No penile tenderness.  Genitourinary Comments: Circular lesion on the glans penis No penile discharge. No hernia present. No inguinal  lymphadenopathy. No pain with palpation of the scrotum.  Musculoskeletal: Normal range of motion. He exhibits no edema.  Neurological: He is alert and oriented to person, place, and time.  Skin: Skin is warm. No rash noted.  Psychiatric: He has a normal mood and affect. His behavior is normal.     ASSESSMENT & PLAN:   Dysuria He has a history of hematuria and urinary tract infection that grew greater 100,000 enterococcus. He has a history of elevated PSA and has followed up with urologist the reports having was normal. This doesn't feel acutely previously.  - Urinalysis, urine culture - PSA  Herpes simplex type II infection Has a history of a culture showing type II herpes. Possible he is having a flare. Does not have several lesions but one lesion on his glans penis. - Initiate Valtrex for recurrent symptoms. - HIV, RPR, GC, chlamydia

## 2016-12-03 ENCOUNTER — Telehealth: Payer: Self-pay | Admitting: Family

## 2016-12-03 LAB — RPR

## 2016-12-03 LAB — URINE CULTURE: Organism ID, Bacteria: NO GROWTH

## 2016-12-03 LAB — GC/CHLAMYDIA PROBE AMP
CT Probe RNA: NOT DETECTED
GC Probe RNA: NOT DETECTED

## 2016-12-03 NOTE — Telephone Encounter (Signed)
Patient has called back.  Gave patient MD response on current labs that he has received.

## 2017-01-01 DIAGNOSIS — M545 Low back pain: Secondary | ICD-10-CM | POA: Diagnosis not present

## 2017-01-08 DIAGNOSIS — M6281 Muscle weakness (generalized): Secondary | ICD-10-CM | POA: Diagnosis not present

## 2017-01-08 DIAGNOSIS — M545 Low back pain: Secondary | ICD-10-CM | POA: Diagnosis not present

## 2017-01-18 ENCOUNTER — Encounter: Payer: Self-pay | Admitting: Family

## 2017-01-21 ENCOUNTER — Other Ambulatory Visit: Payer: Self-pay | Admitting: Family

## 2017-01-26 ENCOUNTER — Encounter: Payer: Self-pay | Admitting: Family

## 2017-02-02 ENCOUNTER — Telehealth: Payer: Self-pay | Admitting: Internal Medicine

## 2017-02-02 NOTE — Telephone Encounter (Signed)
CY/KW please advise if pt can be worked in to a [place]- pt does not wish to wait until next available in January to see CY.  Thanks!

## 2017-02-03 ENCOUNTER — Encounter: Payer: Self-pay | Admitting: Family

## 2017-02-03 NOTE — Telephone Encounter (Signed)
OK to use RN slot, or he can see someone else

## 2017-02-03 NOTE — Telephone Encounter (Signed)
LMTCB- needs OV with CDY in RN slot

## 2017-02-04 NOTE — Telephone Encounter (Signed)
appt has been scheduled for the pt to be seen by CY on 10/10 at 11:45.  I have called and lmom to make the pt aware. Nothing further is needed.

## 2017-02-04 NOTE — Telephone Encounter (Signed)
lmtcb x2 for pt. 

## 2017-02-04 NOTE — Telephone Encounter (Signed)
Patient returned call and would like to use RNA slot for 02/10/2017 at 11:45 am with Dr. Annamaria Boots.  I tried to schedule this but do not have access to overide.  Please call him back to verify appt could be made.  CB is [phone].  May leave message confirming if no answer.

## 2017-02-09 ENCOUNTER — Encounter: Payer: Self-pay | Admitting: Family

## 2017-02-10 ENCOUNTER — Encounter: Payer: Self-pay | Admitting: Internal Medicine

## 2017-02-10 ENCOUNTER — Ambulatory Visit (INDEPENDENT_AMBULATORY_CARE_PROVIDER_SITE_OTHER): Payer: Federal, State, Local not specified - PPO | Admitting: Internal Medicine

## 2017-02-10 DIAGNOSIS — J4530 Mild persistent asthma, uncomplicated: Secondary | ICD-10-CM

## 2017-02-10 DIAGNOSIS — K269 Duodenal ulcer, unspecified as acute or chronic, without hemorrhage or perforation: Secondary | ICD-10-CM | POA: Diagnosis not present

## 2017-02-10 NOTE — Progress Notes (Signed)
HPI male never smoker followed for allergic rhinitis, asthma, complicated by history PE/DVT 2016, peptic ulcer, depression (VAH), glaucoma Allergy Profile 09/25/2011-total IgE 159.6 with multiple specific elevations especially for grass pollens, molds, tree pollens and ragweed. 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 He was diagnosed with asthma and bronchopneumonia at that time. He says he had no asthma before that pneumonia and he denies family history of asthma. He requests a letter supporting his argument that asthma developed as a consequence of pneumonia which occurred while he was in [employer] service.  ---------------------------------------------------------------------------------------  10/31/2015-51 year old male never smoker followed for allergic rhinitis, asthma, complicated by history PE/DVT 2016, peptic ulcer, depression (VAH), glaucoma FOLLOW FOR: patient wants to review labs done at Dr. Purcell Nails office, wants to have Dr. Annamaria Boots follow up on his blood clots. Pt said that allergies are fine, he is worried about blood clots right now. Has PE, last CT Angio done 05/02/15. Hypercoagulable panel showed elevated protein C, decreased protein S. His DVT had developed after a very long car ride. He started Eliquis in January and has been off it for 2 weeks, representing 6 months of therapy. No recurrence of DVT/PE identified. He continues aspirin 81 mg with no bleeding and has a hematology appointment soon for coagulopathy evaluation.  02/10/17- 51 year old male never smoker followed for allergic rhinitis, asthma, complicated by history PE/DVT 2016, peptic ulcer, depression (VAH), glaucoma, OSA (VA) Asthma; Pt  notes waking up gasping for air and Short winded. Had sleep study through [place] and be set up with CPAP soon. Pt notes wheezing and SOB with sneezing lately as well. Denies any cough or congestion. Describes waking with a choking sensation on a few occasions. Never aware of reflux/heartburn and doesn't notice sustained wheezing after these waking episodes to suggest asthma. CXR 11/13/16-  Normal exam  ROS-see HPI + = positive Constitutional:   No-   weight loss, night sweats, fevers, chills, fatigue, lassitude. HEENT:   No-  headaches, difficulty swallowing, tooth/dental problems, sore throat,    no-sneezing, no-itching, ear ache, no- nasal congestion, post nasal drip,  CV:  No--chest pain, No- orthopnea, PND, swelling in lower extremities, anasarca, dizziness, palpitations Resp: +shortness of breath with exertion or at rest.              No-  productive cough,  No non-productive cough,  No- coughing up of blood.                change in color of mucus.  No- wheezing.   Skin:  GI:  No-   heartburn, indigestion, abdominal pain, nausea, vomiting, GU:, MS:  No-   joint pain or swelling.   Neuro-     nothing unusual Psych:  No- change in mood or affect. No depression or anxiety.  No memory loss.  OBJ- Physical Exam General- Alert, Oriented, Affect-appropriate, Distress- none acute Skin- + mild hyperkeratosis on anterior chest Lymphadenopathy- none Head- atraumatic            Eyes- Gross vision intact, PERRLA, conjunctivae and secretions clear            Ears- Hearing, canals-normal            Nose-no-sniffing, no-Septal dev, polyps, erosion, perforation  Throat- Mallampati III , mucosa clear , drainage- none, tonsils- atrophic Neck- flexible , trachea midline, no stridor , thyroid nl, carotid no bruit Chest - symmetrical excursion , unlabored           Heart/CV- RRR , no murmur , no gallop  , no rub, nl s1 s2                           - JVD- none , edema- none, stasis changes- none,  varices- none           Lung- clear to P&A, wheeze-none, cough- none , dullness-none, rub- none           Chest wall-  Abd-  Br/ Gen/ Rectal- Not done, not indicated Extrem- cyanosis- none, clubbing, none, atrophy- none, strength- nl, negative Homan's Neuro- grossly intact to observation

## 2017-02-10 NOTE — Patient Instructions (Signed)
I suggested possibility that reflux from your stomach might occasionally cause choking or strangling at night.     For this- try putting a brick under each of the head legs of your bed, to raise the head about 2 inches. That helps keep stomach juice from refluxing up.   Sample Breo 100      Inhale 1 puff, once daily then rinse mouth     Try using this every day to see if it reduces wheezing, and maybe also the night time breathing problems.  Go ahead with the CPAP as planned through the [place].  Please call as needed

## 2017-02-11 ENCOUNTER — Encounter: Payer: Self-pay | Admitting: Family

## 2017-02-11 NOTE — Assessment & Plan Note (Signed)
Control has been good. He will work with [place] for possible. He has been diagnosed with sleep apnea but his description of nocturnal events is more suggestive of reflux, or possibly nocturnal asthma. Sleep apnea patients don't usually described "choking". Plan-I suggested trial of a maintenance inhaler and elevation of head of bed.

## 2017-02-11 NOTE — Assessment & Plan Note (Signed)
He describes nocturnal choking events which wake him. He doesn't recognize heartburn or reflux, but it could possibly be happening at night to cause "choking". Plan-he will go ahead with sleep apnea management through the [place]. I suggested elevation of head of bed for trial.

## 2017-02-12 ENCOUNTER — Encounter: Payer: Self-pay | Admitting: Family Medicine

## 2017-02-12 ENCOUNTER — Ambulatory Visit (INDEPENDENT_AMBULATORY_CARE_PROVIDER_SITE_OTHER): Payer: Federal, State, Local not specified - PPO | Admitting: Family Medicine

## 2017-02-12 VITALS — BP 136/84 | HR 78 | Temp 97.8°F | Ht 67.0 in | Wt 215.0 lb

## 2017-02-12 DIAGNOSIS — G44229 Chronic tension-type headache, not intractable: Secondary | ICD-10-CM

## 2017-02-12 NOTE — Patient Instructions (Signed)
Thank you for coming in,   I have made a referral to physical therapy.   Please give [place] a call back with the new medication that you were started on. I cannot start a different medication until I know what this one is.    Please feel free to call with any questions or concerns at any time, at [phone]. --Dr. Raeford Razor

## 2017-02-12 NOTE — Assessment & Plan Note (Addendum)
His headache appears to be multifactorial in nature. He has a history of PTSD, insomnia, and head trauma several years ago. - Counseled that this will not be resolved with one specific treatment. This will be a conglomeration of different strategies to overcome his pain.  - Counseled on sleep hygiene and relaxation techniques. - Referral to physical therapy for tension-type headache treatment. - Reports intolerance to trazodone. Can consider nortriptyline. But he will call in first to inform us of his most recent medication that was started before initiate the medicine. - Encouraged to continue his counseling and referral to neurology if not early place.

## 2017-02-12 NOTE — Progress Notes (Signed)
Anthony [name] - 51 y.o. male MRN [id number]  Date of birth: June 05, 1965  SUBJECTIVE:  Including CC & ROS.  Chief Complaint  Patient presents with  . Headache    present for 2 months or more-patient states he went to [place] on 02/08/17 which they gave him a shot-unware of what type of medicaiton-he states it helped some    Anthony [name] is a 51 year old male that is presenting with acute on chronic headache. Reports that he was involved in an accident while in the [employer]. He was obtained that ruled out a cliff. He is unclear as to when his headaches initially started but has been ongoing for several years. He has taken no specific medications for his headache. Has not seen the neurologist previously. His headache is frontal in nature and radiates to the occipital region. He can also initiating the occipital region and radiated to the front. He endorses photophobia and phonophobia. His headaches are daily and intermittent in intensity. Reports he only gets 3-4 hours of sleep per night. Has been prescribed trazodone previously with an intolerance to that. He also suffers from PTSD and has been recently been granted disability due to this diagnosis. 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 He denies any nausea or vomiting. Denies any numbness or tingling. Denies any fevers or recent illnesses. Has not traveled anywhere recently. Reports a history of DVT and only takes aspirin.   Review of his CT head from 2006 shows normal exam.  Review of Systems  Constitutional: Negative for fever.  HENT: Negative for sinus pressure.   Eyes: Positive for photophobia.  Respiratory: Negative for shortness of breath.   Cardiovascular: Negative for chest pain.  Gastrointestinal: Negative for abdominal pain.  Musculoskeletal: Negative for gait problem.  Skin: Negative for color change.  Neurological: Negative for weakness and numbness.    HISTORY: Past Medical, Surgical,  Social, and Family History Reviewed & Updated per EMR.   Pertinent Historical Findings include:  Past Medical History:  Diagnosis Date  . Anemia   . Anxiety   . Asthma   . Cataract   . Depression   . DVT (deep venous thrombosis) ([place])   . Fatty liver   . H/O renal calculi 2006  . Hyperlipidemia   . Internal hemorrhoid   . Kidney stones   . PE (pulmonary embolism)   . Peptic ulcer disease   . Pneumonia   . Spondylosis   . Tubular adenoma of colon 2011   Anthony [name]    Past Surgical History:  Procedure Laterality Date  . CATARACT EXTRACTION Left 1996   Anthony [name]; [facility]  . colonoscopy with polypectomy  2011   Anthony [name], hemorrhoids  . CYSTOSCOPY W/ STONE MANIPULATION  2006  . INGUINAL HERNIA REPAIR Right 1990  . SHOULDER SURGERY Right 12/17/2010   Anthony [name] ; shoulder impingement & torn tendon    Allergies  Allergen Reactions  . Aleve [Naproxen] Rash    Patient stated it was years ago    Family History  Problem Relation Age of Onset  . Heart attack Mother 37  . Stroke Father 19  . Diabetes Father        PVD  . Glaucoma Father        blindness  . Cancer Paternal Grandmother        ? primary  . Heart attack Maternal Uncle         X2,pre 65  .  Heart attack Maternal Grandmother 73  . Asthma Neg Hx   . COPD Neg Hx      Social History   Social History  . Marital status: Divorced    Spouse name: N/A  . Number of children: 4  . Years of education: 12   Occupational History  . Not on file.   Social History Main Topics  . Smoking status: Never Smoker  . Smokeless tobacco: Never Used  . Alcohol use 0.0 oz/week     Comment: Socially , < 2X/ month  . Drug use: No  . Sexual activity: Not on file     Comment: 2 boys and 2 girls, work at the post office   Other Topics Concern  . Not on file   Social History Narrative   Fun: [employer]     PHYSICAL EXAM:  VS: BP 136/84 (BP Location: Left Arm, Patient Position: Sitting, Cuff Size: Normal)   Pulse 78   Temp  97.8 F (36.6 C) (Oral)   Ht 5\' 7"  (1.702 m)   Wt 215 lb (97.5 kg)   SpO2 98%   BMI 33.67 kg/m  Physical Exam Gen: NAD, alert, cooperative with exam, well-appearing ENT: normal lips, normal nasal mucosa, pupils equal and reactive Eye: normal EOM, normal conjunctiva and lids CV:  no edema, +2 pedal pulses   Resp: no accessory muscle use, non-labored,  GI: no masses or tenderness, no hernia  Skin: no rashes, no areas of induration  Neuro: Cranial nerves II through XII intact, normal neck range of motion, normal shrug strength resistance, normal reflexes, normal sensation to touch Psych:  normal insight, alert and oriented MSK: Normal gait, normal strength, normal tone      ASSESSMENT & PLAN:   I spent 25 minutes with this patient, greater than 50% was face-to-face time counseling regarding the below diagnosis.   Chronic tension-type headache, not intractable His headache appears to be multifactorial in nature. He has a history of PTSD, insomnia, and head trauma several years ago. - Counseled that this will not be resolved with one specific treatment. This will be a conglomeration of different strategies to overcome his pain.  - Counseled on sleep hygiene and relaxation techniques. - Referral to physical therapy for tension-type headache treatment. - Reports intolerance to trazodone. Can consider nortriptyline. But he will call in first to inform us of his most recent medication that was started before initiate the medicine. - Encouraged to continue his counseling and referral to neurology if not early place.

## 2017-02-22 DIAGNOSIS — M791 Myalgia, unspecified site: Secondary | ICD-10-CM | POA: Diagnosis not present

## 2017-02-22 DIAGNOSIS — G894 Chronic pain syndrome: Secondary | ICD-10-CM | POA: Diagnosis not present

## 2017-02-22 DIAGNOSIS — M545 Low back pain: Secondary | ICD-10-CM | POA: Diagnosis not present

## 2017-02-25 DIAGNOSIS — G4733 Obstructive sleep apnea (adult) (pediatric): Secondary | ICD-10-CM | POA: Diagnosis not present

## 2017-03-04 DIAGNOSIS — H40023 Open angle with borderline findings, high risk, bilateral: Secondary | ICD-10-CM | POA: Diagnosis not present

## 2017-03-04 DIAGNOSIS — H02402 Unspecified ptosis of left eyelid: Secondary | ICD-10-CM | POA: Diagnosis not present

## 2017-03-11 ENCOUNTER — Encounter: Payer: Self-pay | Admitting: Family

## 2017-03-16 ENCOUNTER — Ambulatory Visit: Payer: Federal, State, Local not specified - PPO | Admitting: Internal Medicine

## 2017-03-16 ENCOUNTER — Encounter: Payer: Self-pay | Admitting: Internal Medicine

## 2017-03-16 VITALS — BP 132/88 | HR 94 | Temp 97.7°F | Ht 67.0 in | Wt 221.0 lb

## 2017-03-16 DIAGNOSIS — Z0000 Encounter for general adult medical examination without abnormal findings: Secondary | ICD-10-CM

## 2017-03-16 DIAGNOSIS — F419 Anxiety disorder, unspecified: Secondary | ICD-10-CM

## 2017-03-16 DIAGNOSIS — M65331 Trigger finger, right middle finger: Secondary | ICD-10-CM

## 2017-03-16 DIAGNOSIS — Z86718 Personal history of other venous thrombosis and embolism: Secondary | ICD-10-CM

## 2017-03-16 NOTE — Progress Notes (Signed)
Subjective:    Patient ID: Anthony [name], male    DOB: 1966/01/30, 51 y.o.   MRN: [id number]  HPI  Here to establish with new PCP, pt very concerned about upcoming travel to [place] with 5 hr flight, given his hx of LLE DVT.  Has had one episode DVT to LE after long drive several yrs ago, tx with anticoagulant x 6 mo and then changed to asa 81 qd.  No recurrence. Pt denies chest pain, increased sob or doe, wheezing, orthopnea, PND, increased LE swelling, palpitations, dizziness or syncope.  Pt denies new neurological symptoms such as new headache, or facial or extremity weakness or numbness   Pt denies polydipsia, polyuria, Also c/o mild right middle finger trigger like abnormal, but no pain and still useful  Declines Flu shot. Past Medical History:  Diagnosis Date  . Anemia   . Anxiety   . Asthma   . Cataract   . Depression   . DVT (deep venous thrombosis) ([place])   . Fatty liver   . H/O renal calculi 2006  . Hyperlipidemia   . Internal hemorrhoid   . Kidney stones   . PE (pulmonary embolism)   . Peptic ulcer disease   . Pneumonia   . Spondylosis   . Tubular adenoma of colon 2011   Dr Olevia Perches   Past Surgical History:  Procedure Laterality Date  . CATARACT EXTRACTION Left 1996   Dr Elonda Husky; [facility]  . colonoscopy with polypectomy  2011   Dr Olevia Perches, hemorrhoids  . CYSTOSCOPY W/ STONE MANIPULATION  2006  . INGUINAL HERNIA REPAIR Right 1990  . SHOULDER SURGERY Right 12/17/2010   Dr French Ana ; shoulder impingement & torn tendon    reports that  has never smoked. he has never used smokeless tobacco. He reports that he drinks alcohol. He reports that he does not use drugs. family history includes Cancer in his paternal grandmother; Diabetes in his father; Glaucoma in his father; Heart attack in his maternal uncle; Heart attack (age of onset: 40) in his mother; Heart attack (age of onset: 97) in his maternal grandmother; Stroke (age of onset: 57) in his father. Allergies  Allergen  Reactions  . Aleve [Naproxen] Rash    Patient stated it was years ago   Current Outpatient Medications on File Prior to Visit  Medication Sig Dispense Refill  . albuterol (PROVENTIL HFA;VENTOLIN HFA) 108 (90 Base) MCG/ACT inhaler Inhale 2 puffs into the lungs every 6 (six) hours as needed. For shortness of breath 1 Inhaler 1  . ARIPiprazole (ABILIFY) 10 MG tablet Take 10 mg by mouth daily as needed (anxiety).     . Brinzolamide-Brimonidine 1-0.2 % SUSP Place 1 drop into both eyes 2 (two) times daily.    . Bromfenac Sodium (PROLENSA) 0.07 % SOLN as directed. Reported on 09/07/2015    . chlorhexidine (PERIDEX) 0.12 % solution Use as directed 15 mLs in the mouth or throat 2 (two) times daily. After the symbicort inhaler - swish and spit 120 mL 0  . diclofenac sodium (VOLTAREN) 1 % GEL Apply 4 g topically 4 (four) times daily as needed. 400 g 2  . fluticasone (FLONASE) 50 MCG/ACT nasal spray Place 2 sprays into both nostrils daily as needed for allergies.    Marland Kitchen lidocaine (XYLOCAINE) 2 % solution Use 5 ml po as directed every 2 hours as needed for pain 200 mL 0  . mupirocin ointment (BACTROBAN) 2 % App to skin irritation tid prn 22 g 5  .  PARoxetine (PAXIL) 10 MG tablet Take 10 mg by mouth daily.    . SYMBICORT 160-4.5 MCG/ACT inhaler Inhale 2 puffs into the lungs daily as needed (shortness of breath).      No current facility-administered medications on file prior to visit.    Review of Systems  Constitutional: Negative for other unusual diaphoresis or sweats HENT: Negative for ear discharge or swelling Eyes: Negative for other worsening visual disturbances Respiratory: Negative for stridor or other swelling  Gastrointestinal: Negative for worsening distension or other blood Genitourinary: Negative for retention or other urinary change Musculoskeletal: Negative for other MSK pain or swelling Skin: Negative for color change or other new lesions Neurological: Negative for worsening tremors and  other numbness  Psychiatric/Behavioral: Negative for worsening agitation or other fatigue All other system neg per pt    Objective:   Physical Exam BP 132/88   Pulse 94   Temp 97.7 F (36.5 C) (Oral)   Ht 5\' 7"  (1.702 m)   Wt 221 lb (100.2 kg)   SpO2 99%   BMI 34.61 kg/m  VS noted,  Constitutional: Pt appears in NAD HENT: Head: NCAT.  Right Ear: External ear normal.  Left Ear: External ear normal.  Eyes: . Pupils are equal, round, and reactive to light. Conjunctivae and EOM are normal Nose: without d/c or deformity Neck: Neck supple. Gross normal ROM Cardiovascular: Normal rate and regular rhythm.   Pulmonary/Chest: Effort normal and breath sounds without rales or wheezing.  + mild right middle finger trigger like DIP inhibiting ROM but o/w NVI.   Neurological: Pt is alert. At baseline orientation, motor grossly intact Skin: Skin is warm. No rashes, other new lesions, no LE edema Psychiatric: Pt behavior is normal without agitation  No other exam findings     Assessment & Plan:

## 2017-03-16 NOTE — Patient Instructions (Addendum)
Ok to increase the Aspirin to 325 mg for 1 wk around your trip.  OK to wear Pressure Stockings during the flights  Please continue all other medications as before, and refills have been done if requested.  Please have the pharmacy call with any other refills you may need.  Please continue your efforts at being more active, low cholesterol diet, and weight control.  Please keep your appointments with your specialists as you may have planned  Please call if you need the referral to Hand Surgury  Please return in 6 months, or sooner if needed, with Lab testing done 3-5 days before

## 2017-03-18 ENCOUNTER — Ambulatory Visit: Payer: Federal, State, Local not specified - PPO | Admitting: Physician Assistant

## 2017-03-18 ENCOUNTER — Other Ambulatory Visit: Payer: Self-pay

## 2017-03-18 ENCOUNTER — Encounter: Payer: Self-pay | Admitting: Family Medicine

## 2017-03-18 VITALS — BP 126/78 | HR 90 | Temp 98.0°F | Resp 18 | Ht 68.43 in | Wt 218.6 lb

## 2017-03-18 DIAGNOSIS — F419 Anxiety disorder, unspecified: Secondary | ICD-10-CM | POA: Insufficient documentation

## 2017-03-18 DIAGNOSIS — B009 Herpesviral infection, unspecified: Secondary | ICD-10-CM | POA: Diagnosis not present

## 2017-03-18 DIAGNOSIS — Z711 Person with feared health complaint in whom no diagnosis is made: Secondary | ICD-10-CM

## 2017-03-18 DIAGNOSIS — M65331 Trigger finger, right middle finger: Secondary | ICD-10-CM | POA: Insufficient documentation

## 2017-03-18 MED ORDER — VALACYCLOVIR HCL 1 G PO TABS: ORAL_TABLET | ORAL | 0 refills | Status: DC

## 2017-03-18 NOTE — Progress Notes (Signed)
Anthony [name]  MRN: [id number] DOB: [birth year]  Subjective:  Anthony [name] is a 51 y.o. male seen in office today for a chief complaint of questionable left leg pain.  Patient states he does not really have leg pain but he is concerned about having leg pain on a trip tomorrow.  He had a DVT and small peripheral PE in 2016 after riding in a car for 17 hours from [place].  He is not traveled since this incident.  He denies leg swelling, leg warmth, shortness of breath, chest pain, and heart palpitations.  He actually saw his PCP about this 2 days ago.  They recommended he increase his aspirin dose from 81 mg to 325 mg, wear compression stockings on the flight, and get up every 1 hour and walk around while on the flight.  He denies recent hospitalizations or surgeries.  No personal history of cancer.  Denies smoking.  Patient would also like a refill for his valacyclovir for genital herpes.  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  He is only had one outbreak in the past.  Had positive HSV-2 culture in 2017.  He is not currently having any symptoms of outbreak.  Review of Systems  Per HPI  Patient Active Problem List   Diagnosis Date Noted  . Chronic tension-type headache, not intractable 02/12/2017  . Herpes simplex type II infection 12/02/2016  . Dysuria 12/02/2016  . Pain of right thumb 11/13/2016  . Other chest pain 07/27/2016  . Onychomycosis 06/12/2016  . Encounter for general adult medical examination with abnormal findings 06/12/2016  . Generalized headache 05/15/2016  . Lumbar paraspinal muscle spasm 05/15/2016  . Cervical paraspinal muscle spasm 05/15/2016  . History of DVT (deep vein thrombosis) 11/18/2015  . Elevated PSA 11/18/2015  . Acute pulmonary embolism ([place]) 2016 05/09/2015  . Peptic ulcer 05/09/2015  . Dyspnea 05/02/2015  . DVT (deep venous thrombosis) ([place]) 04/26/2015  . Duodenal ulcer 04/25/2015  . Positive D dimer 04/23/2015  .  MRSA infection 09/14/2013  . Spondylosis of lumbar joint 09/28/2012  . Fatty liver disease, nonalcoholic 65/99/3570  . Seasonal and perennial allergic rhinitis 10/01/2011  . Family history of ischemic heart disease 04/02/2011  . 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 COLONIC POLYPS, HX OF 06/21/2009  . HYPERLIPIDEMIA 02/16/2008  . Asthma, mild persistent 02/16/2008  . NONSPECIFIC ABNORMAL ELECTROCARDIOGRAM 02/16/2008  . NEPHROLITHIASIS, HX OF 02/16/2008    Current Outpatient Medications on File Prior to Visit  Medication Sig Dispense Refill  . albuterol (PROVENTIL HFA;VENTOLIN HFA) 108 (90 Base) MCG/ACT inhaler Inhale 2 puffs into the lungs every 6 (six) hours as needed. For shortness of breath 1 Inhaler 1  . ARIPiprazole (ABILIFY) 10 MG tablet Take 10 mg by mouth daily as needed (anxiety).     . Brinzolamide-Brimonidine 1-0.2 % SUSP Place 1 drop into both eyes 2 (two) times daily.    . Bromfenac Sodium (PROLENSA) 0.07 % SOLN as directed. Reported on 09/07/2015    . chlorhexidine (PERIDEX) 0.12 % solution Use as directed 15 mLs in the mouth or throat 2 (two) times daily. After the symbicort inhaler - swish and spit 120 mL 0  . diclofenac sodium (VOLTAREN) 1 % GEL Apply 4 g topically 4 (four) times daily as needed. 400 g 2  . fluticasone (FLONASE) 50 MCG/ACT nasal spray Place 2 sprays into both nostrils daily as needed for allergies.    Marland Kitchen lidocaine (XYLOCAINE) 2 %  solution Use 5 ml po as directed every 2 hours as needed for pain 200 mL 0  . mupirocin ointment (BACTROBAN) 2 % App to skin irritation tid prn 22 g 5  . PARoxetine (PAXIL) 10 MG tablet Take 10 mg by mouth daily.    . SYMBICORT 160-4.5 MCG/ACT inhaler Inhale 2 puffs into the lungs daily as needed (shortness of breath).      No current facility-administered medications on file prior to visit.     Allergies  Allergen Reactions  . Aleve [Naproxen] Rash    Patient stated it was years ago     Objective:  BP 126/78 (BP  Location: Left Arm, Patient Position: Sitting, Cuff Size: Large)   Pulse 90   Temp 98 F (36.7 C) (Oral)   Resp 18   Ht 5' 8.43" (1.738 m)   Wt 218 lb 9.6 oz (99.2 kg)   SpO2 99%   BMI 32.83 kg/m   Physical Exam  Constitutional: He is oriented to person, place, and time and well-developed, well-nourished, and in no distress.  HENT:  Head: Normocephalic and atraumatic.  Eyes: Conjunctivae are normal.  Neck: Normal range of motion.  Cardiovascular: Normal rate, regular rhythm and normal heart sounds.  Pulmonary/Chest: Effort normal and breath sounds normal. He has no wheezes. He has no rales.  Musculoskeletal:       Right lower leg: He exhibits no tenderness and no swelling.       Left lower leg: He exhibits no tenderness and no swelling.  Calf measures 40 cm bilaterally. No warmth of bilateral extremities palpated.  Negative Homan's sign on left lower extremity.   Neurological: He is alert and oriented to person, place, and time. Gait normal.  Skin: Skin is warm and dry.  Psychiatric: Affect normal.  Vitals reviewed.   Assessment and Plan :  1. Feared condition not demonstrated Patient reassured that he has no clinical signs or symptoms of a DVT.  His physical exam was completely normal.  I agree with his PCP that patient should get up and walk every hour while flying.  He should also wear compression stockings.  Given educational material on DVT.  Encouraged him that if he develops any signs of a DVT after flying to seek care immediately.   2. HSV-2 infection Patient educated that he should not be on daily suppressive therapy any longer since he has taken the medication daily for 2.5 years at this time, with only one documented outbreak.  I have given him a refill to use if he has an [place] and educated him on the dosing if the outbreak recurs.  - valACYclovir (VALTREX) 1000 MG tablet; Take 500 mg twice daily for 3 days or 1 g once daily for 5 days at onset of break out.   Dispense: 30 tablet; Refill: 0  Tenna Delaine PA-C  [facility] 03/18/2017 2:52 PM

## 2017-03-18 NOTE — Assessment & Plan Note (Signed)
Mild, declines referral to hand surgury

## 2017-03-18 NOTE — Assessment & Plan Note (Signed)
D/w pt, ok for increase asa to 325 for 1 wk, then 81 daily after [place] from [place], for support stocking and walks about hourly, leg elevation if possible,  to f/u any worsening symptoms or concerns

## 2017-03-18 NOTE — Assessment & Plan Note (Signed)
Situational, pt reassured

## 2017-03-18 NOTE — Patient Instructions (Signed)
Your physical exam findings are reassuring today.  I believe that you can go on your trip without any concerns.  I do recommend getting up and walking every hour while on the flight.  I also recommend wearing compression stockings.  Below some more information to read about deep vein thrombosis.  If you develop any sudden calf pain, swelling, or warmth after her flight please seek care immediately.  In terms of the Valtrex, it is recommended that you take a break from taking it daily.  I have given you a refill to use as needed for outbreaks.  You will notice that you are starting to have an outbreak when you start to feel a tingling sensation in the area.  Deep Vein Thrombosis A deep vein thrombosis (DVT) is a blood clot (thrombus) that usually occurs in a deep, larger vein of the lower leg or the pelvis, or in an upper extremity such as the arm. These are dangerous and can lead to serious and even life-threatening complications if the clot travels to the lungs. A DVT can damage the valves in your leg veins so that instead of flowing upward, the blood pools in the lower leg. This is called post-thrombotic syndrome, and it can result in pain, swelling, discoloration, and sores on the leg. What are the causes? A DVT is caused by the formation of a blood clot in your leg, pelvis, or arm. Usually, several things contribute to the formation of blood clots. A clot may develop when:  Your blood flow slows down.  Your vein becomes damaged in some way.  You have a condition that makes your blood clot more easily.  What increases the risk? A DVT is more likely to develop in:  People who are older, especially over 27 years of age.  People who are overweight (obese).  People who sit or lie still for a long time, such as during long-distance travel (over 4 hours), bed rest, hospitalization, or during recovery from certain medical conditions like a stroke.  People who do not engage in much physical  activity (sedentary lifestyle).  People who have chronic breathing disorders.  People who have a personal or family history of blood clots or blood clotting disease.  People who have peripheral vascular disease (PVD), diabetes, or some types of cancer.  People who have heart disease, especially if the person had a recent heart attack or has congestive heart failure.  People who have neurological diseases that affect the legs (leg paresis).  People who have had a traumatic injury, such as breaking a hip or leg.  People who have recently had major or lengthy surgery, especially on the hip, knee, or abdomen.  People who have had a central line placed inside a large vein.  People who take medicines that contain the hormone estrogen. These include birth control pills and hormone replacement therapy.  Pregnancy or during childbirth or the postpartum period.  Long plane flights (over 8 hours).  What are the signs or symptoms?  Symptoms of a DVT can include:  Swelling of your leg or arm, especially if one side is much worse.  Warmth and redness of your leg or arm, especially if one side is much worse.  Pain in your arm or leg. If the clot is in your leg, symptoms may be more noticeable or worse when you stand or walk.  A feeling of pins and needles, if the clot is in the arm.  The symptoms of a DVT that has  traveled to the lungs (pulmonary embolism, PE) usually start suddenly and include:  Shortness of breath while active or at rest.  Coughing or coughing up blood or blood-tinged mucus.  Chest pain that is often worse with deep breaths.  Rapid or irregular heartbeat.  Feeling light-headed or dizzy.  Fainting.  Feeling anxious.  Sweating.  There may also be pain and swelling in a leg if that is where the blood clot started. These symptoms may represent a serious problem that is an emergency. Do not wait to see if the symptoms will go away. Get medical help right away.  Call your local emergency services (911 in the [place] Do not drive yourself to the hospital. How is this diagnosed? Your health care provider will take a medical history and perform a physical exam. You may also have other tests, including:  Blood tests to assess the clotting properties of your blood.  Imaging tests, such as CT, ultrasound, MRI, X-ray, and other tests to see if you have clots anywhere in your body.  How is this treated? After a DVT is identified, it can be treated. The type of treatment that you receive depends on many factors, such as the cause of your DVT, your risk for bleeding or developing more clots, and other medical conditions that you have. Sometimes, a combination of treatments is necessary. Treatment options may be combined and include:  Monitoring the blood clot with ultrasound.  Taking medicines by mouth, such as newer blood thinners (anticoagulants), thrombolytics, or warfarin.  Taking anticoagulant medicine by injection or through an IV tube.  Wearing compression stockings or using different types ofdevices.  Surgery (rare) to remove the blood clot or to place a filter in your abdomen to stop the blood clot from traveling to your lungs.  Treatments for a DVT are often divided into immediate treatment and long-term treatment (up to 3 months after DVT). You can work with your health care provider to choose the treatment program that is best for you. Follow these instructions at home: If you are taking a newer oral anticoagulant:  Take the medicine every single day at the same time each day.  Understand what foods and drugs interact with this medicine.  Understand that there are no regular blood tests required when using this medicine.  Understand the side effects of this medicine, including excessive bruising or bleeding. Ask your health care provider or pharmacist about other possible side effects. If you are taking warfarin:  Understand how to take  warfarin and know which foods can affect how warfarin works in Veterinary surgeon.  Understand that it is dangerous to take too much or too little warfarin. Too much warfarin increases the risk of bleeding. Too little warfarin continues to allow the risk for blood clots.  Follow your PT and INR blood testing schedule. The PT and INR results allow your health care provider to adjust your dose of warfarin. It is very important that you have your PT and INR tested as often as told by your health care provider.  Avoid major changes in your diet, or tell your health care provider before you change your diet. Arrange a visit with a registered dietitian to answer your questions. Many foods, especially foods that are high in vitamin K, can interfere with warfarin and affect the PT and INR results. Eat a consistent amount of foods that are high in vitamin K, such as: ? Spinach, kale, broccoli, cabbage, collard greens, turnip greens, Brussels sprouts, peas, cauliflower, seaweed,  and parsley. ? Beef liver and pork liver. ? Green tea. ? Soybean oil.  Tell your health care provider about any and all medicines, vitamins, and supplements that you take, including aspirin and other over-the-counter anti-inflammatory medicines. Be especially cautious with aspirin and anti-inflammatory medicines. Do not take those before you ask your health care provider if it is safe to do so. This is important because many medicines can interfere with warfarin and affect the PT and INR results.  Do not start or stop taking any over-the-counter or prescription medicine unless your health care provider or pharmacist tells you to do so. If you take warfarin, you will also need to do these things:  Hold pressure over cuts for longer than usual.  Tell your dentist and other health care providers that you are taking warfarin before you have any procedures in which bleeding may occur.  Avoid alcohol or drink very small amounts. Tell your health  care provider if you change your alcohol intake.  Do not use tobacco products, including cigarettes, chewing tobacco, and e-cigarettes. If you need help quitting, ask your health care provider.  Avoid contact sports.  General instructions  Take over-the-counter and prescription medicines only as told by your health care provider. Anticoagulant medicines can have side effects, including easy bruising and difficulty stopping bleeding. If you are prescribed an anticoagulant, you will also need to do these things: ? Hold pressure over cuts for longer than usual. ? Tell your dentist and other health care providers that you are taking anticoagulants before you have any procedures in which bleeding may occur. ? Avoid contact sports.  Wear a medical alert bracelet or carry a medical alert card that says you have had a PE.  Ask your health care provider how soon you can go back to your normal activities. Stay active to prevent new blood clots from forming.  Make sure to exercise while traveling or when you have been sitting or standing for a long period of time. It is very important to exercise. Exercise your legs by walking or by tightening and relaxing your leg muscles often. Take frequent walks.  Wear compression stockings as told by your health care provider to help prevent more blood clots from forming.  Do not use tobacco products, including cigarettes, chewing tobacco, and e-cigarettes. If you need help quitting, ask your health care provider.  Keep all follow-up appointments with your health care provider. This is important. How is this prevented? Take these actions to decrease your risk of developing another DVT:  Exercise regularly. For at least 30 minutes every day, engage in: ? Activity that involves moving your arms and legs. ? Activity that encourages good blood flow through your body by increasing your heart rate.  Exercise your arms and legs every hour during long-distance  travel (over 4 hours). Drink plenty of water and avoid drinking alcohol while traveling.  Avoid sitting or lying in bed for long periods of time without moving your legs.  Maintain a weight that is appropriate for your height. Ask your health care provider what weight is healthy for you.  If you are a woman who is over 27 years of age, avoid unnecessary use of medicines that contain estrogen. These include birth control pills.  Do not smoke, especially if you take estrogen medicines. If you need help quitting, ask your health care provider.  If you are hospitalized, prevention measures may include:  Early walking after surgery, as soon as your health care provider says  that it is safe.  Receiving anticoagulants to prevent blood clots.If you cannot take anticoagulants, other options may be available, such as wearing compression stockings or using different types of devices.  Get help right away if:  You have new or increased pain, swelling, or redness in an arm or leg.  You have numbness or tingling in an arm or leg.  You have shortness of breath while active or at rest.  You have chest pain.  You have a rapid or irregular heartbeat.  You feel light-headed or dizzy.  You cough up blood.  You notice blood in your vomit, bowel movement, or urine. These symptoms may represent a serious problem that is an emergency. Do not wait to see if the symptoms will go away. Get medical help right away. Call your local emergency services (911 in the [place] Do not drive yourself to the hospital. This information is not intended to replace advice given to you by your health care provider. Make sure you discuss any questions you have with your health care provider. Document Released: 04/20/2005 Document Revised: 09/26/2015 Document Reviewed: 08/15/2014 Elsevier Interactive Patient Education  2017 [employer].

## 2017-03-23 DIAGNOSIS — G4733 Obstructive sleep apnea (adult) (pediatric): Secondary | ICD-10-CM | POA: Diagnosis not present

## 2017-04-02 DIAGNOSIS — F419 Anxiety disorder, unspecified: Secondary | ICD-10-CM | POA: Diagnosis not present

## 2017-04-02 DIAGNOSIS — M65311 Trigger thumb, right thumb: Secondary | ICD-10-CM | POA: Diagnosis not present

## 2017-04-05 DIAGNOSIS — Z049 Encounter for examination and observation for unspecified reason: Secondary | ICD-10-CM | POA: Diagnosis not present

## 2017-04-05 DIAGNOSIS — G43719 Chronic migraine without aura, intractable, without status migrainosus: Secondary | ICD-10-CM | POA: Diagnosis not present

## 2017-04-07 ENCOUNTER — Encounter: Payer: Self-pay | Admitting: Internal Medicine

## 2017-04-08 MED ORDER — VALACYCLOVIR HCL 500 MG PO TABS: 500.0000 mg | ORAL_TABLET | Freq: Two times a day (BID) | ORAL | 3 refills | Status: DC

## 2017-04-13 DIAGNOSIS — M65311 Trigger thumb, right thumb: Secondary | ICD-10-CM | POA: Diagnosis not present

## 2017-05-03 DIAGNOSIS — F4310 Post-traumatic stress disorder, unspecified: Secondary | ICD-10-CM | POA: Diagnosis not present

## 2017-05-06 ENCOUNTER — Encounter: Payer: Self-pay | Admitting: Family Medicine

## 2017-05-07 DIAGNOSIS — G8929 Other chronic pain: Secondary | ICD-10-CM | POA: Diagnosis not present

## 2017-05-07 DIAGNOSIS — M545 Low back pain: Secondary | ICD-10-CM | POA: Diagnosis not present

## 2017-05-20 DIAGNOSIS — B351 Tinea unguium: Secondary | ICD-10-CM | POA: Diagnosis not present

## 2017-05-21 DIAGNOSIS — M545 Low back pain: Secondary | ICD-10-CM | POA: Diagnosis not present

## 2017-05-21 DIAGNOSIS — M65311 Trigger thumb, right thumb: Secondary | ICD-10-CM | POA: Insufficient documentation

## 2017-05-21 DIAGNOSIS — Z4789 Encounter for other orthopedic aftercare: Secondary | ICD-10-CM | POA: Insufficient documentation

## 2017-05-28 DIAGNOSIS — M545 Low back pain: Secondary | ICD-10-CM | POA: Diagnosis not present

## 2017-06-02 DIAGNOSIS — F4310 Post-traumatic stress disorder, unspecified: Secondary | ICD-10-CM | POA: Diagnosis not present

## 2017-06-15 ENCOUNTER — Encounter: Payer: Self-pay | Admitting: Internal Medicine

## 2017-06-16 ENCOUNTER — Encounter: Payer: Self-pay | Admitting: Internal Medicine

## 2017-06-16 DIAGNOSIS — F4310 Post-traumatic stress disorder, unspecified: Secondary | ICD-10-CM | POA: Diagnosis not present

## 2017-06-16 NOTE — Telephone Encounter (Signed)
Staff -   To assist pt with ROV in mar or april

## 2017-06-29 DIAGNOSIS — M545 Low back pain: Secondary | ICD-10-CM | POA: Diagnosis not present

## 2017-06-30 DIAGNOSIS — F4310 Post-traumatic stress disorder, unspecified: Secondary | ICD-10-CM | POA: Diagnosis not present

## 2017-07-06 DIAGNOSIS — Z113 Encounter for screening for infections with a predominantly sexual mode of transmission: Secondary | ICD-10-CM | POA: Diagnosis not present

## 2017-07-07 ENCOUNTER — Encounter: Payer: Self-pay | Admitting: Family Medicine

## 2017-07-07 ENCOUNTER — Ambulatory Visit: Payer: Federal, State, Local not specified - PPO | Admitting: Family Medicine

## 2017-07-07 ENCOUNTER — Other Ambulatory Visit (HOSPITAL_COMMUNITY)
Admission: RE | Admit: 2017-07-07 | Discharge: 2017-07-07 | Disposition: A | Discharge status: Home / Self Care | Payer: Federal, State, Local not specified - PPO | Source: Ambulatory Visit | Attending: Family Medicine | Admitting: Family Medicine

## 2017-07-07 VITALS — BP 118/70 | HR 93 | Ht 68.43 in | Wt 217.5 lb

## 2017-07-07 DIAGNOSIS — Z7721 Contact with and (suspected) exposure to potentially hazardous body fluids: Secondary | ICD-10-CM | POA: Insufficient documentation

## 2017-07-07 LAB — URINALYSIS, ROUTINE W REFLEX MICROSCOPIC
Bilirubin Urine: NEGATIVE
Ketones, ur: NEGATIVE
Leukocytes, UA: NEGATIVE
Nitrite: NEGATIVE
Specific Gravity, Urine: >=1.03 — AB (ref 1.000–1.030)
Total Protein, Urine: NEGATIVE
Urine Glucose: NEGATIVE
Urobilinogen, UA: 0.2 (ref 0.0–1.0)
pH: 5.5 (ref 5.0–8.0)

## 2017-07-07 NOTE — Patient Instructions (Addendum)
Body Fluid Exposure Information People may come into contact with blood and other body fluids under various circumstances. In some cases, body fluids may contain germs (bacteria or viruses) that cause infections. These germs can be spread when an infected person's body fluids come into contact with the mouth, nose, eyes, genitals, or broken skin of another person. Broken skin includes skin that has been opened by cuts, abrasions, dermatitis, or chapped skin. Exposure to infected body fluids is a common risk for health care workers and family members who care for sick people. Other common methods of exposure include injection drug use, sharing needles, and sexual activity. The risk of an infection spreading through body fluid exposure is small and depends on a variety of factors. These include the type of body fluid, the nature of the exposure, and the health status of the person who was the source of the body fluids. Your health care provider can help you assess the risk. Prevention is the first defense against body fluid exposure. What types of body fluids can spread infection? The following body fluids have the potential to spread infections:  Blood.  Semen.  Vaginal secretions.  Urine.  Feces.  Saliva.  Nasal or eye discharge.  Breast milk.  Amniotic fluid.  Fluids surrounding body organs.  What are some first-aid measures for body fluid exposure? The following steps should be taken as soon as possible after a person is exposed to body fluids: Intact skin  For contact with closed skin, wash the area with soap and water.  Broken skin  For contact with broken skin: ? Let the area bleed a little. ? Wash well with soap and water. If soap is not available, use just water or hand sanitizer. ? Place a bandage or clean towel on the wound and apply gentle pressure to stop the bleeding. Do not squeeze or rub the area.  Do not use harsh chemicals such as bleach or  iodine.  Eyes  Rinse the eyes with water or saline for 30 seconds or longer.  If the person is wearing contact lenses, leave the contact lenses in while rinsing the eyes. After the rinsing is complete, remove the contact lenses.  Mouth  Spit out the fluids. Rinse with water 4-5 times, spitting it out each time.  In addition, you should remove any clothing that comes into contact with body fluids. However, if you came into contact with the fluids as a result of sexual assault, seek medical care immediately. Do not shower, take a bath, or change clothes until police collect evidence of sexual assault from your body and your clothes. When should I seek help? After performing the proper first-aid steps:  You should call your health care provider or seek emergency care right away if blood or other body fluids made contact with areas of broken skin or openings such as the eyes, nose, or mouth.  If the exposure to body fluid happened in the workplace, you should report it to your work supervisor immediately. Many workplaces have procedures in place for exposure situations.  What will happen after I report the exposure? Your health care provider will ask you several questions. Information requested may include:  Your medical history, including vaccination records.  Date and time of the exposure.  Whether you saw body fluids during the exposure.  Type of body fluid you were exposed to.  Volume of body fluid you were exposed to.  How the exposure happened.  If any devices, such as needles, were  being used.  Which area of your body made contact with the body fluid.  Description of any injury to the skin or other area.  How long contact was made with the body fluid.  The health status of the person whose body fluid you were exposed to, if known.  Your health care provider will assess your risk for infection. Often, no treatment is necessary. However, in some cases:  Your health  care provider may recommend doing blood tests right away.  Follow-up blood tests may also be done at certain intervals during the upcoming weeks and months to check for any changes.  You may be offered treatment to prevent an infection from developing after exposure (post-exposure prophylaxis). This may include certain vaccinations or medicines and may be necessary when there is a risk of a serious infection, such as HIV (human immunodeficiency virus) or hepatitis B. Your health care provider will discuss appropriate treatment and vaccinations with you.  How can I prevent exposure and infection? To help prevent exposure to body fluids:  Wash and disinfect countertops and other surfaces regularly.  Wear appropriate protective gear such as gloves, gowns, masks, or eyewear when the possibility of exposure is present.  Wipe away spills of body fluid with disposable towels.  Properly dispose of blood products and other fluids. Use secured bags.  Properly dispose of needles and other instruments with sharp points or edges (sharps). Use closed, marked containers.  Avoid injection drug use.  Do not share needles.  Avoid recapping needles.  Use a condom during sexual intercourse.  Learn and follow any guidelines for preventing exposure (universal precautions) provided at your workplace.  To help reduce your chances of getting an infection:  Make sure your vaccinations are up-to-date, including vaccinations for tetanus and hepatitis.  Wash your hands frequently with soap and water. If water and soap are not available, use hand sanitizer.  Avoid having multiple sexual partners.  Keep all follow-up visits as told by your health care provider. This is important because you will need to be monitored after you are evaluated for exposure to body fluids.  To avoid spreading infection to others:  Do not have sexual relations until you know you are free of infection.  Do not donate blood,  plasma, breast milk, sperm, or other body fluids.  Do not share hygiene items such as toothbrushes, razors, or dental floss.  Keep open wounds covered.  Dispose of any items with blood on them (razors, tampons, bandages) by putting them in the trash.  Do not share drug supplies, such as needles, syringes, straws, or pipes, with others.  Follow all instructions from your health care provider for preventing the spread of infection.  This information is not intended to replace advice given to you by your health care provider. Make sure you discuss any questions you have with your health care provider. Document Released: 12/21/2012 Document Revised: 12/26/2015 Document Reviewed: 12/26/2015 Elsevier Interactive Patient Education  2017 [place] Sex Practicing safe sex means taking steps before and during sex to reduce your risk of:  Getting an STD (sexually transmitted disease).  Giving your partner an STD.  Unwanted pregnancy.  How can I practice safe sex?  To practice safe sex:  Limit your sexual partners to only one partner who is having sex with only you.  Avoid using alcohol and recreational drugs before having sex. These substances can affect your judgment.  Before having sex with a new partner: ? Talk to your partner  about past partners, past STDs, and drug use. ? You and your partner should be screened for STDs and discuss the results with each other.  Check your body regularly for sores, blisters, rashes, or unusual discharge. If you notice any of these problems, visit your health care provider.  If you have symptoms of an infection or you are being treated for an STD, avoid sexual contact.  While having sex, use a condom. Make sure to: ? Use a condom every time you have vaginal, oral, or anal sex. Both females and males should wear condoms during oral sex. ? Keep condoms in place from the beginning to the end of sexual activity. ? Use a latex condom, if  possible. Latex condoms offer the best protection. ? Use only water-based lubricants or oils to lubricate a condom. Using petroleum-based lubricants or oils will weaken the condom and increase the chance that it will break.  See your health care provider for regular screenings, exams, and tests for STDs.  Talk with your health care provider about the form of birth control (contraception) that is best for you.  Get vaccinated against hepatitis B and human papillomavirus (HPV).  If you are at risk of being infected with HIV (human immunodeficiency virus), talk with your health care provider about taking a prescription medicine to prevent HIV infection. You are considered at risk for HIV if: ? You are a man who has sex with other men. ? You are a heterosexual man or woman who is sexually active with more than one partner. ? You take drugs by injection. ? You are sexually active with a partner who has HIV.  This information is not intended to replace advice given to you by your health care provider. Make sure you discuss any questions you have with your health care provider. Document Released: 05/28/2004 Document Revised: 09/04/2015 Document Reviewed: 03/10/2015 Elsevier Interactive Patient Education  2018 [place] Sexually Transmitted Infections, Adult Sexually transmitted infections (STIs) are diseases that are passed (transmitted) from person to person through bodily fluids exchanged during sex or sexual contact. Bodily fluids include saliva, semen, blood, vaginal mucus, and urine. You may have an increased risk for developing an STI if you have unprotected oral, vaginal, or anal sex. Some common STIs include:  Herpes.  Hepatitis B.  Chlamydia.  Gonorrhea.  Syphilis.  HPV (human papillomavirus).  HIV (humanimmunodeficiency virus), the virus that can cause AIDS (acquired immunodeficiency virus).  How can I protect myself from sexually transmitted infections? The  only way to completely prevent STIs is not to have sex of any kind (practice abstinence). This includes oral, vaginal, or anal sex. If you are sexually active, take these actions to lower your risk of getting an STI:  Have only one sex partner (be monogamous) or limit the number of sexual partners you have.  Stay up-to-date on immunizations. Certain vaccines can lower your risk of getting certain STIs, such as: ? Hepatitis A and B vaccines. You may have been vaccinated as a young child, but likely need a booster shot as a teen or young adult. ? HPV vaccine. This vaccine is recommended if you are a man under age 12 or a woman under age 78.  Use methods that prevent the exchange of body fluids between partners (barrier protection) every time you have sex. Barrier protection can be used during oral, vaginal, or anal sex. Commonly used barrier methods include: ? Male condom. ? Male condom. ? Dental dam.  Get tested regularly  for STIs. Have your sexual partner get tested regularly as well.  Avoid mixing alcohol, drugs, and sex. Alcohol and drug use can affect your ability to make good decisions and can lead to risky sexual behaviors.  Ask your health care provider about taking pre-exposure prophylaxis (PrEP) to prevent HIV infection if you: ? Have a HIV-positive sexual partner. ? Have multiple sexual partners or partners who do not know their HIV status, and do not regularly use a condom during sex. ? Use injection drugs and share needles.  Birth control pills, injections, implants, and intrauterine devices (IUDs) do not protect against STIs. To prevent both STIs and pregnancy, always use a condom with another form of birth control. Some STIs, such as herpes, are spread through skin to skin contact. A condom does not protect you from getting such STIs. If you or your partner have herpes and there is an active flare with open sores, avoid all sexual contact. Why are these changes  important? Taking steps to practice safe sex protects you and others. Many STIs can be cured. However, some STIs are not curable and will affect you for the rest of your life. STIs can be passed on to another person even if you do not have symptoms. What can happen if changes are not made? Certain STIs may:  Require you to take medicine for the rest of your life.  Affect your ability to have children (your fertility).  Increase your risk for developing another STI or certain serious health conditions, such as: ? Cervical cancer. ? Head and neck cancer. ? Pelvic inflammatory disease (PID) in women. ? Organ damage or damage to other parts of your body, if the infection spreads.  Be passed to a baby during childbirth.  How are sexually transmitted infections treated? If you or your partner know or think that you may have an STI:  Talk with your healthcare provider about what can be done to treat it. Some STIs can be treated and cured with medicines.  For curable STIs, you and your partner should avoid sex during treatment and for several days after treatment is complete.  You and your partner should both be treated at the same time, if there is any chance that your partner is infected as well. If you get treatment but your partner does not, your partner can re-infect you when you resume sexual contact.  Do not have unprotected sex.  Where to find more information: Learn more about sexually transmitted diseases and infections from:  Centers for Disease Control and Prevention: ? More information about specific STIs: AppraiserFraud.fi ? Find places to get sexual health counseling and treatment for free or for a low cost: gettested.StoreMirror.com.cy  U.S. Department of Health and Human Services: http://white.info/.html  Summary  The only way to completely prevent STIs is not to have sex (practice abstinence), including  oral, vaginal, or anal sex.  STIs can spread through saliva, semen, blood, vaginal mucus, urine, or sexual contact.  If you do have sex, limit your number of sexual partners and use a barrier protection method every time you have sex.  If you develop an STI, get treated right away and ask your partner to be treated as well. Do not resume having sex until both of you have completed treatment for the STI. This information is not intended to replace advice given to you by your health care provider. Make sure you discuss any questions you have with your health care provider. Document Released: 04/16/2016 Document Revised:  04/16/2016 Document Reviewed: 04/16/2016 Elsevier Interactive Patient Education  [employer].

## 2017-07-07 NOTE — Progress Notes (Signed)
Subjective:  Patient ID: Anthony [name], male    DOB: 01/07/1966  Age: 52 y.o. MRN: [id number]  CC: discomfort in groin area   HPI XZAYVION VAETH presents for evaluation of a "funny feeling" in his groin area status post dry needling lower back pain last evening.  Funny feeling is further described as hot and cold sensations in the groin area with some urinary urgency.  He denies dysuria, frequency, discharge or rash.  He did have a sexual encounter with a new male partner 5 days ago.  Patient has a history of herpes and says that this feeling is different from prior outbreaks.  He was evaluated in the [place].  He would like to be checked for STDs.  Outpatient Medications Prior to Visit  Medication Sig Dispense Refill  . albuterol (PROVENTIL HFA;VENTOLIN HFA) 108 (90 Base) MCG/ACT inhaler Inhale 2 puffs into the lungs every 6 (six) hours as needed. For shortness of breath 1 Inhaler 1  . ARIPiprazole (ABILIFY) 10 MG tablet Take 10 mg by mouth daily as needed (anxiety).     . Brinzolamide-Brimonidine 1-0.2 % SUSP Place 1 drop into both eyes 2 (two) times daily.    . Bromfenac Sodium (PROLENSA) 0.07 % SOLN as directed. Reported on 09/07/2015    . chlorhexidine (PERIDEX) 0.12 % solution Use as directed 15 mLs in the mouth or throat 2 (two) times daily. After the symbicort inhaler - swish and spit 120 mL 0  . diclofenac sodium (VOLTAREN) 1 % GEL Apply 4 g topically 4 (four) times daily as needed. 400 g 2  . fluticasone (FLONASE) 50 MCG/ACT nasal spray Place 2 sprays into both nostrils daily as needed for allergies.    Marland Kitchen lidocaine (XYLOCAINE) 2 % solution Use 5 ml po as directed every 2 hours as needed for pain 200 mL 0  . mupirocin ointment (BACTROBAN) 2 % App to skin irritation tid prn 22 g 5  . PARoxetine (PAXIL) 10 MG tablet Take 10 mg by mouth daily.    . SYMBICORT 160-4.5 MCG/ACT inhaler Inhale 2 puffs into the lungs daily as needed (shortness of breath).     . valACYclovir (VALTREX) 500 MG  tablet Take 1 tablet (500 mg total) by mouth 2 (two) times daily. 90 tablet 3   No facility-administered medications prior to visit.     ROS Review of Systems  Constitutional: Negative.  Negative for chills, fatigue, fever and unexpected weight change.  Eyes: Negative for pain, discharge, redness and itching.  Respiratory: Negative.   Cardiovascular: Negative.   Gastrointestinal: Negative.   Endocrine: Negative for polyphagia and polyuria.  Genitourinary: Positive for urgency. Negative for decreased urine volume, difficulty urinating, discharge, dysuria, frequency, genital sores, hematuria, penile pain and testicular pain.  Musculoskeletal: Negative for arthralgias and myalgias.  Skin: Negative for color change and rash.  Allergic/Immunologic: Negative for immunocompromised state.    Objective:  BP 118/70 (BP Location: Left Arm, Patient Position: Sitting, Cuff Size: Normal)   Pulse 93   Ht 5' 8.43" (1.738 m)   Wt 217 lb 8 oz (98.7 kg)   SpO2 96%   BMI 32.66 kg/m   BP Readings from Last 3 Encounters:  07/07/17 118/70  03/18/17 126/78  03/16/17 132/88    Wt Readings from Last 3 Encounters:  07/07/17 217 lb 8 oz (98.7 kg)  03/18/17 218 lb 9.6 oz (99.2 kg)  03/16/17 221 lb (100.2 kg)    Physical Exam  Constitutional: He is oriented to person, place,  and time. He appears well-developed and well-nourished. No distress.  HENT:  Head: Normocephalic and atraumatic.  Right Ear: External ear normal.  Left Ear: External ear normal.  Eyes: Right eye exhibits no discharge. Left eye exhibits no discharge. No scleral icterus.  Pulmonary/Chest: Effort normal.  Abdominal: Hernia confirmed negative in the right inguinal area and confirmed negative in the left inguinal area.  Genitourinary: Penis normal.    Right testis shows no mass, no swelling and no tenderness. Right testis is descended. Left testis shows no mass, no swelling and no tenderness. Left testis is descended. No  phimosis, paraphimosis, hypospadias, penile erythema or penile tenderness. No discharge found.  Lymphadenopathy:       Right: No inguinal adenopathy present.       Left: No inguinal adenopathy present.  Neurological: He is alert and oriented to person, place, and time.  Skin: Skin is warm and dry. He is not diaphoretic.  Psychiatric: He has a normal mood and affect. His behavior is normal.    Lab Results  Component Value Date   WBC 5.7 06/12/2016   HGB 15.9 06/12/2016   HCT 47.2 06/12/2016   PLT 236.0 06/12/2016   GLUCOSE 101 (H) 07/27/2016   CHOL 209 (H) 06/12/2016   TRIG 126.0 06/12/2016   HDL 55.10 06/12/2016   LDLDIRECT 148.7 04/15/2012   LDLCALC 128 (H) 06/12/2016   ALT 22 07/27/2016   AST 17 07/27/2016   NA 140 07/27/2016   K 3.9 07/27/2016   CL 107 07/27/2016   CREATININE 1.03 07/27/2016   BUN 11 07/27/2016   CO2 26 07/27/2016   TSH 0.77 10/19/2014   PSA 0.47 12/02/2016   HGBA1C 5.8 11/27/2014    Dg Finger Thumb Right  Result Date: 11/13/2016 CLINICAL DATA:  RIGHT thumb pain at MCP joint for 5 days question injury while pulling something EXAM: RIGHT THUMB 2+V COMPARISON:  None FINDINGS: Osseous mineralization normal. Joint spaces preserved. No acute fracture, dislocation, or bone destruction. Soft tissues radiographically unremarkable. IMPRESSION: Normal exam. Electronically Signed   By: Lavonia Dana M.D.   On: 11/13/2016 19:48    Assessment & Plan:   Kemo was seen today for discomfort in groin area.  Diagnoses and all orders for this visit:  Exposure to blood or body fluid -     HIV antibody -     Urinalysis, Routine w reflex microscopic -     Urine cytology ancillary only   I am having Chazz L. Yeatman maintain his SYMBICORT, mupirocin ointment, Bromfenac Sodium, ARIPiprazole, PARoxetine, lidocaine, chlorhexidine, albuterol, fluticasone, Brinzolamide-Brimonidine, diclofenac sodium, and valACYclovir.  No orders of the defined types were placed in this  encounter.  Check for STDs and instructed patient to follow-up with his primary provider as he does have blood work ordered in the chart for a CPE.  Follow-up: No Follow-up on file.  Libby Maw, MD

## 2017-07-08 ENCOUNTER — Ambulatory Visit: Payer: Federal, State, Local not specified - PPO | Admitting: Internal Medicine

## 2017-07-08 ENCOUNTER — Other Ambulatory Visit (INDEPENDENT_AMBULATORY_CARE_PROVIDER_SITE_OTHER): Payer: Federal, State, Local not specified - PPO

## 2017-07-08 DIAGNOSIS — Z0000 Encounter for general adult medical examination without abnormal findings: Secondary | ICD-10-CM | POA: Diagnosis not present

## 2017-07-08 LAB — LIPID PANEL
Cholesterol: 198 mg/dL (ref 0–200)
HDL: 50.4 mg/dL (ref >39.00)
LDL Cholesterol: 119 mg/dL — ABNORMAL HIGH (ref 0–99)
NonHDL: 147.33
Total CHOL/HDL Ratio: 4
Triglycerides: 144 mg/dL (ref 0.0–149.0)
VLDL: 28.8 mg/dL (ref 0.0–40.0)

## 2017-07-08 LAB — CBC WITH DIFFERENTIAL/PLATELET
Basophils Absolute: 0.1 10*3/uL (ref 0.0–0.1)
Basophils Relative: 1.2 % (ref 0.0–3.0)
Eosinophils Absolute: 0.2 10*3/uL (ref 0.0–0.7)
Eosinophils Relative: 3.6 % (ref 0.0–5.0)
HCT: 45.2 % (ref 39.0–52.0)
Hemoglobin: 15.2 g/dL (ref 13.0–17.0)
Lymphocytes Relative: 32.1 % (ref 12.0–46.0)
Lymphs Abs: 1.7 10*3/uL (ref 0.7–4.0)
MCHC: 33.7 g/dL (ref 30.0–36.0)
MCV: 83.9 fl (ref 78.0–100.0)
Monocytes Absolute: 0.5 10*3/uL (ref 0.1–1.0)
Monocytes Relative: 9.2 % (ref 3.0–12.0)
Neutro Abs: 2.9 10*3/uL (ref 1.4–7.7)
Neutrophils Relative %: 53.9 % (ref 43.0–77.0)
Platelets: 244 10*3/uL (ref 150.0–400.0)
RBC: 5.39 Mil/uL (ref 4.22–5.81)
RDW: 15.7 % — ABNORMAL HIGH (ref 11.5–15.5)
WBC: 5.4 10*3/uL (ref 4.0–10.5)

## 2017-07-08 LAB — BASIC METABOLIC PANEL
BUN: 15 mg/dL (ref 6–23)
CO2: 26 mEq/L (ref 19–32)
Calcium: 9.6 mg/dL (ref 8.4–10.5)
Chloride: 105 mEq/L (ref 96–112)
Creatinine, Ser: 1.01 mg/dL (ref 0.40–1.50)
GFR: 100.37 mL/min (ref >60.00)
Glucose, Bld: 94 mg/dL (ref 70–99)
Potassium: 4.1 mEq/L (ref 3.5–5.1)
Sodium: 139 mEq/L (ref 135–145)

## 2017-07-08 LAB — HEPATIC FUNCTION PANEL
ALT: 21 U/L (ref 0–53)
AST: 15 U/L (ref 0–37)
Albumin: 4.3 g/dL (ref 3.5–5.2)
Alkaline Phosphatase: 66 U/L (ref 39–117)
Bilirubin, Direct: 0.1 mg/dL (ref 0.0–0.3)
Total Bilirubin: 0.7 mg/dL (ref 0.2–1.2)
Total Protein: 7 g/dL (ref 6.0–8.3)

## 2017-07-08 LAB — TSH: TSH: 0.8 u[IU]/mL (ref 0.35–4.50)

## 2017-07-08 LAB — HIV ANTIBODY (ROUTINE TESTING W REFLEX): HIV 1&2 Ab, 4th Generation: NONREACTIVE

## 2017-07-08 LAB — URINE CYTOLOGY ANCILLARY ONLY
Chlamydia: NEGATIVE
Neisseria Gonorrhea: NEGATIVE

## 2017-07-08 LAB — PSA: PSA: 0.51 ng/mL (ref 0.10–4.00)

## 2017-07-09 ENCOUNTER — Encounter: Payer: Self-pay | Admitting: Family Medicine

## 2017-07-09 ENCOUNTER — Encounter: Payer: Self-pay | Admitting: Internal Medicine

## 2017-07-10 ENCOUNTER — Encounter: Payer: Self-pay | Admitting: Internal Medicine

## 2017-07-10 ENCOUNTER — Ambulatory Visit: Payer: Federal, State, Local not specified - PPO | Admitting: Internal Medicine

## 2017-07-10 ENCOUNTER — Ambulatory Visit: Payer: Federal, State, Local not specified - PPO | Admitting: Family Medicine

## 2017-07-10 VITALS — BP 116/84 | HR 74 | Temp 97.6°F | Ht 68.0 in | Wt 218.0 lb

## 2017-07-10 DIAGNOSIS — J4530 Mild persistent asthma, uncomplicated: Secondary | ICD-10-CM

## 2017-07-10 DIAGNOSIS — R102 Pelvic and perineal pain: Secondary | ICD-10-CM

## 2017-07-10 DIAGNOSIS — J3089 Other allergic rhinitis: Secondary | ICD-10-CM | POA: Diagnosis not present

## 2017-07-10 DIAGNOSIS — J302 Other seasonal allergic rhinitis: Secondary | ICD-10-CM

## 2017-07-10 MED ORDER — DOXYCYCLINE HYCLATE 100 MG PO TABS: 100.0000 mg | ORAL_TABLET | Freq: Two times a day (BID) | ORAL | 0 refills | Status: DC

## 2017-07-10 NOTE — Patient Instructions (Addendum)
Please take all new medication as prescribed - the antibiotic  Please continue all other medications as before, and refills have been done if requested.  Please have the pharmacy call with any other refills you may need.  Please keep your appointments with your specialists as you may have planned   

## 2017-07-10 NOTE — Assessment & Plan Note (Signed)
Stable, to continue flonase asd,  to f/u any worsening symptoms or concerns

## 2017-07-10 NOTE — Assessment & Plan Note (Signed)
With high suspicion for acute prostatis, probable recurrent, unable to do udip at this time, will tx with empiric antibx, consider urology if not improved

## 2017-07-10 NOTE — Assessment & Plan Note (Signed)
Stable, cont current inhaler

## 2017-07-10 NOTE — Progress Notes (Addendum)
Subjective:    Patient ID: Anthony [name], male    DOB: 03-10-1966, 52 y.o.   MRN: [id number]  HPI  Here with vague symptoms of lower abd/pelvic discomfort, urinary urgency and frequency for several days also with scrotum that seems to have shrunk and pulled up. but Denies urinary symptoms such as dysuria, frequency, urgency, flank pain, hematuria or n/v, fever, chills. Has hx of prostatitis with PSA 16 several yrs ago, normalized post antibx.  Pt states this keeps happening about once per year, and is considering a referral to urology but does not want at this time.  Was seen per Dr Ethelene Hal 3/6 with negative evaluation for gc, chlamydia.  Also had routine labs 3/7 with normal WBC.   Denies worsening reflux, dysphagia, n/v, bowel change or blood.  Pt denies polydipsia, polyuria.  Pt denies chest pain, increased sob or doe, wheezing, orthopnea, PND, increased LE swelling, palpitations, dizziness or syncope.  Past Medical History:  Diagnosis Date  . Anemia   . Anxiety   . Asthma   . Cataract   . Depression   . DVT (deep venous thrombosis) ([place])   . Fatty liver   . H/O renal calculi 2006  . Hyperlipidemia   . Internal hemorrhoid   . Kidney stones   . PE (pulmonary embolism)   . Peptic ulcer disease   . Pneumonia   . Spondylosis   . Tubular adenoma of colon 2011   Dr Olevia Perches   Past Surgical History:  Procedure Laterality Date  . CATARACT EXTRACTION Left 1996   Dr Elonda Husky; [facility]  . colonoscopy with polypectomy  2011   Dr Olevia Perches, hemorrhoids  . CYSTOSCOPY W/ STONE MANIPULATION  2006  . INGUINAL HERNIA REPAIR Right 1990  . SHOULDER SURGERY Right 12/17/2010   Dr French Ana ; shoulder impingement & torn tendon    reports that  has never smoked. he has never used smokeless tobacco. He reports that he drinks alcohol. He reports that he does not use drugs. family history includes Cancer in his paternal grandmother; Diabetes in his father; Glaucoma in his father; Heart attack in his maternal uncle;  Heart attack (age of onset: 54) in his mother; Heart attack (age of onset: 25) in his maternal grandmother; Stroke (age of onset: 26) in his father. Allergies  Allergen Reactions  . Aleve [Naproxen] Rash    Patient stated it was years ago   Current Outpatient Medications on File Prior to Visit  Medication Sig Dispense Refill  . albuterol (PROVENTIL HFA;VENTOLIN HFA) 108 (90 Base) MCG/ACT inhaler Inhale 2 puffs into the lungs every 6 (six) hours as needed. For shortness of breath 1 Inhaler 1  . ARIPiprazole (ABILIFY) 10 MG tablet Take 10 mg by mouth daily as needed (anxiety).     . Brinzolamide-Brimonidine 1-0.2 % SUSP Place 1 drop into both eyes 2 (two) times daily.    . Bromfenac Sodium (PROLENSA) 0.07 % SOLN as directed. Reported on 09/07/2015    . chlorhexidine (PERIDEX) 0.12 % solution Use as directed 15 mLs in the mouth or throat 2 (two) times daily. After the symbicort inhaler - swish and spit 120 mL 0  . diclofenac sodium (VOLTAREN) 1 % GEL Apply 4 g topically 4 (four) times daily as needed. 400 g 2  . fluticasone (FLONASE) 50 MCG/ACT nasal spray Place 2 sprays into both nostrils daily as needed for allergies.    Marland Kitchen lidocaine (XYLOCAINE) 2 % solution Use 5 ml po as directed every 2 hours as  needed for pain 200 mL 0  . mupirocin ointment (BACTROBAN) 2 % App to skin irritation tid prn 22 g 5  . PARoxetine (PAXIL) 10 MG tablet Take 10 mg by mouth daily.    . SYMBICORT 160-4.5 MCG/ACT inhaler Inhale 2 puffs into the lungs daily as needed (shortness of breath).     . valACYclovir (VALTREX) 500 MG tablet Take 1 tablet (500 mg total) by mouth 2 (two) times daily. 90 tablet 3   No current facility-administered medications on file prior to visit.    Review of Systems  Constitutional: Negative for other unusual diaphoresis or sweats HENT: Negative for ear discharge or swelling Eyes: Negative for other worsening visual disturbances Respiratory: Negative for stridor or other swelling    Gastrointestinal: Negative for worsening distension or other blood Genitourinary: Negative for retention or other urinary change Musculoskeletal: Negative for other MSK pain or swelling Skin: Negative for color change or other new lesions Neurological: Negative for worsening tremors and other numbness  Psychiatric/Behavioral: Negative for worsening agitation or other fatigue All other system neg per pt    Objective:   Physical Exam BP 116/84   Pulse 74   Temp 97.6 F (36.4 C) (Oral)   Ht 5\' 8"  (1.727 m)   Wt 218 lb (98.9 kg)   SpO2 98%   BMI 33.15 kg/m  VS noted, non toxic Constitutional: Pt appears in NAD HENT: Head: NCAT.  Right Ear: External ear normal.  Left Ear: External ear normal.  Eyes: . Pupils are equal, round, and reactive to light. Conjunctivae and EOM are normal Nose: without d/c or deformity Neck: Neck supple. Gross normal ROM Cardiovascular: Normal rate and regular rhythm.   Pulmonary/Chest: Effort normal and breath sounds without rales or wheezing.  Abd:  Soft, NT, ND, + BS, no organomegaly, declines DRE Neurological: Pt is alert. At baseline orientation, motor grossly intact Skin: Skin is warm. No rashes, other new lesions, no LE edema Psychiatric: Pt behavior is normal without agitation  No other exam findings    Assessment & Plan:

## 2017-07-12 DIAGNOSIS — R51 Headache: Secondary | ICD-10-CM | POA: Diagnosis not present

## 2017-07-12 DIAGNOSIS — Z13220 Encounter for screening for lipoid disorders: Secondary | ICD-10-CM | POA: Diagnosis not present

## 2017-07-12 DIAGNOSIS — R300 Dysuria: Secondary | ICD-10-CM | POA: Diagnosis not present

## 2017-07-12 DIAGNOSIS — Z1321 Encounter for screening for nutritional disorder: Secondary | ICD-10-CM | POA: Diagnosis not present

## 2017-07-13 ENCOUNTER — Encounter: Payer: Self-pay | Admitting: Internal Medicine

## 2017-07-14 DIAGNOSIS — F4310 Post-traumatic stress disorder, unspecified: Secondary | ICD-10-CM | POA: Diagnosis not present

## 2017-07-28 DIAGNOSIS — F4310 Post-traumatic stress disorder, unspecified: Secondary | ICD-10-CM | POA: Diagnosis not present

## 2017-07-29 ENCOUNTER — Encounter: Payer: Self-pay | Admitting: Internal Medicine

## 2017-08-03 DIAGNOSIS — M545 Low back pain: Secondary | ICD-10-CM | POA: Diagnosis not present

## 2017-08-07 LAB — GLUCOSE, POCT (MANUAL RESULT ENTRY): POC Glucose: 103 mg/dl — AB (ref 70–99)

## 2017-08-11 DIAGNOSIS — F4310 Post-traumatic stress disorder, unspecified: Secondary | ICD-10-CM | POA: Diagnosis not present

## 2017-08-12 DIAGNOSIS — M545 Low back pain: Secondary | ICD-10-CM | POA: Diagnosis not present

## 2017-08-16 ENCOUNTER — Encounter: Payer: Self-pay | Admitting: Internal Medicine

## 2017-08-18 DIAGNOSIS — Z6832 Body mass index (BMI) 32.0-32.9, adult: Secondary | ICD-10-CM | POA: Diagnosis not present

## 2017-08-18 DIAGNOSIS — E669 Obesity, unspecified: Secondary | ICD-10-CM | POA: Diagnosis not present

## 2017-08-18 DIAGNOSIS — Z713 Dietary counseling and surveillance: Secondary | ICD-10-CM | POA: Diagnosis not present

## 2017-09-01 DIAGNOSIS — F4310 Post-traumatic stress disorder, unspecified: Secondary | ICD-10-CM | POA: Diagnosis not present

## 2017-09-10 DIAGNOSIS — Z961 Presence of intraocular lens: Secondary | ICD-10-CM | POA: Diagnosis not present

## 2017-09-10 DIAGNOSIS — H02402 Unspecified ptosis of left eyelid: Secondary | ICD-10-CM | POA: Diagnosis not present

## 2017-09-10 DIAGNOSIS — H2511 Age-related nuclear cataract, right eye: Secondary | ICD-10-CM | POA: Diagnosis not present

## 2017-09-10 DIAGNOSIS — H04123 Dry eye syndrome of bilateral lacrimal glands: Secondary | ICD-10-CM | POA: Diagnosis not present

## 2017-09-13 ENCOUNTER — Encounter: Payer: Self-pay | Admitting: Internal Medicine

## 2017-09-13 ENCOUNTER — Ambulatory Visit (INDEPENDENT_AMBULATORY_CARE_PROVIDER_SITE_OTHER): Payer: Federal, State, Local not specified - PPO | Admitting: Internal Medicine

## 2017-09-13 ENCOUNTER — Other Ambulatory Visit (INDEPENDENT_AMBULATORY_CARE_PROVIDER_SITE_OTHER): Payer: Federal, State, Local not specified - PPO

## 2017-09-13 VITALS — BP 118/76 | HR 93 | Temp 97.9°F | Ht 68.0 in | Wt 211.0 lb

## 2017-09-13 DIAGNOSIS — M62830 Muscle spasm of back: Secondary | ICD-10-CM | POA: Diagnosis not present

## 2017-09-13 DIAGNOSIS — Z0000 Encounter for general adult medical examination without abnormal findings: Secondary | ICD-10-CM | POA: Diagnosis not present

## 2017-09-13 DIAGNOSIS — E785 Hyperlipidemia, unspecified: Secondary | ICD-10-CM

## 2017-09-13 LAB — LIPID PANEL
Cholesterol: 201 mg/dL — ABNORMAL HIGH (ref 0–200)
HDL: 50.5 mg/dL (ref >39.00)
LDL Cholesterol: 112 mg/dL — ABNORMAL HIGH (ref 0–99)
NonHDL: 150.01
Total CHOL/HDL Ratio: 4
Triglycerides: 188 mg/dL — ABNORMAL HIGH (ref 0.0–149.0)
VLDL: 37.6 mg/dL (ref 0.0–40.0)

## 2017-09-13 MED ORDER — TIZANIDINE HCL 2 MG PO TABS: 2.0000 mg | ORAL_TABLET | Freq: Four times a day (QID) | ORAL | 2 refills | Status: DC | PRN

## 2017-09-13 NOTE — Patient Instructions (Signed)
Your EKG was Ok today  Please take all new medication as prescribed - the tizanidine muscle relaxer as needed  Please continue all other medications as before, and refills have been done if requested.  Please have the pharmacy call with any other refills you may need.  Please continue your efforts at being more active, low cholesterol diet, and weight control.  You are otherwise up to date with prevention measures today.  Please keep your appointments with your specialists as you may have planned  Please go to the LAB in the Basement (turn left off the elevator) for the tests to be done today  You will be contacted by phone if any changes need to be made immediately.  Otherwise, you will receive a letter about your results with an explanation, but please check with MyChart first.  Please return in 6 months, or sooner if needed

## 2017-09-13 NOTE — Progress Notes (Signed)
Subjective:    Patient ID: Anthony [name], male    DOB: 1966/03/28, 52 y.o.   MRN: [id number]  HPI  Here for wellness and f/u;  Overall doing ok;  Pt denies Chest pain, worsening SOB, DOE, wheezing, orthopnea, PND, worsening LE edema, palpitations, dizziness or syncope.  Pt denies neurological change such as new headache, facial or extremity weakness.  Pt denies polydipsia, polyuria, or low sugar symptoms. Pt states overall good compliance with treatment and medications, good tolerability, and has been trying to follow appropriate diet.  Pt denies worsening depressive symptoms, suicidal ideation or panic. No fever, night sweats, wt loss, loss of appetite, or other constitutional symptoms.  Pt states good ability with ADL's, has low fall risk, home safety reviewed and adequate, no other significant changes in hearing or vision, and only occasionally active with exercise.  Seeing therapist at [facility], next appt wed Doing elliptical and other at [facility] for 2 hrs per day. Pt continues to have recurring LBP without change in severity, bowel or bladder change, fever, wt loss,  worsening LE pain/numbness/weakness, gait change or falls. MRI neg for acute 2017.  No new complaints or interval change Past Medical History:  Diagnosis Date  . Anemia   . Anxiety   . Asthma   . Cataract   . Depression   . DVT (deep venous thrombosis) ([place])   . Fatty liver   . H/O renal calculi 2006  . Hyperlipidemia   . Internal hemorrhoid   . Kidney stones   . PE (pulmonary embolism)   . Peptic ulcer disease   . Pneumonia   . Spondylosis   . Tubular adenoma of colon 2011   Dr Olevia Perches   Past Surgical History:  Procedure Laterality Date  . CATARACT EXTRACTION Left 1996   Dr Elonda Husky; [facility]  . colonoscopy with polypectomy  2011   Dr Olevia Perches, hemorrhoids  . CYSTOSCOPY W/ STONE MANIPULATION  2006  . INGUINAL HERNIA REPAIR Right 1990  . SHOULDER SURGERY Right 12/17/2010   Dr French Ana ; shoulder impingement & torn tendon    reports  that he has never smoked. He has never used smokeless tobacco. He reports that he drinks alcohol. He reports that he does not use drugs. family history includes Cancer in his paternal grandmother; Diabetes in his father; Glaucoma in his father; Heart attack in his maternal uncle; Heart attack (age of onset: 69) in his mother; Heart attack (age of onset: 48) in his maternal grandmother; Stroke (age of onset: 110) in his father. Allergies  Allergen Reactions  . Aleve [Naproxen] Rash    Patient stated it was years ago   Current Outpatient Medications on File Prior to Visit  Medication Sig Dispense Refill  . albuterol (PROVENTIL HFA;VENTOLIN HFA) 108 (90 Base) MCG/ACT inhaler Inhale 2 puffs into the lungs every 6 (six) hours as needed. For shortness of breath 1 Inhaler 1  . ARIPiprazole (ABILIFY) 10 MG tablet Take 10 mg by mouth daily as needed (anxiety).     . Brinzolamide-Brimonidine 1-0.2 % SUSP Place 1 drop into both eyes 2 (two) times daily.    . Bromfenac Sodium (PROLENSA) 0.07 % SOLN as directed. Reported on 09/07/2015    . chlorhexidine (PERIDEX) 0.12 % solution Use as directed 15 mLs in the mouth or throat 2 (two) times daily. After the symbicort inhaler - swish and spit 120 mL 0  . diclofenac sodium (VOLTAREN) 1 % GEL Apply 4 g topically 4 (four) times daily as needed. [place]  g 2  . fluticasone (FLONASE) 50 MCG/ACT nasal spray Place 2 sprays into both nostrils daily as needed for allergies.    Marland Kitchen lidocaine (XYLOCAINE) 2 % solution Use 5 ml po as directed every 2 hours as needed for pain 200 mL 0  . mupirocin ointment (BACTROBAN) 2 % App to skin irritation tid prn 22 g 5  . PARoxetine (PAXIL) 10 MG tablet Take 10 mg by mouth daily.    . SYMBICORT 160-4.5 MCG/ACT inhaler Inhale 2 puffs into the lungs daily as needed (shortness of breath).     . valACYclovir (VALTREX) 500 MG tablet Take 1 tablet (500 mg total) by mouth 2 (two) times daily. 90 tablet 3   No current facility-administered medications  on file prior to visit.    Review of Systems Constitutional: Negative for other unusual diaphoresis, sweats, appetite or weight changes HENT: Negative for other worsening hearing loss, ear pain, facial swelling, mouth sores or neck stiffness.   Eyes: Negative for other worsening pain, redness or other visual disturbance.  Respiratory: Negative for other stridor or swelling Cardiovascular: Negative for other palpitations or other chest pain  Gastrointestinal: Negative for worsening diarrhea or loose stools, blood in stool, distention or other pain Genitourinary: Negative for hematuria, flank pain or other change in urine volume.  Musculoskeletal: Negative for myalgias or other joint swelling.  Skin: Negative for other color change, or other wound or worsening drainage.  Neurological: Negative for other syncope or numbness. Hematological: Negative for other adenopathy or swelling Psychiatric/Behavioral: Negative for hallucinations, other worsening agitation, SI, self-injury, or new decreased concentration All other system neg per pt    Objective:   Physical Exam BP 118/76   Pulse 93   Temp 97.9 F (36.6 C) (Oral)   Ht 5\' 8"  (1.727 m)   Wt 211 lb (95.7 kg)   SpO2 98%   BMI 32.08 kg/m  VS noted,  Constitutional: Pt is oriented to person, place, and time. Appears well-developed and well-nourished, in no significant distress and comfortable Head: Normocephalic and atraumatic  Eyes: Conjunctivae and EOM are normal. Pupils are equal, round, and reactive to light Right Ear: External ear normal without discharge Left Ear: External ear normal without discharge Nose: Nose without discharge or deformity Mouth/Throat: Oropharynx is without other ulcerations and moist  Neck: Normal range of motion. Neck supple. No JVD present. No tracheal deviation present or significant neck LA or mass Cardiovascular: Normal rate, regular rhythm, normal heart sounds and intact distal pulses.     Pulmonary/Chest: WOB normal and breath sounds without rales or wheezing  Abdominal: Soft. Bowel sounds are normal. NT. No HSM  Musculoskeletal: Normal range of motion. Exhibits no edema Lymphadenopathy: Has no other cervical adenopathy.  Neurological: Pt is alert and oriented to person, place, and time. Pt has normal reflexes. No cranial nerve deficit. Motor grossly intact, Gait intact Skin: Skin is warm and dry. No rash noted or new ulcerations Psychiatric:  Has normal mood and affect. Behavior is normal without agitation No other exam findings  ECG today I have personally interpreted NSR 69    Assessment & Plan:

## 2017-09-14 ENCOUNTER — Encounter: Payer: Self-pay | Admitting: Internal Medicine

## 2017-09-14 MED ORDER — CYCLOBENZAPRINE HCL 5 MG PO TABS: 5.0000 mg | ORAL_TABLET | Freq: Three times a day (TID) | ORAL | 1 refills | Status: DC | PRN

## 2017-09-14 NOTE — Assessment & Plan Note (Signed)

## 2017-09-14 NOTE — Assessment & Plan Note (Signed)
D/w pt, declines statin, cont low chol diet, for lipids with labs

## 2017-09-14 NOTE — Assessment & Plan Note (Signed)
Ok for muscle relaxer prn,  to f/u any worsening symptoms or concerns 

## 2017-09-15 DIAGNOSIS — F4310 Post-traumatic stress disorder, unspecified: Secondary | ICD-10-CM | POA: Diagnosis not present

## 2017-09-29 DIAGNOSIS — F4310 Post-traumatic stress disorder, unspecified: Secondary | ICD-10-CM | POA: Diagnosis not present

## 2017-09-29 DIAGNOSIS — K0262 Dental caries on smooth surface penetrating into dentin: Secondary | ICD-10-CM | POA: Diagnosis not present

## 2017-10-04 DIAGNOSIS — K023 Arrested dental caries: Secondary | ICD-10-CM | POA: Diagnosis not present

## 2017-10-04 DIAGNOSIS — K036 Deposits [accretions] on teeth: Secondary | ICD-10-CM | POA: Diagnosis not present

## 2017-10-04 DIAGNOSIS — K05312 Chronic periodontitis, localized, moderate: Secondary | ICD-10-CM | POA: Diagnosis not present

## 2017-10-05 DIAGNOSIS — H40023 Open angle with borderline findings, high risk, bilateral: Secondary | ICD-10-CM | POA: Diagnosis not present

## 2017-10-05 DIAGNOSIS — H2511 Age-related nuclear cataract, right eye: Secondary | ICD-10-CM | POA: Diagnosis not present

## 2017-10-05 DIAGNOSIS — Z961 Presence of intraocular lens: Secondary | ICD-10-CM | POA: Diagnosis not present

## 2017-10-05 DIAGNOSIS — H524 Presbyopia: Secondary | ICD-10-CM | POA: Diagnosis not present

## 2017-10-05 DIAGNOSIS — H25011 Cortical age-related cataract, right eye: Secondary | ICD-10-CM | POA: Diagnosis not present

## 2017-10-13 DIAGNOSIS — F4310 Post-traumatic stress disorder, unspecified: Secondary | ICD-10-CM | POA: Diagnosis not present

## 2017-10-18 DIAGNOSIS — H02403 Unspecified ptosis of bilateral eyelids: Secondary | ICD-10-CM | POA: Diagnosis not present

## 2017-10-20 DIAGNOSIS — Z131 Encounter for screening for diabetes mellitus: Secondary | ICD-10-CM | POA: Diagnosis not present

## 2017-10-27 DIAGNOSIS — F4310 Post-traumatic stress disorder, unspecified: Secondary | ICD-10-CM | POA: Diagnosis not present

## 2017-11-04 ENCOUNTER — Encounter: Payer: Self-pay | Admitting: Internal Medicine

## 2017-11-14 ENCOUNTER — Encounter (HOSPITAL_COMMUNITY): Payer: Self-pay | Admitting: Emergency Medicine

## 2017-11-14 ENCOUNTER — Other Ambulatory Visit: Payer: Self-pay

## 2017-11-14 ENCOUNTER — Ambulatory Visit (HOSPITAL_COMMUNITY)
Admission: EM | Admit: 2017-11-14 | Discharge: 2017-11-14 | Disposition: A | Discharge status: Home / Self Care | Payer: Federal, State, Local not specified - PPO

## 2017-11-14 DIAGNOSIS — S46812A Strain of other muscles, fascia and tendons at shoulder and upper arm level, left arm, initial encounter: Secondary | ICD-10-CM

## 2017-11-14 DIAGNOSIS — M5412 Radiculopathy, cervical region: Secondary | ICD-10-CM

## 2017-11-14 MED ORDER — DICLOFENAC POTASSIUM 50 MG PO TABS: 50.0000 mg | ORAL_TABLET | Freq: Two times a day (BID) | ORAL | 0 refills | Status: DC

## 2017-11-14 MED ORDER — CYCLOBENZAPRINE HCL 5 MG PO TABS: 5.0000 mg | ORAL_TABLET | Freq: Three times a day (TID) | ORAL | 1 refills | Status: DC | PRN

## 2017-11-14 NOTE — ED Provider Notes (Signed)
11/14/2017 11:56 AM   DOB: 1965-05-10 / MRN: [id number]  SUBJECTIVE:  Anthony [name] is a 52 y.o. male presenting for left sided arm paresthesia that started 2 days ago.  Symptoms worsening. Associates paresthesia in the left thumb and index. Preceeded by "crick in the neck" two weeks ago which lasted a few days.  No CP, SOB, DOE, orthopnea, leg swelling, diaphroresis, dizzyness, weakness, change in function.   He is allergic to aleve [naproxen].   He  has a past medical history of Anemia, Anxiety, Asthma, Cataract, Depression, DVT (deep venous thrombosis) ([place]), Fatty liver, H/O renal calculi (2006), Hyperlipidemia, Internal hemorrhoid, Kidney stones, PE (pulmonary embolism), Peptic ulcer disease, Pneumonia, Spondylosis, and Tubular adenoma of colon (2011).    He  reports that he has never smoked. He has never used smokeless tobacco. He reports that he drinks alcohol. He reports that he does not use drugs. He  reports that he currently engages in sexual activity. The patient  has a past surgical history that includes Cystoscopy w/ stone manipulation (2006); Shoulder surgery (Right, 12/17/2010); Inguinal hernia repair (Right, 1990); colonoscopy with polypectomy (2011); and Cataract extraction (Left, 1996).  His family history includes Cancer in his paternal grandmother; Diabetes in his father; Glaucoma in his father; Heart attack in his maternal uncle; Heart attack (age of onset: 52) in his mother; Heart attack (age of onset: 54) in his maternal grandmother; Stroke (age of onset: 46) in his father.  ROS Per HPI.   OBJECTIVE:  BP 105/79 (BP Location: Left Arm)   Pulse 72   Temp 97.8 F (36.6 C) (Oral)   Resp 18   SpO2 99%   Wt Readings from Last 3 Encounters:  09/13/17 211 lb (95.7 kg)  07/10/17 218 lb (98.9 kg)  07/07/17 217 lb 8 oz (98.7 kg)   Temp Readings from Last 3 Encounters:  11/14/17 97.8 F (36.6 C) (Oral)  09/13/17 97.9 F (36.6 C) (Oral)  07/10/17 97.6 F (36.4 C) (Oral)     BP Readings from Last 3 Encounters:  11/14/17 105/79  09/13/17 118/76  08/07/17 125/73   Pulse Readings from Last 3 Encounters:  11/14/17 72  09/13/17 93  08/07/17 92    Physical Exam  Constitutional: He is oriented to person, place, and time. He appears well-developed. He does not appear ill.  Eyes: Pupils are equal, round, and reactive to light. Conjunctivae and EOM are normal.  Cardiovascular: Normal rate, regular rhythm, S1 normal, S2 normal, normal heart sounds, intact distal pulses and normal pulses. Exam reveals no gallop and no friction rub.  No murmur heard. Pulmonary/Chest: Effort normal. No stridor. No respiratory distress. He has no wheezes. He has no rales.  Abdominal: He exhibits no distension.  Musculoskeletal: Normal range of motion. He exhibits no edema.       Cervical back: Normal.       Back:  Neurological: He is alert and oriented to person, place, and time. He displays no atrophy, no tremor and normal reflexes. No cranial nerve deficit or sensory deficit. He exhibits normal muscle tone. He displays no seizure activity. Coordination normal.  Skin: Skin is warm and dry. He is not diaphoretic.  Psychiatric: He has a normal mood and affect.  Nursing note and vitals reviewed.   No results found for this or any previous visit (from the past 72 hour(s)).  No results found.  ASSESSMENT AND PLAN:   Strain of left trapezius muscle, initial encounter  Cervical radiculitis    Discharge Instructions  Start the diclofenac and flexeril.  Follow up with your PCP or come back if you continue to have problems.  Go to the ED for any of the other symptoms that we discussed.         The patient is advised to call or return to clinic if he does not see an improvement in symptoms, or to seek the care of the closest emergency department if he worsens with the above plan.   Philis Fendt, MHS, PA-C 11/14/2017 11:56 AM   Tereasa Coop, PA-C 11/14/17  [id number]

## 2017-11-14 NOTE — Discharge Instructions (Signed)
Start the diclofenac and flexeril.  Follow up with your PCP or come back if you continue to have problems.  Go to the ED for any of the other symptoms that we discussed.

## 2017-11-14 NOTE — ED Triage Notes (Addendum)
Patient noticed bilateral index fingers and thumbs are feeling numb, altered sensation. "jittery feeling" in left forearm.  Denies chest pain  Patient refers to the sensation as "nerve pin"  Touching his arms feels odd and positioning of arms triggers sensation

## 2017-11-22 DIAGNOSIS — H02402 Unspecified ptosis of left eyelid: Secondary | ICD-10-CM | POA: Diagnosis not present

## 2017-12-06 ENCOUNTER — Encounter: Payer: Self-pay | Admitting: Internal Medicine

## 2017-12-06 DIAGNOSIS — M7731 Calcaneal spur, right foot: Secondary | ICD-10-CM | POA: Diagnosis not present

## 2017-12-06 DIAGNOSIS — M898X7 Other specified disorders of bone, ankle and foot: Secondary | ICD-10-CM | POA: Diagnosis not present

## 2017-12-06 MED ORDER — TRAMADOL HCL 50 MG PO TABS: 50.0000 mg | ORAL_TABLET | Freq: Three times a day (TID) | ORAL | 0 refills | Status: DC | PRN

## 2017-12-15 DIAGNOSIS — F4310 Post-traumatic stress disorder, unspecified: Secondary | ICD-10-CM | POA: Diagnosis not present

## 2017-12-29 ENCOUNTER — Encounter: Payer: Self-pay | Admitting: Internal Medicine

## 2017-12-29 DIAGNOSIS — H524 Presbyopia: Secondary | ICD-10-CM | POA: Diagnosis not present

## 2017-12-29 DIAGNOSIS — F4310 Post-traumatic stress disorder, unspecified: Secondary | ICD-10-CM | POA: Diagnosis not present

## 2018-01-04 DIAGNOSIS — M545 Low back pain: Secondary | ICD-10-CM | POA: Diagnosis not present

## 2018-01-10 DIAGNOSIS — Z13220 Encounter for screening for lipoid disorders: Secondary | ICD-10-CM | POA: Diagnosis not present

## 2018-01-10 DIAGNOSIS — Z125 Encounter for screening for malignant neoplasm of prostate: Secondary | ICD-10-CM | POA: Diagnosis not present

## 2018-01-10 DIAGNOSIS — M21619 Bunion of unspecified foot: Secondary | ICD-10-CM | POA: Diagnosis not present

## 2018-01-10 DIAGNOSIS — R51 Headache: Secondary | ICD-10-CM | POA: Diagnosis not present

## 2018-01-10 DIAGNOSIS — G43909 Migraine, unspecified, not intractable, without status migrainosus: Secondary | ICD-10-CM | POA: Diagnosis not present

## 2018-01-10 DIAGNOSIS — M545 Low back pain: Secondary | ICD-10-CM | POA: Diagnosis not present

## 2018-01-10 DIAGNOSIS — M79673 Pain in unspecified foot: Secondary | ICD-10-CM | POA: Diagnosis not present

## 2018-01-19 DIAGNOSIS — M2141 Flat foot [pes planus] (acquired), right foot: Secondary | ICD-10-CM | POA: Diagnosis not present

## 2018-01-19 DIAGNOSIS — M2011 Hallux valgus (acquired), right foot: Secondary | ICD-10-CM | POA: Diagnosis not present

## 2018-01-24 ENCOUNTER — Encounter: Payer: Self-pay | Admitting: Internal Medicine

## 2018-01-24 ENCOUNTER — Other Ambulatory Visit: Payer: Federal, State, Local not specified - PPO

## 2018-01-24 ENCOUNTER — Ambulatory Visit: Payer: Federal, State, Local not specified - PPO | Admitting: Internal Medicine

## 2018-01-24 ENCOUNTER — Other Ambulatory Visit (INDEPENDENT_AMBULATORY_CARE_PROVIDER_SITE_OTHER): Payer: Federal, State, Local not specified - PPO

## 2018-01-24 VITALS — BP 110/70 | HR 80 | Temp 97.9°F | Ht 68.0 in | Wt 206.0 lb

## 2018-01-24 DIAGNOSIS — E785 Hyperlipidemia, unspecified: Secondary | ICD-10-CM | POA: Diagnosis not present

## 2018-01-24 DIAGNOSIS — R739 Hyperglycemia, unspecified: Secondary | ICD-10-CM | POA: Insufficient documentation

## 2018-01-24 DIAGNOSIS — M791 Myalgia, unspecified site: Secondary | ICD-10-CM | POA: Diagnosis not present

## 2018-01-24 DIAGNOSIS — G894 Chronic pain syndrome: Secondary | ICD-10-CM | POA: Diagnosis not present

## 2018-01-24 DIAGNOSIS — M545 Low back pain: Secondary | ICD-10-CM | POA: Diagnosis not present

## 2018-01-24 DIAGNOSIS — R197 Diarrhea, unspecified: Secondary | ICD-10-CM | POA: Diagnosis not present

## 2018-01-24 LAB — BASIC METABOLIC PANEL
BUN: 12 mg/dL (ref 6–23)
CO2: 27 mEq/L (ref 19–32)
Calcium: 9.2 mg/dL (ref 8.4–10.5)
Chloride: 104 mEq/L (ref 96–112)
Creatinine, Ser: 1.01 mg/dL (ref 0.40–1.50)
GFR: 100.15 mL/min (ref >60.00)
Glucose, Bld: 97 mg/dL (ref 70–99)
Potassium: 3.5 mEq/L (ref 3.5–5.1)
Sodium: 139 mEq/L (ref 135–145)

## 2018-01-24 LAB — HEMOGLOBIN A1C: Hgb A1c MFr Bld: 6 % (ref 4.6–6.5)

## 2018-01-24 NOTE — Progress Notes (Signed)
Subjective:    Patient ID: Anthony [name], male    DOB: 1965-09-09, 52 y.o.   MRN: [id number]  HPI  Here with 2 days onset loose stool (not watery diarrhea) with multiple episodes bowel movement frequency, but Denies worsening reflux, abd pain, dysphagia, n/v, or blood or fever. This was just after exposure to food at the chicken festival in [place].  Denies worsening reflux, abd pain, dysphagia, n/v, bowel change or blood.  Pt denies chest pain, increased sob or doe, wheezing, orthopnea, PND, increased LE swelling, palpitations, dizziness or syncope.   Pt denies polydipsia, polyuria Past Medical History:  Diagnosis Date  . Anemia   . Anxiety   . Asthma   . Cataract   . Depression   . DVT (deep venous thrombosis) ([place])   . Fatty liver   . H/O renal calculi 2006  . Hyperlipidemia   . Internal hemorrhoid   . Kidney stones   . PE (pulmonary embolism)   . Peptic ulcer disease   . Pneumonia   . Spondylosis   . Tubular adenoma of colon 2011   Dr Olevia Perches   Past Surgical History:  Procedure Laterality Date  . CATARACT EXTRACTION Left 1996   Dr Elonda Husky; [facility]  . colonoscopy with polypectomy  2011   Dr Olevia Perches, hemorrhoids  . CYSTOSCOPY W/ STONE MANIPULATION  2006  . INGUINAL HERNIA REPAIR Right 1990  . SHOULDER SURGERY Right 12/17/2010   Dr French Ana ; shoulder impingement & torn tendon    reports that he has never smoked. He has never used smokeless tobacco. He reports that he drinks alcohol. He reports that he does not use drugs. family history includes Cancer in his paternal grandmother; Diabetes in his father; Glaucoma in his father; Heart attack in his maternal uncle; Heart attack (age of onset: 25) in his mother; Heart attack (age of onset: 42) in his maternal grandmother; Stroke (age of onset: 52) in his father. Allergies  Allergen Reactions  . Aleve [Naproxen] Rash    Patient stated it was years ago   Current Outpatient Medications on File Prior to Visit  Medication Sig Dispense  Refill  . albuterol (PROVENTIL HFA;VENTOLIN HFA) 108 (90 Base) MCG/ACT inhaler Inhale 2 puffs into the lungs every 6 (six) hours as needed. For shortness of breath 1 Inhaler 1  . ARIPiprazole (ABILIFY) 10 MG tablet Take 10 mg by mouth daily as needed (anxiety).     Marland Kitchen aspirin EC 81 MG tablet Take 81 mg by mouth daily.    . Brinzolamide-Brimonidine 1-0.2 % SUSP Place 1 drop into both eyes 2 (two) times daily.    . Bromfenac Sodium (PROLENSA) 0.07 % SOLN as directed. Reported on 09/07/2015    . cyclobenzaprine (FLEXERIL) 5 MG tablet Take 1 tablet (5 mg total) by mouth 3 (three) times daily as needed for muscle spasms. 60 tablet 1  . diclofenac (CATAFLAM) 50 MG tablet Take 1 tablet (50 mg total) by mouth 2 (two) times daily. 20 tablet 0  . diclofenac sodium (VOLTAREN) 1 % GEL Apply 4 g topically 4 (four) times daily as needed. 400 g 2  . fluticasone (FLONASE) 50 MCG/ACT nasal spray Place 2 sprays into both nostrils daily as needed for allergies.    Marland Kitchen lidocaine (XYLOCAINE) 2 % solution Use 5 ml po as directed every 2 hours as needed for pain 200 mL 0  . PARoxetine (PAXIL) 10 MG tablet Take 10 mg by mouth daily.    . SYMBICORT 160-4.5 MCG/ACT inhaler  Inhale 2 puffs into the lungs daily as needed (shortness of breath).     . traMADol (ULTRAM) 50 MG tablet Take 1 tablet (50 mg total) by mouth every 8 (eight) hours as needed. 30 tablet 0  . valACYclovir (VALTREX) 500 MG tablet Take 1 tablet (500 mg total) by mouth 2 (two) times daily. 90 tablet 3   No current facility-administered medications on file prior to visit.    Review of Systems  Constitutional: Negative for other unusual diaphoresis or sweats HENT: Negative for ear discharge or swelling Eyes: Negative for other worsening visual disturbances Respiratory: Negative for stridor or other swelling  Gastrointestinal: Negative for worsening distension or other blood Genitourinary: Negative for retention or other urinary change Musculoskeletal:  Negative for other MSK pain or swelling Skin: Negative for color change or other new lesions Neurological: Negative for worsening tremors and other numbness  Psychiatric/Behavioral: Negative for worsening agitation or other fatigue All other system neg per pt    Objective:   Physical Exam BP 110/70 (BP Location: Left Arm, Patient Position: Sitting, Cuff Size: Normal)   Pulse 80   Temp 97.9 F (36.6 C) (Oral)   Ht 5\' 8"  (1.727 m)   Wt 206 lb (93.4 kg)   SpO2 97%   BMI 31.32 kg/m  VS noted,  Constitutional: Pt appears in NAD HENT: Head: NCAT.  Right Ear: External ear normal.  Left Ear: External ear normal.  Eyes: . Pupils are equal, round, and reactive to light. Conjunctivae and EOM are normal Nose: without d/c or deformity Neck: Neck supple. Gross normal ROM Cardiovascular: Normal rate and regular rhythm.   Pulmonary/Chest: Effort normal and breath sounds without rales or wheezing.  Abd:  Soft, NT, ND, + BS, no organomegaly Neurological: Pt is alert. At baseline orientation, motor grossly intact Skin: Skin is warm. No rashes, other new lesions, no LE edema Psychiatric: Pt behavior is normal without agitation No other exam fidnings    Assessment & Plan:

## 2018-01-24 NOTE — Assessment & Plan Note (Addendum)
Mild, for GI panel, stool culture, BMP/cbc today,  to f/u any worsening symptoms or concerns'

## 2018-01-24 NOTE — Patient Instructions (Signed)

## 2018-01-24 NOTE — Assessment & Plan Note (Addendum)
stable overall by history and exam, recent data reviewed with pt, and pt to continue medical treatment as before,  to f/u any worsening symptoms or concerns Lab Results  Component Value Date   LDLCALC 112 (H) 09/13/2017  for lower chol diet

## 2018-01-24 NOTE — Assessment & Plan Note (Signed)
stable overall by history and exam, recent data reviewed with pt, and pt to continue medical treatment as before,  to f/u any worsening symptoms or concerns Lab Results  Component Value Date   HGBA1C 6.0 01/24/2018

## 2018-01-26 ENCOUNTER — Encounter: Payer: Self-pay | Admitting: Internal Medicine

## 2018-01-26 DIAGNOSIS — F4310 Post-traumatic stress disorder, unspecified: Secondary | ICD-10-CM | POA: Diagnosis not present

## 2018-01-28 LAB — GASTROINTESTINAL PATHOGEN PANEL PCR
C. difficile Tox A/B, PCR: NOT DETECTED
Campylobacter, PCR: NOT DETECTED
Cryptosporidium, PCR: NOT DETECTED
E coli (ETEC) LT/ST PCR: NOT DETECTED
E coli (STEC) stx1/stx2, PCR: NOT DETECTED
E coli 0157, PCR: NOT DETECTED
Giardia lamblia, PCR: NOT DETECTED
Norovirus, PCR: NOT DETECTED
Rotavirus A, PCR: NOT DETECTED
Salmonella, PCR: NOT DETECTED
Shigella, PCR: NOT DETECTED

## 2018-01-28 LAB — STOOL CULTURE
MICRO NUMBER:: 91139607
MICRO NUMBER:: 91139608
MICRO NUMBER:: 91139609
SHIGA RESULT:: NOT DETECTED
SPECIMEN QUALITY:: ADEQUATE
SPECIMEN QUALITY:: ADEQUATE
SPECIMEN QUALITY:: ADEQUATE

## 2018-02-10 ENCOUNTER — Encounter: Payer: Self-pay | Admitting: Internal Medicine

## 2018-02-10 ENCOUNTER — Ambulatory Visit: Payer: Federal, State, Local not specified - PPO | Admitting: Internal Medicine

## 2018-02-10 DIAGNOSIS — G4733 Obstructive sleep apnea (adult) (pediatric): Secondary | ICD-10-CM

## 2018-02-10 DIAGNOSIS — J4530 Mild persistent asthma, uncomplicated: Secondary | ICD-10-CM | POA: Diagnosis not present

## 2018-02-10 NOTE — Patient Instructions (Addendum)
Ok to continue Symbicort and albuterol  You can ask your [facility] physician about referral to [place] as sleep doctors to reassess your sleep apnea  Please call if we can help

## 2018-02-10 NOTE — Progress Notes (Signed)
HPI male never smoker followed for allergic rhinitis, asthma, complicated by history PE/DVT 2016, peptic ulcer, depression (VAH), glaucoma Allergy Profile 09/25/2011-total IgE 159.6 with multiple specific elevations especially for grass pollens, molds, tree pollens and ragweed. PFT: 10/08/2011-mild obstructive airways disease-small airways, insignificant response to bronchodilator Hypercoag panel 04/26/15- incr Protein C,/ managed by Elna Breslow, [place] He brings [facility] records indicating wheezing and bronchospasm "probably secondary to pneumonia" in 1988 when he was in service in [place]. He was diagnosed with asthma and bronchopneumonia at that time. He says he had no asthma before that pneumonia and he denies family history of asthma. He requests a letter supporting his argument that asthma developed as a consequence of pneumonia which occurred while he was in [employer] service.  ---------------------------------------------------------------------------------------  02/10/17- 52 year old male never smoker followed for allergic rhinitis, asthma, complicated by history PE/DVT 2016, peptic ulcer, depression (VAH), glaucoma, OSA ([facility]) -----Asthma; Pt notes waking up gasping for air and Short winded. Had sleep study through [place] and be set up with CPAP soon. Pt notes wheezing and SOB with sneezing lately as well. Denies any cough or congestion. Describes waking with a choking sensation on a few occasions. Never aware of reflux/heartburn and doesn't notice sustained wheezing after these waking episodes to suggest asthma. CXR 11/13/16-  Normal exam  02/10/2018- 52 year old male never smoker followed for allergic rhinitis, asthma, complicated by history PE/DVT 2016, peptic ulcer, depression (VAH), glaucoma, OSA ([facility]) -----Follows for: asthma, had some flare ups but albuterol helped, still using symbicort Symbicort 160, albuterol HFA, Flonase Daily Symbicort.  Uses rescue inhaler about 1 time a  week.  No recent exacerbation. [facility] but with no recent attention and he has dropped off CPAP. I encouraged him to speak with his [place] primary physician about this.  With their referral, we could reassess and manage this problem if desired.  He is aware of snoring and at least occasional daytime sleepiness.  ROS-see HPI + = positive Constitutional:   No-   weight loss, night sweats, fevers, chills, fatigue, lassitude. HEENT:   No-  headaches, difficulty swallowing, tooth/dental problems, sore throat,    no-sneezing, no-itching, ear ache, no- nasal congestion, post nasal drip,  CV:  No--chest pain, No- orthopnea, PND, swelling in lower extremities, anasarca, dizziness, palpitations Resp: +shortness of breath with exertion or at rest.              No-  productive cough,  No non-productive cough,  No- coughing up of blood.                change in color of mucus.  No- wheezing.   Skin:  GI:  No-   heartburn, indigestion, abdominal pain, nausea, vomiting, GU:, MS:  No-   joint pain or swelling.   Neuro-     nothing unusual Psych:  No- change in mood or affect. No depression or anxiety.  No memory loss.  OBJ- Physical Exam General- Alert, Oriented, Affect-appropriate, Distress- none acute Skin- + mild hyperkeratosis on anterior chest Lymphadenopathy- none Head- atraumatic            Eyes- Gross vision intact, PERRLA, conjunctivae and secretions clear            Ears- Hearing, canals-normal            Nose-no-sniffing, no-Septal dev, polyps, erosion, perforation             Throat- Mallampati III , mucosa clear , drainage- none, tonsils- atrophic  Neck- flexible , trachea midline, no stridor , thyroid nl, carotid no bruit Chest - symmetrical excursion , unlabored           Heart/CV- RRR , no murmur , no gallop  , no rub, nl s1 s2                           - JVD- none , edema- none, stasis changes- none, varices- none           Lung- clear to P&A, wheeze-none, cough-  none , dullness-none, rub- none           Chest wall-  Abd-  Br/ Gen/ Rectal- Not done, not indicated Extrem- cyanosis- none, clubbing, none, atrophy- none, strength- nl, negative Homan's Neuro- grossly intact to observation

## 2018-02-13 DIAGNOSIS — G4733 Obstructive sleep apnea (adult) (pediatric): Secondary | ICD-10-CM | POA: Insufficient documentation

## 2018-02-13 NOTE — Assessment & Plan Note (Addendum)
Controll has been good with Symbicort and occasional use of rescue inhaler.  No indication to change treatment strategy. Refused flu shot

## 2018-02-13 NOTE — Assessment & Plan Note (Signed)
He was being assessed and managed by the [place] but they have not been following up on it and he dropped off CPAP.  We can help if needed.  I asked him to discuss it with his PCP.

## 2018-02-17 ENCOUNTER — Other Ambulatory Visit (INDEPENDENT_AMBULATORY_CARE_PROVIDER_SITE_OTHER): Payer: Federal, State, Local not specified - PPO

## 2018-02-17 ENCOUNTER — Encounter: Payer: Self-pay | Admitting: Internal Medicine

## 2018-02-17 ENCOUNTER — Ambulatory Visit: Payer: Federal, State, Local not specified - PPO | Admitting: Family Medicine

## 2018-02-17 ENCOUNTER — Encounter: Payer: Self-pay | Admitting: Family Medicine

## 2018-02-17 VITALS — BP 116/78 | HR 94 | Temp 97.6°F | Ht 67.0 in | Wt 202.0 lb

## 2018-02-17 DIAGNOSIS — R1011 Right upper quadrant pain: Secondary | ICD-10-CM

## 2018-02-17 LAB — CBC WITH DIFFERENTIAL/PLATELET
Basophils Absolute: 0 10*3/uL (ref 0.0–0.1)
Basophils Relative: 0.9 % (ref 0.0–3.0)
Eosinophils Absolute: 0.2 10*3/uL (ref 0.0–0.7)
Eosinophils Relative: 4.5 % (ref 0.0–5.0)
HCT: 46.8 % (ref 39.0–52.0)
Hemoglobin: 15.4 g/dL (ref 13.0–17.0)
Lymphocytes Relative: 36.9 % (ref 12.0–46.0)
Lymphs Abs: 2 10*3/uL (ref 0.7–4.0)
MCHC: 32.9 g/dL (ref 30.0–36.0)
MCV: 84.3 fl (ref 78.0–100.0)
Monocytes Absolute: 0.5 10*3/uL (ref 0.1–1.0)
Monocytes Relative: 9.4 % (ref 3.0–12.0)
Neutro Abs: 2.6 10*3/uL (ref 1.4–7.7)
Neutrophils Relative %: 48.3 % (ref 43.0–77.0)
Platelets: 249 10*3/uL (ref 150.0–400.0)
RBC: 5.55 Mil/uL (ref 4.22–5.81)
RDW: 16.3 % — ABNORMAL HIGH (ref 11.5–15.5)
WBC: 5.5 10*3/uL (ref 4.0–10.5)

## 2018-02-17 LAB — AMYLASE: Amylase: 75 U/L (ref 27–131)

## 2018-02-17 LAB — COMPREHENSIVE METABOLIC PANEL
ALT: 16 U/L (ref 0–53)
AST: 11 U/L (ref 0–37)
Albumin: 4.3 g/dL (ref 3.5–5.2)
Alkaline Phosphatase: 69 U/L (ref 39–117)
BUN: 14 mg/dL (ref 6–23)
CO2: 24 mEq/L (ref 19–32)
Calcium: 9.2 mg/dL (ref 8.4–10.5)
Chloride: 107 mEq/L (ref 96–112)
Creatinine, Ser: 1.06 mg/dL (ref 0.40–1.50)
GFR: 94.7 mL/min (ref >60.00)
Glucose, Bld: 85 mg/dL (ref 70–99)
Potassium: 3.8 mEq/L (ref 3.5–5.1)
Sodium: 138 mEq/L (ref 135–145)
Total Bilirubin: 0.5 mg/dL (ref 0.2–1.2)
Total Protein: 7.5 g/dL (ref 6.0–8.3)

## 2018-02-17 LAB — LIPASE: Lipase: 21 U/L (ref 11.0–59.0)

## 2018-02-17 NOTE — Patient Instructions (Signed)
Please go to the lab before you leave.  You will also be informed of the timing of your ultrasound.  If symptoms worsen, please seek medical attention.   Abdominal Pain, Adult Many things can cause belly (abdominal) pain. Most times, belly pain is not dangerous. Many cases of belly pain can be watched and treated at home. Sometimes belly pain is serious, though. Your doctor will try to find the cause of your belly pain. Follow these instructions at home:  Take over-the-counter and prescription medicines only as told by your doctor. Do not take medicines that help you poop (laxatives) unless told to by your doctor.  Drink enough fluid to keep your pee (urine) clear or pale yellow.  Watch your belly pain for any changes.  Keep all follow-up visits as told by your doctor. This is important. Contact a doctor if:  Your belly pain changes or gets worse.  You are not hungry, or you lose weight without trying.  You are having trouble pooping (constipated) or have watery poop (diarrhea) for more than 2-3 days.  You have pain when you pee or poop.  Your belly pain wakes you up at night.  Your pain gets worse with meals, after eating, or with certain foods.  You are throwing up and cannot keep anything down.  You have a fever. Get help right away if:  Your pain does not go away as soon as your doctor says it should.  You cannot stop throwing up.  Your pain is only in areas of your belly, such as the right side or the left lower part of the belly.  You have bloody or black poop, or poop that looks like tar.  You have very bad pain, cramping, or bloating in your belly.  You have signs of not having enough fluid or water in your body (dehydration), such as: ? Dark pee, very little pee, or no pee. ? Cracked lips. ? Dry mouth. ? Sunken eyes. ? Sleepiness. ? Weakness. This information is not intended to replace advice given to you by your health care provider. Make sure you  discuss any questions you have with your health care provider. Document Released: 10/07/2007 Document Revised: 11/08/2015 Document Reviewed: 10/02/2015 Elsevier Interactive Patient Education  2018 [employer].

## 2018-02-17 NOTE — Progress Notes (Signed)
Subjective:    Patient ID: [name], male    DOB: 03/30/1966, 52 y.o.   MRN: [id number]  HPI  Anthony [name] is a 51 year old male who presents today with right sided abdominal pain that has been present for 2 days  Abdominal Pain: Location: Upper right sided pain Onset/Timing: 2 days ago. He reports a history of same pain in same area that occurred approximately 6 years ago. He states that workup did not reveal source of pain. Duration: Over the past 2 days pain has improved and can be absent at times. Better than yesterday. Severity/Quality: 5 with pressure and described as dull Worse/Better by: It can improve spontaneously History of peptic, duodenal ulcer, and NAFLD. He was evaluated one month ago for diarrhea that resolved spontaneously. He denies any loose stools or constipation. Last BM this morning was soft. No change is urine or stool color. Denies hematochezia, melena, chest pain, palpitations, SOB No treatments have been tried at this time.  Pain is not associated with eating and no trigger has been identified  Fever/chills/sweats: No Loss of appetite: No Vomiting: No Diarrhea: No Rectal bleeding: No Fever: No Weight loss:No  Denies alcohol intake.  Review of Systems  Constitutional: Negative for chills, fatigue and fever.  Respiratory: Negative for cough, shortness of breath and wheezing.   Cardiovascular: Negative for chest pain and palpitations.  Gastrointestinal: Positive for abdominal pain. Negative for blood in stool, constipation, diarrhea, nausea and vomiting.  Genitourinary: Negative for dysuria, flank pain and hematuria.  Musculoskeletal: Negative for back pain.  Skin: Negative for rash.   Past Medical History:  Diagnosis Date  . Anemia   . Anxiety   . Asthma   . Cataract   . Depression   . DVT (deep venous thrombosis) ([place])   . Fatty liver   . H/O renal calculi 2006  . Hyperlipidemia   . Internal hemorrhoid   . Kidney stones   . PE  (pulmonary embolism)   . Peptic ulcer disease   . Pneumonia   . Spondylosis   . Tubular adenoma of colon 2011   Dr Olevia Perches     Social History   Socioeconomic History  . Marital status: Divorced    Spouse name: Not on file  . Number of children: 4  . Years of education: 86  . Highest education level: Not on file  Occupational History  . Not on file  Social Needs  . Financial resource strain: Not on file  . Food insecurity:    Worry: Not on file    Inability: Not on file  . Transportation needs:    Medical: Not on file    Non-medical: Not on file  Tobacco Use  . Smoking status: Never Smoker  . Smokeless tobacco: Never Used  Substance and Sexual Activity  . Alcohol use: Yes    Alcohol/week: 0.0 standard drinks    Comment: Socially , < 2X/ month  . Drug use: No  . Sexual activity: Yes    Comment: 2 boys and 2 girls, work at the post [employer]  Lifestyle  . Physical activity:    Days per week: Not on file    Minutes per session: Not on file  . Stress: Not on file  Relationships  . Social connections:    Talks on phone: Not on file    Gets together: Not on file    Attends religious service: Not on file    Active member of club or organization:  Not on file    Attends meetings of clubs or organizations: Not on file    Relationship status: Not on file  . Intimate partner violence:    Fear of current or ex partner: Not on file    Emotionally abused: Not on file    Physically abused: Not on file    Forced sexual activity: Not on file  Other Topics Concern  . Not on file  Social History Narrative   Fun: [employer]    Past Surgical History:  Procedure Laterality Date  . CATARACT EXTRACTION Left 1996   Dr Elonda Husky; [facility]  . colonoscopy with polypectomy  2011   Dr Olevia Perches, hemorrhoids  . CYSTOSCOPY W/ STONE MANIPULATION  2006  . INGUINAL HERNIA REPAIR Right 1990  . SHOULDER SURGERY Right 12/17/2010   Dr French Ana ; shoulder impingement & torn tendon    Family History  Problem  Relation Age of Onset  . Heart attack Mother 83  . Stroke Father 53  . Diabetes Father        PVD  . Glaucoma Father        blindness  . Cancer Paternal Grandmother        ? primary  . Heart attack Maternal Uncle         X2,pre 44  . Heart attack Maternal Grandmother 73  . Asthma Neg Hx   . COPD Neg Hx     Allergies  Allergen Reactions  . Aleve [Naproxen] Rash    Patient stated it was years ago    Current Outpatient Medications on File Prior to Visit  Medication Sig Dispense Refill  . albuterol (PROVENTIL HFA;VENTOLIN HFA) 108 (90 Base) MCG/ACT inhaler Inhale 2 puffs into the lungs every 6 (six) hours as needed. For shortness of breath 1 Inhaler 1  . ARIPiprazole (ABILIFY) 10 MG tablet Take 10 mg by mouth daily as needed (anxiety).     Marland Kitchen aspirin EC 81 MG tablet Take 81 mg by mouth daily.    . Brinzolamide-Brimonidine 1-0.2 % SUSP Place 1 drop into both eyes 2 (two) times daily.    . diclofenac sodium (VOLTAREN) 1 % GEL Apply 4 g topically 4 (four) times daily as needed. 400 g 2  . fluticasone (FLONASE) 50 MCG/ACT nasal spray Place 2 sprays into both nostrils daily as needed for allergies.    Marland Kitchen PARoxetine (PAXIL) 10 MG tablet Take 10 mg by mouth daily.    . SYMBICORT 160-4.5 MCG/ACT inhaler Inhale 2 puffs into the lungs daily as needed (shortness of breath).     . traMADol (ULTRAM) 50 MG tablet Take 1 tablet (50 mg total) by mouth every 8 (eight) hours as needed. 30 tablet 0  . valACYclovir (VALTREX) 500 MG tablet Take 1 tablet (500 mg total) by mouth 2 (two) times daily. 90 tablet 3   No current facility-administered medications on file prior to visit.     BP 116/78 (BP Location: Right Arm, Patient Position: Sitting, Cuff Size: Large)   Pulse 94   Temp 97.6 F (36.4 C) (Oral)   Ht 5\' 7"  (1.702 m)   Wt 202 lb (91.6 kg)   SpO2 96%   BMI 31.64 kg/m       Objective:   Physical Exam  Constitutional: He is oriented to person, place, and time. He appears well-developed  and well-nourished.  Eyes: Pupils are equal, round, and reactive to light. No scleral icterus.  Neck: Neck supple.  Cardiovascular: Normal rate, regular rhythm and normal heart  sounds.  Pulmonary/Chest: Effort normal and breath sounds normal. He has no wheezes. He has no rales.  Abdominal: Soft. Bowel sounds are normal. There is generalized tenderness and tenderness in the right upper quadrant. There is positive Murphy's sign. There is no rigidity, no rebound, no guarding, no CVA tenderness and no tenderness at McBurney's point.  Lymphadenopathy:    He has no cervical adenopathy.  Neurological: He is alert and oriented to person, place, and time.  Skin: Skin is warm and dry. No rash noted.  Psychiatric: He has a normal mood and affect. His behavior is normal. Judgment and thought content normal.        Assessment & Plan:  1. Right upper quadrant pain Pain during exam with mildly positive Murphy's sign. Symptoms are concerning for biliary tree or liver involvement. Will obtain lab work today and [place] of abdomen. Patient appears well and is afebrile with pain elicited during exam only. [place] schedule prior to patient leaving clinic. Close return precautions provided and advised seeking immediate medical attention if pain worsens. Further plan will be determined after review of lab work and imaging. He voiced understanding and agreed with plan.  - US Abdomen Complete; Future - CBC with Differential/Platelet; Future - Lipase; Future - CMP; Future - Amylase; Future  Delano Metz, FNP-C

## 2018-02-18 ENCOUNTER — Ambulatory Visit (HOSPITAL_COMMUNITY)
Admission: RE | Admit: 2018-02-18 | Discharge: 2018-02-18 | Disposition: A | Discharge status: Home / Self Care | Payer: Federal, State, Local not specified - PPO | Source: Ambulatory Visit | Attending: Family Medicine | Admitting: Family Medicine

## 2018-02-18 ENCOUNTER — Encounter (HOSPITAL_COMMUNITY): Payer: Self-pay

## 2018-02-18 DIAGNOSIS — R1011 Right upper quadrant pain: Secondary | ICD-10-CM | POA: Diagnosis not present

## 2018-02-18 NOTE — Addendum Note (Signed)
Addended by: Karren Cobble on: 02/18/2018 02:25 PM   Modules accepted: Orders

## 2018-02-20 ENCOUNTER — Encounter: Payer: Self-pay | Admitting: Internal Medicine

## 2018-02-22 ENCOUNTER — Other Ambulatory Visit: Payer: Self-pay | Admitting: Family Medicine

## 2018-02-22 DIAGNOSIS — R1011 Right upper quadrant pain: Secondary | ICD-10-CM

## 2018-02-22 NOTE — Progress Notes (Signed)
Referral to GI has been placed per patient request for further evaluation.

## 2018-02-24 ENCOUNTER — Ambulatory Visit: Payer: Federal, State, Local not specified - PPO | Admitting: Gastroenterology

## 2018-03-10 ENCOUNTER — Other Ambulatory Visit: Payer: Self-pay | Admitting: Internal Medicine

## 2018-03-10 ENCOUNTER — Encounter: Payer: Self-pay | Admitting: Internal Medicine

## 2018-03-10 DIAGNOSIS — B009 Herpesviral infection, unspecified: Secondary | ICD-10-CM

## 2018-03-11 ENCOUNTER — Ambulatory Visit (INDEPENDENT_AMBULATORY_CARE_PROVIDER_SITE_OTHER): Payer: Federal, State, Local not specified - PPO | Admitting: Gastroenterology

## 2018-03-11 ENCOUNTER — Encounter: Payer: Self-pay | Admitting: Gastroenterology

## 2018-03-11 VITALS — BP 96/64 | HR 104 | Ht 66.75 in | Wt 205.2 lb

## 2018-03-11 DIAGNOSIS — R0789 Other chest pain: Secondary | ICD-10-CM | POA: Diagnosis not present

## 2018-03-11 MED ORDER — VALACYCLOVIR HCL 1 G PO TABS: ORAL_TABLET | ORAL | 0 refills | Status: DC

## 2018-03-11 NOTE — Addendum Note (Signed)
Addended by: Biagio Borg on: 03/11/2018 01:42 PM   Modules accepted: Orders

## 2018-03-11 NOTE — Patient Instructions (Signed)
If you are age 51 or older, your body mass index should be between 23-30. Your Body mass index is 32.39 kg/m. If this is out of the aforementioned range listed, please consider follow up with your Primary Care Provider.  If you are age 3 or younger, your body mass index should be between 19-25. Your Body mass index is 32.39 kg/m. If this is out of the aformentioned range listed, please consider follow up with your Primary Care Provider.   It was a pleasure to see you today!  Dr. Loletha Carrow  Chest Wall Pain Chest wall pain is pain in or around the bones and muscles of your chest. Sometimes, an injury causes this pain. Sometimes, the cause may not be known. This pain may take several weeks or longer to get better. Follow these instructions at home: Pay attention to any changes in your symptoms. Take these actions to help with your pain:  Rest as told by your doctor.  Avoid activities that cause pain. Try not to use your chest, belly (abdominal), or side muscles to lift heavy things.  If directed, apply ice to the painful area: ? Put ice in a plastic bag. ? Place a towel between your skin and the bag. ? Leave the ice on for 20 minutes, 2-3 times per day.  Take over-the-counter and prescription medicines only as told by your doctor.  Do not use tobacco products, including cigarettes, chewing tobacco, and e-cigarettes. If you need help quitting, ask your doctor.  Keep all follow-up visits as told by your doctor. This is important.  Contact a doctor if:  You have a fever.  Your chest pain gets worse.  You have new symptoms. Get help right away if:  You feel sick to your stomach (nauseous) or you throw up (vomit).  You feel sweaty or light-headed.  You have a cough with phlegm (sputum) or you cough up blood.  You are short of breath. This information is not intended to replace advice given to you by your health care provider. Make sure you discuss any questions you have with your  health care provider. Document Released: 10/07/2007 Document Revised: 09/26/2015 Document Reviewed: 07/16/2014 Elsevier Interactive Patient Education  [employer].

## 2018-03-11 NOTE — Progress Notes (Signed)
[facility] Progress Note  Chief Complaint: Right upper quadrant pain  Subjective  History:  This is a 52 year old man last seen by me in February 2017, at which point he was a new patient to me.  He had previously been seen by Dr. Olevia Perches.  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  He had stopped using those medicines, he had no further symptoms, and no further testing was done by me. He has now had right upper quadrant pain radiating to the mid back.  Often the upper quadrant and back pain are separate from each other, sometimes brought on by certain movements or position or driving.  He has been having more problems with his shoulder lately after a previous rotator cuff repair.  He no longer takes aspirin or NSAIDs, which had previously caused his ulcer.  ROS: Cardiovascular:  no chest pain Respiratory: no dyspnea and no respirophasic because of chest pain.  The patient's Past Medical, Family and Social History were reviewed and are on file in the EMR.  Objective:  Med list reviewed  Current Outpatient Medications:  .  albuterol (PROVENTIL HFA;VENTOLIN HFA) 108 (90 Base) MCG/ACT inhaler, Inhale 2 puffs into the lungs every 6 (six) hours as needed. For shortness of breath, Disp: 1 Inhaler, Rfl: 1 .  ARIPiprazole (ABILIFY) 10 MG tablet, Take 10 mg by mouth daily as needed (anxiety). , Disp: , Rfl:  .  aspirin EC 81 MG tablet, Take 81 mg by mouth daily., Disp: , Rfl:  .  Brinzolamide-Brimonidine 1-0.2 % SUSP, Place 1 drop into both eyes 2 (two) times daily., Disp: , Rfl:  .  diclofenac sodium (VOLTAREN) 1 % GEL, Apply 4 g topically 4 (four) times daily as needed., Disp: 400 g, Rfl: 2 .  fluticasone (FLONASE) 50 MCG/ACT nasal spray, Place 2 sprays into both nostrils daily as needed for allergies., Disp: , Rfl:  .  PARoxetine (PAXIL) 10 MG tablet, Take 10 mg by mouth daily., Disp:  , Rfl:  .  SYMBICORT 160-4.5 MCG/ACT inhaler, Inhale 2 puffs into the lungs daily as needed (shortness of breath). , Disp: , Rfl:  .  traMADol (ULTRAM) 50 MG tablet, Take 1 tablet (50 mg total) by mouth every 8 (eight) hours as needed., Disp: 30 tablet, Rfl: 0 .  valACYclovir (VALTREX) 1000 MG tablet, TAKE 1/2 TABLET BY MOUTH TWICE DAILY FOR 3 DAYS OR 1 TABLET BY MOUTH EVERY DAY FOR 5 DAYS AT ONSET OF BREAKOUT, Disp: 30 tablet, Rfl: 0   Vital signs in last 24 hrs: Vitals:   03/11/18 1621  BP: 96/64  Pulse: (!) 104    Physical Exam  Is well-appearing  HEENT: sclera anicteric, oral mucosa moist without lesions  Neck: supple, no thyromegaly, JVD or lymphadenopathy  Cardiac: RRR without murmurs, S1S2 heard, no peripheral edema  Pulm: clear to auscultation bilaterally, normal RR and effort noted  Abdomen: soft, he has right lower anterior chest wall tenderness, with active bowel sounds. No guarding or palpable hepatosplenomegaly.  He also has right mid back paraspinous tenderness  Skin; warm and dry, no jaundice or rash  Recent Labs:  CMP Latest Ref Rng & Units 02/17/2018 01/24/2018 07/08/2017  Glucose 70 - 99 mg/dL 85 97 94  BUN 6 - 23 mg/dL 14 12 15   Creatinine 0.40 - 1.50 mg/dL 1.06 1.01 1.01  Sodium 135 - 145 mEq/L 138 139 139  Potassium  3.5 - 5.1 mEq/L 3.8 3.5 4.1  Chloride 96 - 112 mEq/L 107 104 105  CO2 19 - 32 mEq/L 24 27 26   Calcium 8.4 - 10.5 mg/dL 9.2 9.2 9.6  Total Protein 6.0 - 8.3 g/dL 7.5 - 7.0  Total Bilirubin 0.2 - 1.2 mg/dL 0.5 - 0.7  Alkaline Phos 39 - 117 U/L 69 - 66  AST 0 - 37 U/L 11 - 15  ALT 0 - 53 U/L 16 - 21     Radiologic studies: 02/18/2018 abdominal ultrasound showed no gallstones or gallbladder wall thickening, normal CBD diameter.  Patient had upper quadrant tenderness with palpation by sonographer.   @ASSESSMENTPLANBEGIN @ Assessment: Encounter Diagnosis  Name Primary?  . Chest wall pain Yes   This patient's pain is not digestive in  origin, appears to be costochondritis   Plan: He is referred back to primary care for management.  Total time 15 minutes, over half spent face-to-face with patient in counseling and coordination of care.   Nelida Meuse III

## 2018-03-16 ENCOUNTER — Ambulatory Visit: Payer: Federal, State, Local not specified - PPO | Admitting: Internal Medicine

## 2018-03-27 DIAGNOSIS — M25511 Pain in right shoulder: Secondary | ICD-10-CM | POA: Diagnosis not present

## 2018-03-28 ENCOUNTER — Ambulatory Visit: Payer: Federal, State, Local not specified - PPO | Admitting: Internal Medicine

## 2018-03-28 DIAGNOSIS — Z0289 Encounter for other administrative examinations: Secondary | ICD-10-CM

## 2018-03-29 DIAGNOSIS — K0252 Dental caries on pit and fissure surface penetrating into dentin: Secondary | ICD-10-CM | POA: Diagnosis not present

## 2018-03-29 DIAGNOSIS — K0262 Dental caries on smooth surface penetrating into dentin: Secondary | ICD-10-CM | POA: Diagnosis not present

## 2018-04-07 DIAGNOSIS — Z7189 Other specified counseling: Secondary | ICD-10-CM | POA: Diagnosis not present

## 2018-04-13 ENCOUNTER — Ambulatory Visit: Payer: Federal, State, Local not specified - PPO | Admitting: Internal Medicine

## 2018-04-13 DIAGNOSIS — Z136 Encounter for screening for cardiovascular disorders: Secondary | ICD-10-CM | POA: Diagnosis not present

## 2018-04-13 DIAGNOSIS — Z0289 Encounter for other administrative examinations: Secondary | ICD-10-CM

## 2018-04-14 DIAGNOSIS — Z7189 Other specified counseling: Secondary | ICD-10-CM | POA: Diagnosis not present

## 2018-04-21 ENCOUNTER — Encounter: Payer: Self-pay | Admitting: Internal Medicine

## 2018-04-27 DIAGNOSIS — R093 Abnormal sputum: Secondary | ICD-10-CM | POA: Diagnosis not present

## 2018-04-27 DIAGNOSIS — R309 Painful micturition, unspecified: Secondary | ICD-10-CM | POA: Diagnosis not present

## 2018-04-27 DIAGNOSIS — R05 Cough: Secondary | ICD-10-CM | POA: Diagnosis not present

## 2018-04-28 ENCOUNTER — Encounter: Payer: Self-pay | Admitting: Internal Medicine

## 2018-04-29 ENCOUNTER — Ambulatory Visit: Payer: Federal, State, Local not specified - PPO | Admitting: Internal Medicine

## 2018-04-29 ENCOUNTER — Encounter: Payer: Self-pay | Admitting: Internal Medicine

## 2018-04-29 VITALS — BP 110/80 | HR 102 | Temp 97.8°F | Ht 66.75 in | Wt 206.0 lb

## 2018-04-29 DIAGNOSIS — J4531 Mild persistent asthma with (acute) exacerbation: Secondary | ICD-10-CM

## 2018-04-29 DIAGNOSIS — R05 Cough: Secondary | ICD-10-CM

## 2018-04-29 DIAGNOSIS — I2699 Other pulmonary embolism without acute cor pulmonale: Secondary | ICD-10-CM

## 2018-04-29 DIAGNOSIS — R0789 Other chest pain: Secondary | ICD-10-CM

## 2018-04-29 DIAGNOSIS — G4733 Obstructive sleep apnea (adult) (pediatric): Secondary | ICD-10-CM | POA: Diagnosis not present

## 2018-04-29 DIAGNOSIS — R102 Pelvic and perineal pain: Secondary | ICD-10-CM

## 2018-04-29 MED ORDER — METHYLPREDNISOLONE ACETATE 40 MG/ML IJ SUSP
40.0000 mg | Freq: Once | INTRAMUSCULAR | Status: AC
  Administered 2018-04-29: 40 mg via INTRAMUSCULAR

## 2018-04-29 MED ORDER — FLUTICASONE PROPIONATE 50 MCG/ACT NA SUSP: 2.0000 | Freq: Every day | NASAL | 3 refills | Status: DC | PRN

## 2018-04-29 NOTE — Patient Instructions (Addendum)
We have sent in the flonase to help the sinuses with drainage. Use 2 sprays daily in each nostril.

## 2018-04-29 NOTE — Assessment & Plan Note (Signed)
Given appropriate coverage if STD. He had screening for STD 2 days ago and feel that re-testing is not appropriate at this time. Advised he can use tylenol for discomfort and push fluids.

## 2018-04-29 NOTE — Assessment & Plan Note (Signed)
Given depo-medrol 40 mg IM today to prevent flare. He does not need antibiotics at this time. Continue symbicort and albuterol prn. Rx for flonase and advised to start this daily for 1-2 weeks. Call back if worsening or no improvement.

## 2018-04-29 NOTE — Assessment & Plan Note (Signed)
Not similar to prior and no chest pains on breathing. Does not have risk factors today and no evaluation done for PE.

## 2018-04-29 NOTE — Progress Notes (Signed)
   Subjective:   Patient ID: Anthony [name], male    DOB: [birth year], 52 y.o.   MRN: [id number]  HPI The patient is a 52 y.o. man coming in for cold symptoms. Started about 1 week ago. Main symptoms are: cough, drainage, mild SOB. Denies fevers or chills or ear pain or sinus pressure. Overall it is stable to mild improvement. Has tried mucinex and got injection of ceftriaxone and dose of azithromycin at the [place]. History of PE 2016 but this does not feel similar. Was seen by [facility] on 04/27/18 for same as well as penile tingling and burning. They did STD screening at that time but it has not returned yet. Does have asthma and takes symbicort. Denies using albuterol more than usual.   Review of Systems  Constitutional: Negative for activity change, appetite change, chills, fatigue, fever and unexpected weight change.  HENT: Positive for congestion, postnasal drip and rhinorrhea. Negative for ear discharge, ear pain, sinus pressure, sinus pain, sneezing, sore throat, tinnitus, trouble swallowing and voice change.   Eyes: Negative.   Respiratory: Positive for cough and shortness of breath. Negative for chest tightness and wheezing.   Cardiovascular: Negative.   Gastrointestinal: Negative.   Musculoskeletal: Negative.   Neurological: Negative.     Objective:  Physical Exam Constitutional:      Appearance: He is well-developed.  HENT:     Head: Normocephalic and atraumatic.     Comments: Oropharynx with redness and clear drainage, nose with swollen turbinates, TMs normal bilaterally.  Neck:     Musculoskeletal: Normal range of motion.     Thyroid: No thyromegaly.  Cardiovascular:     Rate and Rhythm: Normal rate and regular rhythm.  Pulmonary:     Effort: Pulmonary effort is normal. No respiratory distress.     Breath sounds: Normal breath sounds. No stridor. No wheezing, rhonchi or rales.  Abdominal:     Palpations: Abdomen is soft.  Musculoskeletal:        General: Tenderness present.    Lymphadenopathy:     Cervical: No cervical adenopathy.  Skin:    General: Skin is warm and dry.  Neurological:     Mental Status: He is alert and oriented to person, place, and time.     Vitals:   04/29/18 1105  BP: 110/80  Pulse: (!) 102  Temp: 97.8 F (36.6 C)  TempSrc: Oral  SpO2: 97%  Weight: 206 lb (93.4 kg)  Height: 5' 6.75" (1.695 m)    Assessment & Plan:  Depo-medrol 40 mg IM given at visit

## 2018-04-29 NOTE — Assessment & Plan Note (Signed)
Stable from prior and not worse during this cold. Has seen GI and they think it is related to muscles in the rib area.

## 2018-04-30 ENCOUNTER — Encounter: Payer: Self-pay | Admitting: Internal Medicine

## 2018-05-02 ENCOUNTER — Encounter: Payer: Self-pay | Admitting: Internal Medicine

## 2018-05-02 DIAGNOSIS — N419 Inflammatory disease of prostate, unspecified: Secondary | ICD-10-CM

## 2018-05-02 MED ORDER — DOXYCYCLINE HYCLATE 100 MG PO TABS: 100.0000 mg | ORAL_TABLET | Freq: Two times a day (BID) | ORAL | 0 refills | Status: DC

## 2018-05-02 NOTE — Telephone Encounter (Signed)
Done erx 

## 2018-05-06 ENCOUNTER — Other Ambulatory Visit (INDEPENDENT_AMBULATORY_CARE_PROVIDER_SITE_OTHER): Payer: Federal, State, Local not specified - PPO

## 2018-05-06 ENCOUNTER — Ambulatory Visit: Payer: Federal, State, Local not specified - PPO | Admitting: Internal Medicine

## 2018-05-06 ENCOUNTER — Encounter: Payer: Self-pay | Admitting: Internal Medicine

## 2018-05-06 VITALS — BP 114/66 | HR 97 | Temp 97.8°F | Ht 66.75 in | Wt 206.0 lb

## 2018-05-06 DIAGNOSIS — R739 Hyperglycemia, unspecified: Secondary | ICD-10-CM

## 2018-05-06 DIAGNOSIS — Z0000 Encounter for general adult medical examination without abnormal findings: Secondary | ICD-10-CM | POA: Diagnosis not present

## 2018-05-06 DIAGNOSIS — A6000 Herpesviral infection of urogenital system, unspecified: Secondary | ICD-10-CM

## 2018-05-06 DIAGNOSIS — Z202 Contact with and (suspected) exposure to infections with a predominantly sexual mode of transmission: Secondary | ICD-10-CM

## 2018-05-06 DIAGNOSIS — R300 Dysuria: Secondary | ICD-10-CM | POA: Diagnosis not present

## 2018-05-06 HISTORY — DX: Herpesviral infection of urogenital system, unspecified: A60.00

## 2018-05-06 LAB — BASIC METABOLIC PANEL
BUN: 12 mg/dL (ref 6–23)
CO2: 24 mEq/L (ref 19–32)
Calcium: 9.3 mg/dL (ref 8.4–10.5)
Chloride: 106 mEq/L (ref 96–112)
Creatinine, Ser: 0.85 mg/dL (ref 0.40–1.50)
GFR: 122.07 mL/min (ref >60.00)
Glucose, Bld: 93 mg/dL (ref 70–99)
Potassium: 3.9 mEq/L (ref 3.5–5.1)
Sodium: 138 mEq/L (ref 135–145)

## 2018-05-06 LAB — URINALYSIS, ROUTINE W REFLEX MICROSCOPIC
Bilirubin Urine: NEGATIVE
Ketones, ur: NEGATIVE
Leukocytes, UA: NEGATIVE
Nitrite: NEGATIVE
Specific Gravity, Urine: 1.015 (ref 1.000–1.030)
Total Protein, Urine: NEGATIVE
Urine Glucose: NEGATIVE
Urobilinogen, UA: 0.2 (ref 0.0–1.0)
pH: 6.5 (ref 5.0–8.0)

## 2018-05-06 LAB — CBC WITH DIFFERENTIAL/PLATELET
Basophils Absolute: 0 10*3/uL (ref 0.0–0.1)
Basophils Relative: 1 % (ref 0.0–3.0)
Eosinophils Absolute: 0.3 10*3/uL (ref 0.0–0.7)
Eosinophils Relative: 5.2 % — ABNORMAL HIGH (ref 0.0–5.0)
HCT: 45.7 % (ref 39.0–52.0)
Hemoglobin: 15.4 g/dL (ref 13.0–17.0)
Lymphocytes Relative: 26.3 % (ref 12.0–46.0)
Lymphs Abs: 1.4 10*3/uL (ref 0.7–4.0)
MCHC: 33.8 g/dL (ref 30.0–36.0)
MCV: 84.2 fl (ref 78.0–100.0)
Monocytes Absolute: 0.5 10*3/uL (ref 0.1–1.0)
Monocytes Relative: 9.1 % (ref 3.0–12.0)
Neutro Abs: 3 10*3/uL (ref 1.4–7.7)
Neutrophils Relative %: 58.4 % (ref 43.0–77.0)
Platelets: 250 10*3/uL (ref 150.0–400.0)
RBC: 5.42 Mil/uL (ref 4.22–5.81)
RDW: 15.8 % — ABNORMAL HIGH (ref 11.5–15.5)
WBC: 5.2 10*3/uL (ref 4.0–10.5)

## 2018-05-06 LAB — LIPID PANEL
Cholesterol: 194 mg/dL (ref 0–200)
HDL: 54.9 mg/dL (ref >39.00)
LDL Cholesterol: 100 mg/dL — ABNORMAL HIGH (ref 0–99)
NonHDL: 139.15
Total CHOL/HDL Ratio: 4
Triglycerides: 194 mg/dL — ABNORMAL HIGH (ref 0.0–149.0)
VLDL: 38.8 mg/dL (ref 0.0–40.0)

## 2018-05-06 LAB — HEMOGLOBIN A1C: Hgb A1c MFr Bld: 5.9 % (ref 4.6–6.5)

## 2018-05-06 LAB — HEPATIC FUNCTION PANEL
ALT: 19 U/L (ref 0–53)
AST: 15 U/L (ref 0–37)
Albumin: 4.2 g/dL (ref 3.5–5.2)
Alkaline Phosphatase: 73 U/L (ref 39–117)
Bilirubin, Direct: 0.1 mg/dL (ref 0.0–0.3)
Total Bilirubin: 0.7 mg/dL (ref 0.2–1.2)
Total Protein: 7.3 g/dL (ref 6.0–8.3)

## 2018-05-06 LAB — TSH: TSH: 0.72 u[IU]/mL (ref 0.35–4.50)

## 2018-05-06 LAB — PSA: PSA: 0.73 ng/mL (ref 0.10–4.00)

## 2018-05-06 MED ORDER — MELOXICAM 7.5 MG PO TABS: ORAL_TABLET | ORAL | 1 refills | Status: DC

## 2018-05-06 NOTE — Patient Instructions (Signed)
Please take all new medication as prescribed - the anti-inflammatory for pain  Please continue all other medications as before, and refills have been done if requested.  Please have the pharmacy call with any other refills you may need.  Please continue your efforts at being more active, low cholesterol diet, and weight control.  You are otherwise up to date with prevention measures today.  Please keep your appointments with your specialists as you may have planned  Please go to the LAB in the Basement (turn left off the elevator) for the tests to be done today  You will be contacted by phone if any changes need to be made immediately.  Otherwise, you will receive a letter about your results with an explanation, but please check with MyChart first.  Please remember to sign up for MyChart if you have not done so, as this will be important to you in the future with finding out test results, communicating by private email, and scheduling acute appointments online when needed.  Please return in 6 months, or sooner if needed

## 2018-05-06 NOTE — Assessment & Plan Note (Signed)
To f/u urology, recent infection ruled out by std testing and UA per pt at [facility]

## 2018-05-06 NOTE — Progress Notes (Signed)
Subjective:    Patient ID: Anthony [name], male    DOB: [birth year], 53 y.o.   MRN: [id number]  HPI  Here for wellness and f/u;  Overall doing ok;  Pt denies Chest pain, worsening SOB, DOE, wheezing, orthopnea, PND, worsening LE edema, palpitations, dizziness or syncope.  Pt denies neurological change such as new headache, facial or extremity weakness.  Pt denies polydipsia, polyuria, or low sugar symptoms. Pt states overall good compliance with treatment and medications, good tolerability, and has been trying to follow appropriate diet.  Pt denies worsening depressive symptoms, suicidal ideation or panic. No fever, night sweats, wt loss, loss of appetite, or other constitutional symptoms.  Pt states good ability with ADL's, has low fall risk, home safety reviewed and adequate, no other significant changes in hearing or vision, and only occasionally active with exercise. Also, was tx dec 25 at [place] with rocephin and zpack for STD, now resolved resolved also after taking 2 pills of doxycycline, and he report Urine testing for GC /chalmydia were negative.  Denies urinary symptoms such as dysuria, frequency, urgency, flank pain, hematuria or n/v, fever, chills.   Past Medical History:  Diagnosis Date  . Anemia   . Anxiety   . Asthma   . Cataract   . Depression   . DVT (deep venous thrombosis) ([place])   . Fatty liver   . Genital herpes 05/06/2018  . H/O renal calculi 2006  . Hyperlipidemia   . Internal hemorrhoid   . Kidney stones   . PE (pulmonary embolism)   . Peptic ulcer disease   . Pneumonia   . Spondylosis   . Tubular adenoma of colon 2011   Dr Olevia Perches   Past Surgical History:  Procedure Laterality Date  . CATARACT EXTRACTION Left 1996   Dr Elonda Husky; [facility]  . colonoscopy with polypectomy  2011   Dr Olevia Perches, hemorrhoids  . CYSTOSCOPY W/ STONE MANIPULATION  2006  . INGUINAL HERNIA REPAIR Right 1990  . SHOULDER SURGERY Right 12/17/2010   Dr French Ana ; shoulder impingement & torn tendon    reports that he has never smoked. He has never used smokeless tobacco. He reports current alcohol use. He reports that he does not use drugs. family history includes Cancer in his paternal grandmother; Diabetes in his father; Glaucoma in his father; Heart attack in his maternal uncle; Heart attack (age of onset: 27) in his mother; Heart attack (age of onset: 83) in his maternal grandmother; Stroke (age of onset: 55) in his father. Allergies  Allergen Reactions  . Aleve [Naproxen] Rash    Patient stated it was years ago   Current Outpatient Medications on File Prior to Visit  Medication Sig Dispense Refill  . albuterol (PROVENTIL HFA;VENTOLIN HFA) 108 (90 Base) MCG/ACT inhaler Inhale 2 puffs into the lungs every 6 (six) hours as needed. For shortness of breath 1 Inhaler 1  . ARIPiprazole (ABILIFY) 10 MG tablet Take 10 mg by mouth daily as needed (anxiety).     Marland Kitchen aspirin EC 81 MG tablet Take 81 mg by mouth daily.    . Brinzolamide-Brimonidine 1-0.2 % SUSP Place 1 drop into both eyes 2 (two) times daily.    . diclofenac sodium (VOLTAREN) 1 % GEL Apply 4 g topically 4 (four) times daily as needed. 400 g 2  . doxycycline (VIBRA-TABS) 100 MG tablet Take 1 tablet (100 mg total) by mouth 2 (two) times daily. 42 tablet 0  . fluticasone (FLONASE) 50 MCG/ACT nasal spray Place 2  sprays into both nostrils daily as needed for allergies. 16 g 3  . PARoxetine (PAXIL) 10 MG tablet Take 10 mg by mouth daily.    . SYMBICORT 160-4.5 MCG/ACT inhaler Inhale 2 puffs into the lungs daily as needed (shortness of breath).     . traMADol (ULTRAM) 50 MG tablet Take 1 tablet (50 mg total) by mouth every 8 (eight) hours as needed. 30 tablet 0  . valACYclovir (VALTREX) 1000 MG tablet TAKE 1/2 TABLET BY MOUTH TWICE DAILY FOR 3 DAYS OR 1 TABLET BY MOUTH EVERY DAY FOR 5 DAYS AT ONSET OF BREAKOUT 30 tablet 0   No current facility-administered medications on file prior to visit.    Review of Systems Constitutional: Negative for  other unusual diaphoresis, sweats, appetite or weight changes HENT: Negative for other worsening hearing loss, ear pain, facial swelling, mouth sores or neck stiffness.   Eyes: Negative for other worsening pain, redness or other visual disturbance.  Respiratory: Negative for other stridor or swelling Cardiovascular: Negative for other palpitations or other chest pain  Gastrointestinal: Negative for worsening diarrhea or loose stools, blood in stool, distention or other pain Genitourinary: Negative for hematuria, flank pain or other change in urine volume.  Musculoskeletal: Negative for myalgias or other joint swelling.  Skin: Negative for other color change, or other wound or worsening drainage.  Neurological: Negative for other syncope or numbness. Hematological: Negative for other adenopathy or swelling Psychiatric/Behavioral: Negative for hallucinations, other worsening agitation, SI, self-injury, or new decreased concentration All other system neg per pt    Objective:   Physical Exam BP 114/66   Pulse 97   Temp 97.8 F (36.6 C) (Oral)   Ht 5' 6.75" (1.695 m)   Wt 206 lb (93.4 kg)   SpO2 95%   BMI 32.51 kg/m  VS noted,  Constitutional: Pt is oriented to person, place, and time. Appears well-developed and well-nourished, in no significant distress and comfortable Head: Normocephalic and atraumatic  Eyes: Conjunctivae and EOM are normal. Pupils are equal, round, and reactive to light Right Ear: External ear normal without discharge Left Ear: External ear normal without discharge Nose: Nose without discharge or deformity Mouth/Throat: Oropharynx is without other ulcerations and moist  Neck: Normal range of motion. Neck supple. No JVD present. No tracheal deviation present or significant neck LA or mass Cardiovascular: Normal rate, regular rhythm, normal heart sounds and intact distal pulses.   Pulmonary/Chest: WOB normal and breath sounds without rales or wheezing  Abdominal:  Soft. Bowel sounds are normal. NT. No HSM  Musculoskeletal: Normal range of motion. Exhibits no edema Lymphadenopathy: Has no other cervical adenopathy.  Neurological: Pt is alert and oriented to person, place, and time. Pt has normal reflexes. No cranial nerve deficit. Motor grossly intact, Gait intact Skin: Skin is warm and dry. No rash noted or new ulcerations Psychiatric:  Has normal mood and affect. Behavior is normal without agitation No other exam findings Lab Results  Component Value Date   WBC 5.2 05/06/2018   HGB 15.4 05/06/2018   HCT 45.7 05/06/2018   PLT 250.0 05/06/2018   GLUCOSE 93 05/06/2018   CHOL 194 05/06/2018   TRIG 194.0 (H) 05/06/2018   HDL 54.90 05/06/2018   LDLDIRECT 148.7 04/15/2012   LDLCALC 100 (H) 05/06/2018   ALT 19 05/06/2018   AST 15 05/06/2018   NA 138 05/06/2018   K 3.9 05/06/2018   CL 106 05/06/2018   CREATININE 0.85 05/06/2018   BUN 12 05/06/2018   CO2  24 05/06/2018   TSH 0.72 05/06/2018   PSA 0.73 05/06/2018   HGBA1C 5.9 05/06/2018       Assessment & Plan:

## 2018-05-07 NOTE — Assessment & Plan Note (Signed)

## 2018-05-07 NOTE — Assessment & Plan Note (Signed)
For labs ordered today

## 2018-05-07 NOTE — Assessment & Plan Note (Signed)
stable overall by history and exam, recent data reviewed with pt, and pt to continue medical treatment as before,  to f/u any worsening symptoms or concerns  

## 2018-05-09 LAB — HIV ANTIBODY (ROUTINE TESTING W REFLEX): HIV 1&2 Ab, 4th Generation: NONREACTIVE

## 2018-05-09 LAB — SYPHILIS: RPR W/REFLEX TO RPR TITER AND TREPONEMAL ANTIBODIES, TRADITIONAL SCREENING AND DIAGNOSIS ALGORITHM: RPR Ser Ql: NONREACTIVE

## 2018-06-01 DIAGNOSIS — F4310 Post-traumatic stress disorder, unspecified: Secondary | ICD-10-CM | POA: Diagnosis not present

## 2018-06-16 DIAGNOSIS — F4310 Post-traumatic stress disorder, unspecified: Secondary | ICD-10-CM | POA: Diagnosis not present

## 2018-06-30 ENCOUNTER — Encounter: Payer: Self-pay | Admitting: Internal Medicine

## 2018-07-11 DIAGNOSIS — F4310 Post-traumatic stress disorder, unspecified: Secondary | ICD-10-CM | POA: Diagnosis not present

## 2018-07-11 DIAGNOSIS — K0262 Dental caries on smooth surface penetrating into dentin: Secondary | ICD-10-CM | POA: Diagnosis not present

## 2018-07-19 ENCOUNTER — Encounter: Payer: Self-pay | Admitting: Internal Medicine

## 2018-07-25 ENCOUNTER — Encounter: Payer: Self-pay | Admitting: Internal Medicine

## 2018-08-15 NOTE — Telephone Encounter (Signed)
There is no way to be certain- just need to use common sense. Every body should be wearing masks in public places, and washing hands a lot. Avoid exposure to people who have been exposed to someone sick, and avoid exposure to people who don't feel well themselves because of fever, cough, body aches, etc.

## 2018-08-18 ENCOUNTER — Encounter: Payer: Self-pay | Admitting: Internal Medicine

## 2018-08-19 NOTE — Telephone Encounter (Signed)
Staff to call pt to offer virtual visit for wheezing

## 2018-09-06 ENCOUNTER — Encounter: Payer: Self-pay | Admitting: Internal Medicine

## 2018-09-07 NOTE — Telephone Encounter (Signed)
Staff to contact pt - ok for in person visit, or virtual if pt prefers this

## 2018-10-06 DIAGNOSIS — M791 Myalgia, unspecified site: Secondary | ICD-10-CM | POA: Diagnosis not present

## 2018-10-06 DIAGNOSIS — M545 Low back pain: Secondary | ICD-10-CM | POA: Diagnosis not present

## 2018-10-06 DIAGNOSIS — G894 Chronic pain syndrome: Secondary | ICD-10-CM | POA: Diagnosis not present

## 2018-10-07 DIAGNOSIS — H40023 Open angle with borderline findings, high risk, bilateral: Secondary | ICD-10-CM | POA: Diagnosis not present

## 2018-10-07 DIAGNOSIS — Z961 Presence of intraocular lens: Secondary | ICD-10-CM | POA: Diagnosis not present

## 2018-10-07 DIAGNOSIS — H524 Presbyopia: Secondary | ICD-10-CM | POA: Diagnosis not present

## 2018-10-07 DIAGNOSIS — H25011 Cortical age-related cataract, right eye: Secondary | ICD-10-CM | POA: Diagnosis not present

## 2018-10-07 DIAGNOSIS — H2511 Age-related nuclear cataract, right eye: Secondary | ICD-10-CM | POA: Diagnosis not present

## 2018-11-08 ENCOUNTER — Other Ambulatory Visit: Payer: Self-pay

## 2018-11-08 ENCOUNTER — Ambulatory Visit (INDEPENDENT_AMBULATORY_CARE_PROVIDER_SITE_OTHER): Payer: Federal, State, Local not specified - PPO | Admitting: Internal Medicine

## 2018-11-08 ENCOUNTER — Encounter: Payer: Self-pay | Admitting: Internal Medicine

## 2018-11-08 ENCOUNTER — Other Ambulatory Visit (INDEPENDENT_AMBULATORY_CARE_PROVIDER_SITE_OTHER): Payer: Federal, State, Local not specified - PPO

## 2018-11-08 VITALS — BP 114/78 | HR 79 | Temp 97.6°F | Ht 66.75 in | Wt 209.0 lb

## 2018-11-08 DIAGNOSIS — E611 Iron deficiency: Secondary | ICD-10-CM

## 2018-11-08 DIAGNOSIS — E538 Deficiency of other specified B group vitamins: Secondary | ICD-10-CM

## 2018-11-08 DIAGNOSIS — R739 Hyperglycemia, unspecified: Secondary | ICD-10-CM

## 2018-11-08 DIAGNOSIS — J4531 Mild persistent asthma with (acute) exacerbation: Secondary | ICD-10-CM

## 2018-11-08 DIAGNOSIS — E785 Hyperlipidemia, unspecified: Secondary | ICD-10-CM | POA: Diagnosis not present

## 2018-11-08 DIAGNOSIS — E559 Vitamin D deficiency, unspecified: Secondary | ICD-10-CM

## 2018-11-08 LAB — BASIC METABOLIC PANEL
BUN: 12 mg/dL (ref 6–23)
CO2: 24 mEq/L (ref 19–32)
Calcium: 9.3 mg/dL (ref 8.4–10.5)
Chloride: 103 mEq/L (ref 96–112)
Creatinine, Ser: 0.95 mg/dL (ref 0.40–1.50)
GFR: 100.82 mL/min (ref >60.00)
Glucose, Bld: 92 mg/dL (ref 70–99)
Potassium: 4.1 mEq/L (ref 3.5–5.1)
Sodium: 136 mEq/L (ref 135–145)

## 2018-11-08 LAB — LIPID PANEL
Cholesterol: 211 mg/dL — ABNORMAL HIGH (ref 0–200)
HDL: 59.9 mg/dL (ref >39.00)
LDL Cholesterol: 119 mg/dL — ABNORMAL HIGH (ref 0–99)
NonHDL: 150.81
Total CHOL/HDL Ratio: 4
Triglycerides: 159 mg/dL — ABNORMAL HIGH (ref 0.0–149.0)
VLDL: 31.8 mg/dL (ref 0.0–40.0)

## 2018-11-08 LAB — IBC PANEL
Iron: 110 ug/dL (ref 42–165)
Saturation Ratios: 30.1 % (ref 20.0–50.0)
Transferrin: 261 mg/dL (ref 212.0–360.0)

## 2018-11-08 LAB — HEMOGLOBIN A1C: Hgb A1c MFr Bld: 6 % (ref 4.6–6.5)

## 2018-11-08 LAB — VITAMIN D 25 HYDROXY (VIT D DEFICIENCY, FRACTURES): VITD: 33.76 ng/mL (ref 30.00–100.00)

## 2018-11-08 LAB — VITAMIN B12: Vitamin B-12: 471 pg/mL (ref 211–911)

## 2018-11-08 NOTE — Assessment & Plan Note (Signed)
stable overall by history and exam, recent data reviewed with pt, and pt to continue medical treatment as before,  to f/u any worsening symptoms or concerns  

## 2018-11-08 NOTE — Patient Instructions (Signed)

## 2018-11-08 NOTE — Progress Notes (Signed)
Subjective:    Patient ID: Anthony [name], male    DOB: 03-Jan-1966, 53 y.o.   MRN: [id number]  HPI  Here to f/u; overall doing ok,  Pt denies chest pain, increasing sob or doe, wheezing, orthopnea, PND, increased LE swelling, palpitations, dizziness or syncope, except did have mild wheezing for a few days after a holiday cookout with smoke exposure.  Plans to get an incentive spirometer from the [place] to further monitor..  Pt denies new neurological symptoms such as new headache, or facial or extremity weakness or numbness.  Pt denies polydipsia, polyuria, or low sugar episode.  Pt states overall good compliance with meds, mostly trying to follow appropriate diet, with wt overall stable,  but little exercise however due to pandemic,  Plans to do better soon. BP Readings from Last 3 Encounters:  11/08/18 114/78  05/06/18 114/66  04/29/18 110/80  has gained several lbs with the pandemic and has not been able to get to the gym. Wt Readings from Last 3 Encounters:  11/08/18 209 lb (94.8 kg)  05/06/18 206 lb (93.4 kg)  04/29/18 206 lb (93.4 kg)  Did have a nightmare recently and fell OOB while asleep to his right hip area with persistent soreness x 1 wk, but walking.   Past Medical History:  Diagnosis Date  . Anemia   . Anxiety   . Asthma   . Cataract   . Depression   . DVT (deep venous thrombosis) ([place])   . Fatty liver   . Genital herpes 05/06/2018  . H/O renal calculi 2006  . Hyperlipidemia   . Internal hemorrhoid   . Kidney stones   . PE (pulmonary embolism)   . Peptic ulcer disease   . Pneumonia   . Spondylosis   . Tubular adenoma of colon 2011   Dr Olevia Perches   Past Surgical History:  Procedure Laterality Date  . CATARACT EXTRACTION Left 1996   Dr Elonda Husky; [facility]  . colonoscopy with polypectomy  2011   Dr Olevia Perches, hemorrhoids  . CYSTOSCOPY W/ STONE MANIPULATION  2006  . INGUINAL HERNIA REPAIR Right 1990  . SHOULDER SURGERY Right 12/17/2010   Dr French Ana ; shoulder impingement & torn  tendon    reports that he has never smoked. He has never used smokeless tobacco. He reports current alcohol use. He reports that he does not use drugs. family history includes Cancer in his paternal grandmother; Diabetes in his father; Glaucoma in his father; Heart attack in his maternal uncle; Heart attack (age of onset: 71) in his mother; Heart attack (age of onset: 41) in his maternal grandmother; Stroke (age of onset: 57) in his father. Allergies  Allergen Reactions  . Aleve [Naproxen] Rash    Patient stated it was years ago   Current Outpatient Medications on File Prior to Visit  Medication Sig Dispense Refill  . albuterol (PROVENTIL HFA;VENTOLIN HFA) 108 (90 Base) MCG/ACT inhaler Inhale 2 puffs into the lungs every 6 (six) hours as needed. For shortness of breath 1 Inhaler 1  . ARIPiprazole (ABILIFY) 10 MG tablet Take 10 mg by mouth daily as needed (anxiety).     Marland Kitchen aspirin EC 81 MG tablet Take 81 mg by mouth daily.    . Brinzolamide-Brimonidine 1-0.2 % SUSP Place 1 drop into both eyes 2 (two) times daily.    . diclofenac sodium (VOLTAREN) 1 % GEL Apply 4 g topically 4 (four) times daily as needed. 400 g 2  . doxycycline (VIBRA-TABS) 100 MG tablet Take  1 tablet (100 mg total) by mouth 2 (two) times daily. 42 tablet 0  . fluticasone (FLONASE) 50 MCG/ACT nasal spray Place 2 sprays into both nostrils daily as needed for allergies. 16 g 3  . meloxicam (MOBIC) 7.5 MG tablet 1 tab by mouth daily as needed for pain 90 tablet 1  . PARoxetine (PAXIL) 10 MG tablet Take 10 mg by mouth daily.    . SYMBICORT 160-4.5 MCG/ACT inhaler Inhale 2 puffs into the lungs daily as needed (shortness of breath).     . traMADol (ULTRAM) 50 MG tablet Take 1 tablet (50 mg total) by mouth every 8 (eight) hours as needed. 30 tablet 0  . valACYclovir (VALTREX) 1000 MG tablet TAKE 1/2 TABLET BY MOUTH TWICE DAILY FOR 3 DAYS OR 1 TABLET BY MOUTH EVERY DAY FOR 5 DAYS AT ONSET OF BREAKOUT 30 tablet 0   No current  facility-administered medications on file prior to visit.    Review of Systems  Constitutional: Negative for other unusual diaphoresis or sweats HENT: Negative for ear discharge or swelling Eyes: Negative for other worsening visual disturbances Respiratory: Negative for stridor or other swelling  Gastrointestinal: Negative for worsening distension or other blood Genitourinary: Negative for retention or other urinary change Musculoskeletal: Negative for other MSK pain or swelling Skin: Negative for color change or other new lesions Neurological: Negative for worsening tremors and other numbness  Psychiatric/Behavioral: Negative for worsening agitation or other fatigue All other system neg per pt    Objective:   Physical Exam BP 114/78   Pulse 79   Temp 97.6 F (36.4 C) (Oral)   Ht 5' 6.75" (1.695 m)   Wt 209 lb (94.8 kg)   SpO2 97%   BMI 32.98 kg/m  VS noted,  Constitutional: Pt appears in NAD HENT: Head: NCAT.  Right Ear: External ear normal.  Left Ear: External ear normal.  Eyes: . Pupils are equal, round, and reactive to light. Conjunctivae and EOM are normal Nose: without d/c or deformity Neck: Neck supple. Gross normal ROM Cardiovascular: Normal rate and regular rhythm.   Pulmonary/Chest: Effort normal and breath sounds without rales or wheezing.  Neurological: Pt is alert. At baseline orientation, motor grossly intact Skin: Skin is warm. No rashes, other new lesions, no LE edema Psychiatric: Pt behavior is normal without agitation  Mild tender over right greater trochanter without swelling or bruising Lab Results  Component Value Date   WBC 5.2 05/06/2018   HGB 15.4 05/06/2018   HCT 45.7 05/06/2018   PLT 250.0 05/06/2018   GLUCOSE 93 05/06/2018   CHOL 194 05/06/2018   TRIG 194.0 (H) 05/06/2018   HDL 54.90 05/06/2018   LDLDIRECT 148.7 04/15/2012   LDLCALC 100 (H) 05/06/2018   ALT 19 05/06/2018   AST 15 05/06/2018   NA 138 05/06/2018   K 3.9 05/06/2018   CL  106 05/06/2018   CREATININE 0.85 05/06/2018   BUN 12 05/06/2018   CO2 24 05/06/2018   TSH 0.72 05/06/2018   PSA 0.73 05/06/2018   HGBA1C 5.9 05/06/2018          Assessment & Plan:

## 2018-11-08 NOTE — Assessment & Plan Note (Signed)
stable overall by history and exam, recent data reviewed with pt, and pt to continue medical treatment as before,  to f/u any worsening symptoms or concerns, for f/u lab today 

## 2018-11-17 DIAGNOSIS — F4310 Post-traumatic stress disorder, unspecified: Secondary | ICD-10-CM | POA: Diagnosis not present

## 2018-11-30 DIAGNOSIS — M545 Low back pain: Secondary | ICD-10-CM | POA: Diagnosis not present

## 2018-11-30 DIAGNOSIS — M7918 Myalgia, other site: Secondary | ICD-10-CM | POA: Diagnosis not present

## 2018-11-30 DIAGNOSIS — M5106 Intervertebral disc disorders with myelopathy, lumbar region: Secondary | ICD-10-CM | POA: Diagnosis not present

## 2018-11-30 DIAGNOSIS — G894 Chronic pain syndrome: Secondary | ICD-10-CM | POA: Diagnosis not present

## 2018-12-02 ENCOUNTER — Other Ambulatory Visit: Payer: Self-pay | Admitting: Orthopaedic Surgery

## 2018-12-02 DIAGNOSIS — M545 Low back pain, unspecified: Secondary | ICD-10-CM

## 2018-12-23 ENCOUNTER — Ambulatory Visit (HOSPITAL_COMMUNITY)
Admission: EM | Admit: 2018-12-23 | Discharge: 2018-12-23 | Disposition: A | Discharge status: Home / Self Care | Payer: Federal, State, Local not specified - PPO | Attending: Family Medicine | Admitting: Family Medicine

## 2018-12-23 ENCOUNTER — Ambulatory Visit: Payer: Federal, State, Local not specified - PPO | Admitting: Internal Medicine

## 2018-12-23 ENCOUNTER — Encounter (HOSPITAL_COMMUNITY): Payer: Self-pay

## 2018-12-23 ENCOUNTER — Encounter: Payer: Self-pay | Admitting: Internal Medicine

## 2018-12-23 ENCOUNTER — Other Ambulatory Visit: Payer: Self-pay

## 2018-12-23 DIAGNOSIS — T63421A Toxic effect of venom of ants, accidental (unintentional), initial encounter: Secondary | ICD-10-CM

## 2018-12-23 DIAGNOSIS — T7840XA Allergy, unspecified, initial encounter: Secondary | ICD-10-CM

## 2018-12-23 MED ORDER — PREDNISONE 20 MG PO TABS: 20.0000 mg | ORAL_TABLET | Freq: Two times a day (BID) | ORAL | 0 refills | Status: DC

## 2018-12-23 NOTE — Discharge Instructions (Signed)
Take the prednisone as directed.  Take 2 doses today Take an antihistamine for the allergic reaction.  This could be Benadryl or a new antihistamine like Zyrtec or Claritin Use ice to area Wear insect repellent to prevent bites

## 2018-12-23 NOTE — ED Provider Notes (Signed)
[place]    CSN: [id number] Arrival date & time: 12/23/18  1706      History   Chief Complaint Chief Complaint  Patient presents with  . Insect Bite    HPI Anthony [name] is a 53 y.o. male.   HPI  Patient was working on his yard.  Multiple bites on left ankle.  Left ankle is swollen and painful.  He is here for evaluation.  He has no known allergy to insect bites in the past  Past Medical History:  Diagnosis Date  . Anemia   . Anxiety   . Asthma   . Cataract   . Depression   . DVT (deep venous thrombosis) ([place])   . Fatty liver   . Genital herpes 05/06/2018  . H/O renal calculi 2006  . Hyperlipidemia   . Internal hemorrhoid   . Kidney stones   . PE (pulmonary embolism)   . Peptic ulcer disease   . Pneumonia   . Spondylosis   . Tubular adenoma of colon 2011   Dr Olevia Perches    Patient Active Problem List   Diagnosis Date Noted  . Genital herpes 05/06/2018  . STD exposure 05/06/2018  . Obstructive sleep apnea 02/13/2018  . Diarrhea 01/24/2018  . Hyperglycemia 01/24/2018  . Pelvic pain 07/10/2017  . Exposure to blood or body fluid 07/07/2017  . Trigger finger, right middle finger 03/18/2017  . Anxiety 03/18/2017  . Chronic tension-type headache, not intractable 02/12/2017  . Herpes simplex type II infection 12/02/2016  . Dysuria 12/02/2016  . Pain of right thumb 11/13/2016  . Other chest pain 07/27/2016  . Onychomycosis 06/12/2016  . Preventative health care 06/12/2016  . Generalized headache 05/15/2016  . Lumbar paraspinal muscle spasm 05/15/2016  . Cervical paraspinal muscle spasm 05/15/2016  . History of DVT (deep vein thrombosis) 11/18/2015  . Elevated PSA 11/18/2015  . Acute pulmonary embolism ([place]) 2016 05/09/2015  . Peptic ulcer 05/09/2015  . Dyspnea 05/02/2015  . DVT (deep venous thrombosis) ([place]) 04/26/2015  . Duodenal ulcer 04/25/2015  . Positive D dimer 04/23/2015  . MRSA infection 09/14/2013  . Spondylosis of lumbar joint  09/28/2012  . Fatty liver disease, nonalcoholic 0000000  . Seasonal and perennial allergic rhinitis 10/01/2011  . Family history of ischemic heart disease 04/02/2011  . 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 COLONIC POLYPS, HX OF 06/21/2009  . HLD (hyperlipidemia) 02/16/2008  . Asthma, mild persistent 02/16/2008  . NONSPECIFIC ABNORMAL ELECTROCARDIOGRAM 02/16/2008  . NEPHROLITHIASIS, HX OF 02/16/2008    Past Surgical History:  Procedure Laterality Date  . CATARACT EXTRACTION Left 1996   Dr Elonda Husky; [facility]  . colonoscopy with polypectomy  2011   Dr Olevia Perches, hemorrhoids  . CYSTOSCOPY W/ STONE MANIPULATION  2006  . INGUINAL HERNIA REPAIR Right 1990  . SHOULDER SURGERY Right 12/17/2010   Dr French Ana ; shoulder impingement & torn tendon       Home Medications    Prior to Admission medications   Medication Sig Start Date End Date Taking? Authorizing Provider  albuterol (PROVENTIL HFA;VENTOLIN HFA) 108 (90 Base) MCG/ACT inhaler Inhale 2 puffs into the lungs every 6 (six) hours as needed. For shortness of breath 06/29/16   Martinique, Betty G, MD  ARIPiprazole (ABILIFY) 10 MG tablet Take 10 mg by mouth daily as needed (anxiety).     [provider]  aspirin EC 81 MG tablet Take 81 mg by mouth daily.    [provider]  Brinzolamide-Brimonidine 1-0.2 % SUSP  Place 1 drop into both eyes 2 (two) times daily.    [provider]  diclofenac sodium (VOLTAREN) 1 % GEL Apply 4 g topically 4 (four) times daily as needed. 11/13/16   Biagio Borg, MD  fluticasone (FLONASE) 50 MCG/ACT nasal spray Place 2 sprays into both nostrils daily as needed for allergies. 04/29/18   Hoyt Koch, MD  meloxicam [facility]) 7.5 MG tablet 1 tab by mouth daily as needed for pain 05/06/18   Biagio Borg, MD  PARoxetine (PAXIL) 10 MG tablet Take 10 mg by mouth daily.    [provider]  predniSONE (DELTASONE) 20 MG tablet Take 1 tablet (20 mg total) by mouth 2 (two) times daily with  a meal. 12/23/18   Raylene Everts, MD  SYMBICORT 160-4.5 MCG/ACT inhaler Inhale 2 puffs into the lungs daily as needed (shortness of breath).  10/26/14   [provider]  valACYclovir (VALTREX) 1000 MG tablet TAKE 1/2 TABLET BY MOUTH TWICE DAILY FOR 3 DAYS OR 1 TABLET BY MOUTH EVERY DAY FOR 5 DAYS AT ONSET OF BREAKOUT 03/11/18   Biagio Borg, MD    Family History Family History  Problem Relation Age of Onset  . Heart attack Mother 66  . Stroke Father 69  . Diabetes Father        PVD  . Glaucoma Father        blindness  . Cancer Paternal Grandmother        ? primary  . Heart attack Maternal Uncle         X2,pre 8  . Heart attack Maternal Grandmother 73  . Asthma Neg Hx   . COPD Neg Hx     Social History Social History   Tobacco Use  . Smoking status: Never Smoker  . Smokeless tobacco: Never Used  Substance Use Topics  . Alcohol use: Yes    Alcohol/week: 0.0 standard drinks    Comment: Socially , < 2X/ month  . Drug use: No     Allergies   Aleve [naproxen] and Nsaids   Review of Systems Review of Systems  Constitutional: Negative for chills and fever.  HENT: Negative for ear pain and sore throat.   Eyes: Negative for pain and visual disturbance.  Respiratory: Negative for cough and shortness of breath.   Cardiovascular: Negative for chest pain and palpitations.  Gastrointestinal: Negative for abdominal pain and vomiting.  Genitourinary: Negative for dysuria and hematuria.  Musculoskeletal: Negative for arthralgias and back pain.  Skin: Positive for rash. Negative for color change.  Neurological: Negative for seizures and syncope.  All other systems reviewed and are negative.    Physical Exam Triage Vital Signs ED Triage Vitals  Enc Vitals Group     BP 12/23/18 1722 122/86     Pulse Rate 12/23/18 1722 79     Resp 12/23/18 1722 14     Temp 12/23/18 1722 98.3 F (36.8 C)     Temp Source 12/23/18 1722 Oral     SpO2 12/23/18 1722 99 %      Weight --      Height --      Head Circumference --      Peak Flow --      Pain Score 12/23/18 1719 2     Pain Loc --      Pain Edu? --      Excl. in [place]? --    No data found.  Updated Vital Signs BP 122/86  Pulse 79   Temp 98.3 F (36.8 C) (Oral)   Resp 14   SpO2 99%      Physical Exam Constitutional:      General: He is not in acute distress.    Appearance: He is well-developed.  HENT:     Head: Normocephalic and atraumatic.  Eyes:     Conjunctiva/sclera: Conjunctivae normal.     Pupils: Pupils are equal, round, and reactive to light.  Neck:     Musculoskeletal: Normal range of motion.  Cardiovascular:     Rate and Rhythm: Normal rate.  Pulmonary:     Effort: Pulmonary effort is normal. No respiratory distress.  Abdominal:     General: There is no distension.     Palpations: Abdomen is soft.  Musculoskeletal: Normal range of motion.  Skin:    General: Skin is warm and dry.     Comments: Left ankle in his ankle is more swollen than the right.  Slightly warm.  There are 8 bites surrounding the ankle although there are small pustules consistent with fire ants  Neurological:     Mental Status: He is alert.      UC Treatments / Results  Labs (all labs ordered are listed, but only abnormal results are displayed) Labs Reviewed - No data to display  EKG   Radiology No results found.  Procedures Procedures (including critical care time)  Medications Ordered in UC Medications - No data to display  Initial Impression / Assessment and Plan / UC Course  I have reviewed the triage vital signs and the nursing notes.  Pertinent labs & imaging results that were available during my care of the patient were reviewed by me and considered in my medical decision making (see chart for details).     Mild allergic reaction to multiple fire ant bites.  Discussed Final Clinical Impressions(s) / UC Diagnoses   Final diagnoses:  Fire ant bite, accidental or  unintentional, initial encounter  Allergic reaction, initial encounter     Discharge Instructions     Take the prednisone as directed.  Take 2 doses today Take an antihistamine for the allergic reaction.  This could be Benadryl or a new antihistamine like Zyrtec or Claritin Use ice to area Wear insect repellent to prevent bites   ED Prescriptions    Medication Sig Dispense Auth. Provider   predniSONE (DELTASONE) 20 MG tablet Take 1 tablet (20 mg total) by mouth 2 (two) times daily with a meal. 10 tablet Raylene Everts, MD     Controlled Substance Prescriptions [place] Controlled Substance Registry consulted? Not Applicable   Raylene Everts, MD 12/23/18 Karl Bales

## 2018-12-23 NOTE — ED Triage Notes (Signed)
Patient presents to Urgent Care with complaints of insect bite to the outer aspect of his left ankle since yesterday. Patient reports the area is sore and itchy.

## 2018-12-26 DIAGNOSIS — Z0289 Encounter for other administrative examinations: Secondary | ICD-10-CM

## 2019-01-02 ENCOUNTER — Other Ambulatory Visit: Payer: Self-pay

## 2019-01-02 ENCOUNTER — Ambulatory Visit
Admission: RE | Admit: 2019-01-02 | Discharge: 2019-01-02 | Disposition: A | Discharge status: Home / Self Care | Payer: Federal, State, Local not specified - PPO | Source: Ambulatory Visit | Attending: Orthopaedic Surgery | Admitting: Orthopaedic Surgery

## 2019-01-02 DIAGNOSIS — M47816 Spondylosis without myelopathy or radiculopathy, lumbar region: Secondary | ICD-10-CM | POA: Diagnosis not present

## 2019-01-02 DIAGNOSIS — M545 Low back pain, unspecified: Secondary | ICD-10-CM

## 2019-01-02 DIAGNOSIS — M5127 Other intervertebral disc displacement, lumbosacral region: Secondary | ICD-10-CM | POA: Diagnosis not present

## 2019-01-05 DIAGNOSIS — F4310 Post-traumatic stress disorder, unspecified: Secondary | ICD-10-CM | POA: Diagnosis not present

## 2019-01-06 ENCOUNTER — Encounter: Payer: Self-pay | Admitting: Internal Medicine

## 2019-01-06 NOTE — Telephone Encounter (Signed)
Staff to see if pt can be seen at [facility] clinic please

## 2019-01-06 NOTE — Telephone Encounter (Signed)
Left pt vm to call back to schedule. 

## 2019-01-07 ENCOUNTER — Ambulatory Visit (INDEPENDENT_AMBULATORY_CARE_PROVIDER_SITE_OTHER): Payer: Federal, State, Local not specified - PPO | Admitting: Family Medicine

## 2019-01-07 ENCOUNTER — Encounter: Payer: Self-pay | Admitting: Family Medicine

## 2019-01-07 VITALS — Wt 190.2 lb

## 2019-01-07 DIAGNOSIS — G43119 Migraine with aura, intractable, without status migrainosus: Secondary | ICD-10-CM | POA: Diagnosis not present

## 2019-01-07 MED ORDER — SUMATRIPTAN SUCCINATE 100 MG PO TABS: ORAL_TABLET | ORAL | 0 refills | Status: DC

## 2019-01-07 NOTE — Assessment & Plan Note (Signed)
Symptoms very consistent with migraine.  Discussed migraine diary, healthy lifestyle.. increase water, sleep, eat regular meals etc. Will treat acute headache with  Sumatriptan.. reviewed use. He will go to ER if severe headache given limited exam on virtual visit today.  NSAIDs contraindicated.   Follow up with PCP or neuro if migraines continuing to occur > 2 times a month for likely preventative medication.

## 2019-01-07 NOTE — Progress Notes (Signed)
VIRTUAL VISIT Due to national recommendations of social distancing due to [place] 19, a virtual visit is felt to be most appropriate for this patient at this time.   I connected with the patient on 01/07/19 at  9:00 AM EDT by virtual telehealth platform and verified that I am speaking with the correct person using two identifiers.   I discussed the limitations, risks, security and privacy concerns of performing an evaluation and management service by  virtual telehealth platform and the availability of in person appointments. I also discussed with the patient that there may be a patient responsible charge related to this service. The patient expressed understanding and agreed to proceed.  Patient location: Home Provider Location: [facility] Participants: Eliezer Lofts and Hubbard Robinson   Chief Complaint  Patient presents with  . Headache    sometimes blurred vision-has hx of migraines. x 2 years.    History of Present Illness: 53 year old male presents with headaches  He reports sudden onset " migraine" on side of head, 9/10 on pain scale, now 4-5/10, throbbing, sensitive to light, occ nausea.  Improved some over night. Lately migraines occurring 2-3 a week in last several months.  No known triggers except inadequate sleep.  Before HA has blurred vision. No new numbness, no new weakness, no slurred speech, no confusion.   Previously BP well controlled. None measured today. BP Readings from Last 3 Encounters:  12/23/18 122/86  11/08/18 114/78  05/06/18 114/66   HX of beeling ulcer.. cannot take NSAIDs Hx of migraines per pt, dx x 2 years ago by NEURO but not on problem list or in chart for review... instead "generalized headaches " listed.  COVID 19 screen No recent travel or known exposure to COVID19 The patient denies respiratory symptoms of COVID 19 at this time.  The importance of social distancing was discussed today.   Review of Systems  Constitutional: Negative  for chills and fever.  HENT: Negative for congestion and ear pain.   Eyes: Positive for blurred vision. Negative for pain and redness.  Respiratory: Negative for cough and shortness of breath.   Cardiovascular: Negative for chest pain, palpitations and leg swelling.  Gastrointestinal: Positive for nausea. Negative for abdominal pain, blood in stool, constipation, diarrhea and vomiting.  Genitourinary: Negative for dysuria.  Musculoskeletal: Negative for falls and myalgias.  Skin: Negative for rash.  Neurological: Negative for dizziness.  Psychiatric/Behavioral: Negative for depression. The patient is not nervous/anxious.       Past Medical History:  Diagnosis Date  . Anemia   . Anxiety   . Asthma   . Cataract   . Depression   . DVT (deep venous thrombosis) ([place])   . Fatty liver   . Genital herpes 05/06/2018  . H/O renal calculi 2006  . Hyperlipidemia   . Internal hemorrhoid   . Kidney stones   . PE (pulmonary embolism)   . Peptic ulcer disease   . Pneumonia   . Spondylosis   . Tubular adenoma of colon 2011   Dr Olevia Perches    reports that he has never smoked. He has never used smokeless tobacco. He reports current alcohol use. He reports that he does not use drugs.   Current Outpatient Medications:  .  albuterol (PROVENTIL HFA;VENTOLIN HFA) 108 (90 Base) MCG/ACT inhaler, Inhale 2 puffs into the lungs every 6 (six) hours as needed. For shortness of breath, Disp: 1 Inhaler, Rfl: 1 .  ARIPiprazole (ABILIFY) 10 MG tablet, Take 10 mg  by mouth daily as needed (anxiety). , Disp: , Rfl:  .  aspirin EC 81 MG tablet, Take 81 mg by mouth daily., Disp: , Rfl:  .  Brinzolamide-Brimonidine 1-0.2 % SUSP, Place 1 drop into both eyes 2 (two) times daily., Disp: , Rfl:  .  diclofenac sodium (VOLTAREN) 1 % GEL, Apply 4 g topically 4 (four) times daily as needed., Disp: 400 g, Rfl: 2 .  fluticasone (FLONASE) 50 MCG/ACT nasal spray, Place 2 sprays into both nostrils daily as needed for allergies.,  Disp: 16 g, Rfl: 3 .  PARoxetine (PAXIL) 10 MG tablet, Take 10 mg by mouth daily., Disp: , Rfl:  .  SYMBICORT 160-4.5 MCG/ACT inhaler, Inhale 2 puffs into the lungs daily as needed (shortness of breath). , Disp: , Rfl:  .  valACYclovir (VALTREX) 1000 MG tablet, TAKE 1/2 TABLET BY MOUTH TWICE DAILY FOR 3 DAYS OR 1 TABLET BY MOUTH EVERY DAY FOR 5 DAYS AT ONSET OF BREAKOUT, Disp: 30 tablet, Rfl: 0 .  predniSONE (DELTASONE) 20 MG tablet, Take 1 tablet (20 mg total) by mouth 2 (two) times daily with a meal. (Patient not taking: Reported on 01/07/2019), Disp: 10 tablet, Rfl: 0   Observations/Objective: Weight 190 lb 4 oz (86.3 kg).  Physical Exam  Physical Exam Constitutional:      General: The patient is not in acute distress. Pulmonary:     Effort: Pulmonary effort is normal. No respiratory distress.  Neurological:     Mental Status: The patient is alert and oriented to person, place, and time.  Grossly normal neuro exam.. no slurred speech, facial droop, cognition. Psychiatric:        Mood and Affect: Mood normal.        Behavior: Behavior normal.   Assessment and Plan   Intractable migraine with aura without status migrainosus   Symptoms very consistent with migraine.  Discussed migraine diary, healthy lifestyle.. increase water, sleep, eat regular meals etc. Will treat acute headache with  Sumatriptan.. reviewed use. He will go to ER if severe headache given limited exam on virtual visit today.  NSAIDs contraindicated.   Follow up with PCP or neuro if migraines continuing to occur > 2 times a month for likely preventative medication.   I discussed the assessment and treatment plan with the patient. The patient was provided an opportunity to ask questions and all were answered. The patient agreed with the plan and demonstrated an understanding of the instructions.   The patient was advised to call back or seek an in-person evaluation if the symptoms worsen or if the condition fails to  improve as anticipated.     Eliezer Lofts, MD

## 2019-01-07 NOTE — Patient Instructions (Addendum)
Recurrent Migraine Headache    Migraines are a type of headache, and they are usually stronger and more sudden than normal headaches (tension headaches). Migraines are characterized by an intense pulsing, throbbing pain that is usually only present on one side of the head. Sometimes, migraine headaches can cause nausea, vomiting, sensitivity to light and sound, and vision changes. Recurrent migraines keep coming back (recurring). A migraine can last from 4 hours up to 3 days.  What are the causes?  The exact cause of this condition is not known. However, a migraine may be caused when nerves in the brain become irritated and release chemicals that cause inflammation of blood vessels. This inflammation causes pain.  Certain things may also trigger migraines, such as:  · A disruption in your regular eating and sleeping schedule.  · Smoking.  · Stress.  · Menstruation.  · Certain foods and drinks, such as:  ? Aged cheese.  ? Chocolate.  ? Alcohol.  ? Caffeine.  ? Foods or drinks that contain nitrates, glutamate, aspartame, MSG, or tyramine.  · Lack of sleep.  · Hunger.  · Physical exertion.  · Fatigue.  · High altitude.  · Weather changes.  · Medicines, such as:  ? Nitroglycerin, which is used to treat chest pain.  ? Birth control pills.  ? Estrogen.  ? Some blood pressure medicines.  What are the signs or symptoms?  Symptoms of this condition vary for each person and may include:  · Pain that is usually only present on one side of the head. In some cases, the pain may be on both sides of the head or around the head or neck.  · Pulsating or throbbing pain.  · Severe pain that prevents daily activities.  · Pain that is aggravated by any physical activity.  · Nausea, vomiting, or both.  · Dizziness.  · Pain with exposure to bright lights, loud noises, or activity.  · General sensitivity to bright lights, loud noises, or smells.  Before you get a migraine, you may get warning signs that a migraine is coming (aura). An aura  may include:  · Seeing flashing lights.  · Seeing bright spots, halos, or zigzag lines.  · Having tunnel vision or blurred vision.  · Having numbness or a tingling feeling.  · Having trouble talking.  · Having muscle weakness.  · Smelling a certain odor.  How is this diagnosed?  This condition is often diagnosed based on:  · Your symptoms and medical history.  · A physical exam.  You may also have tests, including:  · A CT scan or MRI of your brain. These imaging tests cannot diagnose migraines, but they can help to rule out other causes of headaches.  · Blood tests.  How is this treated?  This condition is treated with:  · Medicines. These are used for:  ? Lessening pain and nausea.  ? Preventing recurrent migraines.  · Lifestyle changes, such as changes to your diet or sleeping patterns.  · Behavior therapy, such as relaxation training or biofeedback. Biofeedback is a treatment that involves teaching you to relax and use your brain to lower your heart rate and control your breathing.  Follow these instructions at home:  Medicines  · Take over-the-counter and prescription medicines only as told by your health care provider.  · Do not drive or use heavy machinery while taking prescription pain medicine.  Lifestyle  · Do not use any products that contain nicotine or tobacco, such as   sleep recommended by your health care provider.  Limit your stress. Talk with your health care provider if you need help with stress management.  Maintain a healthy weight. If you need help losing weight, ask your health care provider.  Exercise regularly. Aim for 150 minutes of moderate-intensity exercise (walking,  biking, yoga) or 75 minutes of vigorous exercise (running, circuit training, swimming) each week. General instructions   Keep a journal to find out what triggers your migraine headaches so you can avoid these triggers. For example, write down: ? What you eat and drink. ? How much sleep you get. ? Any change to your diet or medicines.  Lie down in a dark, quiet room when you have a migraine.  Try placing a cool towel over your head when you have a migraine.  Keep lights dim, if bright lights bother you and make your migraines worse.  Keep all follow-up visits as told by your health care provider. This is important. Contact a health care provider if:  Your pain does not improve, even with medicine.  Your migraines continue to return, even with medicine.  You have a fever.  You have weight loss. Get help right away if:  Your migraine becomes severe and medicine does not help.  You have a stiff neck.  You have a loss of vision.  You have muscle weakness or loss of muscle control.  You start losing your balance or have trouble walking.  You feel faint or you pass out.  You develop new, severe symptoms.  You start having abrupt severe headaches that last for a second or less, like a thunderclap. Summary  Migraine headaches are usually stronger and more sudden than normal headaches (tension headaches). Migraines are characterized by an intense pulsing, throbbing pain that is usually only present on one side of the head.  The exact cause of this condition is not known. However, a migraine may be caused when nerves in the brain become irritated and release chemicals that cause inflammation of blood vessels.  Certain things may trigger migraines, such as changes to diet or sleeping patterns, smoking, certain foods, alcohol, stress, and certain medicines.  Sometimes, migraine headaches can cause nausea, vomiting, sensitivity to light and sound, and vision changes.  Migraines  are often diagnosed based on your symptoms, medical history, and a physical exam. This information is not intended to replace advice given to you by your health care provider. Make sure you discuss any questions you have with your health care provider. Document Released: 01/13/2001 Document Revised: 04/23/2017 Document Reviewed: 01/31/2016 Elsevier Patient Education  2020 [employer].   Migraine Triggers in Diet  If you suspect that certain foods or drinks trigger your migraine, an elimination diet could help. It's not a sure thing, but if you stick with it, you might figure out what's bringing on your headache and prevent pain down the road.  If you decide to try an elimination diet, talk to your doctor. You'll want to make sure that it's safe for you and learn how to fine-tune the food plan for your needs.  How to Get Started In an elimination diet, you'll cut out foods and drinks that can trigger migraines from your meals and snacks, then slowly add them back, one by one. If your migraine symptoms return, you may be able to tell that it's because of a certain food.  Everyone is different, but there are some common foods that people find can bring on their migraine. You'll need to  cut out things like:  Chocolate Monosodium glutamate (MSG) Canned, cured, or processed meats and fish Cheese and dairy products Nuts Alcohol and vinegar Aspartame (NutraSweet) and saccharin (Sweet'N Low) Soy products (miso, tempeh, soy sauce) Olives Caffeine can be a trigger, but you can also get migraine if you stop suddenly. Also keep in mind that caffeine is an ingredient in some pain-relief headache medicines since it may help your body absorb the medicine better.  Some fruits and juices may trigger migraine. So you may find you'll need to cut from your diet things like citrus fruits, dried fruits, raspberries, red plums, papayas, passion fruit, figs, dates, and avocados.  You may also need to avoid  certain vegetables, like onions, pea pods, some beans, and sauerkraut.  Some baked goods that rise from yeast can also trigger migraine. It's possible you may need to stop eating things like sourdough, bagels, doughnuts, and coffeecake.

## 2019-01-11 ENCOUNTER — Encounter: Payer: Self-pay | Admitting: Internal Medicine

## 2019-01-12 DIAGNOSIS — M47816 Spondylosis without myelopathy or radiculopathy, lumbar region: Secondary | ICD-10-CM | POA: Diagnosis not present

## 2019-01-12 DIAGNOSIS — M7061 Trochanteric bursitis, right hip: Secondary | ICD-10-CM | POA: Diagnosis not present

## 2019-01-12 DIAGNOSIS — G894 Chronic pain syndrome: Secondary | ICD-10-CM | POA: Diagnosis not present

## 2019-01-13 ENCOUNTER — Ambulatory Visit (INDEPENDENT_AMBULATORY_CARE_PROVIDER_SITE_OTHER): Payer: Federal, State, Local not specified - PPO | Admitting: Internal Medicine

## 2019-01-13 ENCOUNTER — Encounter: Payer: Self-pay | Admitting: Internal Medicine

## 2019-01-13 ENCOUNTER — Other Ambulatory Visit: Payer: Self-pay

## 2019-01-13 VITALS — BP 116/78 | HR 72 | Temp 97.9°F | Ht 66.75 in | Wt 211.0 lb

## 2019-01-13 DIAGNOSIS — G8929 Other chronic pain: Secondary | ICD-10-CM

## 2019-01-13 DIAGNOSIS — M545 Low back pain: Secondary | ICD-10-CM | POA: Diagnosis not present

## 2019-01-13 DIAGNOSIS — Z0000 Encounter for general adult medical examination without abnormal findings: Secondary | ICD-10-CM

## 2019-01-13 DIAGNOSIS — E785 Hyperlipidemia, unspecified: Secondary | ICD-10-CM

## 2019-01-13 DIAGNOSIS — G43119 Migraine with aura, intractable, without status migrainosus: Secondary | ICD-10-CM | POA: Diagnosis not present

## 2019-01-13 DIAGNOSIS — H9192 Unspecified hearing loss, left ear: Secondary | ICD-10-CM

## 2019-01-13 DIAGNOSIS — R739 Hyperglycemia, unspecified: Secondary | ICD-10-CM

## 2019-01-13 DIAGNOSIS — F32A Depression, unspecified: Secondary | ICD-10-CM

## 2019-01-13 DIAGNOSIS — F329 Major depressive disorder, single episode, unspecified: Secondary | ICD-10-CM | POA: Diagnosis not present

## 2019-01-13 MED ORDER — SUMATRIPTAN SUCCINATE 100 MG PO TABS: ORAL_TABLET | ORAL | 11 refills | Status: DC

## 2019-01-13 MED ORDER — ATORVASTATIN CALCIUM 10 MG PO TABS: 10.0000 mg | ORAL_TABLET | Freq: Every day | ORAL | 3 refills | Status: DC

## 2019-01-13 NOTE — Patient Instructions (Addendum)
Your left ear was cleared of wax today  Please take all new medication as prescribed - the generic for lipitor 10 mg per day  Please continue all other medications as before, and refills have been done if requested - the imitrex  Please have the pharmacy call with any other refills you may need.  Please continue your efforts at being more active, low cholesterol diet, and weight control.  You are otherwise up to date with prevention measures today.  Please keep your appointments with your specialists as you may have planned - orthopedic for the back and right hip, and [facility]  Please return in 6 months, or sooner if needed, with Lab testing done 3-5 days before

## 2019-01-13 NOTE — Progress Notes (Signed)
Subjective:    Patient ID: Anthony [name], male    DOB: 02-09-1966, 53 y.o.   MRN: [id number]  HPI  Here to f/u; overall doing ok,  Pt denies chest pain, increasing sob or doe, wheezing, orthopnea, PND, increased LE swelling, palpitations, dizziness or syncope.  Pt denies new neurological symptoms such as new facial or extremity weakness or numbness, except for recurrent migraine with no change in frequency and severity.  Pt denies polydipsia, polyuria, or low sugar episode.  Pt states overall good compliance with meds, mostly trying to follow appropriate diet, with wt overall stable,  but little exercise however.  S/p injection to right hip bursa yesterday, plan is for for LS spine ESI next wk per ortho.  Denies worsening depressive symptoms, suicidal ideation, or panic; has ongoing anxiety and has f/u at [facility] psychiatry.  Pt continues to have recurring LBP without change in severity, bowel or bladder change, fever, wt loss,  worsening LE pain/numbness/weakness, gait change or falls.  Also has new onset 1 wks left hearing loss and wondering if wax again Past Medical History:  Diagnosis Date   Anemia    Anxiety    Asthma    Cataract    Depression    DVT (deep venous thrombosis) (HCC)    Fatty liver    Genital herpes 05/06/2018   H/O renal calculi 2006   Hyperlipidemia    Internal hemorrhoid    Kidney stones    PE (pulmonary embolism)    Peptic ulcer disease    Pneumonia    Spondylosis    Tubular adenoma of colon 2011   Dr Olevia Perches   Past Surgical History:  Procedure Laterality Date   CATARACT EXTRACTION Left 1996   Dr Elonda Husky; [facility]   colonoscopy with polypectomy  2011   Dr Olevia Perches, hemorrhoids   CYSTOSCOPY W/ STONE MANIPULATION  2006   INGUINAL HERNIA REPAIR Right 1990   SHOULDER SURGERY Right 12/17/2010   Dr French Ana ; shoulder impingement & torn tendon    reports that he has never smoked. He has never used smokeless tobacco. He reports current alcohol use. He reports  that he does not use drugs. family history includes Cancer in his paternal grandmother; Diabetes in his father; Glaucoma in his father; Heart attack in his maternal uncle; Heart attack (age of onset: 82) in his mother; Heart attack (age of onset: 37) in his maternal grandmother; Stroke (age of onset: 53) in his father. Allergies  Allergen Reactions   Aleve [Naproxen] Rash    Patient stated it was years ago   Nsaids Other (See Comments)    Bleeding ulcers   Current Outpatient Medications on File Prior to Visit  Medication Sig Dispense Refill   albuterol (PROVENTIL HFA;VENTOLIN HFA) 108 (90 Base) MCG/ACT inhaler Inhale 2 puffs into the lungs every 6 (six) hours as needed. For shortness of breath 1 Inhaler 1   aspirin EC 81 MG tablet Take 81 mg by mouth daily.     Brinzolamide-Brimonidine 1-0.2 % SUSP Place 1 drop into both eyes 2 (two) times daily.     diclofenac sodium (VOLTAREN) 1 % GEL Apply 4 g topically 4 (four) times daily as needed. 400 g 2   fluticasone (FLONASE) 50 MCG/ACT nasal spray Place 2 sprays into both nostrils daily as needed for allergies. 16 g 3   PARoxetine (PAXIL) 10 MG tablet Take 10 mg by mouth daily.     predniSONE (DELTASONE) 20 MG tablet Take 1 tablet (20 mg total)  by mouth 2 (two) times daily with a meal. 10 tablet 0   SYMBICORT 160-4.5 MCG/ACT inhaler Inhale 2 puffs into the lungs daily as needed (shortness of breath).      valACYclovir (VALTREX) 1000 MG tablet TAKE 1/2 TABLET BY MOUTH TWICE DAILY FOR 3 DAYS OR 1 TABLET BY MOUTH EVERY DAY FOR 5 DAYS AT ONSET OF BREAKOUT 30 tablet 0   No current facility-administered medications on file prior to visit.    Review of Systems  Constitutional: Negative for other unusual diaphoresis or sweats HENT: Negative for ear discharge or swelling Eyes: Negative for other worsening visual disturbances Respiratory: Negative for stridor or other swelling  Gastrointestinal: Negative for worsening distension or other  blood Genitourinary: Negative for retention or other urinary change Musculoskeletal: Negative for other MSK pain or swelling Skin: Negative for color change or other new lesions Neurological: Negative for worsening tremors and other numbness  Psychiatric/Behavioral: Negative for worsening agitation or other fatigue All other system neg per pt    Objective:   Physical Exam BP 116/78    Pulse 72    Temp 97.9 F (36.6 C) (Oral)    Ht 5' 6.75" (1.695 m)    Wt 211 lb (95.7 kg)    SpO2 96%    BMI 33.30 kg/m  VS noted,  Constitutional: Pt appears in NAD HENT: Head: NCAT.  Right Ear: External ear normal.  Left Ear: External ear normal. left canal wax impaction resolved and hearing improved with irrigation Eyes: . Pupils are equal, round, and reactive to light. Conjunctivae and EOM are normal Nose: without d/c or deformity Neck: Neck supple. Gross normal ROM Cardiovascular: Normal rate and regular rhythm.   Pulmonary/Chest: Effort normal and breath sounds without rales or wheezing.  Abd:  Soft, NT, ND, + BS, no organomegaly Neurological: Pt is alert. At baseline orientation, motor grossly intact Skin: Skin is warm. No rashes, other new lesions, no LE edema Psychiatric: Pt behavior is normal without agitation  No other exam findings Lab Results  Component Value Date   WBC 5.2 05/06/2018   HGB 15.4 05/06/2018   HCT 45.7 05/06/2018   PLT 250.0 05/06/2018   GLUCOSE 92 11/08/2018   CHOL 211 (H) 11/08/2018   TRIG 159.0 (H) 11/08/2018   HDL 59.90 11/08/2018   LDLDIRECT 148.7 04/15/2012   LDLCALC 119 (H) 11/08/2018   ALT 19 05/06/2018   AST 15 05/06/2018   NA 136 11/08/2018   K 4.1 11/08/2018   CL 103 11/08/2018   CREATININE 0.95 11/08/2018   BUN 12 11/08/2018   CO2 24 11/08/2018   TSH 0.72 05/06/2018   PSA 0.73 05/06/2018   HGBA1C 6.0 11/08/2018      Assessment & Plan:

## 2019-01-14 ENCOUNTER — Encounter: Payer: Self-pay | Admitting: Internal Medicine

## 2019-01-14 DIAGNOSIS — F32A Depression, unspecified: Secondary | ICD-10-CM | POA: Insufficient documentation

## 2019-01-14 DIAGNOSIS — M545 Low back pain, unspecified: Secondary | ICD-10-CM | POA: Insufficient documentation

## 2019-01-14 DIAGNOSIS — F329 Major depressive disorder, single episode, unspecified: Secondary | ICD-10-CM | POA: Insufficient documentation

## 2019-01-14 DIAGNOSIS — H9192 Unspecified hearing loss, left ear: Secondary | ICD-10-CM | POA: Insufficient documentation

## 2019-01-14 DIAGNOSIS — F4312 Post-traumatic stress disorder, chronic: Secondary | ICD-10-CM | POA: Insufficient documentation

## 2019-01-14 NOTE — Assessment & Plan Note (Signed)
To start lipitor 10 mg per day

## 2019-01-14 NOTE — Assessment & Plan Note (Signed)
Stable, to cont same tx, for [place] psychiatry f/u

## 2019-01-14 NOTE — Assessment & Plan Note (Signed)
Resolved with irrigation, hearing improved,  to f/u any worsening symptoms or concerns 

## 2019-01-14 NOTE — Assessment & Plan Note (Signed)
For ortho f/u as planned, stable overall by history and exam, recent data reviewed with pt, and pt to continue medical treatment as before,  to f/u any worsening symptoms or concerns

## 2019-01-14 NOTE — Assessment & Plan Note (Signed)
stable overall by history and exam, recent data reviewed with pt, and pt to continue medical treatment as before,  to f/u any worsening symptoms or concerns  

## 2019-01-14 NOTE — Assessment & Plan Note (Addendum)
Mild to mod, for imitrex prn, to f/u any worsening symptoms or concerns  Note:  Total time for pt hx, exam, review of record with pt in the room, determination of diagnoses and plan for further eval and tx is > 40 min, with over 50% spent in coordination and counseling of patient including the differential dx, tx, further evaluation and other management of migraine, left hearing loss, hyperglycemia, HLD, depression, low back pain

## 2019-01-18 ENCOUNTER — Encounter: Payer: Self-pay | Admitting: Internal Medicine

## 2019-01-18 MED ORDER — RIZATRIPTAN BENZOATE 10 MG PO TBDP: 10.0000 mg | ORAL_TABLET | ORAL | 2 refills | Status: DC | PRN

## 2019-01-19 DIAGNOSIS — G43719 Chronic migraine without aura, intractable, without status migrainosus: Secondary | ICD-10-CM | POA: Diagnosis not present

## 2019-01-23 DIAGNOSIS — M47816 Spondylosis without myelopathy or radiculopathy, lumbar region: Secondary | ICD-10-CM | POA: Diagnosis not present

## 2019-01-25 DIAGNOSIS — K279 Peptic ulcer, site unspecified, unspecified as acute or chronic, without hemorrhage or perforation: Secondary | ICD-10-CM | POA: Diagnosis not present

## 2019-01-25 DIAGNOSIS — F4310 Post-traumatic stress disorder, unspecified: Secondary | ICD-10-CM | POA: Diagnosis not present

## 2019-01-25 DIAGNOSIS — F329 Major depressive disorder, single episode, unspecified: Secondary | ICD-10-CM | POA: Diagnosis not present

## 2019-01-25 DIAGNOSIS — R51 Headache: Secondary | ICD-10-CM | POA: Diagnosis not present

## 2019-01-26 DIAGNOSIS — F329 Major depressive disorder, single episode, unspecified: Secondary | ICD-10-CM | POA: Diagnosis not present

## 2019-01-26 DIAGNOSIS — K279 Peptic ulcer, site unspecified, unspecified as acute or chronic, without hemorrhage or perforation: Secondary | ICD-10-CM | POA: Diagnosis not present

## 2019-01-26 DIAGNOSIS — R300 Dysuria: Secondary | ICD-10-CM | POA: Diagnosis not present

## 2019-01-26 DIAGNOSIS — Z1321 Encounter for screening for nutritional disorder: Secondary | ICD-10-CM | POA: Diagnosis not present

## 2019-01-26 DIAGNOSIS — Z13220 Encounter for screening for lipoid disorders: Secondary | ICD-10-CM | POA: Diagnosis not present

## 2019-01-30 ENCOUNTER — Encounter: Payer: Self-pay | Admitting: Internal Medicine

## 2019-03-20 ENCOUNTER — Other Ambulatory Visit: Payer: Self-pay

## 2019-03-20 DIAGNOSIS — Z20822 Contact with and (suspected) exposure to covid-19: Secondary | ICD-10-CM

## 2019-03-22 LAB — NOVEL CORONAVIRUS, NAA: SARS-CoV-2, NAA: NOT DETECTED

## 2019-04-18 ENCOUNTER — Encounter: Payer: Self-pay | Admitting: Internal Medicine

## 2019-04-20 ENCOUNTER — Encounter: Payer: Self-pay | Admitting: Internal Medicine

## 2019-04-20 ENCOUNTER — Other Ambulatory Visit (INDEPENDENT_AMBULATORY_CARE_PROVIDER_SITE_OTHER): Payer: Federal, State, Local not specified - PPO

## 2019-04-20 ENCOUNTER — Ambulatory Visit (INDEPENDENT_AMBULATORY_CARE_PROVIDER_SITE_OTHER): Payer: Federal, State, Local not specified - PPO | Admitting: Internal Medicine

## 2019-04-20 ENCOUNTER — Other Ambulatory Visit: Payer: Self-pay

## 2019-04-20 VITALS — BP 116/70 | HR 93 | Temp 97.6°F | Ht 66.75 in | Wt 214.0 lb

## 2019-04-20 DIAGNOSIS — M47816 Spondylosis without myelopathy or radiculopathy, lumbar region: Secondary | ICD-10-CM

## 2019-04-20 DIAGNOSIS — R739 Hyperglycemia, unspecified: Secondary | ICD-10-CM

## 2019-04-20 DIAGNOSIS — E785 Hyperlipidemia, unspecified: Secondary | ICD-10-CM

## 2019-04-20 DIAGNOSIS — R202 Paresthesia of skin: Secondary | ICD-10-CM | POA: Diagnosis not present

## 2019-04-20 LAB — LIPID PANEL
Cholesterol: 170 mg/dL (ref 0–200)
HDL: 47.1 mg/dL (ref >39.00)
NonHDL: 122.61
Total CHOL/HDL Ratio: 4
Triglycerides: 247 mg/dL — ABNORMAL HIGH (ref 0.0–149.0)
VLDL: 49.4 mg/dL — ABNORMAL HIGH (ref 0.0–40.0)

## 2019-04-20 LAB — LDL CHOLESTEROL, DIRECT: Direct LDL: 89 mg/dL

## 2019-04-20 NOTE — Assessment & Plan Note (Addendum)
Stable, Without worsening pain,  to f/u any worsening symptoms or concerns

## 2019-04-20 NOTE — Assessment & Plan Note (Signed)
stable overall by history and exam, recent data reviewed with pt, and pt to continue medical treatment as before,  to f/u any worsening symptoms or concerns, for lab f/u 

## 2019-04-20 NOTE — Progress Notes (Signed)
Subjective:    Patient ID: Anthony [name], male    DOB: 01-04-1966, 53 y.o.   MRN: [id number]  HPI  Here with mention of bilateral feet numbness usually at the same time, but can be one or there other; seemed to start with jan 2020 temporarily short times with sitting on commode and that seemed to improve recently, but now with more tingling without sittting on commode;  Not better with different shoes except maybe some better yesterday with slip on shoes; no recent increased activity such as more standing or walking or trauma; has been using the elliptical machine recently but none this week, and not sure if more doing this.  Pt continues to have recurring LBP without change in severity, bowel or bladder change, fever, wt loss,  worsening LE pain/weakness, gait change or falls.  Recent b12 and a1c normal in July 2020. 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 Pt denies chest pain, increased sob or doe, wheezing, orthopnea, PND, increased LE swelling, palpitations, dizziness or syncope.  Pt denies new neurological symptoms such as new headache, or facial or extremity weakness or numbness   Pt denies polydipsia, polyuria.  Asks for lipi panel today Past Medical History:  Diagnosis Date  . Anemia   . Anxiety   . Asthma   . Cataract   . Depression   . DVT (deep venous thrombosis) ([place])   . Fatty liver   . Genital herpes 05/06/2018  . H/O renal calculi 2006  . Hyperlipidemia   . Internal hemorrhoid   . Kidney stones   . PE (pulmonary embolism)   . Peptic ulcer disease   . Pneumonia   . Spondylosis   . Tubular adenoma of colon 2011   Dr Olevia Perches   Past Surgical History:  Procedure Laterality Date  . CATARACT EXTRACTION Left 1996   Dr Elonda Husky; [facility]  . colonoscopy with polypectomy  2011   Dr Olevia Perches, hemorrhoids  . CYSTOSCOPY W/ STONE MANIPULATION  2006  . INGUINAL HERNIA REPAIR Right 1990  . SHOULDER SURGERY Right 12/17/2010   Dr French Ana ; shoulder impingement & torn tendon    reports that he has  never smoked. He has never used smokeless tobacco. He reports current alcohol use. He reports that he does not use drugs. family history includes Cancer in his paternal grandmother; Diabetes in his father; Glaucoma in his father; Heart attack in his maternal uncle; Heart attack (age of onset: 82) in his mother; Heart attack (age of onset: 40) in his maternal grandmother; Stroke (age of onset: 56) in his father. Allergies  Allergen Reactions  . Aleve [Naproxen] Rash    Patient stated it was years ago  . Nsaids Other (See Comments)    Bleeding ulcers   Current Outpatient Medications on File Prior to Visit  Medication Sig Dispense Refill  . albuterol (PROVENTIL HFA;VENTOLIN HFA) 108 (90 Base) MCG/ACT inhaler Inhale 2 puffs into the lungs every 6 (six) hours as needed. For shortness of breath 1 Inhaler 1  . aspirin EC 81 MG tablet Take 81 mg by mouth daily.    Marland Kitchen atorvastatin (LIPITOR) 10 MG tablet Take 1 tablet (10 mg total) by mouth daily. 90 tablet 3  . Brinzolamide-Brimonidine 1-0.2 % SUSP Place 1 drop into both eyes 2 (two) times daily.    . diclofenac sodium (VOLTAREN) 1 % GEL Apply 4 g topically 4 (four) times daily as needed. 400 g 2  . fluticasone (FLONASE) 50 MCG/ACT nasal spray Place 2 sprays into  both nostrils daily as needed for allergies. 16 g 3  . PARoxetine (PAXIL) 10 MG tablet Take 10 mg by mouth daily.    . predniSONE (DELTASONE) 20 MG tablet Take 1 tablet (20 mg total) by mouth 2 (two) times daily with a meal. 10 tablet 0  . rizatriptan (MAXALT-MLT) 10 MG disintegrating tablet Take 1 tablet (10 mg total) by mouth as needed for migraine. May repeat in 2 hours if needed 10 tablet 2  . SYMBICORT 160-4.5 MCG/ACT inhaler Inhale 2 puffs into the lungs daily as needed (shortness of breath).     . valACYclovir (VALTREX) 1000 MG tablet TAKE 1/2 TABLET BY MOUTH TWICE DAILY FOR 3 DAYS OR 1 TABLET BY MOUTH EVERY DAY FOR 5 DAYS AT ONSET OF BREAKOUT 30 tablet 0   No current  facility-administered medications on file prior to visit.   Review of Systems  Constitutional: Negative for other unusual diaphoresis or sweats HENT: Negative for ear discharge or swelling Eyes: Negative for other worsening visual disturbances Respiratory: Negative for stridor or other swelling  Gastrointestinal: Negative for worsening distension or other blood Genitourinary: Negative for retention or other urinary change Musculoskeletal: Negative for other MSK pain or swelling Skin: Negative for color change or other new lesions Neurological: Negative for worsening tremors and other numbness  Psychiatric/Behavioral: Negative for worsening agitation or other fatigue All otherwise neg per pt     Objective:   Physical Exam BP 116/70   Pulse 93   Temp 97.6 F (36.4 C) (Oral)   Ht 5' 6.75" (1.695 m)   Wt 214 lb (97.1 kg)   SpO2 97%   BMI 33.77 kg/m  VS noted,  Constitutional: Pt appears in NAD HENT: Head: NCAT.  Right Ear: External ear normal.  Left Ear: External ear normal.  Eyes: . Pupils are equal, round, and reactive to light. Conjunctivae and EOM are normal Nose: without d/c or deformity Neck: Neck supple. Gross normal ROM Cardiovascular: Normal rate and regular rhythm.   Pulmonary/Chest: Effort normal and breath sounds without rales or wheezing.  Abd:  Soft, NT, ND, + BS, no organomegaly Neurological: Pt is alert. At baseline orientation, motor grossly intact Skin: Skin is warm. No rashes, other new lesions, no LE edema Psychiatric: Pt behavior is normal without agitation  All otherwise neg per pt  Lab Results  Component Value Date   WBC 5.2 05/06/2018   HGB 15.4 05/06/2018   HCT 45.7 05/06/2018   PLT 250.0 05/06/2018   GLUCOSE 92 11/08/2018   CHOL 211 (H) 11/08/2018   TRIG 159.0 (H) 11/08/2018   HDL 59.90 11/08/2018   LDLDIRECT 148.7 04/15/2012   LDLCALC 119 (H) 11/08/2018   ALT 19 05/06/2018   AST 15 05/06/2018   NA 136 11/08/2018   K 4.1 11/08/2018   CL  103 11/08/2018   CREATININE 0.95 11/08/2018   BUN 12 11/08/2018   CO2 24 11/08/2018   TSH 0.72 05/06/2018   PSA 0.73 05/06/2018   HGBA1C 6.0 11/08/2018      Assessment & Plan:

## 2019-04-20 NOTE — Assessment & Plan Note (Signed)
stable overall by history and exam, recent data reviewed with pt, and pt to continue medical treatment as before,  to f/u any worsening symptoms or concerns  

## 2019-04-20 NOTE — Patient Instructions (Addendum)
You will be contacted regarding the referral for: nerve testing for the legs  Please continue all other medications as before, and refills have been done if requested.  Please have the pharmacy call with any other refills you may need.  Please continue your efforts at being more active, low cholesterol diet, and weight control.  Please keep your appointments with your specialists as you may have planned  Please go to the LAB in the Basement (turn left off the elevator) for the tests to be done today  You will be contacted by phone if any changes need to be made immediately.  Otherwise, you will receive a letter about your results with an explanation, but please check with MyChart first.  Please remember to sign up for MyChart if you have not done so, as this will be important to you in the future with finding out test results, communicating by private email, and scheduling acute appointments online when needed.

## 2019-04-20 NOTE — Assessment & Plan Note (Addendum)
Suspect lumbar related, for NCS/emg to r/o lumbar vs distal neuropathy  Note:  Total time for pt hx, exam, review of record with pt in the room, determination of diagnoses and plan for further eval and tx is > 40 min, with over 50% spent in coordination and counseling of patient including the differential dx, tx, further evaluation and other management of paresthesia, HLD, hyperglycemia, chronic low back pain

## 2019-04-21 ENCOUNTER — Encounter: Payer: Self-pay | Admitting: Internal Medicine

## 2019-05-01 ENCOUNTER — Encounter: Payer: Self-pay | Admitting: Internal Medicine

## 2019-05-01 DIAGNOSIS — R519 Headache, unspecified: Secondary | ICD-10-CM | POA: Diagnosis not present

## 2019-05-03 ENCOUNTER — Encounter: Payer: Self-pay | Admitting: Internal Medicine

## 2019-05-07 DIAGNOSIS — Z20822 Contact with and (suspected) exposure to covid-19: Secondary | ICD-10-CM | POA: Diagnosis not present

## 2019-05-08 ENCOUNTER — Encounter: Payer: Self-pay | Admitting: Internal Medicine

## 2019-05-11 ENCOUNTER — Telehealth: Payer: Self-pay | Admitting: Gastroenterology

## 2019-05-11 ENCOUNTER — Encounter: Payer: Self-pay | Admitting: Internal Medicine

## 2019-05-11 ENCOUNTER — Other Ambulatory Visit: Payer: Self-pay

## 2019-05-11 ENCOUNTER — Ambulatory Visit (INDEPENDENT_AMBULATORY_CARE_PROVIDER_SITE_OTHER): Payer: Federal, State, Local not specified - PPO | Admitting: Internal Medicine

## 2019-05-11 VITALS — BP 110/74 | HR 78 | Temp 98.1°F | Resp 12 | Ht 66.75 in | Wt 217.8 lb

## 2019-05-11 DIAGNOSIS — E611 Iron deficiency: Secondary | ICD-10-CM

## 2019-05-11 DIAGNOSIS — E559 Vitamin D deficiency, unspecified: Secondary | ICD-10-CM | POA: Diagnosis not present

## 2019-05-11 DIAGNOSIS — R739 Hyperglycemia, unspecified: Secondary | ICD-10-CM | POA: Diagnosis not present

## 2019-05-11 DIAGNOSIS — R1013 Epigastric pain: Secondary | ICD-10-CM | POA: Diagnosis not present

## 2019-05-11 DIAGNOSIS — R109 Unspecified abdominal pain: Secondary | ICD-10-CM | POA: Insufficient documentation

## 2019-05-11 DIAGNOSIS — N200 Calculus of kidney: Secondary | ICD-10-CM | POA: Insufficient documentation

## 2019-05-11 DIAGNOSIS — R1031 Right lower quadrant pain: Secondary | ICD-10-CM

## 2019-05-11 DIAGNOSIS — K5909 Other constipation: Secondary | ICD-10-CM | POA: Insufficient documentation

## 2019-05-11 DIAGNOSIS — Z0001 Encounter for general adult medical examination with abnormal findings: Secondary | ICD-10-CM

## 2019-05-11 DIAGNOSIS — Z125 Encounter for screening for malignant neoplasm of prostate: Secondary | ICD-10-CM | POA: Diagnosis not present

## 2019-05-11 DIAGNOSIS — K5900 Constipation, unspecified: Secondary | ICD-10-CM

## 2019-05-11 DIAGNOSIS — G8929 Other chronic pain: Secondary | ICD-10-CM

## 2019-05-11 DIAGNOSIS — R195 Other fecal abnormalities: Secondary | ICD-10-CM

## 2019-05-11 DIAGNOSIS — E538 Deficiency of other specified B group vitamins: Secondary | ICD-10-CM | POA: Diagnosis not present

## 2019-05-11 LAB — HEPATIC FUNCTION PANEL
ALT: 42 U/L (ref 0–53)
AST: 26 U/L (ref 0–37)
Albumin: 4.4 g/dL (ref 3.5–5.2)
Alkaline Phosphatase: 73 U/L (ref 39–117)
Bilirubin, Direct: 0.1 mg/dL (ref 0.0–0.3)
Total Bilirubin: 0.6 mg/dL (ref 0.2–1.2)
Total Protein: 7.1 g/dL (ref 6.0–8.3)

## 2019-05-11 LAB — HEMOGLOBIN A1C: Hgb A1c MFr Bld: 6 % (ref 4.6–6.5)

## 2019-05-11 LAB — LIPID PANEL
Cholesterol: 143 mg/dL (ref 0–200)
HDL: 51 mg/dL (ref >39.00)
LDL Cholesterol: 76 mg/dL (ref 0–99)
NonHDL: 92.26
Total CHOL/HDL Ratio: 3
Triglycerides: 81 mg/dL (ref 0.0–149.0)
VLDL: 16.2 mg/dL (ref 0.0–40.0)

## 2019-05-11 LAB — IBC PANEL
Iron: 79 ug/dL (ref 42–165)
Saturation Ratios: 23.7 % (ref 20.0–50.0)
Transferrin: 238 mg/dL (ref 212.0–360.0)

## 2019-05-11 LAB — VITAMIN B12: Vitamin B-12: 575 pg/mL (ref 211–911)

## 2019-05-11 LAB — BASIC METABOLIC PANEL
BUN: 11 mg/dL (ref 6–23)
CO2: 27 mEq/L (ref 19–32)
Calcium: 9.8 mg/dL (ref 8.4–10.5)
Chloride: 106 mEq/L (ref 96–112)
Creatinine, Ser: 0.95 mg/dL (ref 0.40–1.50)
GFR: 100.62 mL/min (ref >60.00)
Glucose, Bld: 95 mg/dL (ref 70–99)
Potassium: 4.4 mEq/L (ref 3.5–5.1)
Sodium: 139 mEq/L (ref 135–145)

## 2019-05-11 LAB — PSA: PSA: 0.56 ng/mL (ref 0.10–4.00)

## 2019-05-11 LAB — CBC WITH DIFFERENTIAL/PLATELET
Basophils Absolute: 0 10*3/uL (ref 0.0–0.1)
Basophils Relative: 0.9 % (ref 0.0–3.0)
Eosinophils Absolute: 0.2 10*3/uL (ref 0.0–0.7)
Eosinophils Relative: 4.3 % (ref 0.0–5.0)
HCT: 44.3 % (ref 39.0–52.0)
Hemoglobin: 14.7 g/dL (ref 13.0–17.0)
Lymphocytes Relative: 31.1 % (ref 12.0–46.0)
Lymphs Abs: 1.7 10*3/uL (ref 0.7–4.0)
MCHC: 33.2 g/dL (ref 30.0–36.0)
MCV: 85.3 fl (ref 78.0–100.0)
Monocytes Absolute: 0.5 10*3/uL (ref 0.1–1.0)
Monocytes Relative: 8.8 % (ref 3.0–12.0)
Neutro Abs: 3 10*3/uL (ref 1.4–7.7)
Neutrophils Relative %: 54.9 % (ref 43.0–77.0)
Platelets: 237 10*3/uL (ref 150.0–400.0)
RBC: 5.19 Mil/uL (ref 4.22–5.81)
RDW: 15.6 % — ABNORMAL HIGH (ref 11.5–15.5)
WBC: 5.4 10*3/uL (ref 4.0–10.5)

## 2019-05-11 LAB — URINALYSIS, ROUTINE W REFLEX MICROSCOPIC
Bilirubin Urine: NEGATIVE
Hgb urine dipstick: NEGATIVE
Ketones, ur: NEGATIVE
Leukocytes,Ua: NEGATIVE
Nitrite: NEGATIVE
RBC / HPF: NONE SEEN (ref 0–?)
Specific Gravity, Urine: 1.02 (ref 1.000–1.030)
Total Protein, Urine: NEGATIVE
Urine Glucose: NEGATIVE
Urobilinogen, UA: 0.2 (ref 0.0–1.0)
pH: 6.5 (ref 5.0–8.0)

## 2019-05-11 LAB — FERRITIN: Ferritin: 121.9 ng/mL (ref 22.0–322.0)

## 2019-05-11 LAB — VITAMIN D 25 HYDROXY (VIT D DEFICIENCY, FRACTURES): VITD: 34.98 ng/mL (ref 30.00–100.00)

## 2019-05-11 LAB — TSH: TSH: 0.99 u[IU]/mL (ref 0.35–4.50)

## 2019-05-11 MED ORDER — POLYETHYLENE GLYCOL 3350 17 GM/SCOOP PO POWD: 17.0000 g | Freq: Two times a day (BID) | ORAL | 1 refills | Status: DC | PRN

## 2019-05-11 MED ORDER — PANTOPRAZOLE SODIUM 40 MG PO TBEC: 40.0000 mg | DELAYED_RELEASE_TABLET | Freq: Every day | ORAL | 3 refills | Status: DC

## 2019-05-11 MED ORDER — DOCUSATE SODIUM 100 MG PO CAPS: 100.0000 mg | ORAL_CAPSULE | Freq: Two times a day (BID) | ORAL | 0 refills | Status: DC

## 2019-05-11 NOTE — Progress Notes (Signed)
Subjective:    Patient ID: Anthony [name], male    DOB: 1965/06/27, 54 y.o.   MRN: [id number]  HPI  Here for wellness and f/u;  Overall doing ok;  Pt denies Chest pain, worsening SOB, DOE, wheezing, orthopnea, PND, worsening LE edema, palpitations, dizziness or syncope.  Pt denies neurological change such as new headache, facial or extremity weakness.  Pt denies polydipsia, polyuria, or low sugar symptoms. Pt states overall good compliance with treatment and medications, good tolerability, and has been trying to follow appropriate diet.  Pt denies worsening depressive symptoms, suicidal ideation or panic. No fever, night sweats, wt loss, loss of appetite, or other constitutional symptoms.  Pt states good ability with ADL's, has low fall risk, home safety reviewed and adequate, no other significant changes in hearing or vision, and only occasionally active with exercise. Here with several dark stools around xmas time, but more normal color in the last few days, has been softer but now more constipated it seems.  Had daily BM , with darker stools for about 10 yrs. Assoc with gas and noses in the stomach.  No abd pain, n/v, fever, no BRB, Pt denies chest pain, increased sob or doe, wheezing, orthopnea, PND, increased LE swelling, palpitations, dizziness or syncope. No falls.  Appetite ok.  On asa, takes every day.  No ETOH recently, last about dec 21.No recnet NSAIDS due to hx of PUD assoc with bleeding UGI with dark stools 2016 - hospd about 2-3 days, had EGD per GI at [place]  [facility].  Did have turnip greens over xmas " a whole lot" .  No pepto bismol.  Takes MVI but no extra iron.  Not currently taking PPI.   Past Medical History:  Diagnosis Date  . Anemia   . Anxiety   . Asthma   . Cataract   . Depression   . DVT (deep venous thrombosis) ([place])   . Fatty liver   . Genital herpes 05/06/2018  . H/O renal calculi 2006  . Hyperlipidemia   . Internal hemorrhoid   . Kidney stones   .  PE (pulmonary embolism)   . Peptic ulcer disease   . Pneumonia   . Spondylosis   . Tubular adenoma of colon 2011   Dr Olevia Perches   Past Surgical History:  Procedure Laterality Date  . CATARACT EXTRACTION Left 1996   Dr Elonda Husky; [facility]  . colonoscopy with polypectomy  2011   Dr Olevia Perches, hemorrhoids  . CYSTOSCOPY W/ STONE MANIPULATION  2006  . INGUINAL HERNIA REPAIR Right 1990  . SHOULDER SURGERY Right 12/17/2010   Dr French Ana ; shoulder impingement & torn tendon    reports that he has never smoked. He has never used smokeless tobacco. He reports current alcohol use. He reports that he does not use drugs. family history includes Cancer in his paternal grandmother; Diabetes in his father; Glaucoma in his father; Heart attack in his maternal uncle; Heart attack (age of onset: 43) in his mother; Heart attack (age of onset: 33) in his maternal grandmother; Stroke (age of onset: 85) in his father. Allergies  Allergen Reactions  . Aleve [Naproxen] Rash    Patient stated it was years ago  . Nsaids Other (See Comments)    Bleeding ulcers   Current Outpatient Medications on File Prior to Visit  Medication Sig Dispense Refill  . albuterol (PROVENTIL HFA;VENTOLIN HFA) 108 (90 Base) MCG/ACT inhaler Inhale 2 puffs into the lungs every 6 (six) hours as  needed. For shortness of breath 1 Inhaler 1  . aspirin EC 81 MG tablet Take 81 mg by mouth daily.    Marland Kitchen atorvastatin (LIPITOR) 10 MG tablet Take 1 tablet (10 mg total) by mouth daily. 90 tablet 3  . Brinzolamide-Brimonidine 1-0.2 % SUSP Place 1 drop into both eyes 2 (two) times daily.    . diclofenac sodium (VOLTAREN) 1 % GEL Apply 4 g topically 4 (four) times daily as needed. 400 g 2  . fluticasone (FLONASE) 50 MCG/ACT nasal spray Place 2 sprays into both nostrils daily as needed for allergies. 16 g 3  . PARoxetine (PAXIL) 10 MG tablet Take 10 mg by mouth daily.    . rizatriptan (MAXALT-MLT) 10 MG disintegrating tablet Take 1 tablet (10 mg total) by mouth as  needed for migraine. May repeat in 2 hours if needed 10 tablet 2  . SYMBICORT 160-4.5 MCG/ACT inhaler Inhale 2 puffs into the lungs daily as needed (shortness of breath).     . valACYclovir (VALTREX) 1000 MG tablet TAKE 1/2 TABLET BY MOUTH TWICE DAILY FOR 3 DAYS OR 1 TABLET BY MOUTH EVERY DAY FOR 5 DAYS AT ONSET OF BREAKOUT 30 tablet 0   No current facility-administered medications on file prior to visit.   Review of Systems Constitutional: Negative for other unusual diaphoresis, sweats, appetite or weight changes HENT: Negative for other worsening hearing loss, ear pain, facial swelling, mouth sores or neck stiffness.   Eyes: Negative for other worsening pain, redness or other visual disturbance.  Respiratory: Negative for other stridor or swelling Cardiovascular: Negative for other palpitations or other chest pain  Gastrointestinal: Negative for worsening diarrhea or loose stools, blood in stool, distention or other pain Genitourinary: Negative for hematuria, flank pain or other change in urine volume.  Musculoskeletal: Negative for myalgias or other joint swelling.  Skin: Negative for other color change, or other wound or worsening drainage.  Neurological: Negative for other syncope or numbness. Hematological: Negative for other adenopathy or swelling Psychiatric/Behavioral: Negative for hallucinations, other worsening agitation, SI, self-injury, or new decreased concentration All otherwise neg per pt     Objective:   Physical Exam BP 110/74   Pulse 78   Temp 98.1 F (36.7 C)   Resp 12   Ht 5' 6.75" (1.695 m)   Wt 217 lb 12.8 oz (98.8 kg)   SpO2 98%   BMI 34.37 kg/m  VS noted,  Constitutional: Pt is oriented to person, place, and time. Appears well-developed and well-nourished, in no significant distress and comfortable Head: Normocephalic and atraumatic  Eyes: Conjunctivae and EOM are normal. Pupils are equal, round, and reactive to light Right Ear: External ear normal  without discharge Left Ear: External ear normal without discharge Nose: Nose without discharge or deformity Mouth/Throat: Oropharynx is without other ulcerations and moist  Neck: Normal range of motion. Neck supple. No JVD present. No tracheal deviation present or significant neck LA or mass Cardiovascular: Normal rate, regular rhythm, normal heart sounds and intact distal pulses.   Pulmonary/Chest: WOB normal and breath sounds without rales or wheezing  Abdominal: Soft. Bowel sounds are normal. Mild rlq tender, No HSM  Musculoskeletal: Normal range of motion. Exhibits no edema Lymphadenopathy: Has no other cervical adenopathy.  Neurological: Pt is alert and oriented to person, place, and time. Pt has normal reflexes. No cranial nerve deficit. Motor grossly intact, Gait intact Skin: Skin is warm and dry. No rash noted or new ulcerations Psychiatric:  Has normal mood and affect. Behavior is  normal without agitation All otherwise neg per pt Lab Results  Component Value Date   WBC 5.4 05/11/2019   HGB 14.7 05/11/2019   HCT 44.3 05/11/2019   PLT 237.0 05/11/2019   GLUCOSE 95 05/11/2019   CHOL 143 05/11/2019   TRIG 81.0 05/11/2019   HDL 51.00 05/11/2019   LDLDIRECT 89.0 04/20/2019   LDLCALC 76 05/11/2019   ALT 42 05/11/2019   AST 26 05/11/2019   NA 139 05/11/2019   K 4.4 05/11/2019   CL 106 05/11/2019   CREATININE 0.95 05/11/2019   BUN 11 05/11/2019   CO2 27 05/11/2019   TSH 0.99 05/11/2019   PSA 0.56 05/11/2019   HGBA1C 6.0 05/11/2019      Assessment & Plan:

## 2019-05-11 NOTE — Telephone Encounter (Signed)
Spoke to patient who wanted to be seen earlier. The patient is now scheduled to see Anthony [name] on 1/12 at 3:30 pm.

## 2019-05-11 NOTE — Telephone Encounter (Signed)
Patient made appointment with Dr. Loletha Carrow on 1/26- he was referred for dark stools, constipation and epigastric pain. He states that he thinks he may have an ulcer and wants to know if it would be okay to wait until 1/26 to be seen. I offered him an appointment with a APP but he states he only wants to see Dr. Loletha Carrow and asked to speak with his nurse.

## 2019-05-11 NOTE — Patient Instructions (Addendum)
Please take all new medication as prescribed - the protonix, miralax and colace  Please continue all other medications as before, and refills have been done if requested.  Please have the pharmacy call with any other refills you may need.  Please continue your efforts at being more active, low cholesterol diet, and weight control.  You are otherwise up to date with prevention measures today.  Please keep your appointments with your specialists as you may have planned  You will be contacted regarding the referral for: Gastroenterology  Please go to the LAB at the blood drawing area for the tests to be done  You will be contacted by phone if any changes need to be made immediately.  Otherwise, you will receive a letter about your results with an explanation, but please check with MyChart first.  Please remember to sign up for MyChart if you have not done so, as this will be important to you in the future with finding out test results, communicating by private email, and scheduling acute appointments online when needed.  Please return in 6 months, or sooner if needed

## 2019-05-12 ENCOUNTER — Ambulatory Visit: Payer: Federal, State, Local not specified - PPO | Admitting: Internal Medicine

## 2019-05-14 ENCOUNTER — Encounter: Payer: Self-pay | Admitting: Internal Medicine

## 2019-05-14 NOTE — Assessment & Plan Note (Signed)
For miralax and colace asd,  to f/u any worsening symptoms or concerns

## 2019-05-14 NOTE — Assessment & Plan Note (Signed)
For f/u cbc,  to f/u any worsening symptoms or concerns 

## 2019-05-14 NOTE — Assessment & Plan Note (Signed)

## 2019-05-14 NOTE — Assessment & Plan Note (Addendum)
Etiology unclear, for protonix, refer GI  In addition to the time spent performing CPE, I spent an additional 25 minutes face to face,in which greater than 50% of this time was spent in counseling and coordination of care for patient's acute illness as documented, including the differential dx, treatment, further evaluation and other management of epigastric pain, dark stools, constipation, rlq pain

## 2019-05-16 ENCOUNTER — Ambulatory Visit: Payer: Federal, State, Local not specified - PPO | Admitting: Gastroenterology

## 2019-05-16 ENCOUNTER — Encounter: Payer: Self-pay | Admitting: Gastroenterology

## 2019-05-16 VITALS — BP 136/64 | HR 80 | Temp 97.8°F | Ht 66.75 in | Wt 217.5 lb

## 2019-05-16 DIAGNOSIS — K5909 Other constipation: Secondary | ICD-10-CM | POA: Diagnosis not present

## 2019-05-16 DIAGNOSIS — R109 Unspecified abdominal pain: Secondary | ICD-10-CM | POA: Diagnosis not present

## 2019-05-16 DIAGNOSIS — R195 Other fecal abnormalities: Secondary | ICD-10-CM

## 2019-05-16 NOTE — Progress Notes (Signed)
05/16/2019 TSHAWN VOLBRECHT [id number] [birth year]   HISTORY OF PRESENT ILLNESS:  This is a 54 year old male who is a patient of Dr. Corena Pilgrim.  He is here today with complaints of abdominal pain in multiple areas.  Reports RUQ and LUQ abdominal pain (around his rib cage), but also reports RLQ abdominal pain as well.  He also reports dark stools after Christmas, but then admits that he had been eating a ton of greens.  Hgb is normal at 14.7 grams on 1/7.  All other extensive labs on that same date are also normal.  He remains on ASA 81 mg daily, but denies any other NSAID use.  Remains on pantoprazole 40 mg daily.  Stools back to normal color, but now he says that he has been constipated recently.  He was seen by Dr. Loletha Carrow in 03/2018 for complaints of RUQ abdominal pain, but Dr. Loletha Carrow suspected that it was actually chest wall pain/costochondritis and was referred back to his PCP.  He had colonoscopy in 04/2010 with Dr. Olevia Perches at which time he was found to have small internal hemorrhoids and a small sessile polyp removed and was a tubular adenoma on pathology.  Last EGD was in 03/2015 Dr. Shary Key at which time he was found to have a 1 cm clean-based DU and duodenitis thought to be from ASA and NSAIDs.  Colonoscopy 04/2015 also by Dr. Shary Key revealed small internal hemorrhoids only.--These results are under Surgery tab.  Gastric Mucosal Biopsy from 03/2015: -  Antral and body type mucosa with focal chronic and active inflammation. -  An immunohistochemical stain for H.pylori is negative with satisfactory positive control.  Past Medical History:  Diagnosis Date  . Anemia   . Anxiety   . Asthma   . Cataract   . Depression   . DVT (deep venous thrombosis) ([place])   . Fatty liver   . Genital herpes 05/06/2018  . H/O renal calculi 2006  . Hyperlipidemia   . Internal hemorrhoid   . Kidney stones   . PE (pulmonary embolism)   . Peptic ulcer disease   . Pneumonia   . Spondylosis   . Tubular  adenoma of colon 2011   Dr Olevia Perches   Past Surgical History:  Procedure Laterality Date  . CATARACT EXTRACTION Left 1996   Dr Elonda Husky; [facility]  . colonoscopy with polypectomy  2011   Dr Olevia Perches, hemorrhoids  . CYSTOSCOPY W/ STONE MANIPULATION  2006  . INGUINAL HERNIA REPAIR Right 1990  . SHOULDER SURGERY Right 12/17/2010   Dr French Ana ; shoulder impingement & torn tendon    reports that he has never smoked. He has never used smokeless tobacco. He reports current alcohol use. He reports that he does not use drugs. family history includes Cancer in his paternal grandmother; Diabetes in his father; Glaucoma in his father; Heart attack in his maternal uncle; Heart attack (age of onset: 44) in his mother; Heart attack (age of onset: 16) in his maternal grandmother; Stroke (age of onset: 63) in his father. Allergies  Allergen Reactions  . Aleve [Naproxen] Rash    Patient stated it was years ago  . Nsaids Other (See Comments)    Bleeding ulcers      Outpatient Encounter Medications as of 05/16/2019  Medication Sig  . albuterol (PROVENTIL HFA;VENTOLIN HFA) 108 (90 Base) MCG/ACT inhaler Inhale 2 puffs into the lungs every 6 (six) hours as needed. For shortness of breath  . aspirin EC 81 MG tablet Take  81 mg by mouth daily.  Marland Kitchen atorvastatin (LIPITOR) 10 MG tablet Take 1 tablet (10 mg total) by mouth daily.  . Brinzolamide-Brimonidine 1-0.2 % SUSP Place 1 drop into both eyes 2 (two) times daily.  . diclofenac sodium (VOLTAREN) 1 % GEL Apply 4 g topically 4 (four) times daily as needed.  . docusate sodium (COLACE) 100 MG capsule Take 1 capsule (100 mg total) by mouth 2 (two) times daily.  . fluticasone (FLONASE) 50 MCG/ACT nasal spray Place 2 sprays into both nostrils daily as needed for allergies.  . pantoprazole (PROTONIX) 40 MG tablet Take 1 tablet (40 mg total) by mouth daily.  Marland Kitchen PARoxetine (PAXIL) 10 MG tablet Take 10 mg by mouth daily.  . polyethylene glycol powder (GLYCOLAX/MIRALAX) 17 GM/SCOOP  powder Take 17 g by mouth 2 (two) times daily as needed.  . rizatriptan (MAXALT-MLT) 10 MG disintegrating tablet Take 1 tablet (10 mg total) by mouth as needed for migraine. May repeat in 2 hours if needed  . SYMBICORT 160-4.5 MCG/ACT inhaler Inhale 2 puffs into the lungs daily as needed (shortness of breath).   . valACYclovir (VALTREX) 1000 MG tablet TAKE 1/2 TABLET BY MOUTH TWICE DAILY FOR 3 DAYS OR 1 TABLET BY MOUTH EVERY DAY FOR 5 DAYS AT ONSET OF BREAKOUT   No facility-administered encounter medications on file as of 05/16/2019.    REVIEW OF SYSTEMS  : All other systems reviewed and negative except where noted in the History of Present Illness.  PHYSICAL EXAM: BP 136/64   Pulse 80   Temp 97.8 F (36.6 C)   Ht 5' 6.75" (1.695 m)   Wt 217 lb 8 oz (98.7 kg)   BMI 34.32 kg/m  General: Well developed AA male in no acute distress Head: Normocephalic and atraumatic Eyes:  Sclerae anicteric, conjunctiva pink. Ears: Normal auditory acuity Lungs: Clear throughout to auscultation; no increased WOB Heart: Regular rate and rhythm; no M/R/G. Abdomen: Soft, non-distended. Normal bowel sounds.  Mild diffuse TTP. Musculoskeletal: Symmetrical with no gross deformities  Skin: No lesions on visible extremities Extremities: No edema  Neurological: Alert oriented x 4, grossly non-focal Psychological:  Alert and cooperative. Normal mood and affect  ASSESSMENT AND PLAN: *Generalized abdominal pain:  Describes pain in multiple areas.  I think that the best way to evaluate all of this at once would be to start with a CT scan of the abdomen and pelvis with contrast. *Constipation:  Just recent issue.  Will start Miralax daily for now.   *Dark stools:  Had been eating a lot of greens during the time that they were dark.  Hgb is normal and now they have been normal in color again for a while.  Had history of DU, but remains off of NSAID's and is still on pantoprazole 40 mg daily.  CC:  Biagio Borg,  MD

## 2019-05-16 NOTE — Patient Instructions (Addendum)
Continue pantoprazole daily.  Please purchase the following medications over the counter and take as directed: Miralax 17 grams 1-2 times daily  You have been scheduled for a CT scan of the abdomen and pelvis at [facility] ([address]---this is in the same building as [employer]).   You are scheduled on Friday at 10:00 am. You should arrive 15 minutes prior to your appointment time for registration. Please follow the written instructions below on the day of your exam:  WARNING: IF YOU ARE ALLERGIC TO IODINE/X-RAY DYE, PLEASE NOTIFY RADIOLOGY IMMEDIATELY AT [phone]! YOU WILL BE GIVEN A 13 HOUR PREMEDICATION PREP.  1) Do not eat or drink anything after 6:00 am (4 hours prior to your test) 2) You have been given 2 bottles of oral contrast to drink. The solution may taste better if refrigerated, but do NOT add ice or any other liquid to this solution. Shake well before drinking.    Drink 1 bottle of contrast @ 8:00 am (2 hours prior to your exam)  Drink 1 bottle of contrast @ 9:00 am (1 hour prior to your exam)  You may take any medications as prescribed with a small amount of water, if necessary. If you take any of the following medications: METFORMIN, GLUCOPHAGE, GLUCOVANCE, AVANDAMET, RIOMET, FORTAMET, [place] MET, JANUMET, GLUMETZA or METAGLIP, you MAY be asked to HOLD this medication 48 hours AFTER the exam.  The purpose of you drinking the oral contrast is to aid in the visualization of your intestinal tract. The contrast solution may cause some diarrhea. Depending on your individual set of symptoms, you may also receive an intravenous injection of x-ray contrast/dye. Plan on being at [facility] for 30 minutes or longer, depending on the type of exam you are having performed.  This test typically takes 30-45 minutes to complete.  If you have any questions regarding your exam or if you need to reschedule, you may call the CT department at  [phone] between the hours of 8:00 am and 5:00 pm, Monday-Friday.  ________________________________________________________________________

## 2019-05-17 ENCOUNTER — Other Ambulatory Visit: Payer: Self-pay

## 2019-05-17 ENCOUNTER — Encounter: Payer: Self-pay | Admitting: Internal Medicine

## 2019-05-17 ENCOUNTER — Ambulatory Visit: Payer: Federal, State, Local not specified - PPO | Admitting: Internal Medicine

## 2019-05-17 ENCOUNTER — Ambulatory Visit (INDEPENDENT_AMBULATORY_CARE_PROVIDER_SITE_OTHER): Payer: Federal, State, Local not specified - PPO

## 2019-05-17 VITALS — BP 112/72 | HR 101 | Temp 97.9°F | Ht 66.75 in | Wt 219.2 lb

## 2019-05-17 DIAGNOSIS — J4530 Mild persistent asthma, uncomplicated: Secondary | ICD-10-CM

## 2019-05-17 DIAGNOSIS — G4733 Obstructive sleep apnea (adult) (pediatric): Secondary | ICD-10-CM

## 2019-05-17 DIAGNOSIS — R0789 Other chest pain: Secondary | ICD-10-CM

## 2019-05-17 DIAGNOSIS — R079 Chest pain, unspecified: Secondary | ICD-10-CM | POA: Diagnosis not present

## 2019-05-17 NOTE — Assessment & Plan Note (Signed)
He would like to be referred for mask fitting

## 2019-05-17 NOTE — Progress Notes (Signed)
HPI male never smoker, [employer], followed for allergic rhinitis, asthma, complicated by history PE/DVT 2016, peptic ulcer, depression ([facility]), glaucoma, OSA ([facility] manages) Allergy Profile 09/25/2011-total IgE 159.6 with multiple specific elevations especially for grass pollens, molds, tree pollens and ragweed. PFT: 10/08/2011-mild obstructive airways disease-small airways, insignificant response to bronchodilator Hypercoag panel 04/26/15- incr Protein C,/ managed by Elna Breslow, [place] He brings [facility] records indicating wheezing and bronchospasm "probably secondary to pneumonia" in 1988 when he was in service in [place]. He was diagnosed with asthma and bronchopneumonia at that time. He says he had no asthma before that pneumonia and he denies family history of asthma. He requests a letter supporting his argument that asthma developed as a consequence of pneumonia which occurred while he was in [employer] service.  ---------------------------------------------------------------------------------------  02/10/2018- 54 year old male never smoker followed for allergic rhinitis, asthma, complicated by history PE/DVT 2016, peptic ulcer, depression ([facility]), glaucoma, OSA ([facility]) -----Follows for: asthma, had some flare ups but albuterol helped, still using symbicort Symbicort 160, albuterol HFA, Flonase Daily Symbicort.  Uses rescue inhaler about 1 time a week.  No recent exacerbation. [facility] but with no recent attention and he has dropped off CPAP. I encouraged him to speak with his [facility] primary physician about this.  With their referral, we could reassess and manage this problem if desired.  He is aware of snoring and at least occasional daytime sleepiness.  05/17/19- 54 year old male never smoker, [employer],  followed for allergic rhinitis, asthma, complicated by history PE/DVT 2016, peptic ulcer, depression ([facility]), Glaucoma, OSA ([facility] manages) Symbicort 160, albuterol HFA,  Flonase For ? Weeks, has felt sore tightness at xiphoid and bilateral abdomen. Not wheezing or coughing. Satisfied with current asthma meds, provided through [place]. GI is planning CT abdomen [facility] provides [facility]AP through his PCP there, but he doesn't have a connection for refitting CPAP mask, which is uncomfortable. CXR 07/27/16- Mild basilar subsegmental atelectasis .  Exam otherwise negative.  ROS-see HPI + = positive Constitutional:   No-   weight loss, night sweats, fevers, chills, fatigue, lassitude. HEENT:   No-  headaches, difficulty swallowing, tooth/dental problems, sore throat,    no-sneezing, no-itching, ear ache, no- nasal congestion, post nasal drip,  CV:  +chest pain, No- orthopnea, PND, swelling in lower extremities, anasarca, dizziness, palpitations Resp: +shortness of breath with exertion or at rest.              No-  productive cough,  No non-productive cough,  No- coughing up of blood.                change in color of mucus.  No- wheezing.   Skin:  GI:  No-   heartburn, indigestion, abdominal pain, nausea, vomiting, GU:, MS:  No-   joint pain or swelling.   Neuro-     nothing unusual Psych:  No- change in mood or affect. No depression or anxiety.  No memory loss.  OBJ- Physical Exam General- Alert, Oriented, Affect-appropriate, Distress- none acute Skin- + mild hyperkeratosis on anterior chest Lymphadenopathy- none Head- atraumatic            Eyes- Gross vision intact, PERRLA, conjunctivae and secretions clear            Ears- Hearing, canals-normal            Nose-no-sniffing, no-Septal dev, polyps, erosion, perforation             Throat- Mallampati III , mucosa  clear , drainage- none, tonsils- atrophic Neck- flexible , trachea midline, no stridor , thyroid nl, carotid no bruit Chest - symmetrical excursion , unlabored           Heart/CV- RRR , no murmur , no gallop  , no rub, nl s1 s2                           - JVD- none , edema- none, stasis changes- none,  varices- none           Lung- clear to P&A, wheeze-none, cough- none , dullness-none, rub- none           Chest wall-  Abd-  Br/ Gen/ Rectal- Not done, not indicated Extrem- cyanosis- none, clubbing, none, atrophy- none, strength- nl, negative Homan's Neuro- grossly intact to observation

## 2019-05-17 NOTE — Assessment & Plan Note (Signed)
Vague, subacute anterior chest wall and abdominal soreness is non-specifc and likely not part of his uncomplicated asthma. Plan- continue routine meds, CXR.

## 2019-05-17 NOTE — Assessment & Plan Note (Signed)
Mostly xiphoid and bilateral abdominal discomfort He will go ahead with CT planned by GI. We can do CXR while here.

## 2019-05-17 NOTE — Patient Instructions (Addendum)
Order- CXR    Dx asthma moderate, atypical anterior chest pain  Order- schedule CPAP mask fitting with [facility] to continue current meds  [facility] flow meter if available  Please call if we can help

## 2019-05-18 ENCOUNTER — Telehealth: Payer: Self-pay | Admitting: Internal Medicine

## 2019-05-18 NOTE — Telephone Encounter (Signed)
Baird Lyons D, MD  05/18/2019 10:05 AM EST    CXR- within normal limits. No cause for discomfort identified.   Called and spoke with pt letting him know the results of the cxr. Pt verbalized understanding. Nothing further needed.

## 2019-05-19 ENCOUNTER — Ambulatory Visit (INDEPENDENT_AMBULATORY_CARE_PROVIDER_SITE_OTHER)
Admission: RE | Admit: 2019-05-19 | Discharge: 2019-05-19 | Disposition: A | Discharge status: Home / Self Care | Payer: Federal, State, Local not specified - PPO | Source: Ambulatory Visit | Attending: Gastroenterology | Admitting: Gastroenterology

## 2019-05-19 ENCOUNTER — Other Ambulatory Visit: Payer: Self-pay

## 2019-05-19 DIAGNOSIS — R109 Unspecified abdominal pain: Secondary | ICD-10-CM

## 2019-05-19 MED ORDER — IOHEXOL 300 MG/ML  SOLN
100.0000 mL | Freq: Once | INTRAMUSCULAR | Status: AC | PRN
  Administered 2019-05-19: 100 mL via INTRAVENOUS

## 2019-05-22 NOTE — Telephone Encounter (Signed)
Mychart message sent by pt questioning instructions for his symbicort inhaler. I know that the symbicort instructions is two puffs twice a day but when looking at the instructions shown for pt's symbicort is showing two puffs daily prn.  Dr. Annamaria Boots, please advise if this is a mistype with pt's instructions and if we need to get this changed or if this is how you are wanting pt to take the symbicort?  Allergies  Allergen Reactions  . Aleve [Naproxen] Rash    Patient stated it was years ago  . Nsaids Other (See Comments)    Bleeding ulcers     Current Outpatient Medications:  .  albuterol (PROVENTIL HFA;VENTOLIN HFA) 108 (90 Base) MCG/ACT inhaler, Inhale 2 puffs into the lungs every 6 (six) hours as needed. For shortness of breath, Disp: 1 Inhaler, Rfl: 1 .  aspirin EC 81 MG tablet, Take 81 mg by mouth daily., Disp: , Rfl:  .  atorvastatin (LIPITOR) 10 MG tablet, Take 1 tablet (10 mg total) by mouth daily., Disp: 90 tablet, Rfl: 3 .  Brinzolamide-Brimonidine 1-0.2 % SUSP, Place 1 drop into both eyes 2 (two) times daily., Disp: , Rfl:  .  diclofenac sodium (VOLTAREN) 1 % GEL, Apply 4 g topically 4 (four) times daily as needed., Disp: 400 g, Rfl: 2 .  docusate sodium (COLACE) 100 MG capsule, Take 1 capsule (100 mg total) by mouth 2 (two) times daily., Disp: 60 capsule, Rfl: 0 .  fluticasone (FLONASE) 50 MCG/ACT nasal spray, Place 2 sprays into both nostrils daily as needed for allergies., Disp: 16 g, Rfl: 3 .  pantoprazole (PROTONIX) 40 MG tablet, Take 1 tablet (40 mg total) by mouth daily., Disp: 90 tablet, Rfl: 3 .  PARoxetine (PAXIL) 10 MG tablet, Take 10 mg by mouth daily., Disp: , Rfl:  .  polyethylene glycol powder (GLYCOLAX/MIRALAX) 17 GM/SCOOP powder, Take 17 g by mouth 2 (two) times daily as needed., Disp: 3350 g, Rfl: 1 .  rizatriptan (MAXALT-MLT) 10 MG disintegrating tablet, Take 1 tablet (10 mg total) by mouth as needed for migraine. May repeat in 2 hours if needed, Disp: 10 tablet,  Rfl: 2 .  SYMBICORT 160-4.5 MCG/ACT inhaler, Inhale 2 puffs into the lungs daily as needed (shortness of breath). , Disp: , Rfl:  .  valACYclovir (VALTREX) 1000 MG tablet, TAKE 1/2 TABLET BY MOUTH TWICE DAILY FOR 3 DAYS OR 1 TABLET BY MOUTH EVERY DAY FOR 5 DAYS AT ONSET OF BREAKOUT, Disp: 30 tablet, Rfl: 0

## 2019-05-22 NOTE — Telephone Encounter (Signed)
Thank you for correcting Symbicort instructions to read 2 puffs, then rinse mouth, twice daiy

## 2019-05-25 ENCOUNTER — Encounter: Payer: Self-pay | Admitting: Gastroenterology

## 2019-05-25 NOTE — Progress Notes (Signed)
____________________________________________________________  Attending physician addendum:  Thank you for sending this case to me. I have reviewed the entire note, and the outlined plan seems appropriate.  CT scan reasonable for pain complaints.  Possibility of musculoskeletal pain as before.  Wilfrid Lund, MD  ____________________________________________________________

## 2019-05-30 ENCOUNTER — Ambulatory Visit: Payer: Federal, State, Local not specified - PPO | Admitting: Gastroenterology

## 2019-06-01 DIAGNOSIS — M546 Pain in thoracic spine: Secondary | ICD-10-CM | POA: Diagnosis not present

## 2019-06-01 DIAGNOSIS — M545 Low back pain: Secondary | ICD-10-CM | POA: Diagnosis not present

## 2019-06-01 DIAGNOSIS — G894 Chronic pain syndrome: Secondary | ICD-10-CM | POA: Diagnosis not present

## 2019-06-01 DIAGNOSIS — M47816 Spondylosis without myelopathy or radiculopathy, lumbar region: Secondary | ICD-10-CM | POA: Diagnosis not present

## 2019-06-01 DIAGNOSIS — M5106 Intervertebral disc disorders with myelopathy, lumbar region: Secondary | ICD-10-CM | POA: Diagnosis not present

## 2019-06-01 DIAGNOSIS — M791 Myalgia, unspecified site: Secondary | ICD-10-CM | POA: Diagnosis not present

## 2019-06-05 DIAGNOSIS — G43909 Migraine, unspecified, not intractable, without status migrainosus: Secondary | ICD-10-CM | POA: Diagnosis not present

## 2019-06-20 ENCOUNTER — Encounter: Payer: Federal, State, Local not specified - PPO | Admitting: Neurology

## 2019-06-20 ENCOUNTER — Other Ambulatory Visit: Payer: Self-pay

## 2019-06-27 DIAGNOSIS — S53401A Unspecified sprain of right elbow, initial encounter: Secondary | ICD-10-CM | POA: Diagnosis not present

## 2019-07-04 ENCOUNTER — Encounter: Payer: Federal, State, Local not specified - PPO | Admitting: Neurology

## 2019-07-04 ENCOUNTER — Other Ambulatory Visit: Payer: Self-pay

## 2019-07-04 ENCOUNTER — Ambulatory Visit (INDEPENDENT_AMBULATORY_CARE_PROVIDER_SITE_OTHER): Payer: Federal, State, Local not specified - PPO | Admitting: Neurology

## 2019-07-04 DIAGNOSIS — R200 Anesthesia of skin: Secondary | ICD-10-CM | POA: Diagnosis not present

## 2019-07-04 DIAGNOSIS — M545 Low back pain, unspecified: Secondary | ICD-10-CM

## 2019-07-04 DIAGNOSIS — Z0289 Encounter for other administrative examinations: Secondary | ICD-10-CM

## 2019-07-04 DIAGNOSIS — G8929 Other chronic pain: Secondary | ICD-10-CM

## 2019-07-04 NOTE — Progress Notes (Signed)
Full Name: Anthony [name] Gender: Male MRN #: [id number] Date of Birth: 20-May-1965    Visit Date: 07/04/2019 07:19 Age: 54 Years Examining Physician: Arlice Colt, MD  Referring Physician: Cathlean Cower, MD     History: Anthony [name] is a 54 year old man with intermittent numbness in the left thigh that increases when he lays down.  Additionally he gets cramps in the left foot at times.  He has mild back pain.  On exam, strength, sensation and reflexes were normal and symmetric.  Nerve conduction studies: Bilateral peroneal and left tibial motor responses had normal distal latencies, amplitudes and conduction velocities.  The right tibial motor response was mildly reduced in amplitude with normal latency and velocity.  Bilateral tibial F-wave latencies were normal.  Bilateral superficial peroneal and sural sensory responses had normal peak latencies and amplitudes.  Galvanic skin responses were normal in the foot  Electromyography: Needle EMG of selected muscles of the left leg was performed.  The tibialis anterior muscle had a few polyphasic motor units but recruitment was normal.  Other muscles tested had normal motor unit action potentials and recruitment.  There was no abnormal spontaneous activity.  Impression: This is a normal NCV/EMG study of the legs and EMG study of the left leg.  There is no evidence of a neuropathy or significant radiculopathy.  Author [place] A. Felecia Shelling, MD, PhD, FAAN Certified in Neurology, Clinical Neurophysiology, Sleep Medicine, Pain Medicine and Neuroimaging Director, [place] [facility] [address] [place], [place] [address] [phone]          [facility]    Nerve / Sites Muscle Latency Ref. Amplitude Ref. Rel Amp Segments Distance Velocity Ref. Area    ms ms mV mV %  cm m/s m/s mVms  L Peroneal - EDB     Ankle EDB 4.2 ?6.5 5.3 ?2.0 100 Ankle - EDB 9   17.1     Fib head EDB  11.5  5.0  95 Fib head - Ankle 32 44 ?44 16.8     Pop fossa EDB 13.8  4.4  87.9 Pop fossa - Fib head 10 44 ?44 15.7         Pop fossa - Ankle      R Peroneal - EDB     Ankle EDB 4.4 ?6.5 5.3 ?2.0 100 Ankle - EDB 9   16.8     Fib head EDB 10.7  5.0  93.3 Fib head - Ankle 31 49 ?44 16.2     Pop fossa EDB 12.8  4.9  98 Pop fossa - Fib head 10 48 ?44 16.1         Pop fossa - Ankle      L Tibial - AH     Ankle AH 3.9 ?5.8 7.6 ?4.0 100 Ankle - AH 9   18.2     Pop fossa AH 13.3  6.9  91.3 Pop fossa - Ankle 40 43 ?41 19.5  R Tibial - AH     Ankle AH 3.4 ?5.8 2.9 ?4.0 100 Ankle - AH 9   9.1     Pop fossa AH 13.4  2.5  85.3 Pop fossa - Ankle 41 41 ?41 8.0             SSR      SNC    Nerve / Sites Rec. Site Peak Lat Ref.  Amp Ref. Segments Distance    ms  ms V V  cm  L Sural - Ankle (Calf)     Calf Ankle 3.8 ?4.4 21 ?6 Calf - Ankle 14  R Sural - Ankle (Calf)     Calf Ankle 3.8 ?4.4 19 ?6 Calf - Ankle 14  L Superficial peroneal - Ankle     Lat leg Ankle 3.9 ?4.4 8 ?6 Lat leg - Ankle 14  R Superficial peroneal - Ankle     Lat leg Ankle 3.8 ?4.4 16 ?6 Lat leg - Ankle 14             F  Wave    Nerve F Lat Ref.   ms ms  L Tibial - AH 51.9 ?56.0  R Tibial - AH 54.4 ?56.0         EMG Summary Table    Spontaneous MUAP Recruitment  Muscle IA Fib PSW Fasc Other Amp Dur. Poly Pattern  L. Vastus medialis Normal None None None _______ Normal Normal Normal Normal  L. Tibialis anterior Normal None None None _______ Normal Normal 1+ Normal  L. Peroneus longus Normal None None None _______ Normal Normal Normal Normal  L. Gastrocnemius (Medial head) Normal None None None _______ Normal Normal Normal Normal  L. Abductor hallucis Normal None None None _______ Normal Normal Normal Normal  L. Gluteus medius Normal None None None _______ Normal Normal Normal Normal  L. Iliopsoas Normal None None None _______ Normal Normal Normal Normal

## 2019-07-17 DIAGNOSIS — F329 Major depressive disorder, single episode, unspecified: Secondary | ICD-10-CM | POA: Diagnosis not present

## 2019-07-17 DIAGNOSIS — R519 Headache, unspecified: Secondary | ICD-10-CM | POA: Diagnosis not present

## 2019-07-17 DIAGNOSIS — F4310 Post-traumatic stress disorder, unspecified: Secondary | ICD-10-CM | POA: Diagnosis not present

## 2019-07-17 DIAGNOSIS — K279 Peptic ulcer, site unspecified, unspecified as acute or chronic, without hemorrhage or perforation: Secondary | ICD-10-CM | POA: Diagnosis not present

## 2019-07-18 ENCOUNTER — Encounter: Payer: Federal, State, Local not specified - PPO | Admitting: Neurology

## 2019-07-20 ENCOUNTER — Encounter: Payer: Self-pay | Admitting: Internal Medicine

## 2019-07-20 ENCOUNTER — Telehealth: Payer: Self-pay | Admitting: *Deleted

## 2019-07-20 NOTE — Telephone Encounter (Signed)
Took call from phone staff and spoke with pt. Relayed results per Dr. Felecia Shelling impression from EMG/NCS note: "This is a normal NCV/EMG study of the legs and EMG study of the left leg.  There is no evidence of a neuropathy or significant radiculopathy." Recommended he f/u with PCP (referring provider) on next steps. He verbalized understanding and appreciation.

## 2019-07-25 NOTE — Telephone Encounter (Signed)
Done

## 2019-08-03 DIAGNOSIS — M546 Pain in thoracic spine: Secondary | ICD-10-CM | POA: Diagnosis not present

## 2019-08-03 DIAGNOSIS — M7711 Lateral epicondylitis, right elbow: Secondary | ICD-10-CM | POA: Insufficient documentation

## 2019-08-03 DIAGNOSIS — M4306 Spondylolysis, lumbar region: Secondary | ICD-10-CM | POA: Diagnosis not present

## 2019-08-03 DIAGNOSIS — M5106 Intervertebral disc disorders with myelopathy, lumbar region: Secondary | ICD-10-CM | POA: Diagnosis not present

## 2019-08-03 DIAGNOSIS — G894 Chronic pain syndrome: Secondary | ICD-10-CM | POA: Diagnosis not present

## 2019-08-03 DIAGNOSIS — M25521 Pain in right elbow: Secondary | ICD-10-CM | POA: Insufficient documentation

## 2019-08-03 DIAGNOSIS — M545 Low back pain: Secondary | ICD-10-CM | POA: Diagnosis not present

## 2019-08-15 DIAGNOSIS — F4310 Post-traumatic stress disorder, unspecified: Secondary | ICD-10-CM | POA: Diagnosis not present

## 2019-08-15 DIAGNOSIS — F329 Major depressive disorder, single episode, unspecified: Secondary | ICD-10-CM | POA: Diagnosis not present

## 2019-08-17 ENCOUNTER — Encounter: Payer: Self-pay | Admitting: Internal Medicine

## 2019-08-18 ENCOUNTER — Telehealth: Payer: Self-pay

## 2019-08-18 NOTE — Telephone Encounter (Signed)
MD is out of the office pls advise../lmb 

## 2019-08-18 NOTE — Telephone Encounter (Signed)
New message    The patient getting the second dosage of the Pfizer vaccine today@ at 11:30.  Wanted to know can he take over the medication Tylenol before or after the vaccine shot.

## 2019-08-18 NOTE — Telephone Encounter (Signed)
Can take tylenol after shot if having symptoms. Would not recommend unless symptoms develop.

## 2019-08-18 NOTE — Telephone Encounter (Signed)
Notified pt w/MD response.../lmb 

## 2019-08-22 ENCOUNTER — Encounter: Payer: Self-pay | Admitting: Internal Medicine

## 2019-09-01 ENCOUNTER — Telehealth: Payer: Self-pay | Admitting: Internal Medicine

## 2019-09-01 DIAGNOSIS — Z8669 Personal history of other diseases of the nervous system and sense organs: Secondary | ICD-10-CM | POA: Diagnosis not present

## 2019-09-01 DIAGNOSIS — H2511 Age-related nuclear cataract, right eye: Secondary | ICD-10-CM | POA: Diagnosis not present

## 2019-09-01 DIAGNOSIS — H4032X 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, left eye, stage unspecified: Secondary | ICD-10-CM | POA: Diagnosis not present

## 2019-09-01 DIAGNOSIS — H527 Unspecified disorder of refraction: Secondary | ICD-10-CM | POA: Diagnosis not present

## 2019-09-01 NOTE — Telephone Encounter (Signed)
    Patient calling with concerns about possible DVT Patient states his left leg , calf area has cramps The right leg feels like " something moving around" inside.  Please call

## 2019-09-01 NOTE — Telephone Encounter (Signed)
New Message:   Pt is calling and states he needs discuss whats going on with his legs. He states he had DBT's before. Please advise.

## 2019-09-04 ENCOUNTER — Encounter: Payer: Self-pay | Admitting: Internal Medicine

## 2019-09-04 ENCOUNTER — Ambulatory Visit: Payer: Federal, State, Local not specified - PPO | Admitting: Internal Medicine

## 2019-09-04 ENCOUNTER — Other Ambulatory Visit: Payer: Self-pay

## 2019-09-04 VITALS — BP 130/96 | HR 75 | Temp 98.1°F | Ht 66.0 in | Wt 214.0 lb

## 2019-09-04 DIAGNOSIS — M47816 Spondylosis without myelopathy or radiculopathy, lumbar region: Secondary | ICD-10-CM | POA: Diagnosis not present

## 2019-09-04 DIAGNOSIS — R252 Cramp and spasm: Secondary | ICD-10-CM | POA: Diagnosis not present

## 2019-09-04 DIAGNOSIS — E785 Hyperlipidemia, unspecified: Secondary | ICD-10-CM | POA: Diagnosis not present

## 2019-09-04 DIAGNOSIS — G894 Chronic pain syndrome: Secondary | ICD-10-CM | POA: Diagnosis not present

## 2019-09-04 DIAGNOSIS — M4306 Spondylolysis, lumbar region: Secondary | ICD-10-CM | POA: Diagnosis not present

## 2019-09-04 DIAGNOSIS — R739 Hyperglycemia, unspecified: Secondary | ICD-10-CM | POA: Diagnosis not present

## 2019-09-04 MED ORDER — CYCLOBENZAPRINE HCL 5 MG PO TABS: 5.0000 mg | ORAL_TABLET | Freq: Three times a day (TID) | ORAL | 3 refills | Status: DC | PRN

## 2019-09-04 NOTE — Patient Instructions (Signed)
Please take all new medication as prescribed - the muscle relaxer as needed  Please continue all other medications as before, and refills have been done if requested.  Please have the pharmacy call with any other refills you may need.  Please continue your efforts at being more active, low cholesterol diet, and weight control.  Please keep your appointments with your specialists as you may have planned  Please go to the LAB at the blood drawing area for the tests to be done  You will be contacted by phone if any changes need to be made immediately.  Otherwise, you will receive a letter about your results with an explanation, but please check with MyChart first.  Please remember to sign up for MyChart if you have not done so, as this will be important to you in the future with finding out test results, communicating by private email, and scheduling acute appointments online when needed.  You will be contacted regarding the referral for: Dietary

## 2019-09-04 NOTE — Progress Notes (Signed)
Subjective:    Patient ID: Anthony [name], male    DOB: [birth year], 54 y.o.   MRN: [id number]  HPI  Here to f/u; overall doing ok,  Pt denies chest pain, increasing sob or doe, wheezing, orthopnea, PND, increased LE swelling, palpitations, dizziness or syncope.  Pt denies new neurological symptoms such as new headache, or facial or extremity weakness or numbness.  Pt denies polydipsia, polyuria, or low sugar episode.  Pt states overall good compliance with meds, mostly trying to follow appropriate diet, with wt overall stable,  but little exercise however.  ALso with nightly leg cramps in the past wk, concerned about possible dvt, no swelling Past Medical History:  Diagnosis Date  . Anemia   . Anxiety   . Asthma   . Cataract   . Depression   . DVT (deep venous thrombosis) ([place])   . Fatty liver   . Genital herpes 05/06/2018  . H/O renal calculi 2006  . Hyperlipidemia   . Internal hemorrhoid   . Kidney stones   . PE (pulmonary embolism)   . Peptic ulcer disease   . Pneumonia   . Spondylosis   . Tubular adenoma of colon 2011   Dr Olevia Perches   Past Surgical History:  Procedure Laterality Date  . CATARACT EXTRACTION Left 1996   Dr Elonda Husky; [facility]  . colonoscopy with polypectomy  2011   Dr Olevia Perches, hemorrhoids  . CYSTOSCOPY W/ STONE MANIPULATION  2006  . INGUINAL HERNIA REPAIR Right 1990  . SHOULDER SURGERY Right 12/17/2010   Dr French Ana ; shoulder impingement & torn tendon    reports that he has never smoked. He has never used smokeless tobacco. He reports current alcohol use. He reports that he does not use drugs. family history includes Cancer in his paternal grandmother; Diabetes in his father; Glaucoma in his father; Heart attack in his maternal uncle; Heart attack (age of onset: 29) in his mother; Heart attack (age of onset: 43) in his maternal grandmother; Stroke (age of onset: 58) in his father. Allergies  Allergen Reactions  . Aleve [Naproxen] Rash    Patient stated it was years ago   . Nsaids Other (See Comments)    Bleeding ulcers   Current Outpatient Medications on File Prior to Visit  Medication Sig Dispense Refill  . albuterol (PROVENTIL HFA;VENTOLIN HFA) 108 (90 Base) MCG/ACT inhaler Inhale 2 puffs into the lungs every 6 (six) hours as needed. For shortness of breath 1 Inhaler 1  . aspirin EC 81 MG tablet Take 81 mg by mouth daily.    Marland Kitchen atorvastatin (LIPITOR) 10 MG tablet Take 1 tablet (10 mg total) by mouth daily. 90 tablet 3  . baclofen (LIORESAL) 10 MG tablet     . Brinzolamide-Brimonidine 1-0.2 % SUSP Place 1 drop into both eyes 2 (two) times daily.    . diclofenac sodium (VOLTAREN) 1 % GEL Apply 4 g topically 4 (four) times daily as needed. 400 g 2  . docusate sodium (COLACE) 100 MG capsule Take 1 capsule (100 mg total) by mouth 2 (two) times daily. 60 capsule 0  . fluticasone (FLONASE) 50 MCG/ACT nasal spray Place 2 sprays into both nostrils daily as needed for allergies. 16 g 3  . metaxalone (SKELAXIN) 800 MG tablet Take 800 mg by mouth 3 (three) times daily as needed.    . methocarbamol (ROBAXIN) 500 MG tablet Take 500 mg by mouth 3 (three) times daily.    . pantoprazole (PROTONIX) 40 MG tablet Take 1  tablet (40 mg total) by mouth daily. 90 tablet 3  . PARoxetine (PAXIL) 10 MG tablet Take 10 mg by mouth daily.    . polyethylene glycol powder (GLYCOLAX/MIRALAX) 17 GM/SCOOP powder Take 17 g by mouth 2 (two) times daily as needed. 3350 g 1  . SYMBICORT 160-4.5 MCG/ACT inhaler Inhale 2 puffs into the lungs daily as needed (shortness of breath).     . valACYclovir (VALTREX) 1000 MG tablet TAKE 1/2 TABLET BY MOUTH TWICE DAILY FOR 3 DAYS OR 1 TABLET BY MOUTH EVERY DAY FOR 5 DAYS AT ONSET OF BREAKOUT 30 tablet 0   No current facility-administered medications on file prior to visit.   Review of Systems All otherwise neg per pt    Objective:   Physical Exam BP (!) 130/96 (BP Location: Left Arm, Patient Position: Sitting, Cuff Size: Large)   Pulse 75   Temp 98.1  F (36.7 C) (Oral)   Ht 5\' 6"  (1.676 m)   Wt 214 lb (97.1 kg)   SpO2 98%   BMI 34.54 kg/m  VS noted,  Constitutional: Pt appears in NAD HENT: Head: NCAT.  Right Ear: External ear normal.  Left Ear: External ear normal.  Eyes: . Pupils are equal, round, and reactive to light. Conjunctivae and EOM are normal Nose: without d/c or deformity Neck: Neck supple. Gross normal ROM Cardiovascular: Normal rate and regular rhythm.   Pulmonary/Chest: Effort normal and breath sounds without rales or wheezing.  Abd:  Soft, NT, ND, + BS, no organomegaly Neurological: Pt is alert. At baseline orientation, motor grossly intact Skin: Skin is warm. No rashes, other new lesions, no LE edema Psychiatric: Pt behavior is normal without agitation  All otherwise neg per pt Lab Results  Component Value Date   WBC 5.4 05/11/2019   HGB 14.7 05/11/2019   HCT 44.3 05/11/2019   PLT 237.0 05/11/2019   GLUCOSE 82 09/04/2019   CHOL 143 05/11/2019   TRIG 81.0 05/11/2019   HDL 51.00 05/11/2019   LDLDIRECT 89.0 04/20/2019   LDLCALC 76 05/11/2019   ALT 42 05/11/2019   AST 26 05/11/2019   NA 137 09/04/2019   K 4.2 09/04/2019   CL 102 09/04/2019   CREATININE 1.02 09/04/2019   BUN 11 09/04/2019   CO2 28 09/04/2019   TSH 0.99 05/11/2019   PSA 0.56 05/11/2019   HGBA1C 6.0 05/11/2019         Assessment & Plan:

## 2019-09-05 LAB — BASIC METABOLIC PANEL
BUN: 11 mg/dL (ref 6–23)
CO2: 28 mEq/L (ref 19–32)
Calcium: 9.4 mg/dL (ref 8.4–10.5)
Chloride: 102 mEq/L (ref 96–112)
Creatinine, Ser: 1.02 mg/dL (ref 0.40–1.50)
GFR: 92.58 mL/min (ref >60.00)
Glucose, Bld: 82 mg/dL (ref 70–99)
Potassium: 4.2 mEq/L (ref 3.5–5.1)
Sodium: 137 mEq/L (ref 135–145)

## 2019-09-05 LAB — MAGNESIUM: Magnesium: 2 mg/dL (ref 1.5–2.5)

## 2019-09-06 ENCOUNTER — Encounter: Payer: Self-pay | Admitting: Internal Medicine

## 2019-09-06 NOTE — Assessment & Plan Note (Addendum)
Very low suspicion for dvt, for labs as ordered, muscle relaxer prn  I spent 31 minutes in preparing to see the patient by review of recent labs, imaging and procedures, obtaining and reviewing separately obtained history, communicating with the patient and family or caregiver, ordering medications, tests or procedures, and documenting clinical information in the EHR including the differential Dx, treatment, and any further evaluation and other management of leg cramping, HLD, hyperglycemia

## 2019-09-06 NOTE — Assessment & Plan Note (Signed)
stable overall by history and exam, recent data reviewed with pt, and pt to continue medical treatment as before,  to f/u any worsening symptoms or concerns  

## 2019-09-20 DIAGNOSIS — M25561 Pain in right knee: Secondary | ICD-10-CM | POA: Diagnosis not present

## 2019-09-20 DIAGNOSIS — M4134 Thoracogenic scoliosis, thoracic region: Secondary | ICD-10-CM | POA: Diagnosis not present

## 2019-09-20 DIAGNOSIS — M25521 Pain in right elbow: Secondary | ICD-10-CM | POA: Diagnosis not present

## 2019-09-20 DIAGNOSIS — M4306 Spondylolysis, lumbar region: Secondary | ICD-10-CM | POA: Diagnosis not present

## 2019-10-10 DIAGNOSIS — H25011 Cortical age-related cataract, right eye: Secondary | ICD-10-CM | POA: Diagnosis not present

## 2019-10-10 DIAGNOSIS — Z961 Presence of intraocular lens: Secondary | ICD-10-CM | POA: Diagnosis not present

## 2019-10-10 DIAGNOSIS — H40023 Open angle with borderline findings, high risk, bilateral: Secondary | ICD-10-CM | POA: Diagnosis not present

## 2019-10-10 DIAGNOSIS — H2511 Age-related nuclear cataract, right eye: Secondary | ICD-10-CM | POA: Diagnosis not present

## 2019-10-10 DIAGNOSIS — H524 Presbyopia: Secondary | ICD-10-CM | POA: Diagnosis not present

## 2019-10-17 ENCOUNTER — Encounter: Payer: Self-pay | Admitting: Internal Medicine

## 2019-10-30 ENCOUNTER — Encounter: Payer: Self-pay | Admitting: Internal Medicine

## 2019-10-30 DIAGNOSIS — B009 Herpesviral infection, unspecified: Secondary | ICD-10-CM

## 2019-10-30 MED ORDER — VALACYCLOVIR HCL 1 G PO TABS: ORAL_TABLET | ORAL | 0 refills | Status: DC

## 2019-11-08 ENCOUNTER — Other Ambulatory Visit: Payer: Self-pay

## 2019-11-08 ENCOUNTER — Encounter: Payer: Self-pay | Admitting: Internal Medicine

## 2019-11-08 ENCOUNTER — Ambulatory Visit: Payer: Federal, State, Local not specified - PPO | Admitting: Internal Medicine

## 2019-11-08 VITALS — BP 118/70 | HR 91 | Temp 98.6°F | Ht 66.0 in | Wt 209.0 lb

## 2019-11-08 DIAGNOSIS — R739 Hyperglycemia, unspecified: Secondary | ICD-10-CM | POA: Diagnosis not present

## 2019-11-08 DIAGNOSIS — F419 Anxiety disorder, unspecified: Secondary | ICD-10-CM | POA: Diagnosis not present

## 2019-11-08 DIAGNOSIS — E785 Hyperlipidemia, unspecified: Secondary | ICD-10-CM | POA: Diagnosis not present

## 2019-11-08 NOTE — Assessment & Plan Note (Signed)
Pt now wants to try statin holiday and work on diet alone, for lab f/u today and next visit

## 2019-11-08 NOTE — Addendum Note (Signed)
Addended by: Cresenciano Lick on: 11/08/2019 10:06 AM   Modules accepted: Orders

## 2019-11-08 NOTE — Progress Notes (Signed)
Subjective:    Patient ID: Anthony [name], male    DOB: 07/30/1965, 54 y.o.   MRN: [id number]  HPI Here to f/u; overall doing ok,  Pt denies chest pain, increasing sob or doe, wheezing, orthopnea, PND, increased LE swelling, palpitations, dizziness or syncope.  Pt denies new neurological symptoms such as new headache, or facial or extremity weakness or numbness.  Pt denies polydipsia, polyuria, or low sugar episode.  Pt states overall good compliance with meds, mostly trying to follow appropriate diet, with wt overall stable,  but little exercise however. Lost 10 lbs with better diet, hoping to help LBP as well. Wt Readings from Last 3 Encounters:  11/08/19 209 lb (94.8 kg)  09/04/19 214 lb (97.1 kg)  05/17/19 219 lb 3.2 oz (99.4 kg)   Past Medical History:  Diagnosis Date  . Anemia   . Anxiety   . Asthma   . Cataract   . Depression   . DVT (deep venous thrombosis) ([place])   . Fatty liver   . Genital herpes 05/06/2018  . H/O renal calculi 2006  . Hyperlipidemia   . Internal hemorrhoid   . Kidney stones   . PE (pulmonary embolism)   . Peptic ulcer disease   . Pneumonia   . Spondylosis   . Tubular adenoma of colon 2011   Dr Olevia Perches   Past Surgical History:  Procedure Laterality Date  . CATARACT EXTRACTION Left 1996   Dr Elonda Husky; [facility]  . colonoscopy with polypectomy  2011   Dr Olevia Perches, hemorrhoids  . CYSTOSCOPY W/ STONE MANIPULATION  2006  . INGUINAL HERNIA REPAIR Right 1990  . SHOULDER SURGERY Right 12/17/2010   Dr French Ana ; shoulder impingement & torn tendon    reports that he has never smoked. He has never used smokeless tobacco. He reports current alcohol use. He reports that he does not use drugs. family history includes Cancer in his paternal grandmother; Diabetes in his father; Glaucoma in his father; Heart attack in his maternal uncle; Heart attack (age of onset: 19) in his mother; Heart attack (age of onset: 69) in his maternal grandmother; Stroke (age of onset: 31) in his  father. Allergies  Allergen Reactions  . Aleve [Naproxen] Rash    Patient stated it was years ago  . Nsaids Other (See Comments)    Bleeding ulcers   Current Outpatient Medications on File Prior to Visit  Medication Sig Dispense Refill  . albuterol (PROVENTIL HFA;VENTOLIN HFA) 108 (90 Base) MCG/ACT inhaler Inhale 2 puffs into the lungs every 6 (six) hours as needed. For shortness of breath 1 Inhaler 1  . aspirin EC 81 MG tablet Take 81 mg by mouth daily.    Marland Kitchen atorvastatin (LIPITOR) 10 MG tablet Take 1 tablet (10 mg total) by mouth daily. 90 tablet 3  . baclofen (LIORESAL) 10 MG tablet     . Brinzolamide-Brimonidine 1-0.2 % SUSP Place 1 drop into both eyes 2 (two) times daily.    . cyclobenzaprine (FLEXERIL) 5 MG tablet Take 1 tablet (5 mg total) by mouth 3 (three) times daily as needed for muscle spasms. 30 tablet 3  . diclofenac sodium (VOLTAREN) 1 % GEL Apply 4 g topically 4 (four) times daily as needed. 400 g 2  . docusate sodium (COLACE) 100 MG capsule Take 1 capsule (100 mg total) by mouth 2 (two) times daily. 60 capsule 0  . fluticasone (FLONASE) 50 MCG/ACT nasal spray Place 2 sprays into both nostrils daily as needed for allergies.  16 g 3  . metaxalone (SKELAXIN) 800 MG tablet Take 800 mg by mouth 3 (three) times daily as needed.    . methocarbamol (ROBAXIN) 500 MG tablet Take 500 mg by mouth 3 (three) times daily.    . pantoprazole (PROTONIX) 40 MG tablet Take 1 tablet (40 mg total) by mouth daily. 90 tablet 3  . PARoxetine (PAXIL) 10 MG tablet Take 10 mg by mouth daily.    . polyethylene glycol powder (GLYCOLAX/MIRALAX) 17 GM/SCOOP powder Take 17 g by mouth 2 (two) times daily as needed. 3350 g 1  . SYMBICORT 160-4.5 MCG/ACT inhaler Inhale 2 puffs into the lungs daily as needed (shortness of breath).     . topiramate (TOPAMAX) 25 MG tablet topiramate 25 mg tablet    . valACYclovir (VALTREX) 1000 MG tablet TAKE 1/2 TABLET BY MOUTH TWICE DAILY FOR 3 DAYS OR 1 TABLET BY MOUTH EVERY  DAY FOR 5 DAYS AT ONSET OF BREAKOUT 30 tablet 0   No current facility-administered medications on file prior to visit.   Review of Systems All otherwise neg per pt     Objective:   Physical Exam BP 118/70 (BP Location: Left Arm, Patient Position: Sitting, Cuff Size: Large)   Pulse 91   Temp 98.6 F (37 C) (Oral)   Ht 5\' 6"  (1.676 m)   Wt 209 lb (94.8 kg)   SpO2 96%   BMI 33.73 kg/m  VS noted,  Constitutional: Pt appears in NAD HENT: Head: NCAT.  Right Ear: External ear normal.  Left Ear: External ear normal.  Eyes: . Pupils are equal, round, and reactive to light. Conjunctivae and EOM are normal Nose: without d/c or deformity Neck: Neck supple. Gross normal ROM Cardiovascular: Normal rate and regular rhythm.   Pulmonary/Chest: Effort normal and breath sounds without rales or wheezing.  Abd:  Soft, NT, ND, + BS, no organomegaly Neurological: Pt is alert. At baseline orientation, motor grossly intact Skin: Skin is warm. No rashes, other new lesions, no LE edema Psychiatric: Pt behavior is normal without agitation  All otherwise neg per pt Lab Results  Component Value Date   WBC 5.4 05/11/2019   HGB 14.7 05/11/2019   HCT 44.3 05/11/2019   PLT 237.0 05/11/2019   GLUCOSE 82 09/04/2019   CHOL 143 05/11/2019   TRIG 81.0 05/11/2019   HDL 51.00 05/11/2019   LDLDIRECT 89.0 04/20/2019   LDLCALC 76 05/11/2019   ALT 42 05/11/2019   AST 26 05/11/2019   NA 137 09/04/2019   K 4.2 09/04/2019   CL 102 09/04/2019   CREATININE 1.02 09/04/2019   BUN 11 09/04/2019   CO2 28 09/04/2019   TSH 0.99 05/11/2019   PSA 0.56 05/11/2019   HGBA1C 6.0 05/11/2019         Assessment & Plan:

## 2019-11-08 NOTE — Patient Instructions (Signed)
Ok to hold on the statin for now  Please continue all other medications as before, and refills have been done if requested.  Please have the pharmacy call with any other refills you may need.  Please continue your efforts at being more active, low cholesterol diet, and weight control.  Please keep your appointments with your specialists as you may have planned  Please go to the LAB at the blood drawing area for the tests to be done  You will be contacted by phone if any changes need to be made immediately.  Otherwise, you will receive a letter about your results with an explanation, but please check with MyChart first.  Please remember to sign up for MyChart if you have not done so, as this will be important to you in the future with finding out test results, communicating by private email, and scheduling acute appointments online when needed.  Please make an Appointment to return in 6 months, or sooner if needed

## 2019-11-08 NOTE — Assessment & Plan Note (Signed)
stable overall by history and exam, recent data reviewed with pt, and pt to continue medical treatment as before,  to f/u any worsening symptoms or concerns  

## 2019-11-08 NOTE — Assessment & Plan Note (Addendum)

## 2019-11-09 ENCOUNTER — Encounter: Payer: Self-pay | Admitting: Internal Medicine

## 2019-11-09 LAB — BASIC METABOLIC PANEL
BUN: 9 mg/dL (ref 7–25)
CO2: 27 mmol/L (ref 20–32)
Calcium: 9.6 mg/dL (ref 8.6–10.3)
Chloride: 106 mmol/L (ref 98–110)
Creat: 0.91 mg/dL (ref 0.70–1.33)
Glucose, Bld: 91 mg/dL (ref 65–99)
Potassium: 4.4 mmol/L (ref 3.5–5.3)
Sodium: 139 mmol/L (ref 135–146)

## 2019-11-09 LAB — HEPATIC FUNCTION PANEL
AG Ratio: 1.7 (calc) (ref 1.0–2.5)
ALT: 27 U/L (ref 9–46)
AST: 20 U/L (ref 10–35)
Albumin: 4.3 g/dL (ref 3.6–5.1)
Alkaline phosphatase (APISO): 86 U/L (ref 35–144)
Bilirubin, Direct: 0.1 mg/dL (ref 0.0–0.2)
Globulin: 2.5 g/dL (calc) (ref 1.9–3.7)
Indirect Bilirubin: 0.4 mg/dL (calc) (ref 0.2–1.2)
Total Bilirubin: 0.5 mg/dL (ref 0.2–1.2)
Total Protein: 6.8 g/dL (ref 6.1–8.1)

## 2019-11-09 LAB — HEMOGLOBIN A1C
Hgb A1c MFr Bld: 5.7 % of total Hgb — ABNORMAL HIGH (ref <5.7)
Mean Plasma Glucose: 117 (calc)
eAG (mmol/L): 6.5 (calc)

## 2019-11-09 LAB — LIPID PANEL
Cholesterol: 140 mg/dL (ref <200)
HDL: 48 mg/dL (ref >=40)
LDL Cholesterol (Calc): 65 mg/dL (calc)
Non-HDL Cholesterol (Calc): 92 mg/dL (calc) (ref <130)
Total CHOL/HDL Ratio: 2.9 (calc) (ref <5.0)
Triglycerides: 203 mg/dL — ABNORMAL HIGH (ref <150)

## 2019-11-21 ENCOUNTER — Other Ambulatory Visit: Payer: Self-pay | Admitting: Internal Medicine

## 2019-11-21 DIAGNOSIS — B009 Herpesviral infection, unspecified: Secondary | ICD-10-CM

## 2019-11-23 DIAGNOSIS — M25373 Other instability, unspecified ankle: Secondary | ICD-10-CM | POA: Diagnosis not present

## 2019-12-06 ENCOUNTER — Other Ambulatory Visit: Payer: Self-pay | Admitting: Internal Medicine

## 2019-12-06 DIAGNOSIS — B009 Herpesviral infection, unspecified: Secondary | ICD-10-CM

## 2019-12-07 ENCOUNTER — Encounter: Payer: Self-pay | Admitting: Internal Medicine

## 2019-12-07 DIAGNOSIS — M25372 Other instability, left ankle: Secondary | ICD-10-CM | POA: Diagnosis not present

## 2019-12-07 DIAGNOSIS — M25371 Other instability, right ankle: Secondary | ICD-10-CM | POA: Diagnosis not present

## 2019-12-10 ENCOUNTER — Encounter: Payer: Self-pay | Admitting: Internal Medicine

## 2019-12-11 MED ORDER — DOXYCYCLINE HYCLATE 100 MG PO TABS: 100.0000 mg | ORAL_TABLET | Freq: Two times a day (BID) | ORAL | 0 refills | Status: DC

## 2019-12-13 DIAGNOSIS — R319 Hematuria, unspecified: Secondary | ICD-10-CM | POA: Diagnosis not present

## 2019-12-13 DIAGNOSIS — Z87438 Personal history of other diseases of male genital organs: Secondary | ICD-10-CM | POA: Diagnosis not present

## 2019-12-13 DIAGNOSIS — R300 Dysuria: Secondary | ICD-10-CM | POA: Diagnosis not present

## 2019-12-13 DIAGNOSIS — N529 Male erectile dysfunction, unspecified: Secondary | ICD-10-CM | POA: Diagnosis not present

## 2019-12-13 DIAGNOSIS — Z114 Encounter for screening for human immunodeficiency virus [HIV]: Secondary | ICD-10-CM | POA: Diagnosis not present

## 2019-12-21 ENCOUNTER — Ambulatory Visit: Payer: Federal, State, Local not specified - PPO | Admitting: Internal Medicine

## 2019-12-21 ENCOUNTER — Other Ambulatory Visit: Payer: Self-pay

## 2019-12-21 ENCOUNTER — Encounter: Payer: Self-pay | Admitting: Internal Medicine

## 2019-12-21 VITALS — BP 110/80 | HR 71 | Temp 98.4°F | Ht 66.0 in | Wt 209.0 lb

## 2019-12-21 DIAGNOSIS — F419 Anxiety disorder, unspecified: Secondary | ICD-10-CM

## 2019-12-21 DIAGNOSIS — M25371 Other instability, right ankle: Secondary | ICD-10-CM | POA: Diagnosis not present

## 2019-12-21 DIAGNOSIS — R600 Localized edema: Secondary | ICD-10-CM | POA: Diagnosis not present

## 2019-12-21 DIAGNOSIS — R739 Hyperglycemia, unspecified: Secondary | ICD-10-CM | POA: Diagnosis not present

## 2019-12-21 DIAGNOSIS — M25372 Other instability, left ankle: Secondary | ICD-10-CM | POA: Diagnosis not present

## 2019-12-21 DIAGNOSIS — R102 Pelvic and perineal pain: Secondary | ICD-10-CM | POA: Diagnosis not present

## 2019-12-21 DIAGNOSIS — N4281 Prostatodynia syndrome: Secondary | ICD-10-CM | POA: Diagnosis not present

## 2019-12-21 MED ORDER — CIPROFLOXACIN HCL 500 MG PO TABS: 500.0000 mg | ORAL_TABLET | Freq: Two times a day (BID) | ORAL | 0 refills | Status: AC

## 2019-12-21 NOTE — Progress Notes (Addendum)
Subjective:    Patient ID: Anthony [name], male    DOB: 03/08/1966, 54 y.o.   MRN: [id number]  HPI  Here to f/u; overall doing ok,  Pt denies chest pain, increasing sob or doe, wheezing, orthopnea, PND, increased LE swelling, palpitations, dizziness or syncope.  Pt denies new neurological symptoms such as new headache, or facial or extremity weakness or numbness.  Pt denies polydipsia, polyuria.  Denies urinary symptoms such as dysuria, frequency, urgency, flank pain, hematuria or n/v, fever, chills, but still has funny feeling discomfort in the pelvis for several hours after each instance of intercourse, was improved with doxy, now worse again. No fever, chills. Denies worsening depressive symptoms, suicidal ideation, or panic Past Medical History:  Diagnosis Date  . Anemia   . Anxiety   . Asthma   . Cataract   . Depression   . DVT (deep venous thrombosis) ([place])   . Fatty liver   . Genital herpes 05/06/2018  . H/O renal calculi 2006  . Hyperlipidemia   . Internal hemorrhoid   . Kidney stones   . PE (pulmonary embolism)   . Peptic ulcer disease   . Pneumonia   . Spondylosis   . Tubular adenoma of colon 2011   Dr Olevia Perches   Past Surgical History:  Procedure Laterality Date  . CATARACT EXTRACTION Left 1996   Dr Elonda Husky; [facility]  . colonoscopy with polypectomy  2011   Dr Olevia Perches, hemorrhoids  . CYSTOSCOPY W/ STONE MANIPULATION  2006  . INGUINAL HERNIA REPAIR Right 1990  . SHOULDER SURGERY Right 12/17/2010   Dr French Ana ; shoulder impingement & torn tendon    reports that he has never smoked. He has never used smokeless tobacco. He reports current alcohol use. He reports that he does not use drugs. family history includes Cancer in his paternal grandmother; Diabetes in his father; Glaucoma in his father; Heart attack in his maternal uncle; Heart attack (age of onset: 37) in his mother; Heart attack (age of onset: 42) in his maternal grandmother; Stroke (age of onset: 36) in his  father. Allergies  Allergen Reactions  . Aleve [Naproxen] Rash    Patient stated it was years ago  . Nsaids Other (See Comments)    Bleeding ulcers   Current Outpatient Medications on File Prior to Visit  Medication Sig Dispense Refill  . albuterol (PROVENTIL HFA;VENTOLIN HFA) 108 (90 Base) MCG/ACT inhaler Inhale 2 puffs into the lungs every 6 (six) hours as needed. For shortness of breath 1 Inhaler 1  . aspirin EC 81 MG tablet Take 81 mg by mouth daily.    . baclofen (LIORESAL) 10 MG tablet     . Brinzolamide-Brimonidine 1-0.2 % SUSP Place 1 drop into both eyes 2 (two) times daily.    . cyclobenzaprine (FLEXERIL) 5 MG tablet Take 1 tablet (5 mg total) by mouth 3 (three) times daily as needed for muscle spasms. 30 tablet 3  . diclofenac sodium (VOLTAREN) 1 % GEL Apply 4 g topically 4 (four) times daily as needed. 400 g 2  . docusate sodium (COLACE) 100 MG capsule Take 1 capsule (100 mg total) by mouth 2 (two) times daily. 60 capsule 0  . doxycycline (VIBRA-TABS) 100 MG tablet Take 1 tablet (100 mg total) by mouth 2 (two) times daily. 20 tablet 0  . fluticasone (FLONASE) 50 MCG/ACT nasal spray Place 2 sprays into both nostrils daily as needed for allergies. 16 g 3  . metaxalone (SKELAXIN) 800 MG tablet Take 800  mg by mouth 3 (three) times daily as needed.    . methocarbamol (ROBAXIN) 500 MG tablet Take 500 mg by mouth 3 (three) times daily.    . pantoprazole (PROTONIX) 40 MG tablet Take 1 tablet (40 mg total) by mouth daily. 90 tablet 3  . PARoxetine (PAXIL) 10 MG tablet Take 10 mg by mouth daily.    . polyethylene glycol powder (GLYCOLAX/MIRALAX) 17 GM/SCOOP powder Take 17 g by mouth 2 (two) times daily as needed. 3350 g 1  . SYMBICORT 160-4.5 MCG/ACT inhaler Inhale 2 puffs into the lungs daily as needed (shortness of breath).     . topiramate (TOPAMAX) 25 MG tablet topiramate 25 mg tablet    . valACYclovir (VALTREX) 1000 MG tablet TAKE 1/2 TABLET BY MOUTH TWICE DAILY FOR 3 DAYS OR 1 TABLET  BY MOUTH EVERY DAY FOR 5 DAYS AT ONSET OF BREAKOUT 90 tablet 1   No current facility-administered medications on file prior to visit.   Review of Systems All otherwise neg per pt 4    Objective:   Physical Exam BP 110/80 (BP Location: Left Arm, Patient Position: Sitting, Cuff Size: Large)   Pulse 71   Temp 98.4 F (36.9 C) (Oral)   Ht 5\' 6"  (1.676 m)   Wt 209 lb (94.8 kg)   SpO2 96%   BMI 33.73 kg/m  VS noted,  Constitutional: Pt appears in NAD HENT: Head: NCAT.  Right Ear: External ear normal.  Left Ear: External ear normal.  Eyes: . Pupils are equal, round, and reactive to light. Conjunctivae and EOM are normal Nose: without d/c or deformity Neck: Neck supple. Gross normal ROM Cardiovascular: Normal rate and regular rhythm.   Pulmonary/Chest: Effort normal and breath sounds without rales or wheezing.  Abd:  Soft, NT, ND, + BS, no organomegaly Neurological: Pt is alert. At baseline orientation, motor grossly intact Skin: Skin is warm. No rashes, other new lesions, no LE edema Psychiatric: Pt behavior is normal without agitation  All otherwise neg per pt Lab Results  Component Value Date   WBC 5.4 05/11/2019   HGB 14.7 05/11/2019   HCT 44.3 05/11/2019   PLT 237.0 05/11/2019   GLUCOSE 91 11/08/2019   CHOL 140 11/08/2019   TRIG 203 (H) 11/08/2019   HDL 48 11/08/2019   LDLDIRECT 89.0 04/20/2019   LDLCALC 65 11/08/2019   ALT 27 11/08/2019   AST 20 11/08/2019   NA 139 11/08/2019   K 4.4 11/08/2019   CL 106 11/08/2019   CREATININE 0.91 11/08/2019   BUN 9 11/08/2019   CO2 27 11/08/2019   TSH 0.99 05/11/2019   PSA 0.56 05/11/2019   HGBA1C 5.7 (H) 11/08/2019         Assessment & Plan:  cipro

## 2019-12-21 NOTE — Patient Instructions (Signed)
Please take all new medication as prescribed- the antibiotic  You will be contacted regarding the referral for: urology  Please continue all other medications as before, and refills have been done if requested.  Please have the pharmacy call with any other refills you may need.  Please keep your appointments with your specialists as you may have planned

## 2019-12-22 ENCOUNTER — Encounter: Payer: Self-pay | Admitting: Internal Medicine

## 2019-12-23 ENCOUNTER — Encounter: Payer: Self-pay | Admitting: Internal Medicine

## 2019-12-23 NOTE — Assessment & Plan Note (Addendum)
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

## 2019-12-23 NOTE — Assessment & Plan Note (Signed)
stable overall by history and exam, recent data reviewed with pt, and pt to continue medical treatment as before,  to f/u any worsening symptoms or concerns  

## 2019-12-24 DIAGNOSIS — N419 Inflammatory disease of prostate, unspecified: Secondary | ICD-10-CM | POA: Diagnosis not present

## 2019-12-24 DIAGNOSIS — N433 Hydrocele, unspecified: Secondary | ICD-10-CM | POA: Diagnosis not present

## 2019-12-24 DIAGNOSIS — N50812 Left testicular pain: Secondary | ICD-10-CM | POA: Diagnosis not present

## 2020-01-01 ENCOUNTER — Ambulatory Visit: Payer: Federal, State, Local not specified - PPO | Admitting: Urology

## 2020-01-01 ENCOUNTER — Other Ambulatory Visit: Payer: Self-pay

## 2020-01-01 ENCOUNTER — Encounter: Payer: Self-pay | Admitting: Urology

## 2020-01-01 VITALS — BP 122/84 | HR 82 | Ht 67.0 in | Wt 207.0 lb

## 2020-01-01 DIAGNOSIS — N342 Other urethritis: Secondary | ICD-10-CM

## 2020-01-01 DIAGNOSIS — R102 Pelvic and perineal pain: Secondary | ICD-10-CM | POA: Diagnosis not present

## 2020-01-01 LAB — BLADDER SCAN AMB NON-IMAGING: Scan Result: 25

## 2020-01-01 MED ORDER — CELECOXIB 100 MG PO CAPS: 100.0000 mg | ORAL_CAPSULE | Freq: Every day | ORAL | 0 refills | Status: DC

## 2020-01-01 NOTE — Progress Notes (Signed)
01/01/20 9:23 AM   Anthony [name] Mar 25, 1966 [id number]  CC: Urethral discomfort, testicular pain  HPI: I saw Anthony [name] in urology clinic today for the above issues.  He is a healthy 54 year old African-American male who reports an episode of urethral discomfort with a "funny" feeling in his urethra after intercourse.  He has also had some left scrotal discomfort and pain.  He underwent work-up with his PCP/urgent care with a benign urinalysis as well as a scrotal ultrasound that was normal aside from a small epididymal cyst on the left side.  He does have a history of an UTI approximately 3 years ago.  He denies any urinary symptoms currently of weak stream, urgency, frequency, leakage, or gross hematuria.  He was recently given a course of Cipro which he feels may have improved the urethral discomfort.  He also has some discomfort in his left upper leg.  PSA has been within the normal range.  Recent CT in January 2021 showed no abnormalities, including no hydronephrosis, stones.  PVR today is normal at 0 mL.  PMH: Past Medical History:  Diagnosis Date  . Anemia   . Anxiety   . Asthma   . Cataract   . Depression   . DVT (deep venous thrombosis) ([place])   . Fatty liver   . Genital herpes 05/06/2018  . H/O renal calculi 2006  . Hyperlipidemia   . Internal hemorrhoid   . Kidney stones   . PE (pulmonary embolism)   . Peptic ulcer disease   . Pneumonia   . Spondylosis   . Tubular adenoma of colon 2011   Dr Olevia Perches    Surgical History: Past Surgical History:  Procedure Laterality Date  . CATARACT EXTRACTION Left 1996   Dr Elonda Husky; [facility]  . colonoscopy with polypectomy  2011   Dr Olevia Perches, hemorrhoids  . CYSTOSCOPY W/ STONE MANIPULATION  2006  . INGUINAL HERNIA REPAIR Right 1990  . SHOULDER SURGERY Right 12/17/2010   Dr French Ana ; shoulder impingement & torn tendon    Family History: Family History  Problem Relation Age of Onset  . Heart attack Mother 31  . Stroke Father 75  .  Diabetes Father        PVD  . Glaucoma Father        blindness  . Cancer Paternal Grandmother        ? primary  . Heart attack Maternal Uncle         X2,pre 16  . Heart attack Maternal Grandmother 73  . Asthma Neg Hx   . COPD Neg Hx     Social History:  reports that he has never smoked. He has never used smokeless tobacco. He reports current alcohol use. He reports that he does not use drugs.  Physical Exam: BP 122/84   Pulse 82   Ht 5\' 7"  (1.702 m)   Wt 207 lb (93.9 kg)   BMI 32.42 kg/m    Constitutional:  Alert and oriented, No acute distress. Cardiovascular: No clubbing, cyanosis, or edema. Respiratory: Normal respiratory effort, no increased work of breathing. GI: Abdomen is soft, nontender, nondistended, no abdominal masses GU: Phallus with patent meatus, testicles 20 cc and descended bilaterally, nontender, no masses.  There is a bulging minimally tender mass of the left upper thigh likely consistent with a lipoma.  Laboratory Data: Reviewed  Pertinent Imaging: I have personally reviewed the CT from January 2021.  No hydronephrosis or stones.  Assessment & Plan:   In summary,  he is a 54 year old male with some mild urethral discomfort after intercourse, as well as some left scrotal pain.  Urinalysis is completely benign today, and CT in January 2021 was completely normal.  We discussed the complex etiologies of pelvic pain and inflammation.  I recommended a 10-day course of Celebrex for his pain, and we discussed behavioral strategies at length including wearing snug fitting underwear, icing as needed, and avoiding activities that accentuate his pain.  We reviewed his scrotal ultrasound and that no follow-up is needed for the epididymal cyst and these are common incidental benign findings.  Finally, regarding the bulge in his left upper thigh, I recommended discussing with his PCP or seeing a general surgeon if this becomes bothersome enough that he desires removal.  On exam  this is most consistent with a benign lipoma.  Behavioral strategies discussed at length Trial of 10 days of Celebrex for scrotal pain/urethral pain Follow-up as needed, return precautions discussed at length  Anthony Madrid, MD 01/01/2020  [facility] [address] [place], [place] [address] [phone]

## 2020-01-01 NOTE — Patient Instructions (Signed)
1. Wear snug fitting underwear(compression shorts) 2. Ice as needed for pain  Urethritis, Adult  Urethritis is swelling (inflammation) of the urethra. The urethra is the tube that drains urine from the bladder. It is important to get treatment for this condition early. Delayed treatment may lead to complications. What are the causes? This condition may be caused by:  Germs that are spread through sexual contact. This is the leading cause of urethritis. This may include bacterial or viral infections.  Injury to the urethra. Injury can happen after a thin, flexible tube (catheter) is inserted into the urethra to drain urine, or after medical instruments or foreign bodies are inserted into the area.  Chemical irritation. This may include contact with spermicide.  A disease that causes inflammation. This is rare. What increases the risk? The following factors may make you more likely to develop this condition:  Having sex without using a condom.  Having multiple sexual partners.  Having poor hygiene. What are the signs or symptoms? Symptoms of this condition include:  Pain with urination.  Frequent urination.  Urgent need to urinate.  Itching and pain in the vagina or penis.  Discharge coming from the penis. However, women rarely have symptoms. How is this diagnosed? This condition is diagnosed based on your medical history and symptoms as well as a physical exam. Tests may also be done. These may include:  Urine tests.  Swabs from the urethra. How is this treated? Treatment for this condition depends on the cause. Urethritis caused by a bacterial infection is treated with antibiotic medicine. Any sexual partners must also be treated. Follow these instructions at home: Medicines  Take over-the-counter and prescription medicines only as told by your health care provider.  If you were prescribed antibiotic medicine, take it as told by your health care provider. Do not stop  taking the antibiotic even if you start to feel better. Lifestyle  Avoid using perfumed soaps, bubble bath, and shampoo when you bathe or shower. Rinse the vaginal area after bathing.  Wear cotton underwear. Not wearing underwear when going to bed can help.  Make sure to wipe from front to back after using the toilet if you are male.  Do not have sex until your health care provider approves. When you do have sex, be sure to practice safe sex.  Tell anyone with whom you have had sexual relations in the past 60 days that he or she may be at risk of infection. General instructions  Drink enough fluid to keep your urine clear or pale yellow.  It is up to you to get your test results. Ask your health care provider, or the department that is doing the test, when your results will be ready.  Keep all follow-up visits as told by your health care provider. This is important.  Get tested again 3 months after treatment to make sure the infection is gone. It is important that your sexual partner also gets tested again. Contact a health care provider if:  Your symptoms have not improved after 3 days.  Your symptoms get worse.  You have eye redness or pain.  You develop abdominal pain or pelvic pain (in females).  You develop joint pain.  You have a fever. Get help right away if:  You have severe pain in the belly, back, or side.  You vomit repeatedly. Summary  Urethritis is a swelling (inflammation) of the urethra.  This condition is caused by germs that are spread through sexual contact. This  is the main cause of this illness.  It is important to get treatment for this condition early. Delayed treatment may lead to complications.  Treatment for this condition depends on the cause. Any sexual partners must also be treated. This information is not intended to replace advice given to you by your health care provider. Make sure you discuss any questions you have with your health  care provider. Document Revised: 04/02/2017 Document Reviewed: 05/26/2016 Elsevier Patient Education  [place].   Pelvic Pain, Male Pelvic pain is pain in your lower abdomen, below your belly button and between your hips. The pain may start suddenly (be acute), keep coming back (recur), or last a long time (become chronic). Pelvic pain that lasts longer than six months is considered chronic. There are many possible causes of pelvic pain. Sometimes, the cause is not known. Pelvic pain may affect your:  Prostate gland.  Urinary system.  Digestive tract.  Musculoskeletal system. Strained muscles or ligaments may cause pelvic pain. Follow these instructions at home:  Medicines  Take over-the-counter and prescription medicines only as told by your health care provider.  If you were prescribed an antibiotic medicine, take it as told by your health care provider. Do not stop taking the antibiotic even if you start to feel better. Managing pain, stiffness, and swelling   Take warm water baths (sitz baths). Sitz baths help with relaxing your pelvic floor muscles. ? For a sitz bath, the water only comes up to your hips and covers your buttocks. A sitz bath may done at home in a bathtub or with a portable sitz bath that fits over the toilet.  If directed, apply heat to the affected area before you exercise. Use the heat source that your health care provider recommends, such as a moist heat pack or a heating pad. ? Place a towel between your skin and the heat source. ? Leave the heat on for 20-30 minutes. ? Remove the heat if your skin turns bright red. This is especially important if you are unable to feel pain, heat, or cold. You may have a greater risk of getting burned. General instructions  Rest as told by your health care provider.  Keep a journal of your pelvic pain. Write down: ? When the pain started. ? Where the pain is located. ? What seems to make the pain better or  worse. ? Any symptoms you have along with the pain.  Follow your treatment plan as told by your health care provider. This may include: ? Pelvic physical therapy. ? Yoga, meditation, and exercise. ? Biofeedback. This process trains you to manage your body's response (physiological response) through breathing techniques and relaxation methods. You will work with a therapist while machines are used to monitor your physical symptoms. ? Acupuncture. This is a type of treatment that involves stimulating specific points on your body by inserting thin needles through your skin to treat pain.  Keep all follow-up visits as told by your health care provider. This is important. Contact a health care provider if:  Medicine does not help your pain.  Your pain comes back.  You have new symptoms.  You have a fever or chills.  You are constipated.  You have blood in your urine or stool.  You feel weak or light-headed. Get help right away if:  You have sudden severe pain.  Your pain steadily gets worse.  You have severe pain along with fever, nausea, vomiting, or excessive sweating. Summary  Pelvic  pain is pain in your lower abdomen, below your belly button and between your hips. There are many possible causes of pelvic pain. Sometimes, the cause is not known.  Take over-the-counter and prescription medicines only as told by your health care provider. If you were prescribed an antibiotic medicine, take it as told by your health care provider. Do not stop taking the antibiotic even if you start to feel better.  Contact a health care provider if you have new or worsening symptoms.  Get help right away if you have severe pain along with fever, nausea, vomiting, or excessive sweating.  Keep all follow-up visits as told by your health care provider. This is important. This information is not intended to replace advice given to you by your health care provider. Make sure you discuss any questions  you have with your health care provider. Document Revised: 09/08/2017 Document Reviewed: 09/08/2017 Elsevier Patient Education  [place].

## 2020-01-02 LAB — URINALYSIS, COMPLETE
Bilirubin, UA: NEGATIVE
Glucose, UA: NEGATIVE
Ketones, UA: NEGATIVE
Leukocytes,UA: NEGATIVE
Nitrite, UA: NEGATIVE
Protein,UA: NEGATIVE
RBC, UA: NEGATIVE
Specific Gravity, UA: 1.025 (ref 1.005–1.030)
Urobilinogen, Ur: 0.2 mg/dL (ref 0.2–1.0)
pH, UA: 6.5 (ref 5.0–7.5)

## 2020-01-02 LAB — MICROSCOPIC EXAMINATION: Bacteria, UA: NONE SEEN

## 2020-01-04 ENCOUNTER — Encounter: Payer: Self-pay | Admitting: Internal Medicine

## 2020-01-04 DIAGNOSIS — R300 Dysuria: Secondary | ICD-10-CM | POA: Diagnosis not present

## 2020-01-04 DIAGNOSIS — R319 Hematuria, unspecified: Secondary | ICD-10-CM | POA: Diagnosis not present

## 2020-01-06 ENCOUNTER — Ambulatory Visit
Admission: EM | Admit: 2020-01-06 | Discharge: 2020-01-06 | Disposition: A | Discharge status: Home / Self Care | Payer: Federal, State, Local not specified - PPO | Attending: Physician Assistant | Admitting: Physician Assistant

## 2020-01-06 ENCOUNTER — Encounter: Payer: Self-pay | Admitting: Emergency Medicine

## 2020-01-06 ENCOUNTER — Other Ambulatory Visit: Payer: Self-pay

## 2020-01-06 DIAGNOSIS — R300 Dysuria: Secondary | ICD-10-CM | POA: Diagnosis not present

## 2020-01-06 DIAGNOSIS — N342 Other urethritis: Secondary | ICD-10-CM | POA: Diagnosis not present

## 2020-01-06 LAB — URINALYSIS, COMPLETE (UACMP) WITH MICROSCOPIC
Bilirubin Urine: NEGATIVE
Glucose, UA: NEGATIVE mg/dL
Ketones, ur: NEGATIVE mg/dL
Leukocytes,Ua: NEGATIVE
Nitrite: NEGATIVE
Protein, ur: NEGATIVE mg/dL
Specific Gravity, Urine: 1.02 (ref 1.005–1.030)
pH: 5.5 (ref 5.0–8.0)

## 2020-01-06 LAB — CHLAMYDIA/NGC RT PCR (ARMC ONLY)
Chlamydia Tr: NOT DETECTED
N gonorrhoeae: NOT DETECTED

## 2020-01-06 MED ORDER — FLUCONAZOLE 150 MG PO TABS: 150.0000 mg | ORAL_TABLET | Freq: Once | ORAL | 0 refills | Status: AC

## 2020-01-06 MED ORDER — SULFAMETHOXAZOLE-TRIMETHOPRIM 800-160 MG PO TABS: 1.0000 | ORAL_TABLET | Freq: Two times a day (BID) | ORAL | 0 refills | Status: AC

## 2020-01-06 NOTE — Discharge Instructions (Signed)
There are many causes for dysuria and you have already been seen by a urologist.  It is best for you to continue following up with the urologist.  Today we have taken a urinalysis which shows trace blood, but no other abnormalities.  We will send urine for culture at this time.  We have also sent the urine for testing for gonorrhea, chlamydia, and trichomonas infections.  You have been treated with multiple different medications including Rocephin, doxycycline, Celebrex, and Cipro.  At this time we will try fluconazole, which is an antifungal medication.  It does not appear that you have been treated for fungal causes.  We will also try a different antibiotic for possible urinary infection.  It is possible that you could have chronic prostatitis, which is difficult to treat so you should definitely continue to follow-up with urology.  Keep appointment for ultrasound.  Follow-up sooner with urologist if needed.

## 2020-01-06 NOTE — ED Triage Notes (Signed)
Patient c/o burning when urinating off and on for a week.  Patient states that he saw Urologist this week and was started on Celebrex.

## 2020-01-06 NOTE — ED Provider Notes (Signed)
[facility]    CSN: [id number] Arrival date & time: 01/06/20  1344      History   Chief Complaint Chief Complaint  Patient presents with  . Dysuria    HPI Anthony [name] is a 54 y.o. male.   54 year old male presents for dysuria off and on for the past 2 years.  He says he has had increased symptoms over the past week or 2.  He does have a urologist that he recently saw for these concerns.  He was advised that he had urethritis and was treated with Celebrex patient says he is not sure this medication has helped.  He says he has pain toward the end of urination nearly every time.  He denies fever, fatigue, abdominal or pelvic pain.  Denies back pain.  Denies urethral discharge.  He denies rashes or skin changes.  He does have a history of genital herpes, but says he is not having outbreak.  He has seen multiple providers for these complaints and has been told he could have multiple different conditions including prostatitis, urethritis, urinary tract infection.  He says he has been treated with multiple antibiotics including doxycycline, Rocephin, Cipro.  He was recently on a 10-day course of Cipro and says that did seem to be helping.  He says his symptoms got worse after coming off medication.  He has had multiple studies including ultrasound of the scrotum.  He says he is due to have an ultrasound of his kidneys in the next couple of weeks and has plans to return to his urologist again.  He has no worsening of symptoms today and no new complaints or concerns.  He is sexually active, but says he has not been sexually active in the past couple of weeks due to being advised not to.  He has had negative STI testing.  No concerns today.     Past Medical History:  Diagnosis Date  . Anemia   . Anxiety   . Asthma   . Cataract   . Depression   . DVT (deep venous thrombosis) ([place])   . Fatty liver   . Genital herpes 05/06/2018  . H/O renal calculi 2006  . Hyperlipidemia   .  Internal hemorrhoid   . Kidney stones   . PE (pulmonary embolism)   . Peptic ulcer disease   . Pneumonia   . Spondylosis   . Tubular adenoma of colon 2011   Dr Olevia Perches    Patient Active Problem List   Diagnosis Date Noted  . Ankle instability 11/23/2019  . Leg cramping 09/04/2019  . Pain in joint of right elbow 08/03/2019  . Lateral epicondylitis of right elbow 08/03/2019  . Nephrolithiasis 05/11/2019  . Dark stools 05/11/2019  . Epigastric pain 05/11/2019  . Other constipation 05/11/2019  . Right sided abdominal pain 05/11/2019  . Paresthesia 04/20/2019  . Depression 01/14/2019  . Left ear hearing loss 01/14/2019  . Low back pain 01/14/2019  . Intractable migraine with aura without status migrainosus 01/07/2019  . Genital herpes 05/06/2018  . STD exposure 05/06/2018  . Obstructive sleep apnea 02/13/2018  . Diarrhea 01/24/2018  . Hyperglycemia 01/24/2018  . Pelvic pain 07/10/2017  . Exposure to blood or body fluid 07/07/2017  . Trigger thumb of right hand 05/21/2017  . Trigger finger, right middle finger 03/18/2017  . Anxiety 03/18/2017  . Chronic tension-type headache, not intractable 02/12/2017  . Herpes simplex type II infection 12/02/2016  . Dysuria 12/02/2016  . Pain of  right thumb 11/13/2016  . Other chest pain 07/27/2016  . Onychomycosis 06/12/2016  . Encounter for well adult exam with abnormal findings 06/12/2016  . Generalized headache 05/15/2016  . Lumbar paraspinal muscle spasm 05/15/2016  . Cervical paraspinal muscle spasm 05/15/2016  . History of DVT (deep vein thrombosis) 11/18/2015  . Elevated PSA 11/18/2015  . Acute pulmonary embolism ([place]) 2016 05/09/2015  . Peptic ulcer 05/09/2015  . Dyspnea 05/02/2015  . DVT (deep venous thrombosis) ([place]) 04/26/2015  . Duodenal ulcer 04/25/2015  . Positive D dimer 04/23/2015  . Internal hemorrhoid, bleeding 04/04/2015  . Acute gastrointestinal hemorrhage 04/03/2015  . Duodenitis 04/03/2015  . Postural  dizziness with presyncope 04/03/2015  . Microscopic hematuria 04/03/2015  . Asthma 12/31/2014  . Dislocated IOL (intraocular lens), initial encounter 12/10/2014  . MRSA infection 09/14/2013  . History of methicillin resistant Staphylococcus aureus infection 02/17/2013  . Spondylosis of lumbar joint 09/28/2012  . Fatty liver disease, nonalcoholic 60/02/9322  . Lumbosacral spondylosis without myelopathy 09/28/2012  . Fatty metamorphosis of liver 09/28/2012  . Atopic dermatitis 05/13/2012  . Seasonal and perennial allergic rhinitis 10/01/2011  . Family history of ischemic heart disease 04/02/2011  . 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/12/2011  . COLONIC POLYPS, HX OF 06/21/2009  . HLD (hyperlipidemia) 02/16/2008  . Asthma, mild persistent 02/16/2008  . NONSPECIFIC ABNORMAL ELECTROCARDIOGRAM 02/16/2008  . NEPHROLITHIASIS, HX OF 02/16/2008    Past Surgical History:  Procedure Laterality Date  . CATARACT EXTRACTION Left 1996   Dr Elonda Husky; [facility]  . colonoscopy with polypectomy  2011   Dr Olevia Perches, hemorrhoids  . CYSTOSCOPY W/ STONE MANIPULATION  2006  . INGUINAL HERNIA REPAIR Right 1990  . SHOULDER SURGERY Right 12/17/2010   Dr French Ana ; shoulder impingement & torn tendon       Home Medications    Prior to Admission medications   Medication Sig Start Date End Date Taking? Authorizing Provider  albuterol (PROVENTIL HFA;VENTOLIN HFA) 108 (90 Base) MCG/ACT inhaler Inhale 2 puffs into the lungs every 6 (six) hours as needed. For shortness of breath 06/29/16  Yes Martinique, Betty G, MD  aspirin EC 81 MG tablet Take 81 mg by mouth daily.   Yes [provider]  celecoxib (CELEBREX) 100 MG capsule Take 1 capsule (100 mg total) by mouth daily. 01/01/20  Yes Billey Co, MD  SYMBICORT 160-4.5 MCG/ACT inhaler Inhale 2 puffs into the lungs daily as needed (shortness of breath).  10/26/14  Yes [provider]  baclofen (LIORESAL) 10 MG tablet  07/01/19    [provider]  Brinzolamide-Brimonidine 1-0.2 % SUSP Place 1 drop into both eyes 2 (two) times daily.    [provider]  cyclobenzaprine (FLEXERIL) 5 MG tablet Take 1 tablet (5 mg total) by mouth 3 (three) times daily as needed for muscle spasms. 09/04/19   Biagio Borg, MD  diclofenac sodium (VOLTAREN) 1 % GEL Apply 4 g topically 4 (four) times daily as needed. 11/13/16   Biagio Borg, MD  docusate sodium (COLACE) 100 MG capsule Take 1 capsule (100 mg total) by mouth 2 (two) times daily. 05/11/19   Biagio Borg, MD  fluconazole (DIFLUCAN) 150 MG tablet Take 1 tablet (150 mg total) by mouth once for 1 dose. 01/06/20 01/06/20  Laurene Footman B, PA-C  fluticasone (FLONASE) 50 MCG/ACT nasal spray Place 2 sprays into both nostrils daily as needed for allergies. 04/29/18   Hoyt Koch, MD  metaxalone (SKELAXIN) 800 MG tablet  Take 800 mg by mouth 3 (three) times daily as needed. 06/01/19   [provider]  methocarbamol (ROBAXIN) 500 MG tablet Take 500 mg by mouth 3 (three) times daily. 08/03/19   [provider]  pantoprazole (PROTONIX) 40 MG tablet Take 1 tablet (40 mg total) by mouth daily. 05/11/19 05/10/20  Biagio Borg, MD  PARoxetine (PAXIL) 10 MG tablet Take 10 mg by mouth daily.    [provider]  polyethylene glycol powder (GLYCOLAX/MIRALAX) 17 GM/SCOOP powder Take 17 g by mouth 2 (two) times daily as needed. 05/11/19   Biagio Borg, MD  sulfamethoxazole-trimethoprim (BACTRIM DS) 800-160 MG tablet Take 1 tablet by mouth 2 (two) times daily for 7 days. 01/06/20 01/13/20  Laurene Footman B, PA-C  topiramate (TOPAMAX) 25 MG tablet topiramate 25 mg tablet    [provider]  valACYclovir (VALTREX) 1000 MG tablet TAKE 1/2 TABLET BY MOUTH TWICE DAILY FOR 3 DAYS OR 1 TABLET BY MOUTH EVERY DAY FOR 5 DAYS AT ONSET OF BREAKOUT 12/06/19   Biagio Borg, MD    Family History Family History  Problem Relation Age of Onset  . Heart attack Mother 16  . Stroke  Father 50  . Diabetes Father        PVD  . Glaucoma Father        blindness  . Cancer Paternal Grandmother        ? primary  . Heart attack Maternal Uncle         X2,pre 30  . Heart attack Maternal Grandmother 73  . Asthma Neg Hx   . COPD Neg Hx     Social History Social History   Tobacco Use  . Smoking status: Never Smoker  . Smokeless tobacco: Never Used  Vaping Use  . Vaping Use: Never used  Substance Use Topics  . Alcohol use: Yes    Alcohol/week: 0.0 standard drinks    Comment: Socially , < 2X/ month  . Drug use: No     Allergies   Aleve [naproxen] and Nsaids   Review of Systems Review of Systems  Constitutional: Negative for fatigue and fever.  Gastrointestinal: Negative for abdominal pain, nausea and vomiting.  Genitourinary: Positive for dysuria. Negative for difficulty urinating, flank pain, frequency, genital sores, hematuria, scrotal swelling, testicular pain and urgency.  Musculoskeletal: Negative for arthralgias, back pain and myalgias.  Skin: Negative for color change, rash and wound.  Neurological: Negative for dizziness, weakness and headaches.  Hematological: Negative for adenopathy.     Physical Exam Triage Vital Signs ED Triage Vitals  Enc Vitals Group     BP 01/06/20 1413 (!) 114/97     Pulse Rate 01/06/20 1413 80     Resp 01/06/20 1413 16     Temp 01/06/20 1413 98.4 F (36.9 C)     Temp Source 01/06/20 1413 Oral     SpO2 01/06/20 1413 99 %     Weight 01/06/20 1412 207 lb (93.9 kg)     Height 01/06/20 1412 5\' 7"  (1.702 m)     Head Circumference --      Peak Flow --      Pain Score 01/06/20 1411 2     Pain Loc --      Pain Edu? --      Excl. in [place]? --    No data found.  Updated Vital Signs BP (!) 114/97 (BP Location: Left Arm)   Pulse 80   Temp 98.4 F (36.9 C) (  Oral)   Resp 16   Ht 5\' 7"  (1.702 m)   Wt 207 lb (93.9 kg)   SpO2 99%   BMI 32.42 kg/m     Physical Exam Vitals and nursing note reviewed.  Constitutional:       General: He is not in acute distress.    Appearance: Normal appearance. He is well-developed and normal weight. He is not toxic-appearing.  HENT:     Head: Normocephalic and atraumatic.  Eyes:     General: No scleral icterus.    Conjunctiva/sclera: Conjunctivae normal.  Cardiovascular:     Rate and Rhythm: Normal rate and regular rhythm.     Heart sounds: Normal heart sounds. No murmur heard.   Pulmonary:     Effort: Pulmonary effort is normal. No respiratory distress.     Breath sounds: Normal breath sounds.  Abdominal:     Palpations: Abdomen is soft.     Tenderness: There is no abdominal tenderness. There is no right CVA tenderness or left CVA tenderness.  Musculoskeletal:     Cervical back: Neck supple.  Skin:    General: Skin is warm and dry.  Neurological:     General: No focal deficit present.     Mental Status: He is alert. Mental status is at baseline.     Motor: No weakness.     Gait: Gait normal.  Psychiatric:        Mood and Affect: Mood normal.        Behavior: Behavior normal.        Thought Content: Thought content normal.      UC Treatments / Results  Labs (all labs ordered are listed, but only abnormal results are displayed) Labs Reviewed  URINALYSIS, COMPLETE (UACMP) WITH MICROSCOPIC - Abnormal; Notable for the following components:      Result Value   Hgb urine dipstick TRACE (*)    Bacteria, UA FEW (*)    All other components within normal limits  CHLAMYDIA/NGC RT PCR ([facility] ONLY)  TRICHOMONAS VAGINALIS, PROBE AMP  URINE CULTURE    EKG   Radiology No results found.  Procedures Procedures (including critical care time)  Medications Ordered in UC Medications - No data to display  Initial Impression / Assessment and Plan / UC Course  I have reviewed the triage vital signs and the nursing notes.  Pertinent labs & imaging results that were available during my care of the patient were reviewed by me and considered in my  medical decision making (see chart for details).   I have reviewed multiple records from the patient's internal medicine physician as well as his urologist.  Reviewed lab test results, ultrasound and CT scans.  Patient saw his urologist 4 days ago noted to have a normal exam.  Patient denies any new or worsening symptoms.  CT scan from January 2021 does not show any renal stones or hydronephrosis.  Aware of the spermatocele on scrotal ultrasound.   Urinalysis was repeated today which shows trace blood and few bacteria.  Urine culture sent.  Also sent urine for testing for gonorrhea, chlamydia, and trichomonas.  At this time treating patient with Bactrim DS and fluconazole to cover for other possible bacterial causes as well as possible fungal conditions.  Advised patient to continue close follow-up with urologist and his internal medicine provider.  Advised him to increase fluids and rest.  He can take over-the-counter Azo if absolutely needed for painful urination.  Advised him to abstain from intercourse until the  STI testing returns.  Advised him to go to ED if he has fever, pelvic pain, testicular pain or swelling or increased pain with urination.   Final Clinical Impressions(s) / UC Diagnoses   Final diagnoses:  Dysuria  Urethritis     Discharge Instructions     There are many causes for dysuria and you have already been seen by a urologist.  It is best for you to continue following up with the urologist.  Today we have taken a urinalysis which shows trace blood, but no other abnormalities.  We will send urine for culture at this time.  We have also sent the urine for testing for gonorrhea, chlamydia, and trichomonas infections.  You have been treated with multiple different medications including Rocephin, doxycycline, Celebrex, and Cipro.  At this time we will try fluconazole, which is an antifungal medication.  It does not appear that you have been treated for fungal causes.  We will also  try a different antibiotic for possible urinary infection.  It is possible that you could have chronic prostatitis, which is difficult to treat so you should definitely continue to follow-up with urology.  Keep appointment for ultrasound.  Follow-up sooner with urologist if needed.    ED Prescriptions    Medication Sig Dispense Auth. Provider   sulfamethoxazole-trimethoprim (BACTRIM DS) 800-160 MG tablet Take 1 tablet by mouth 2 (two) times daily for 7 days. 14 tablet Laurene Footman B, PA-C   fluconazole (DIFLUCAN) 150 MG tablet Take 1 tablet (150 mg total) by mouth once for 1 dose. 1 tablet Gretta Cool     PDMP not reviewed this encounter.   Danton Clap, PA-C 01/06/20 1611

## 2020-01-08 LAB — URINE CULTURE: Culture: NO GROWTH

## 2020-01-10 LAB — TRICHOMONAS VAGINALIS, PROBE AMP
Trich vag by NAA: NEGATIVE
Trich vaginalis Source: 188052

## 2020-01-12 DIAGNOSIS — N400 Enlarged prostate without lower urinary tract symptoms: Secondary | ICD-10-CM | POA: Diagnosis not present

## 2020-01-12 DIAGNOSIS — Q6102 Congenital multiple renal cysts: Secondary | ICD-10-CM | POA: Diagnosis not present

## 2020-01-13 DIAGNOSIS — R309 Painful micturition, unspecified: Secondary | ICD-10-CM | POA: Diagnosis not present

## 2020-01-13 DIAGNOSIS — R300 Dysuria: Secondary | ICD-10-CM | POA: Diagnosis not present

## 2020-01-15 ENCOUNTER — Telehealth: Payer: Self-pay

## 2020-01-15 ENCOUNTER — Encounter: Payer: Self-pay | Admitting: Internal Medicine

## 2020-01-15 NOTE — Telephone Encounter (Signed)
Called pt no answer. LM for pt advising him of the need for cysto and the need to call back to schedule appointment.      [facility].  He needs a cystoscopy as recommended previously, this will evaluate if herpes lesions present in the urethra.  Okay to move up cystoscopy if he would like  [name], MD 01/15/2020

## 2020-01-15 NOTE — Telephone Encounter (Signed)
I cannot [facility].  He needs a cystoscopy as recommended previously, this will evaluate if herpes lesions present in the urethra.  Okay to move up cystoscopy if he would like  [name], MD 01/15/2020

## 2020-01-15 NOTE — Telephone Encounter (Signed)
Spoke to patient and appointment made.

## 2020-01-17 ENCOUNTER — Other Ambulatory Visit: Payer: Self-pay

## 2020-01-17 ENCOUNTER — Encounter: Payer: Self-pay | Admitting: Urology

## 2020-01-17 ENCOUNTER — Ambulatory Visit (INDEPENDENT_AMBULATORY_CARE_PROVIDER_SITE_OTHER): Payer: Federal, State, Local not specified - PPO | Admitting: Urology

## 2020-01-17 VITALS — BP 140/90 | HR 88 | Ht 66.0 in | Wt 209.2 lb

## 2020-01-17 DIAGNOSIS — N4283 Cyst of prostate: Secondary | ICD-10-CM | POA: Diagnosis not present

## 2020-01-17 DIAGNOSIS — N342 Other urethritis: Secondary | ICD-10-CM | POA: Diagnosis not present

## 2020-01-17 MED ORDER — LIDOCAINE HCL URETHRAL/MUCOSAL 2 % EX GEL
1.0000 "application " | Freq: Once | CUTANEOUS | Status: AC
  Administered 2020-01-17: 1 via URETHRAL

## 2020-01-17 NOTE — Progress Notes (Signed)
Cystoscopy Procedure Note:  Indication: Dysuria  54 year old male with extensive work-up for 2 years of dysuria at the end of his stream, that is intermittent in nature, but worse over the last few weeks.  He has had multiple completely negative urinalyses and negative urine culture, and STD testing was negative within the last 2 weeks.  After informed consent and discussion of the procedure and its risks, SPURGEON GANCARZ was positioned and prepped in the standard fashion. Cystoscopy was performed with a flexible cystoscope. The urethra, bladder neck and entire bladder was visualized in a standard fashion. The prostate was small, but there was an abnormal white cystic-appearing structure emanating from the posterior aspect of the prostatic urethra just proximal to the verumontanum that was quite abnormal.  This was about 1 cm in size and projected into the prostatic lumen.The ureteral orifices were visualized in their normal location and orientation.  Bladder mucosa grossly normal throughout.  No abnormalities on retroflexion.  Imaging: I personally reviewed the CT abdomen pelvis with contrast dated 05/19/2019.  There is a 1 cm cystic lesion located centrally in the prostate with no evidence of enhancement that likely correlates to the findings on cystoscopy today  Findings: Abnormal 1 cm prostatic cyst projecting into the prostatic urethra just proximal to the verumontanum  ------------------------------------------------------------------  Assessment and Plan: We had a long conversation about his cystoscopy findings today.  We discussed options including observation, or intervention with likely biopsy and transurethral resection of this abnormal lesion within the prostate.  PSA has been normal, and I suspect this is benign, however we discussed that without tissue I cannot 100% guarantee this is not a malignancy.  It certainly appears benign on cystoscopy and CT.  We discussed the risks and  benefits of intervention including bleeding, infection, persistent dysuria, and need for recurrent procedures.  Anticipate he would be able to discharge home without a catheter in place.  Schedule cystoscopy, biopsy of prostatic urethral lesion, and transurethral resection of prostatic lesion  [name], MD 01/17/2020

## 2020-01-18 LAB — URINALYSIS, COMPLETE
Bilirubin, UA: NEGATIVE
Glucose, UA: NEGATIVE
Ketones, UA: NEGATIVE
Leukocytes,UA: NEGATIVE
Nitrite, UA: NEGATIVE
Protein,UA: NEGATIVE
Specific Gravity, UA: 1.025 (ref 1.005–1.030)
Urobilinogen, Ur: 0.2 mg/dL (ref 0.2–1.0)
pH, UA: 6 (ref 5.0–7.5)

## 2020-01-18 LAB — MICROSCOPIC EXAMINATION: Bacteria, UA: NONE SEEN

## 2020-01-22 DIAGNOSIS — N5201 Erectile dysfunction due to arterial insufficiency: Secondary | ICD-10-CM | POA: Diagnosis not present

## 2020-01-22 DIAGNOSIS — N4283 Cyst of prostate: Secondary | ICD-10-CM | POA: Diagnosis not present

## 2020-01-25 ENCOUNTER — Other Ambulatory Visit: Payer: Self-pay

## 2020-01-25 ENCOUNTER — Encounter: Payer: Self-pay | Admitting: Internal Medicine

## 2020-01-30 ENCOUNTER — Ambulatory Visit: Payer: Federal, State, Local not specified - PPO | Admitting: Family

## 2020-01-30 ENCOUNTER — Other Ambulatory Visit: Payer: Self-pay

## 2020-01-30 VITALS — BP 116/76 | HR 96 | Temp 97.9°F | Ht 66.0 in | Wt 206.0 lb

## 2020-01-30 DIAGNOSIS — K625 Hemorrhage of anus and rectum: Secondary | ICD-10-CM

## 2020-01-30 DIAGNOSIS — K635 Polyp of colon: Secondary | ICD-10-CM

## 2020-01-30 LAB — CBC WITH DIFFERENTIAL/PLATELET
Absolute Monocytes: 622 cells/uL (ref 200–950)
Basophils Absolute: 50 cells/uL (ref 0–200)
Basophils Relative: 0.9 %
Eosinophils Absolute: 319 cells/uL (ref 15–500)
Eosinophils Relative: 5.8 %
HCT: 44.9 % (ref 38.5–50.0)
Hemoglobin: 14.9 g/dL (ref 13.2–17.1)
Lymphs Abs: 1771 cells/uL (ref 850–3900)
MCH: 27.6 pg (ref 27.0–33.0)
MCHC: 33.2 g/dL (ref 32.0–36.0)
MCV: 83.3 fL (ref 80.0–100.0)
MPV: 9.7 fL (ref 7.5–12.5)
Monocytes Relative: 11.3 %
Neutro Abs: 2739 cells/uL (ref 1500–7800)
Neutrophils Relative %: 49.8 %
Platelets: 258 10*3/uL (ref 140–400)
RBC: 5.39 10*6/uL (ref 4.20–5.80)
RDW: 15 % (ref 11.0–15.0)
Total Lymphocyte: 32.2 %
WBC: 5.5 10*3/uL (ref 3.8–10.8)

## 2020-01-30 LAB — FERRITIN: Ferritin: 140 ng/mL (ref 38–380)

## 2020-01-30 NOTE — Progress Notes (Signed)
Anthony [name] is a 54 y.o. male with the following history as recorded in EpicCare:  Patient Active Problem List   Diagnosis Date Noted  . Ankle instability 11/23/2019  . Leg cramping 09/04/2019  . Pain in joint of right elbow 08/03/2019  . Lateral epicondylitis of right elbow 08/03/2019  . Nephrolithiasis 05/11/2019  . Dark stools 05/11/2019  . Epigastric pain 05/11/2019  . Other constipation 05/11/2019  . Right sided abdominal pain 05/11/2019  . Paresthesia 04/20/2019  . Depression 01/14/2019  . Left ear hearing loss 01/14/2019  . Low back pain 01/14/2019  . Intractable migraine with aura without status migrainosus 01/07/2019  . Genital herpes 05/06/2018  . STD exposure 05/06/2018  . Obstructive sleep apnea 02/13/2018  . Diarrhea 01/24/2018  . Hyperglycemia 01/24/2018  . Pelvic pain 07/10/2017  . Exposure to blood or body fluid 07/07/2017  . Trigger thumb of right hand 05/21/2017  . Trigger finger, right middle finger 03/18/2017  . Anxiety 03/18/2017  . Chronic tension-type headache, not intractable 02/12/2017  . Herpes simplex type II infection 12/02/2016  . Dysuria 12/02/2016  . Pain of right thumb 11/13/2016  . Other chest pain 07/27/2016  . Onychomycosis 06/12/2016  . Encounter for well adult exam with abnormal findings 06/12/2016  . Generalized headache 05/15/2016  . Lumbar paraspinal muscle spasm 05/15/2016  . Cervical paraspinal muscle spasm 05/15/2016  . History of DVT (deep vein thrombosis) 11/18/2015  . Elevated PSA 11/18/2015  . Acute pulmonary embolism ([place]) 2016 05/09/2015  . Peptic ulcer 05/09/2015  . Dyspnea 05/02/2015  . DVT (deep venous thrombosis) ([place]) 04/26/2015  . Duodenal ulcer 04/25/2015  . Positive D dimer 04/23/2015  . Internal hemorrhoid, bleeding 04/04/2015  . Acute gastrointestinal hemorrhage 04/03/2015  . Duodenitis 04/03/2015  . Postural dizziness with presyncope 04/03/2015  . Microscopic hematuria 04/03/2015  . Asthma 12/31/2014   . Dislocated IOL (intraocular lens), initial encounter 12/10/2014  . MRSA infection 09/14/2013  . History of methicillin resistant Staphylococcus aureus infection 02/17/2013  . Spondylosis of lumbar joint 09/28/2012  . Fatty liver disease, nonalcoholic 88/50/2774  . Lumbosacral spondylosis without myelopathy 09/28/2012  . Fatty metamorphosis of liver 09/28/2012  . Atopic dermatitis 05/13/2012  . Seasonal and perennial allergic rhinitis 10/01/2011  . Family history of ischemic heart disease 04/02/2011  . 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/12/2011  . COLONIC POLYPS, HX OF 06/21/2009  . HLD (hyperlipidemia) 02/16/2008  . Asthma, mild persistent 02/16/2008  . NONSPECIFIC ABNORMAL ELECTROCARDIOGRAM 02/16/2008  . NEPHROLITHIASIS, HX OF 02/16/2008    Current Outpatient Medications  Medication Sig Dispense Refill  . acyclovir (ZOVIRAX) 400 MG tablet Take 400 mg by mouth 3 (three) times daily.    Marland Kitchen albuterol (PROVENTIL HFA;VENTOLIN HFA) 108 (90 Base) MCG/ACT inhaler Inhale 2 puffs into the lungs every 6 (six) hours as needed. For shortness of breath 1 Inhaler 1  . aspirin EC 81 MG tablet Take 81 mg by mouth daily.    . baclofen (LIORESAL) 10 MG tablet     . Brinzolamide-Brimonidine 1-0.2 % SUSP Place 1 drop into both eyes 2 (two) times daily.    . celecoxib (CELEBREX) 100 MG capsule Take 1 capsule (100 mg total) by mouth daily. 10 capsule 0  . cyclobenzaprine (FLEXERIL) 5 MG tablet Take 1 tablet (5 mg total) by mouth 3 (three) times daily as needed for muscle spasms. 30 tablet 3  . diclofenac sodium (VOLTAREN) 1 % GEL Apply 4 g topically 4 (four) times daily as needed.  400 g 2  . docusate sodium (COLACE) 100 MG capsule Take 1 capsule (100 mg total) by mouth 2 (two) times daily. 60 capsule 0  . fluticasone (FLONASE) 50 MCG/ACT nasal spray Place 2 sprays into both nostrils daily as needed for allergies. 16 g 3  . metaxalone (SKELAXIN) 800 MG tablet Take 800 mg by  mouth 3 (three) times daily as needed.    . methocarbamol (ROBAXIN) 500 MG tablet Take 500 mg by mouth 3 (three) times daily.    . pantoprazole (PROTONIX) 40 MG tablet Take 1 tablet (40 mg total) by mouth daily. 90 tablet 3  . PARoxetine (PAXIL) 10 MG tablet Take 10 mg by mouth daily.    . polyethylene glycol powder (GLYCOLAX/MIRALAX) 17 GM/SCOOP powder Take 17 g by mouth 2 (two) times daily as needed. 3350 g 1  . SYMBICORT 160-4.5 MCG/ACT inhaler Inhale 2 puffs into the lungs daily as needed (shortness of breath).     . topiramate (TOPAMAX) 25 MG tablet topiramate 25 mg tablet    . valACYclovir (VALTREX) 1000 MG tablet TAKE 1/2 TABLET BY MOUTH TWICE DAILY FOR 3 DAYS OR 1 TABLET BY MOUTH EVERY DAY FOR 5 DAYS AT ONSET OF BREAKOUT 90 tablet 1   No current facility-administered medications for this visit.    Allergies: Aleve [naproxen] and Nsaids  Past Medical History:  Diagnosis Date  . Anemia   . Anxiety   . Asthma   . Cataract   . Depression   . DVT (deep venous thrombosis) ([place])   . Fatty liver   . Genital herpes 05/06/2018  . H/O renal calculi 2006  . Hyperlipidemia   . Internal hemorrhoid   . Kidney stones   . PE (pulmonary embolism)   . Peptic ulcer disease   . Pneumonia   . Spondylosis   . Tubular adenoma of colon 2011   Dr Olevia Perches    Past Surgical History:  Procedure Laterality Date  . CATARACT EXTRACTION Left 1996   Dr Elonda Husky; [facility]  . colonoscopy with polypectomy  2011   Dr Olevia Perches, hemorrhoids  . CYSTOSCOPY W/ STONE MANIPULATION  2006  . INGUINAL HERNIA REPAIR Right 1990  . SHOULDER SURGERY Right 12/17/2010   Dr French Ana ; shoulder impingement & torn tendon    Family History  Problem Relation Age of Onset  . Heart attack Mother 84  . Stroke Father 67  . Diabetes Father        PVD  . Glaucoma Father        blindness  . Cancer Paternal Grandmother        ? primary  . Heart attack Maternal Uncle         X2,pre 16  . Heart attack Maternal Grandmother 73  . Asthma  Neg Hx   . COPD Neg Hx     Social History   Tobacco Use  . Smoking status: Never Smoker  . Smokeless tobacco: Never Used  Substance Use Topics  . Alcohol use: Yes    Alcohol/week: 0.0 standard drinks    Comment: Socially , < 2X/ month    Subjective:  Patient is planning to have prostate surgery in the next few weeks; notes that he had a rectal exam in preparation for the urologic surgery and noticed some bright red blood immediately after;  Notes that has had no further blood in the past week;  Last colonoscopy done in 2016- thinks it was a 10 year follow-up but unsure; did have polyps on  colonoscopy done in 2011;    Objective:  Vitals:   01/30/20 1045  BP: 116/76  Pulse: 96  Temp: 97.9 F (36.6 C)  TempSrc: Oral  SpO2: 97%  Weight: 206 lb (93.4 kg)  Height: 5\' 6"  (1.676 m)    General: Well developed, well nourished, in no acute distress  Skin : Warm and dry.  Head: Normocephalic and atraumatic  Lungs: Respirations unlabored;  Neurologic: Alert and oriented; speech intact; face symmetrical; moves all extremities well; CNII-XII intact without focal deficit  Rectal exam- no abnormality noted;  Assessment:  1. Rectal bleeding   2. Colorectal polyp detected on colonoscopy     Plan:  ? Tear or hemorrhoid secondary to recent rectal exam; symptoms have resolved; check CBC, ferritin today to make sure not missing bleed; Refer to GI for possible colonoscopy- unable to see path report from 2016 colonoscopy- will wait for their response about scheduling.  This visit occurred during the SARS-CoV-2 public health emergency.  Safety protocols were in place, including screening questions prior to the visit, additional usage of staff PPE, and extensive cleaning of exam room while observing appropriate contact time as indicated for disinfecting solutions.     No follow-ups on file.  Orders Placed This Encounter  Procedures  . CBC with Differential/Platelet    Standing Status:    Future    Standing Expiration Date:   01/29/2021  . Ferritin    Standing Status:   Future    Standing Expiration Date:   01/29/2021  . Ambulatory referral to Gastroenterology    Referral Priority:   Routine    Referral Type:   Consultation    Referral Reason:   Specialty Services Required    Number of Visits Requested:   1    Requested Prescriptions    No prescriptions requested or ordered in this encounter

## 2020-02-07 DIAGNOSIS — Z20822 Contact with and (suspected) exposure to covid-19: Secondary | ICD-10-CM | POA: Diagnosis not present

## 2020-02-09 ENCOUNTER — Other Ambulatory Visit: Payer: Self-pay | Admitting: Family Medicine

## 2020-02-09 DIAGNOSIS — N4283 Cyst of prostate: Secondary | ICD-10-CM

## 2020-02-12 ENCOUNTER — Other Ambulatory Visit: Payer: Self-pay | Admitting: Urology

## 2020-02-12 ENCOUNTER — Other Ambulatory Visit: Payer: Federal, State, Local not specified - PPO

## 2020-02-12 ENCOUNTER — Other Ambulatory Visit: Payer: Self-pay

## 2020-02-12 ENCOUNTER — Telehealth: Payer: Self-pay | Admitting: Gastroenterology

## 2020-02-12 DIAGNOSIS — N4283 Cyst of prostate: Secondary | ICD-10-CM

## 2020-02-12 NOTE — Telephone Encounter (Signed)
Received records from [facility] forwarded 5 pages to [facility] (Dr. Mallie Mussel Danis)10/11/21fbg

## 2020-02-13 ENCOUNTER — Encounter
Admission: RE | Admit: 2020-02-13 | Discharge: 2020-02-13 | Disposition: A | Discharge status: Home / Self Care | Payer: No Typology Code available for payment source | Source: Ambulatory Visit | Attending: Urology | Admitting: Urology

## 2020-02-13 HISTORY — DX: Personal history of Methicillin resistant Staphylococcus aureus infection: Z86.14

## 2020-02-13 HISTORY — DX: Sleep apnea, unspecified: G47.30

## 2020-02-13 HISTORY — DX: Headache, unspecified: R51.9

## 2020-02-13 HISTORY — DX: Post-traumatic stress disorder, unspecified: F43.10

## 2020-02-13 HISTORY — DX: Personal history of urinary calculi: Z87.442

## 2020-02-13 HISTORY — DX: Other complications of anesthesia, initial encounter: T88.59XA

## 2020-02-13 LAB — URINALYSIS, COMPLETE
Bilirubin, UA: NEGATIVE
Glucose, UA: NEGATIVE
Ketones, UA: NEGATIVE
Leukocytes,UA: NEGATIVE
Nitrite, UA: NEGATIVE
Protein,UA: NEGATIVE
Specific Gravity, UA: 1.025 (ref 1.005–1.030)
Urobilinogen, Ur: 1 mg/dL (ref 0.2–1.0)
pH, UA: 7 (ref 5.0–7.5)

## 2020-02-13 LAB — MICROSCOPIC EXAMINATION
Bacteria, UA: NONE SEEN
Epithelial Cells (non renal): NONE SEEN /hpf (ref 0–10)
WBC, UA: NONE SEEN /hpf (ref 0–5)

## 2020-02-13 NOTE — Patient Instructions (Signed)
Your procedure is scheduled on:02-23-20 FRIDAY Report to Day Surgery on the 2nd floor of the [facility]. To find out your arrival time, please call [phone] between 1PM - 3PM on: 02-22-20 THURSDAY  REMEMBER: Instructions that are not followed completely may result in serious medical risk, up to and including death; or upon the discretion of your surgeon and anesthesiologist your surgery may need to be rescheduled.  Do not eat food after midnight the night before surgery.  No gum chewing, lozengers or hard candies.  You may however, drink CLEAR liquids up to 2 hours before you are scheduled to arrive for your surgery. Do not drink anything within 2 hours of your scheduled arrival time.  Clear liquids include: - water  - apple juice without pulp - gatorade (not RED) - black coffee or tea (Do NOT add milk or creamers to the coffee or tea) Do NOT drink anything that is not on this list.  TAKE THESE MEDICATIONS THE MORNING OF SURGERY WITH A SIP OF WATER: -YOU MAY TAKE YOUR PAXIL (PAROXETINE) IF NEEDED FOR ANXIETY THE MORNING OF SURGERY  Use inhalers on the day of surgery and bring to the [facility] AND BRING YOUR ALBUTEROL INHALER TO [facility]  Follow recommendations from Cardiologist, Pulmonologist or PCP regarding stopping Aspirin, Coumadin, Plavix, Eliquis, Pradaxa, or Pletal-CALL DR SNINSKY'S OFFICE ABOUT WHEN TO STOP YOUR ASPIRIN  One week prior to surgery: Stop Anti-inflammatories (NSAIDS) such as Advil, Aleve, Ibuprofen, Motrin, Naproxen, Naprosyn and Aspirin based products such as Excedrin, Goodys Powder, BC Powder-OK TO TAKE TYLENOL IF NEEDED  Stop ANY OVER THE COUNTER supplements until after surgery. (You may continue taking Tylenol, Vitamin D, Vitamin B, and multivitamin.)  No Alcohol for 24 hours before or after surgery.  No Smoking including e-cigarettes for 24 hours prior to surgery.  No chewable  tobacco products for at least 6 hours prior to surgery.  No nicotine patches on the day of surgery.  Do not use any "recreational" drugs for at least a week prior to your surgery.  Please be advised that the combination of cocaine and anesthesia may have negative outcomes, up to and including death. If you test positive for cocaine, your surgery will be cancelled.  On the morning of surgery brush your teeth with toothpaste and water, you may rinse your mouth with mouthwash if you wish. Do not swallow any toothpaste or mouthwash.  Do not wear jewelry, make-up, hairpins, clips or nail polish.  Do not wear lotions, powders, or perfumes.   Do not shave 48 hours prior to surgery.   Contact lenses, hearing aids and dentures may not be worn into surgery.  Do not bring valuables to the hospital. [facility] is not responsible for any missing/lost belongings or valuables.   Notify your doctor if there is any change in your medical condition (cold, fever, infection).  Wear comfortable clothing (specific to your surgery type) to the hospital.  Plan for stool softeners for home use; pain medications have a tendency to cause constipation. You can also help prevent constipation by eating foods high in fiber such as fruits and vegetables and drinking plenty of fluids as your diet allows.  After surgery, you can help prevent lung complications by doing breathing exercises.  Take deep breaths and cough every 1-2 hours. Your doctor may order a device called an Incentive Spirometer to help you take deep breaths. When coughing or sneezing, hold a pillow firmly  against your incision with both hands. This is called "splinting." Doing this helps protect your incision. It also decreases belly discomfort.  If you are being admitted to the hospital overnight, leave your suitcase in the car. After surgery it may be brought to your room.  If you are being discharged the day of surgery, you will not be allowed  to drive home. You will need a responsible adult (18 years or older) to drive you home and stay with you that night.   If you are taking public transportation, you will need to have a responsible adult (18 years or older) with you. Please confirm with your physician that it is acceptable to use public transportation.   Please call the [facility] Dept. at [phone] if you have any questions about these instructions.  Visitation Policy:  Patients undergoing a surgery or procedure may have one family member or support person with them as long as that person is not COVID-19 positive or experiencing its symptoms.  That person may remain in the waiting area during the procedure.  Inpatient Visitation Update:   In an effort to ensure the safety of our team members and our patients, we are implementing a change to our visitation policy:  Effective Monday, Aug. 9, at 7 a.m., inpatients will be allowed one support person.  o The support person may change daily.  o The support person must pass our screening, gel in and out, and wear a mask at all times, including in the patient's room.  o Patients must also wear a mask when staff or their support person are in the room.  o Masking is required regardless of vaccination status.  Systemwide, no visitors 17 or younger.

## 2020-02-15 LAB — CULTURE, URINE COMPREHENSIVE

## 2020-02-15 NOTE — Telephone Encounter (Signed)
These are EGD,colonsocopy and Bx reports already known to [place] at the time of office visit in Jan 2021.

## 2020-02-20 ENCOUNTER — Encounter: Payer: Self-pay | Admitting: Internal Medicine

## 2020-02-20 ENCOUNTER — Other Ambulatory Visit (INDEPENDENT_AMBULATORY_CARE_PROVIDER_SITE_OTHER): Payer: No Typology Code available for payment source

## 2020-02-20 DIAGNOSIS — K921 Melena: Secondary | ICD-10-CM

## 2020-02-20 LAB — CBC WITH DIFFERENTIAL/PLATELET
Basophils Absolute: 0 10*3/uL (ref 0.0–0.1)
Basophils Relative: 0.6 % (ref 0.0–3.0)
Eosinophils Absolute: 0.3 10*3/uL (ref 0.0–0.7)
Eosinophils Relative: 5.5 % — ABNORMAL HIGH (ref 0.0–5.0)
HCT: 43.3 % (ref 39.0–52.0)
Hemoglobin: 14.3 g/dL (ref 13.0–17.0)
Lymphocytes Relative: 35.3 % (ref 12.0–46.0)
Lymphs Abs: 2.1 10*3/uL (ref 0.7–4.0)
MCHC: 33.1 g/dL (ref 30.0–36.0)
MCV: 84.4 fl (ref 78.0–100.0)
Monocytes Absolute: 0.5 10*3/uL (ref 0.1–1.0)
Monocytes Relative: 7.9 % (ref 3.0–12.0)
Neutro Abs: 3 10*3/uL (ref 1.4–7.7)
Neutrophils Relative %: 50.7 % (ref 43.0–77.0)
Platelets: 256 10*3/uL (ref 150.0–400.0)
RBC: 5.13 Mil/uL (ref 4.22–5.81)
RDW: 16.3 % — ABNORMAL HIGH (ref 11.5–15.5)
WBC: 5.9 10*3/uL (ref 4.0–10.5)

## 2020-02-21 ENCOUNTER — Other Ambulatory Visit
Admission: RE | Admit: 2020-02-21 | Discharge: 2020-02-21 | Disposition: A | Discharge status: Home / Self Care | Payer: No Typology Code available for payment source | Source: Ambulatory Visit | Attending: Urology | Admitting: Urology

## 2020-02-21 ENCOUNTER — Other Ambulatory Visit: Payer: Self-pay

## 2020-02-21 DIAGNOSIS — Z01812 Encounter for preprocedural laboratory examination: Secondary | ICD-10-CM | POA: Insufficient documentation

## 2020-02-21 DIAGNOSIS — Z20822 Contact with and (suspected) exposure to covid-19: Secondary | ICD-10-CM | POA: Diagnosis not present

## 2020-02-21 LAB — SARS CORONAVIRUS 2 (TAT 6-24 HRS): SARS Coronavirus 2: NEGATIVE

## 2020-02-22 MED ORDER — CHLORHEXIDINE GLUCONATE 0.12 % MT SOLN: 15.0000 mL | Freq: Once | OROMUCOSAL | Status: AC

## 2020-02-22 MED ORDER — FAMOTIDINE 20 MG PO TABS: 20.0000 mg | ORAL_TABLET | Freq: Once | ORAL | Status: AC

## 2020-02-22 MED ORDER — LACTATED RINGERS IV SOLN: INTRAVENOUS | Status: DC

## 2020-02-22 MED ORDER — ORAL CARE MOUTH RINSE: 15.0000 mL | Freq: Once | OROMUCOSAL | Status: AC

## 2020-02-22 MED ORDER — CEFAZOLIN SODIUM-DEXTROSE 2-4 GM/100ML-% IV SOLN
2.0000 g | INTRAVENOUS | Status: AC
  Administered 2020-02-23: 2 g via INTRAVENOUS

## 2020-02-23 ENCOUNTER — Ambulatory Visit
Admission: RE | Admit: 2020-02-23 | Discharge: 2020-02-23 | Disposition: A | Discharge status: Home / Self Care | Payer: No Typology Code available for payment source | Attending: Urology | Admitting: Urology

## 2020-02-23 ENCOUNTER — Other Ambulatory Visit: Payer: Self-pay

## 2020-02-23 ENCOUNTER — Encounter
Admission: RE | Disposition: A | Discharge status: Home / Self Care | Payer: Self-pay | Source: Home / Self Care | Attending: Urology

## 2020-02-23 ENCOUNTER — Encounter: Payer: Self-pay | Admitting: Urology

## 2020-02-23 ENCOUNTER — Ambulatory Visit: Payer: No Typology Code available for payment source | Admitting: Anesthesiology

## 2020-02-23 DIAGNOSIS — Z8614 Personal history of Methicillin resistant Staphylococcus aureus infection: Secondary | ICD-10-CM | POA: Insufficient documentation

## 2020-02-23 DIAGNOSIS — M1990 Unspecified osteoarthritis, unspecified site: Secondary | ICD-10-CM | POA: Insufficient documentation

## 2020-02-23 DIAGNOSIS — N4283 Cyst of prostate: Secondary | ICD-10-CM | POA: Diagnosis not present

## 2020-02-23 DIAGNOSIS — K760 Fatty (change of) liver, not elsewhere classified: Secondary | ICD-10-CM | POA: Diagnosis not present

## 2020-02-23 DIAGNOSIS — Z86718 Personal history of other venous thrombosis and embolism: Secondary | ICD-10-CM | POA: Insufficient documentation

## 2020-02-23 DIAGNOSIS — Z86711 Personal history of pulmonary embolism: Secondary | ICD-10-CM | POA: Insufficient documentation

## 2020-02-23 DIAGNOSIS — G4730 Sleep apnea, unspecified: Secondary | ICD-10-CM | POA: Diagnosis not present

## 2020-02-23 DIAGNOSIS — J45909 Unspecified asthma, uncomplicated: Secondary | ICD-10-CM | POA: Diagnosis not present

## 2020-02-23 DIAGNOSIS — N3080 Other cystitis without hematuria: Secondary | ICD-10-CM | POA: Insufficient documentation

## 2020-02-23 DIAGNOSIS — I2699 Other pulmonary embolism without acute cor pulmonale: Secondary | ICD-10-CM | POA: Diagnosis not present

## 2020-02-23 DIAGNOSIS — R300 Dysuria: Secondary | ICD-10-CM | POA: Diagnosis not present

## 2020-02-23 HISTORY — PX: TRANSURETHRAL RESECTION OF PROSTATE: SHX73

## 2020-02-23 SURGERY — TURP (TRANSURETHRAL RESECTION OF PROSTATE)
Anesthesia: General

## 2020-02-23 MED ORDER — ONDANSETRON HCL 4 MG/2ML IJ SOLN
INTRAMUSCULAR | Status: DC | PRN
  Administered 2020-02-23: 4 mg via INTRAVENOUS

## 2020-02-23 MED ORDER — SUGAMMADEX SODIUM 200 MG/2ML IV SOLN
INTRAVENOUS | Status: DC | PRN
  Administered 2020-02-23 (×2): 200 mg via INTRAVENOUS

## 2020-02-23 MED ORDER — SUCCINYLCHOLINE CHLORIDE 20 MG/ML IJ SOLN
INTRAMUSCULAR | Status: DC | PRN
  Administered 2020-02-23: 100 mg via INTRAVENOUS

## 2020-02-23 MED ORDER — PROPOFOL 10 MG/ML IV BOLUS
INTRAVENOUS | Status: DC | PRN
  Administered 2020-02-23: 180 mg via INTRAVENOUS

## 2020-02-23 MED ORDER — IPRATROPIUM-ALBUTEROL 0.5-2.5 (3) MG/3ML IN SOLN: 3.0000 mL | Freq: Once | RESPIRATORY_TRACT | Status: AC

## 2020-02-23 MED ORDER — FENTANYL CITRATE (PF) 100 MCG/2ML IJ SOLN: 25.0000 ug | INTRAMUSCULAR | Status: DC | PRN

## 2020-02-23 MED ORDER — ONDANSETRON HCL 4 MG/2ML IJ SOLN: 4.0000 mg | Freq: Once | INTRAMUSCULAR | Status: DC | PRN

## 2020-02-23 MED ORDER — ROCURONIUM BROMIDE 100 MG/10ML IV SOLN
INTRAVENOUS | Status: DC | PRN
  Administered 2020-02-23: 40 mg via INTRAVENOUS
  Administered 2020-02-23: 10 mg via INTRAVENOUS

## 2020-02-23 MED ORDER — FENTANYL CITRATE (PF) 100 MCG/2ML IJ SOLN
INTRAMUSCULAR | Status: AC
  Filled 2020-02-23: qty 2

## 2020-02-23 MED ORDER — LIDOCAINE HCL (CARDIAC) PF 100 MG/5ML IV SOSY
PREFILLED_SYRINGE | INTRAVENOUS | Status: DC | PRN
  Administered 2020-02-23: 100 mg via INTRAVENOUS

## 2020-02-23 MED ORDER — CHLORHEXIDINE GLUCONATE 0.12 % MT SOLN
OROMUCOSAL | Status: AC
  Administered 2020-02-23: 15 mL via OROMUCOSAL
  Filled 2020-02-23: qty 15

## 2020-02-23 MED ORDER — IPRATROPIUM-ALBUTEROL 0.5-2.5 (3) MG/3ML IN SOLN
RESPIRATORY_TRACT | Status: AC
  Administered 2020-02-23: 3 mL via RESPIRATORY_TRACT
  Filled 2020-02-23: qty 3

## 2020-02-23 MED ORDER — DEXAMETHASONE SODIUM PHOSPHATE 10 MG/ML IJ SOLN
INTRAMUSCULAR | Status: DC | PRN
  Administered 2020-02-23: 10 mg via INTRAVENOUS

## 2020-02-23 MED ORDER — CEFAZOLIN SODIUM-DEXTROSE 2-4 GM/100ML-% IV SOLN
INTRAVENOUS | Status: AC
  Filled 2020-02-23: qty 100

## 2020-02-23 MED ORDER — FAMOTIDINE 20 MG PO TABS
ORAL_TABLET | ORAL | Status: AC
  Administered 2020-02-23: 20 mg via ORAL
  Filled 2020-02-23: qty 1

## 2020-02-23 MED ORDER — HYDROCODONE-ACETAMINOPHEN 5-325 MG PO TABS
1.0000 | ORAL_TABLET | ORAL | 0 refills | Status: AC | PRN
Start: 2020-02-23 — End: 2020-02-26

## 2020-02-23 MED ORDER — FENTANYL CITRATE (PF) 100 MCG/2ML IJ SOLN
INTRAMUSCULAR | Status: DC | PRN
  Administered 2020-02-23 (×2): 50 ug via INTRAVENOUS

## 2020-02-23 SURGICAL SUPPLY — 28 items
ADAPTER IRRIG TUBE 2 SPIKE SOL (ADAPTER) ×4 IMPLANT
ADPR TBG 2 SPK PMP STRL ASCP (ADAPTER) ×2
BAG DRAIN CYSTO-URO LG1000N (MISCELLANEOUS) ×2 IMPLANT
BAG DRN LRG CPC RND TRDRP CNTR (MISCELLANEOUS) ×1
BAG URO DRAIN 4000ML (MISCELLANEOUS) ×2 IMPLANT
CATH FOLEY 3WAY 30CC 22FR (CATHETERS) IMPLANT
DRAPE UTILITY 15X26 TOWEL STRL (DRAPES) ×2 IMPLANT
ELECT BIVAP BIPO 22/24 DONUT (ELECTROSURGICAL)
ELECT LOOP 22F BIPOLAR SML (ELECTROSURGICAL) ×2
ELECTRD BIVAP BIPO 22/24 DONUT (ELECTROSURGICAL) IMPLANT
ELECTRODE LOOP 22F BIPOLAR SML (ELECTROSURGICAL) IMPLANT
GLOVE BIOGEL PI IND STRL 7.5 (GLOVE) ×1 IMPLANT
GLOVE BIOGEL PI INDICATOR 7.5 (GLOVE) ×1
GOWN STRL REUS W/ TWL LRG LVL3 (GOWN DISPOSABLE) ×1 IMPLANT
GOWN STRL REUS W/ TWL XL LVL3 (GOWN DISPOSABLE) ×1 IMPLANT
GOWN STRL REUS W/TWL LRG LVL3 (GOWN DISPOSABLE) ×2
GOWN STRL REUS W/TWL XL LVL3 (GOWN DISPOSABLE) ×2
HOLDER FOLEY CATH W/STRAP (MISCELLANEOUS) ×2 IMPLANT
KIT TURNOVER CYSTO (KITS) ×2 IMPLANT
LOOP CUT BIPOLAR 24F LRG (ELECTROSURGICAL) IMPLANT
PACK CYSTO AR (MISCELLANEOUS) ×2 IMPLANT
SET IRRIG Y TYPE TUR BLADDER L (SET/KITS/TRAYS/PACK) ×2 IMPLANT
SOL .9 NS 3000ML IRR  AL (IV SOLUTION) ×12
SOL .9 NS 3000ML IRR AL (IV SOLUTION) ×6
SOL .9 NS 3000ML IRR UROMATIC (IV SOLUTION) ×6 IMPLANT
SYR TOOMEY IRRIG 70ML (MISCELLANEOUS) ×2
SYRINGE TOOMEY IRRIG 70ML (MISCELLANEOUS) ×1 IMPLANT
WATER STERILE IRR 1000ML POUR (IV SOLUTION) ×2 IMPLANT

## 2020-02-23 NOTE — H&P (Signed)
02/23/20 10:18 AM   Anthony [name] 22-Aug-1965 [id number]  CC: Dysuria  HPI: Anthony [name] is a 54 year old male with a long history of dysuria who was ultimately found to have a 1 cm central prostatic cyst that projected into the lumen on cystoscopy, and also could be seen on CT.  PSA has been normal.  PMH: Past Medical History:  Diagnosis Date  . Anemia   . Anxiety   . Asthma   . Cataract   . Complication of anesthesia    HARD TO WAKE UP AFTER 2ND EYE SURGERY  . Depression   . DVT (deep venous thrombosis) ([facility])    DUE TO A LONG TRAVEL  . Fatty liver   . Genital herpes 05/06/2018  . H/O renal calculi 2006  . Headache    MIGRAINES  . History of kidney stones    H/O  . History of methicillin resistant staphylococcus aureus (MRSA)   . Hyperlipidemia   . Internal hemorrhoid   . PE (pulmonary embolism)   . Peptic ulcer disease   . Pneumonia    h/o  . PTSD (post-traumatic stress disorder)   . Sleep apnea    DOES NOT USE CPAP CURRENTLY-MASK DOES NOT FIT CORRECTLY [facility] (02-13-20)  . Spondylosis   . Tubular adenoma of colon 2011   Dr Olevia Perches    Surgical History: Past Surgical History:  Procedure Laterality Date  . CATARACT EXTRACTION Left 1996   Dr Elonda Husky; [facility]  . colonoscopy with polypectomy  2011   Dr Olevia Perches, hemorrhoids  . CYSTOSCOPY W/ STONE MANIPULATION  2006  . EYE SURGERY Left   . INGUINAL HERNIA REPAIR Right 1990  . SHOULDER SURGERY Right 12/17/2010   Dr French Ana ; shoulder impingement & torn tendon    Family History: Family History  Problem Relation Age of Onset  . Heart attack Mother 49  . Stroke Father 42  . Diabetes Father        PVD  . Glaucoma Father        blindness  . Cancer Paternal Grandmother        ? primary  . Heart attack Maternal Uncle         X2,pre 69  . Heart attack Maternal Grandmother 73  . Asthma Neg Hx   . COPD Neg Hx     Social History:  reports that he has never smoked. He has never used  smokeless tobacco. He reports current alcohol use. He reports that he does not use drugs.  Physical Exam: BP 106/76   Pulse 76   Temp (!) 97.4 F (36.3 C) (Temporal)   Resp 18   Ht 5\' 7"  (1.702 m)   Wt 93.4 kg   SpO2 100%   BMI 32.26 kg/m    Constitutional:  Alert and oriented, No acute distress. Cardiovascular: Regular rate and rhythm Respiratory: Clear to auscultation bilaterally GI: Abdomen is soft, nontender, nondistended, no abdominal masses  Laboratory Data: Urine culture 02/12/2020 no growth.  Assessment & Plan:   54 year old male with a 1 cm prostatic cyst seen on CT, and confirmed on cystoscopy projecting into the urethral lumen that is the likely etiology of his persistent dysuria despite multiple negative urine cultures and negative STD testing.  We discussed the risks of this procedure including bleeding, infection, postoperative pain, persistent dysuria of other etiology, and recurrence.  We also discussed possible alternative pathology on TUR specimen that could change management in the future.  We discussed possible need for temporary catheter placement.  Proceed with TURP of prostatic cyst  [name], MD 02/23/2020  [facility] [address], [place], [place] [address] [phone]

## 2020-02-23 NOTE — Anesthesia Procedure Notes (Signed)
Procedure Name: Intubation Date/Time: 02/23/2020 10:56 AM Performed by: Nelda Marseille, CRNA Pre-anesthesia Checklist: Patient identified, Patient being monitored, Timeout performed, Emergency Drugs available and Suction available Patient Re-evaluated:Patient Re-evaluated prior to induction Oxygen Delivery Method: Circle system utilized Preoxygenation: Pre-oxygenation with 100% oxygen Induction Type: IV induction Ventilation: Mask ventilation without difficulty Laryngoscope Size: Mac, 3 and McGraph Grade View: Grade I Tube type: Oral Tube size: 7.0 mm Number of attempts: 1 Airway Equipment and Method: Stylet and Video-laryngoscopy Placement Confirmation: ETT inserted through vocal cords under direct vision,  positive ETCO2 and breath sounds checked- equal and bilateral Secured at: 21 cm Tube secured with: Tape Dental Injury: Teeth and Oropharynx as per pre-operative assessment

## 2020-02-23 NOTE — Discharge Instructions (Signed)
AMBULATORY SURGERY  °DISCHARGE INSTRUCTIONS ° ° °1) The drugs that you were given will stay in your system until tomorrow so for the next 24 hours you should not: ° °A) Drive an automobile °B) Make any legal decisions °C) Drink any alcoholic beverage ° ° °2) You may resume regular meals tomorrow.  Today it is better to start with liquids and gradually work up to solid foods. ° °You may eat anything you prefer, but it is better to start with liquids, then soup and crackers, and gradually work up to solid foods. ° ° °3) Please notify your doctor immediately if you have any unusual bleeding, trouble breathing, redness and pain at the surgery site, drainage, fever, or pain not relieved by medication. ° ° ° °4) Additional Instructions: ° ° ° ° ° ° ° °Please contact your physician with any problems or Same Day Surgery at [phone], Monday through Friday 6 am to 4 pm, or [place] [facility] [place] Main number at [phone]. °

## 2020-02-23 NOTE — Op Note (Signed)
Date of procedure: 02/23/20  Preoperative diagnosis:  1. Prostate cyst 2. Dysuria  Postoperative diagnosis:  1. Same  Procedure: 1. Transurethral resection of prostate cyst  Surgeon: Anthony Madrid, MD  Anesthesia: General  Complications: None  Intraoperative findings:  1.  1 cm cystic lesion spanning from the verumontanum to the bladder neck, resected entirely and sent for pathology 2.  Excellent hemostasis 3.  Bladder grossly normal  EBL: Minimal  Specimens: Prostate cyst  Drains: None  Indication: Anthony [name] is a 54 y.o. patient with ongoing dysuria who was found to have a 1 cm cyst on CT and confirmed on cystoscopy within the prostatic urethra and elected for removal.  After reviewing the management options for treatment, they elected to proceed with the above surgical procedure(s). We have discussed the potential benefits and risks of the procedure, side effects of the proposed treatment, the likelihood of the patient achieving the goals of the procedure, and any potential problems that might occur during the procedure or recuperation. Informed consent has been obtained.  Description of procedure:  The patient was taken to the operating room and general anesthesia was induced. SCDs were placed for DVT prophylaxis. The patient was placed in the dorsal lithotomy position, prepped and draped in the usual sterile fashion, and preoperative antibiotics were administered. A preoperative time-out was performed.   [name] sounds were used to gently dilate the urethra to 28 [place].  The 26 French resectoscope was inserted using the visual obturator and a normal-appearing urethra was followed proximally in the bladder.  The prostate was small and there was a 1 cm white-colored cyst from the verumontanum to the bladder neck.  Photos were taken.  The resecting loop was used to unroof the cyst and there was no purulence noted.  Fulguration was used at the base of the cyst and there was  excellent hemostasis.  The bladder was drained and this concluded our procedure  Disposition: Stable to PACU  Plan: Follow-up pathology Must void prior to discharge  Anthony Madrid, MD

## 2020-02-23 NOTE — Anesthesia Preprocedure Evaluation (Addendum)
Anesthesia Evaluation  Patient identified by MRN, date of birth, ID band Patient awake    Reviewed: Allergy & Precautions, NPO status , Patient's Chart, lab work & pertinent test results  History of Anesthesia Complications (+) PROLONGED EMERGENCE and history of anesthetic complications  Airway Mallampati: II  TM Distance: >3 FB     Dental  (+) Caps   Pulmonary shortness of breath and with exertion, asthma , sleep apnea , PE   Pulmonary exam normal        Cardiovascular + DVT  Normal cardiovascular exam     Neuro/Psych  Headaches, PSYCHIATRIC DISORDERS Anxiety Depression    GI/Hepatic PUD,   Endo/Other  negative endocrine ROS  Renal/GU Stones      Musculoskeletal  (+) Arthritis , Osteoarthritis,    Abdominal Normal abdominal exam  (+)   Peds negative pediatric ROS (+)  Hematology  (+) anemia ,   Anesthesia Other Findings Past Medical History: No date: Anemia No date: Anxiety No date: Asthma No date: Cataract No date: Complication of anesthesia     Comment:  HARD TO WAKE UP AFTER 2ND EYE SURGERY No date: Depression No date: DVT (deep venous thrombosis) (HCC)     Comment:  DUE TO A LONG TRAVEL No date: Fatty liver 05/06/2018: Genital herpes 2006: H/O renal calculi No date: Headache     Comment:  MIGRAINES No date: History of kidney stones     Comment:  H/O No date: History of methicillin resistant staphylococcus aureus (MRSA) No date: Hyperlipidemia No date: Internal hemorrhoid No date: PE (pulmonary embolism) No date: Peptic ulcer disease No date: Pneumonia     Comment:  h/o No date: PTSD (post-traumatic stress disorder) No date: Sleep apnea     Comment:  DOES NOT USE CPAP CURRENTLY-MASK DOES NOT FIT CORRECTLY               [facility]) No date: Spondylosis 2011: Tubular adenoma of colon     Comment:  Dr Olevia Perches  Reproductive/Obstetrics                             Anesthesia Physical Anesthesia Plan  ASA: III  Anesthesia Plan: General   Post-op Pain Management:    Induction: Intravenous  PONV Risk Score and Plan:   Airway Management Planned: Oral ETT  Additional Equipment:   Intra-op Plan:   Post-operative Plan: Extubation in OR  Informed Consent: I have reviewed the patients History and Physical, chart, labs and discussed the procedure including the risks, benefits and alternatives for the proposed anesthesia with the patient or authorized representative who has indicated his/her understanding and acceptance.     Dental advisory given  Plan Discussed with: CRNA and Surgeon  Anesthesia Plan Comments:         Anesthesia Quick Evaluation

## 2020-02-23 NOTE — OR Nursing (Signed)
Per Dr. Diamantina Providence, secure-chat, patient may resume aspirin on Monday.  Added to discharge instructions/med section.

## 2020-02-23 NOTE — Transfer of Care (Signed)
Immediate Anesthesia Transfer of Care Note  Patient: ARTY LANTZY  Procedure(s) Performed: TRANSURETHRAL RESECTION OF THE PROSTATE (TURP) (N/A )  Patient Location: PACU  Anesthesia Type:General  Level of Consciousness: sedated  Airway & Oxygen Therapy: Patient Spontanous Breathing and Patient connected to face mask oxygen  Post-op Assessment: Report given to RN and Post -op Vital signs reviewed and stable  Post vital signs: Reviewed and stable  Last Vitals:  Vitals Value Taken Time  BP 126/85 02/23/20 1117  Temp 36.1 C 02/23/20 1117  Pulse 98 02/23/20 1118  Resp 14 02/23/20 1118  SpO2 100 % 02/23/20 1118  Vitals shown include unvalidated device data.  Last Pain:  Vitals:   02/23/20 1117  TempSrc:   PainSc: 0-No pain         Complications: No complications documented.

## 2020-02-26 ENCOUNTER — Encounter: Payer: Self-pay | Admitting: Urology

## 2020-02-26 NOTE — Anesthesia Postprocedure Evaluation (Signed)
Anesthesia Post Note  Patient: Anthony [name]  Procedure(s) Performed: TRANSURETHRAL RESECTION OF THE PROSTATE (TURP) (N/A )  Patient location during evaluation: PACU Anesthesia Type: General Level of consciousness: awake and alert Pain management: pain level controlled Vital Signs Assessment: post-procedure vital signs reviewed and stable Respiratory status: spontaneous breathing, nonlabored ventilation and respiratory function stable Cardiovascular status: blood pressure returned to baseline and stable Postop Assessment: no apparent nausea or vomiting Anesthetic complications: no   No complications documented.   Last Vitals:  Vitals:   02/23/20 1206 02/23/20 1304  BP: (!) 128/97 (!) 128/93  Pulse: 76 75  Resp: 16 16  Temp: (!) 36.3 C   SpO2: 100% 100%    Last Pain:  Vitals:   02/24/20 1302  TempSrc:   PainSc: [place]

## 2020-02-27 ENCOUNTER — Telehealth: Payer: Self-pay

## 2020-02-27 DIAGNOSIS — H527 Unspecified disorder of refraction: Secondary | ICD-10-CM | POA: Diagnosis not present

## 2020-02-27 DIAGNOSIS — H57812 Brow ptosis, left: Secondary | ICD-10-CM | POA: Diagnosis not present

## 2020-02-27 DIAGNOSIS — H269 Unspecified cataract: Secondary | ICD-10-CM | POA: Diagnosis not present

## 2020-02-27 DIAGNOSIS — H04123 Dry eye syndrome of bilateral lacrimal glands: Secondary | ICD-10-CM | POA: Diagnosis not present

## 2020-02-27 LAB — SURGICAL PATHOLOGY

## 2020-02-27 NOTE — Telephone Encounter (Signed)
-----   Message from Billey Co, MD sent at 02/27/2020 12:27 PM EDT ----- Good news, no cancer seen on prostate specimen. Keep follow up as scheduled.  [name], MD 02/27/2020

## 2020-03-05 ENCOUNTER — Ambulatory Visit (INDEPENDENT_AMBULATORY_CARE_PROVIDER_SITE_OTHER): Payer: No Typology Code available for payment source | Admitting: Urology

## 2020-03-05 ENCOUNTER — Encounter: Payer: Self-pay | Admitting: Urology

## 2020-03-05 ENCOUNTER — Other Ambulatory Visit: Payer: Self-pay

## 2020-03-05 ENCOUNTER — Other Ambulatory Visit
Admission: RE | Admit: 2020-03-05 | Discharge: 2020-03-05 | Disposition: A | Discharge status: Home / Self Care | Payer: No Typology Code available for payment source | Attending: Urology | Admitting: Urology

## 2020-03-05 VITALS — BP 120/88 | HR 56 | Ht 66.0 in | Wt 206.0 lb

## 2020-03-05 DIAGNOSIS — R3989 Other symptoms and signs involving the genitourinary system: Secondary | ICD-10-CM | POA: Insufficient documentation

## 2020-03-05 DIAGNOSIS — N4283 Cyst of prostate: Secondary | ICD-10-CM

## 2020-03-05 LAB — URINALYSIS, COMPLETE (UACMP) WITH MICROSCOPIC
Bacteria, UA: NONE SEEN
Bilirubin Urine: NEGATIVE
Glucose, UA: NEGATIVE mg/dL
Ketones, ur: NEGATIVE mg/dL
Leukocytes,Ua: NEGATIVE
Nitrite: NEGATIVE
RBC / HPF: >50 RBC/hpf (ref 0–5)
Specific Gravity, Urine: 1.025 (ref 1.005–1.030)
Squamous Epithelial / HPF: NONE SEEN (ref 0–5)
pH: 6.5 (ref 5.0–8.0)

## 2020-03-05 NOTE — Progress Notes (Signed)
   03/05/2020 1:50 PM   Anthony [name] 1965-08-31 [id number]  Reason for visit: Follow up TUR prostate cyst  HPI: [name] was added to my schedule today for post op concerns of gross hematuria and dysuria. He is s/p transurethral resection of a 1cm prostatic cyst on 10/22. Pathology showed no evidence of malignancy, and he was discharged without a foley. He denies any significant pain since surgery and has not required any narcotics. He has taken a few pyridium. He has noticed some pink urine at the beginning of his stream.   UA today benign aside from microscopic hematuria.  Reassurance provided at length. I assured him that occasional gross hematuria and dysuria will be normal up to 4-6 weeks after surgery. RTC 6 months for symptom check   Billey Co, MD  [facility] [address] [place], [place] [address] [phone]

## 2020-03-07 ENCOUNTER — Ambulatory Visit: Payer: Federal, State, Local not specified - PPO | Admitting: Urology

## 2020-03-15 ENCOUNTER — Encounter: Payer: Self-pay | Admitting: Nurse Practitioner

## 2020-03-15 ENCOUNTER — Ambulatory Visit: Payer: Federal, State, Local not specified - PPO | Admitting: Nurse Practitioner

## 2020-03-15 VITALS — BP 100/68 | HR 112 | Ht 66.75 in | Wt 211.5 lb

## 2020-03-15 DIAGNOSIS — D126 Benign neoplasm of colon, unspecified: Secondary | ICD-10-CM | POA: Diagnosis not present

## 2020-03-15 DIAGNOSIS — K625 Hemorrhage of anus and rectum: Secondary | ICD-10-CM

## 2020-03-15 MED ORDER — PLENVU 140 G PO SOLR: 140.0000 g | ORAL | 0 refills | Status: DC

## 2020-03-15 NOTE — Patient Instructions (Signed)
If you are age 53 or older, your body mass index should be between 23-30. Your Body mass index is 33.37 kg/m. If this is out of the aforementioned range listed, please consider follow up with your Primary Care Provider.  If you are age 55 or younger, your body mass index should be between 19-25. Your Body mass index is 33.37 kg/m. If this is out of the aformentioned range listed, please consider follow up with your Primary Care Provider.   You have been scheduled for a colonoscopy. Please follow written instructions given to you at your visit today.  Please pick up your prep supplies at the pharmacy within the next 1-3 days. If you use inhalers (even only as needed), please bring them with you on the day of your procedure.  Miralax at bedtime as needed.   Call the office if bleeding reoccurs.  It was a pleasure to see you today!  Anthony [name]

## 2020-03-15 NOTE — Progress Notes (Signed)
03/15/2020 Anthony [name] [id number] Aug 16, 1965   Chief Complaint: Blood in stool   History of Present Illness: [name]. [name] is a 54 year old male with a past medical history of anxiety, depression, asthma, sleep apnea, migraine headaches, kidney stones, anemia, GI bleed 04/03/2015, colon polyps, LLE DVT 04/26/2015 treated with ASA 325mg  and PPI bid then developed a small right lower lobe PE 05/02/2015 treated with Eliquis and ASA was stopped. Since then, Eliquis was discontinued and he remains on ASA 81mg  QD. He reports having a history of prolonged sedation, difficulty waking up following his eye surgery 04/17/2015.    He is followed by Dr. Loletha Carrow. He was last seen in office by Alonza Bogus PA-C on 05/16/2019 with complaints of RUQ, LUQ and RLQ abdominal pain. An abd/pelvic CT was done 05/19/2019 which showed small right kidney cysts, distal diverticulosis without evidence of diverticulitis and a small umbilical hernia containing fat was identified. He was advised to [facility] and his symptoms improved.   He presents today with complaints of seeing red blood on his stool on and off during October 2021 without any further rectal bleeding for the past week. No melena. No associated rectal or abdominal pain. He has intermittent constipation and he takes Miralax as needed. He is s/p TURP due to having a 1cm prostate cyst on 02/23/2020 by Dr. Diamantina Providence.  His most recent colonoscopy was 04/04/2015 by Dr. Shary Key while in the hospital for a GI bleed which showed internal hemorrhoids. He underwent a colonoscopy by Dr. Olevia Perches 05/30/2009 and a 72mm tubular adenomatous polyp was removed from the sigmoid colon. No family history of colon cancer.   He has intermittent RUQ pain, had one episode during the middle of the night 1 or 2 weeks ago. No associated nausea or vomiting. No heartburn or dysphagia. No LUQ pian. An abdominal sonogram 02/18/2018 showed a positive sonographic Murphy's sign without evidence  of gallstones on cholecystitis. He underwent an EGD 04/03/15 when he was in the hospital by Dr. Shary Key due to having melena which showed a clean based 1cm duodenal ulcer without stigmata of recent bleeding and duodenitis in setting of NSAID use. Biopsies showed focal chronic and active inflammation, no evidence of H. Pylori. He is on ASA, no other NSAIDs.   CBC Latest Ref Rng & Units 02/20/2020 01/30/2020 05/11/2019  WBC 4.0 - 10.5 K/uL 5.9 5.5 5.4  Hemoglobin 13.0 - 17.0 g/dL 14.3 14.9 14.7  Hematocrit 39 - 52 % 43.3 44.9 44.3  Platelets 150 - 400 K/uL 256.0 258 237.0    CMP Latest Ref Rng & Units 11/08/2019 09/04/2019 05/11/2019  Glucose 65 - 99 mg/dL 91 82 95  BUN 7 - 25 mg/dL 9 11 11   Creatinine 0.70 - 1.33 mg/dL 0.91 1.02 0.95  Sodium 135 - 146 mmol/L 139 137 139  Potassium 3.5 - 5.3 mmol/L 4.4 4.2 4.4  Chloride 98 - 110 mmol/L 106 102 106  CO2 20 - 32 mmol/L 27 28 27   Calcium 8.6 - 10.3 mg/dL 9.6 9.4 9.8  Total Protein 6.1 - 8.1 g/dL 6.8 - 7.1  Total Bilirubin 0.2 - 1.2 mg/dL 0.5 - 0.6  Alkaline Phos 39 - 117 U/L - - 73  AST 10 - 35 U/L 20 - 26  ALT 9 - 46 U/L 27 - 42    Current Outpatient Medications on File Prior to Visit  Medication Sig Dispense Refill  . acetaminophen (TYLENOL) 500 MG tablet Take 1,000 mg by mouth every 6 (  six) hours as needed for moderate pain.    Marland Kitchen albuterol (PROVENTIL HFA;VENTOLIN HFA) 108 (90 Base) MCG/ACT inhaler Inhale 2 puffs into the lungs every 6 (six) hours as needed. For shortness of breath 1 Inhaler 1  . aspirin EC 81 MG tablet Take 81 mg by mouth daily.    . Brinzolamide-Brimonidine 1-0.2 % SUSP Place 1 drop into the left eye 2 (two) times daily.     . Cholecalciferol (VITAMIN D3 PO) Take 1 tablet by mouth daily.    . cyclobenzaprine (FLEXERIL) 5 MG tablet Take 1 tablet (5 mg total) by mouth 3 (three) times daily as needed for muscle spasms. 30 tablet 3  . diclofenac sodium (VOLTAREN) 1 % GEL Apply 4 g topically 4 (four) times daily as needed. 400 g 2   . fluticasone (FLONASE) 50 MCG/ACT nasal spray Place 2 sprays into both nostrils daily as needed for allergies. 16 g 3  . metaxalone (SKELAXIN) 800 MG tablet Take 800 mg by mouth 3 (three) times daily as needed.     . methocarbamol (ROBAXIN) 500 MG tablet Take 500 mg by mouth 3 (three) times daily.     . Multiple Vitamin (MULTIVITAMIN) tablet Take 1 tablet by mouth daily.    . OXcarbazepine (TRILEPTAL) 150 MG tablet Take 150 mg by mouth as needed (FOR ANXIETY/PTSD).    Marland Kitchen PARoxetine (PAXIL) 10 MG tablet Take 10 mg by mouth daily as needed (anxiety).     . polyethylene glycol powder (GLYCOLAX/MIRALAX) 17 GM/SCOOP powder Take 17 g by mouth 2 (two) times daily as needed. 3350 g 1  . SYMBICORT 160-4.5 MCG/ACT inhaler Inhale 2 puffs into the lungs in the morning and at bedtime.     . topiramate (TOPAMAX) 25 MG tablet topiramate 25 mg tablet (Patient not taking: Reported on 02/08/2020)    . valACYclovir (VALTREX) 1000 MG tablet TAKE 1/2 TABLET BY MOUTH TWICE DAILY FOR 3 DAYS OR 1 TABLET BY MOUTH EVERY DAY FOR 5 DAYS AT ONSET OF BREAKOUT (Patient not taking: Reported on 02/08/2020) 90 tablet 1   No current facility-administered medications on file prior to visit.   Allergies  Allergen Reactions  . Aleve [Naproxen] Rash    Patient stated it was years ago  . Nsaids Other (See Comments)    Bleeding ulcers    Current Medications, Allergies, Past Medical History, Past Surgical History, Family History and Social History were reviewed in [employer] record.   Review of Systems:   Constitutional: Negative for fever, sweats, chills or weight loss.  Respiratory: Negative for shortness of breath.   Cardiovascular: Negative for chest pain, palpitations and leg swelling.  Gastrointestinal: See HPI.  Musculoskeletal: Negative for back pain or muscle aches.  Neurological: Negative for dizziness, headaches or paresthesias.    Physical Exam: Ht 5' 6.75" (1.695 m) Comment: height  measured without shoes  Wt 211 lb 8 oz (95.9 kg)   BMI 33.37 kg/m   BP 100/68 (BP Location: Left Arm, Patient Position: Sitting, Cuff Size: Normal)   Pulse (!) 112   Ht 5' 6.75" (1.695 m) Comment: height measured without shoes  Wt 211 lb 8 oz (95.9 kg)   BMI 33.37 kg/m   General: Well developed 54 year old male in no acute distress. Head: Normocephalic and atraumatic. Eyes: No scleral icterus. Conjunctiva pink . Ears: Normal auditory acuity. Mouth: Dentition intact. No ulcers or lesions.  Lungs: Clear throughout to auscultation. Heart: HR 88 per auscultation. Regular rate and rhythm, no murmur.  Abdomen: Soft, nontender and nondistended. No masses or hepatomegaly. Normal bowel sounds x 4 quadrants.  Rectal: No external hemorrhoids or anal fissure. Small internal hemorrhoids without prolapse. No blood or stool in the rectal vault. CMA Sophia present during exam. Musculoskeletal: Symmetrical with no gross deformities. Extremities: No edema. Neurological: Alert oriented x 4. No focal deficits.  Psychological: Alert and cooperative. Normal mood and affect  Assessment and Recommendations:  54. 54 year old male with rectal bleeding. Small internal hemorrhoids.  -Miralax Q HS -Apply a small amount of Desitin inside the anal opening and to the external anal area tid as needed for anal or hemorrhoidal irritation/bleeding.  -Colonoscopy benefits and risks discussed including risk with sedation, risk of bleeding, perforation and infection. Colonoscopy to be scheduled in 6 to 8 weeks as he his s/p TURP.  -Patient to call our office if rectal bleeding recurs.   2. History of a tubular adenomatous colon polyp in 2011. No polyps per colonoscopy in 2016.  -See plan in # 1  3. RUQ pain x 1 night one week ago. No current pain. History of GI bleed/melena thought to be due to a duodenal ulcer secondary to NSAID use in 2016. No longer taking PPI. No active GERD symptoms.  -Recommend PPI if RUQ pain  recurs  -Consider EGD at time of colonoscopy if RUQ pain recurs, further recommendations per Dr. Loletha Carrow  -Consider repeat abdominal sonogram if RUQ pain recurs   4. DVT/PE 2016

## 2020-03-19 NOTE — Progress Notes (Signed)
____________________________________________________________  Attending physician addendum:  Thank you for sending this case to me. I have reviewed the entire note, and agree with the plan.    Kristyl Athens Danis, MD  ____________________________________________________________  

## 2020-04-03 ENCOUNTER — Encounter: Payer: Self-pay | Admitting: Internal Medicine

## 2020-04-03 ENCOUNTER — Ambulatory Visit
Admission: EM | Admit: 2020-04-03 | Discharge: 2020-04-03 | Disposition: A | Discharge status: Home / Self Care | Payer: Federal, State, Local not specified - PPO | Attending: Family Medicine | Admitting: Family Medicine

## 2020-04-03 ENCOUNTER — Telehealth: Payer: Self-pay

## 2020-04-03 DIAGNOSIS — K2100 Gastro-esophageal reflux disease with esophagitis, without bleeding: Secondary | ICD-10-CM | POA: Diagnosis not present

## 2020-04-03 DIAGNOSIS — R079 Chest pain, unspecified: Secondary | ICD-10-CM | POA: Diagnosis not present

## 2020-04-03 MED ORDER — OMEPRAZOLE 20 MG PO CPDR: 20.0000 mg | DELAYED_RELEASE_CAPSULE | Freq: Every day | ORAL | 0 refills | Status: DC

## 2020-04-03 NOTE — Telephone Encounter (Signed)
From what little there is to go by, this sounds like it might be muscular soreness or some nerve irritation and sensitivity.  Pt should consider UC now to rule out heart issue, but o/w try tylenol and consider being seen in office here

## 2020-04-03 NOTE — Discharge Instructions (Signed)
Your EKG was normal today  I think that since your symptoms improve with prilosec, that it is related to reflux.  If you have acute worsening of symptoms, follow up in the ER

## 2020-04-03 NOTE — Telephone Encounter (Signed)
Patient sent a mychart message about stinging in his chest, I called the patient to triage and LVM.  Ainslie Mazurek,cma

## 2020-04-03 NOTE — ED Triage Notes (Signed)
Pt states he had a sharp pain in his chest yesterday.  Took Prilosec and had relief.  Pain improved today but present.  Is reproducible with palpation to chest wall.  Does not hurt if not pressing on chest.

## 2020-04-03 NOTE — ED Provider Notes (Signed)
[facility]    CSN: [id number] Arrival date & time: 04/03/20  1413      History   Chief Complaint Chief Complaint  Patient presents with  . Chest Pain    HPI Anthony [name] is a 54 y.o. male.   Reports that he has been having intermittent chest pain and burning. Reports that he took prilosec with relief. Has hx GERD. Denies SOB, headache, radiating pain, dizziness, changes in vision, other symptoms.  ROS per HPI  The history is provided by the patient.  Chest Pain   Past Medical History:  Diagnosis Date  . Anemia   . Anxiety   . Asthma   . Cataract   . Complication of anesthesia    HARD TO WAKE UP AFTER 2ND EYE SURGERY  . Depression   . DVT (deep venous thrombosis) (HCC)    DUE TO A LONG TRAVEL  . Fatty liver   . Genital herpes 05/06/2018  . H/O renal calculi 2006  . Headache    MIGRAINES  . History of kidney stones    H/O  . History of methicillin resistant staphylococcus aureus (MRSA)   . Hyperlipidemia   . Internal hemorrhoid   . PE (pulmonary embolism)   . Peptic ulcer disease   . Pneumonia    h/o  . PTSD (post-traumatic stress disorder)   . Sleep apnea    DOES NOT USE CPAP CURRENTLY-MASK DOES NOT FIT CORRECTLY SO [facility]13-20)  . Spondylosis   . Tubular adenoma of colon 2011   Dr Olevia Perches    Patient Active Problem List   Diagnosis Date Noted  . Ankle instability 11/23/2019  . Leg cramping 09/04/2019  . Pain in joint of right elbow 08/03/2019  . Lateral epicondylitis of right elbow 08/03/2019  . Nephrolithiasis 05/11/2019  . Dark stools 05/11/2019  . Epigastric pain 05/11/2019  . Other constipation 05/11/2019  . Right sided abdominal pain 05/11/2019  . Paresthesia 04/20/2019  . Depression 01/14/2019  . Left ear hearing loss 01/14/2019  . Low back pain 01/14/2019  . Intractable migraine with aura without status migrainosus 01/07/2019  . Genital herpes 05/06/2018  . STD exposure 05/06/2018  .  Obstructive sleep apnea 02/13/2018  . Diarrhea 01/24/2018  . Hyperglycemia 01/24/2018  . Pelvic pain 07/10/2017  . Exposure to blood or body fluid 07/07/2017  . Trigger thumb of right hand 05/21/2017  . Trigger finger, right middle finger 03/18/2017  . Anxiety 03/18/2017  . Chronic tension-type headache, not intractable 02/12/2017  . Herpes simplex type II infection 12/02/2016  . Dysuria 12/02/2016  . Pain of right thumb 11/13/2016  . Other chest pain 07/27/2016  . Onychomycosis 06/12/2016  . Encounter for well adult exam with abnormal findings 06/12/2016  . Generalized headache 05/15/2016  . Lumbar paraspinal muscle spasm 05/15/2016  . Cervical paraspinal muscle spasm 05/15/2016  . History of DVT (deep vein thrombosis) 11/18/2015  . Elevated PSA 11/18/2015  . Acute pulmonary embolism ([place]) 2016 05/09/2015  . Peptic ulcer 05/09/2015  . Dyspnea 05/02/2015  . DVT (deep venous thrombosis) ([place]) 04/26/2015  . Duodenal ulcer 04/25/2015  . Positive D dimer 04/23/2015  . Internal hemorrhoid, bleeding 04/04/2015  . Acute gastrointestinal hemorrhage 04/03/2015  . Duodenitis 04/03/2015  . Postural dizziness with presyncope 04/03/2015  . Microscopic hematuria 04/03/2015  . Asthma 12/31/2014  . Dislocated IOL (intraocular lens), initial encounter 12/10/2014  . MRSA infection 09/14/2013  . History of methicillin resistant Staphylococcus aureus  infection 02/17/2013  . Spondylosis of lumbar joint 09/28/2012  . Fatty liver disease, nonalcoholic 81/44/8185  . Lumbosacral spondylosis without myelopathy 09/28/2012  . Fatty metamorphosis of liver 09/28/2012  . Atopic dermatitis 05/13/2012  . Seasonal and perennial allergic rhinitis 10/01/2011  . Family history of ischemic heart disease 04/02/2011  . 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/12/2011  . COLONIC POLYPS, HX OF 06/21/2009  . HLD (hyperlipidemia) 02/16/2008  . Asthma, mild persistent 02/16/2008  .  NONSPECIFIC ABNORMAL ELECTROCARDIOGRAM 02/16/2008  . NEPHROLITHIASIS, HX OF 02/16/2008    Past Surgical History:  Procedure Laterality Date  . CATARACT EXTRACTION Left 1996   Dr Elonda Husky; [facility]  . colonoscopy with polypectomy  2011   Dr Olevia Perches, hemorrhoids  . CYSTOSCOPY W/ STONE MANIPULATION  2006  . EYE SURGERY Left   . INGUINAL HERNIA REPAIR Right 1990  . SHOULDER SURGERY Right 12/17/2010   Dr French Ana ; shoulder impingement & torn tendon  . TRANSURETHRAL RESECTION OF PROSTATE N/A 02/23/2020   Procedure: TRANSURETHRAL RESECTION OF THE PROSTATE (TURP);  Surgeon: Billey Co, MD;  Location: [facility] ORS;  Service: Urology;  Laterality: N/A;       Home Medications    Prior to Admission medications   Medication Sig Start Date End Date Taking? Authorizing Provider  acetaminophen (TYLENOL) 500 MG tablet Take 1,000 mg by mouth every 6 (six) hours as needed for moderate pain.   Yes [provider]  albuterol (PROVENTIL HFA;VENTOLIN HFA) 108 (90 Base) MCG/ACT inhaler Inhale 2 puffs into the lungs every 6 (six) hours as needed. For shortness of breath 06/29/16  Yes [place], Betty G, MD  aspirin EC 81 MG tablet Take 81 mg by mouth daily.   Yes [provider]  Brinzolamide-Brimonidine 1-0.2 % SUSP Place 1 drop into the left eye 2 (two) times daily.    Yes [provider]  Cholecalciferol (VITAMIN D3 PO) Take 1 tablet by mouth daily.   Yes [provider]  fluticasone (FLONASE) 50 MCG/ACT nasal spray Place 2 sprays into both nostrils daily as needed for allergies. 04/29/18  Yes Hoyt Koch, MD  metaxalone (SKELAXIN) 800 MG tablet Take 800 mg by mouth 3 (three) times daily as needed.  06/01/19  Yes [provider]  Multiple Vitamin (MULTIVITAMIN) tablet Take 1 tablet by mouth daily.   Yes [provider]  OXcarbazepine (TRILEPTAL) 150 MG tablet Take 150 mg by mouth as needed (FOR ANXIETY/PTSD).   Yes [provider]  PARoxetine  (PAXIL) 10 MG tablet Take 10 mg by mouth daily as needed (anxiety).    Yes [provider]  polyethylene glycol powder (GLYCOLAX/MIRALAX) 17 GM/SCOOP powder Take 17 g by mouth 2 (two) times daily as needed. 05/11/19  Yes Biagio Borg, MD  valACYclovir (VALTREX) 1000 MG tablet TAKE 1/2 TABLET BY MOUTH TWICE DAILY FOR 3 DAYS OR 1 TABLET BY MOUTH EVERY DAY FOR 5 DAYS AT ONSET OF BREAKOUT 12/06/19  Yes Biagio Borg, MD  SYMBICORT 160-4.5 MCG/ACT inhaler Inhale 2 puffs into the lungs in the morning and at bedtime. 10/26/14   [provider]  omeprazole (PRILOSEC) 20 MG capsule Take 1 capsule (20 mg total) by mouth daily. 04/03/20 04/13/20  Faustino Congress, NP  topiramate (TOPAMAX) 25 MG tablet topiramate 25 mg tablet Patient not taking: No sig reported  04/13/20  [provider]    Family History Family History  Problem Relation Age of Onset  . Heart attack Mother 28  .  Stroke Father 27  . Diabetes Father        PVD  . Glaucoma Father        blindness  . Cancer Paternal Grandmother        ? primary  . Heart attack Maternal Uncle         X2,pre 13  . Heart attack Maternal Grandmother 73  . Asthma Neg Hx   . COPD Neg Hx     Social History Social History   Tobacco Use  . Smoking status: Never Smoker  . Smokeless tobacco: Never Used  Vaping Use  . Vaping Use: Never used  Substance Use Topics  . Alcohol use: Yes    Alcohol/week: 0.0 standard drinks    Comment: Socially , < 2X/ month  . Drug use: No     Allergies   Aleve [naproxen] and Nsaids   Review of Systems Review of Systems  Cardiovascular: Positive for chest pain.     Physical Exam Triage Vital Signs ED Triage Vitals  Enc Vitals Group     BP 04/03/20 1420 121/87     Pulse Rate 04/03/20 1420 83     Resp 04/03/20 1420 18     Temp 04/03/20 1420 98.1 F (36.7 C)     Temp Source 04/03/20 1420 Oral     SpO2 04/03/20 1420 97 %     Weight --      Height --      Head Circumference --       Peak Flow --      Pain Score 04/03/20 1422 0     Pain Loc --      Pain Edu? --      Excl. in [place]? --    No data found.  Updated Vital Signs BP 121/87 (BP Location: Left Arm)   Pulse 83   Temp 98.1 F (36.7 C) (Oral)   Resp 18   SpO2 97%       Physical Exam Vitals and nursing note reviewed.  Constitutional:      Appearance: He is well-developed.  HENT:     Head: Normocephalic and atraumatic.  Eyes:     Extraocular Movements: Extraocular movements intact.     Conjunctiva/sclera: Conjunctivae normal.     Pupils: Pupils are equal, round, and reactive to light.  Cardiovascular:     Rate and Rhythm: Regular rhythm. Tachycardia present.     Heart sounds: Normal heart sounds. No murmur heard.   Pulmonary:     Effort: Pulmonary effort is normal. No tachypnea, accessory muscle usage or respiratory distress.     Breath sounds: Normal breath sounds. No stridor. No decreased breath sounds, wheezing, rhonchi or rales.  Chest:     Chest wall: No mass, deformity, tenderness, crepitus or edema. There is no dullness to percussion.  Abdominal:     General: There is no abdominal bruit.     Palpations: Abdomen is soft. There is no fluid wave, hepatomegaly or splenomegaly.     Tenderness: There is no abdominal tenderness.  Musculoskeletal:        General: Normal range of motion.     Cervical back: Normal range of motion and neck supple.  Skin:    General: Skin is warm and dry.     Capillary Refill: Capillary refill takes less than 2 seconds.  Neurological:     General: No focal deficit present.     Mental Status: He is alert and oriented to person, place, and time.  Psychiatric:        Mood and Affect: Mood normal.        Behavior: Behavior normal.      UC Treatments / Results  Labs (all labs ordered are listed, but only abnormal results are displayed) Labs Reviewed - No data to display  EKG   Radiology No results found.  Procedures Procedures (including critical care  time)  Medications Ordered in UC Medications - No data to display  Initial Impression / Assessment and Plan / UC Course  I have reviewed the triage vital signs and the nursing notes.  Pertinent labs & imaging results that were available during my care of the patient were reviewed by me and considered in my medical decision making (see chart for details).     Chest Pain GERD  EKG reassuring today Prescribed prilosec, once daily Likely GERD vs anxiety If symptoms get worse, you develop shortness of breath, follow up with the ER for further evaluation and treatment Final Clinical Impressions(s) / UC Diagnoses   Final diagnoses:  Chest pain, unspecified type  Gastroesophageal reflux disease with esophagitis without hemorrhage     Discharge Instructions     Your EKG was normal today  I think that since your symptoms improve with prilosec, that it is related to reflux.  If you have acute worsening of symptoms, follow up in the ER      ED Prescriptions    Medication Sig Dispense Auth. Provider   omeprazole (PRILOSEC) 20 MG capsule Take 1 capsule (20 mg total) by mouth daily. 30 capsule Faustino Congress, NP     PDMP not reviewed this encounter.   Faustino Congress, NP 04/28/20 [phone]

## 2020-04-13 ENCOUNTER — Encounter: Payer: Self-pay | Admitting: Emergency Medicine

## 2020-04-13 ENCOUNTER — Other Ambulatory Visit: Payer: Self-pay

## 2020-04-13 ENCOUNTER — Ambulatory Visit
Admission: EM | Admit: 2020-04-13 | Discharge: 2020-04-13 | Disposition: A | Discharge status: Home / Self Care | Payer: Federal, State, Local not specified - PPO | Attending: Physician Assistant | Admitting: Physician Assistant

## 2020-04-13 DIAGNOSIS — N5312 Painful ejaculation: Secondary | ICD-10-CM | POA: Insufficient documentation

## 2020-04-13 DIAGNOSIS — R300 Dysuria: Secondary | ICD-10-CM | POA: Diagnosis not present

## 2020-04-13 DIAGNOSIS — R350 Frequency of micturition: Secondary | ICD-10-CM | POA: Insufficient documentation

## 2020-04-13 LAB — URINALYSIS, COMPLETE (UACMP) WITH MICROSCOPIC
Bilirubin Urine: NEGATIVE
Glucose, UA: 100 mg/dL — AB
Hgb urine dipstick: NEGATIVE
Ketones, ur: NEGATIVE mg/dL
Leukocytes,Ua: NEGATIVE
Nitrite: POSITIVE — AB
Protein, ur: NEGATIVE mg/dL
RBC / HPF: NONE SEEN RBC/hpf (ref 0–5)
Specific Gravity, Urine: <1.005 — ABNORMAL LOW (ref 1.005–1.030)
Squamous Epithelial / HPF: NONE SEEN (ref 0–5)
pH: 5.5 (ref 5.0–8.0)

## 2020-04-13 MED ORDER — SULFAMETHOXAZOLE-TRIMETHOPRIM 800-160 MG PO TABS: 1.0000 | ORAL_TABLET | Freq: Two times a day (BID) | ORAL | 0 refills | Status: AC

## 2020-04-13 NOTE — ED Triage Notes (Signed)
Patient in today c/o dysuria x 6 days. Patient states he had sex on Sunday (04/07/20) and the symptoms started then. Patient states he had sex today and his ejaculation was not normal. Patient has a urologist and had a cyst removed from his prostate ~02/23/20. Patient denies discharge from penis.

## 2020-04-13 NOTE — Discharge Instructions (Addendum)
Recommend start Bactrim antibiotic since Doxycycline and Ciprofloxacin were not helpful in the past. Take Bactrim twice a day for 10 days. Continue to push fluids. Recommend no sexual intercourse for at least 7 days. Recommend contact your Urologist in [place] or your [place] Urologist on Monday to schedule appointment for further evaluation. Follow-up pending lab results and with your Urologist as planned.

## 2020-04-13 NOTE — ED Triage Notes (Signed)
Patient has been taking AZO 

## 2020-04-14 ENCOUNTER — Encounter: Payer: Self-pay | Admitting: Internal Medicine

## 2020-04-14 LAB — CHLAMYDIA/NGC RT PCR (ARMC ONLY)
Chlamydia Tr: NOT DETECTED
N gonorrhoeae: NOT DETECTED

## 2020-04-14 NOTE — ED Provider Notes (Signed)
[facility]    CSN: [id number] Arrival date & time: 04/13/20  1519      History   Chief Complaint Chief Complaint  Patient presents with  . Dysuria    HPI ARDELL [name] is a 54 y.o. male.   54 year old male presents with burning sensation when urinating about 6 days ago. Penis and genital area feel "sore" and started around 12/5 after having sexual intercourse. Symptoms continued but mild until today when he had sexual intercourse again and felt that his ejaculation was "not normal" He denies any blood in his urine or semen or any unusual penile discharge. No fever, back pain or abdominal or pelvic pain. He currently has one partner. He has had similar symptoms previously and went to [facility] who discovered a cyst in his prostate which was removed on 02/23/20. He had a follow-up 2 weeks later and felt better but has not had any further follow-up since. He does have an appointment with a Urologist at the [place] in about 2 weeks. He has previously been on Ciprofloxacin and Doxycycline for these symptoms and those medications have not been helpful. He was on Bactrim in the past with some success. He has been taking AZO with minimal relief. He has a history of GI bleed and unable to take NSAIDs. Other chronic health issues include anxiety/PTSD, asthma, history of kidney stones and DVT, hyperlipidemia, migraine headaches, and sleep apnea. Currently on Tripleptal, Paxil, and Symbicort daily and Flonase, Tylenol, Valtrex, Skelaxin, and Albuterol prn.   The history is provided by the patient.    Past Medical History:  Diagnosis Date  . Anemia   . Anxiety   . Asthma   . Cataract   . Complication of anesthesia    HARD TO WAKE UP AFTER 2ND EYE SURGERY  . Depression   . DVT (deep venous thrombosis) (HCC)    DUE TO A LONG TRAVEL  . Fatty liver   . Genital herpes 05/06/2018  . H/O renal calculi 2006  . Headache    MIGRAINES  . History of kidney stones    H/O  . History  of methicillin resistant staphylococcus aureus (MRSA)   . Hyperlipidemia   . Internal hemorrhoid   . PE (pulmonary embolism)   . Peptic ulcer disease   . Pneumonia    h/o  . PTSD (post-traumatic stress disorder)   . Sleep apnea    DOES NOT USE CPAP CURRENTLY-MASK DOES NOT FIT CORRECTLY [facility]  . Spondylosis   . Tubular adenoma of colon 2011   Dr Olevia Perches    Patient Active Problem List   Diagnosis Date Noted  . Ankle instability 11/23/2019  . Leg cramping 09/04/2019  . Pain in joint of right elbow 08/03/2019  . Lateral epicondylitis of right elbow 08/03/2019  . Nephrolithiasis 05/11/2019  . Dark stools 05/11/2019  . Epigastric pain 05/11/2019  . Other constipation 05/11/2019  . Right sided abdominal pain 05/11/2019  . Paresthesia 04/20/2019  . Depression 01/14/2019  . Left ear hearing loss 01/14/2019  . Low back pain 01/14/2019  . Intractable migraine with aura without status migrainosus 01/07/2019  . Genital herpes 05/06/2018  . STD exposure 05/06/2018  . Obstructive sleep apnea 02/13/2018  . Diarrhea 01/24/2018  . Hyperglycemia 01/24/2018  . Pelvic pain 07/10/2017  . Exposure to blood or body fluid 07/07/2017  . Trigger thumb of right hand 05/21/2017  . Trigger finger, right middle finger  03/18/2017  . Anxiety 03/18/2017  . Chronic tension-type headache, not intractable 02/12/2017  . Herpes simplex type II infection 12/02/2016  . Dysuria 12/02/2016  . Pain of right thumb 11/13/2016  . Other chest pain 07/27/2016  . Onychomycosis 06/12/2016  . Encounter for well adult exam with abnormal findings 06/12/2016  . Generalized headache 05/15/2016  . Lumbar paraspinal muscle spasm 05/15/2016  . Cervical paraspinal muscle spasm 05/15/2016  . History of DVT (deep vein thrombosis) 11/18/2015  . Elevated PSA 11/18/2015  . Acute pulmonary embolism ([place]) 2016 05/09/2015  . Peptic ulcer 05/09/2015  . Dyspnea 05/02/2015  . DVT (deep  venous thrombosis) ([place]) 04/26/2015  . Duodenal ulcer 04/25/2015  . Positive D dimer 04/23/2015  . Internal hemorrhoid, bleeding 04/04/2015  . Acute gastrointestinal hemorrhage 04/03/2015  . Duodenitis 04/03/2015  . Postural dizziness with presyncope 04/03/2015  . Microscopic hematuria 04/03/2015  . Asthma 12/31/2014  . Dislocated IOL (intraocular lens), initial encounter 12/10/2014  . MRSA infection 09/14/2013  . History of methicillin resistant Staphylococcus aureus infection 02/17/2013  . Spondylosis of lumbar joint 09/28/2012  . Fatty liver disease, nonalcoholic 16/96/7893  . Lumbosacral spondylosis without myelopathy 09/28/2012  . Fatty metamorphosis of liver 09/28/2012  . Atopic dermatitis 05/13/2012  . Seasonal and perennial allergic rhinitis 10/01/2011  . Family history of ischemic heart disease 04/02/2011  . 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/12/2011  . COLONIC POLYPS, HX OF 06/21/2009  . HLD (hyperlipidemia) 02/16/2008  . Asthma, mild persistent 02/16/2008  . NONSPECIFIC ABNORMAL ELECTROCARDIOGRAM 02/16/2008  . NEPHROLITHIASIS, HX OF 02/16/2008    Past Surgical History:  Procedure Laterality Date  . CATARACT EXTRACTION Left 1996   Dr Elonda Husky; [facility]  . colonoscopy with polypectomy  2011   Dr Olevia Perches, hemorrhoids  . CYSTOSCOPY W/ STONE MANIPULATION  2006  . EYE SURGERY Left   . INGUINAL HERNIA REPAIR Right 1990  . SHOULDER SURGERY Right 12/17/2010   Dr French Ana ; shoulder impingement & torn tendon  . TRANSURETHRAL RESECTION OF PROSTATE N/A 02/23/2020   Procedure: TRANSURETHRAL RESECTION OF THE PROSTATE (TURP);  Surgeon: Billey Co, MD;  Location: [facility] ORS;  Service: Urology;  Laterality: N/A;       Home Medications    Prior to Admission medications   Medication Sig Start Date End Date Taking? Authorizing Provider  acetaminophen (TYLENOL) 500 MG tablet Take 1,000 mg by mouth every 6 (six) hours as needed for moderate pain.   Yes  [provider]  albuterol (PROVENTIL HFA;VENTOLIN HFA) 108 (90 Base) MCG/ACT inhaler Inhale 2 puffs into the lungs every 6 (six) hours as needed. For shortness of breath 06/29/16  Yes Martinique, Betty G, MD  aspirin EC 81 MG tablet Take 81 mg by mouth daily.   Yes [provider]  Brinzolamide-Brimonidine 1-0.2 % SUSP Place 1 drop into the left eye 2 (two) times daily.    Yes [provider]  Cholecalciferol (VITAMIN D3 PO) Take 1 tablet by mouth daily.   Yes [provider]  fluticasone (FLONASE) 50 MCG/ACT nasal spray Place 2 sprays into both nostrils daily as needed for allergies. 04/29/18  Yes Hoyt Koch, MD  metaxalone (SKELAXIN) 800 MG tablet Take 800 mg by mouth 3 (three) times daily as needed.  06/01/19  Yes [provider]  Multiple Vitamin (MULTIVITAMIN) tablet Take 1 tablet by mouth daily.   Yes [provider]  OXcarbazepine (TRILEPTAL) 150 MG tablet Take 150 mg by mouth as needed (FOR ANXIETY/PTSD).  Yes [provider]  PARoxetine (PAXIL) 10 MG tablet Take 10 mg by mouth daily as needed (anxiety).    Yes [provider]  polyethylene glycol powder (GLYCOLAX/MIRALAX) 17 GM/SCOOP powder Take 17 g by mouth 2 (two) times daily as needed. 05/11/19  Yes Biagio Borg, MD  SYMBICORT 160-4.5 MCG/ACT inhaler Inhale 2 puffs into the lungs in the morning and at bedtime. 10/26/14  Yes [provider]  valACYclovir (VALTREX) 1000 MG tablet TAKE 1/2 TABLET BY MOUTH TWICE DAILY FOR 3 DAYS OR 1 TABLET BY MOUTH EVERY DAY FOR 5 DAYS AT ONSET OF BREAKOUT 12/06/19  Yes Biagio Borg, MD  sulfamethoxazole-trimethoprim (BACTRIM DS) 800-160 MG tablet Take 1 tablet by mouth 2 (two) times daily for 10 days. 04/13/20 04/23/20  Katy Apo, NP  omeprazole (PRILOSEC) 20 MG capsule Take 1 capsule (20 mg total) by mouth daily. 04/03/20 04/13/20  Faustino Congress, NP  topiramate (TOPAMAX) 25 MG tablet topiramate 25 mg  tablet Patient not taking: No sig reported  04/13/20  [provider]    Family History Family History  Problem Relation Age of Onset  . Heart attack Mother 55  . Stroke Father 6  . Diabetes Father        PVD  . Glaucoma Father        blindness  . Cancer Paternal Grandmother        ? primary  . Heart attack Maternal Uncle         X2,pre 60  . Heart attack Maternal Grandmother 73  . Asthma Neg Hx   . COPD Neg Hx     Social History Social History   Tobacco Use  . Smoking status: Never Smoker  . Smokeless tobacco: Never Used  Vaping Use  . Vaping Use: Never used  Substance Use Topics  . Alcohol use: Yes    Alcohol/week: 0.0 standard drinks    Comment: Socially , < 2X/ month  . Drug use: No     Allergies   Aleve [naproxen] and Nsaids   Review of Systems Review of Systems  Constitutional: Negative for activity change, appetite change, chills, fatigue and fever.  HENT: Negative for mouth sores, sore throat and trouble swallowing.   Respiratory: Negative for cough and shortness of breath.   Gastrointestinal: Negative for abdominal pain, constipation, diarrhea, nausea and vomiting.  Genitourinary: Positive for dysuria, frequency, penile pain and penile swelling. Negative for difficulty urinating, flank pain, genital sores, hematuria, penile discharge, scrotal swelling and urgency.  Musculoskeletal: Positive for arthralgias. Negative for back pain.  Skin: Negative for color change and rash.  Allergic/Immunologic: Positive for environmental allergies. Negative for food allergies.  Neurological: Negative for dizziness, tremors, seizures, syncope, weakness and headaches.  Hematological: Negative for adenopathy. Does not bruise/bleed easily.     Physical Exam Triage Vital Signs ED Triage Vitals  Enc Vitals Group     BP 04/13/20 1553 (!) 126/91     Pulse Rate 04/13/20 1553 87     Resp 04/13/20 1553 18     Temp 04/13/20 1553 98.3 F (36.8 C)     Temp  Source 04/13/20 1553 Oral     SpO2 04/13/20 1553 96 %     Weight 04/13/20 1553 210 lb (95.3 kg)     Height 04/13/20 1553 5\' 7"  (1.702 m)     Head Circumference --      Peak Flow --      Pain Score 04/13/20 1552 3  Pain Loc --      Pain Edu? --      Excl. in [place]? --    No data found.  Updated Vital Signs BP (!) 126/91 (BP Location: Left Arm)   Pulse 87   Temp 98.3 F (36.8 C) (Oral)   Resp 18   Ht 5\' 7"  (1.702 m)   Wt 210 lb (95.3 kg)   SpO2 96%   BMI 32.89 kg/m   Visual Acuity Right Eye Distance:   Left Eye Distance:   Bilateral Distance:    Right Eye Near:   Left Eye Near:    Bilateral Near:     Physical Exam Vitals and nursing note reviewed. Chaperone present: patient declined chaperone.  Constitutional:      General: He is awake. He is not in acute distress.    Appearance: He is well-developed and well-groomed.     Comments: He is sitting comfortably on the exam table in no acute distress.   HENT:     Head: Normocephalic and atraumatic.  Eyes:     Conjunctiva/sclera:     Right eye: Right conjunctiva is injected. No exudate.    Left eye: Left conjunctiva is injected. No exudate. Cardiovascular:     Rate and Rhythm: Normal rate and regular rhythm.     Heart sounds: Normal heart sounds. No murmur heard.   Pulmonary:     Effort: Pulmonary effort is normal.     Breath sounds: Normal breath sounds and air entry.  Abdominal:     General: Abdomen is flat. Bowel sounds are normal.     Palpations: Abdomen is soft.     Tenderness: There is no abdominal tenderness. There is no right CVA tenderness, left CVA tenderness, guarding or rebound.     Hernia: There is no hernia in the left inguinal area or right inguinal area.  Genitourinary:    Pubic Area: No rash.      Penis: Normal. No erythema, tenderness, discharge, swelling or lesions.      Comments: Briefly examined penis- no erythema, lesions or discharge present.  Musculoskeletal:     Cervical back: Normal  range of motion.  Skin:    General: Skin is warm and dry.     Findings: No erythema or rash.  Neurological:     General: No focal deficit present.     Mental Status: He is alert and oriented to person, place, and time.  Psychiatric:        Mood and Affect: Mood normal.        Behavior: Behavior normal. Behavior is cooperative.        Thought Content: Thought content normal.        Judgment: Judgment normal.      UC Treatments / Results  Labs (all labs ordered are listed, but only abnormal results are displayed) Labs Reviewed  URINALYSIS, COMPLETE (UACMP) WITH MICROSCOPIC - Abnormal; Notable for the following components:      Result Value   Color, Urine ORANGE (*)    Specific Gravity, Urine <1.005 (*)    Glucose, UA 100 (*)    Nitrite POSITIVE (*)    Bacteria, UA FEW (*)    All other components within normal limits  CHLAMYDIA/NGC RT PCR ([facility] ONLY)  URINE CULTURE    EKG   Radiology No results found.  Procedures Procedures (including critical care time)  Medications Ordered in UC Medications - No data to display  Initial Impression / Assessment and Plan / UC Course  I have reviewed the triage vital signs and the nursing notes.  Pertinent labs & imaging results that were available during my care of the patient were reviewed by me and considered in my medical decision making (see chart for details).    Reviewed urinalysis results with patient- positive nitrites, bacteria and glucose but results may not be accurate due to effects of AZO on testing. Discussed that he appears to have some bacteria present- reviewed that we will not know exact bacteria until urine culture and other STD testing has been performed. Since patient does not want to retry Doxycycline or Ciprofloxacin, will restart Bactrim DS 1 tablet twice a day for 10 days. Continue to push fluids. Recommend no sexual intercourse for at least 7 days. Recommend contact your Urologist in [place] or  your Urologist at the [place] on Monday (2 days) to schedule appointment for further evaluation. Follow-up also pending lab results.  Final Clinical Impressions(s) / UC Diagnoses   Final diagnoses:  Dysuria  Urinary frequency  Dyspareunia in male     Discharge Instructions     Recommend start Bactrim antibiotic since Doxycycline and Ciprofloxacin were not helpful in the past. Take Bactrim twice a day for 10 days. Continue to push fluids. Recommend no sexual intercourse for at least 7 days. Recommend contact your Urologist in [place] or your [place] Urologist on Monday to schedule appointment for further evaluation. Follow-up pending lab results and with your Urologist as planned.     ED Prescriptions    Medication Sig Dispense Auth. Provider   sulfamethoxazole-trimethoprim (BACTRIM DS) 800-160 MG tablet Take 1 tablet by mouth 2 (two) times daily for 10 days. 20 tablet Normalee Sistare, Nicholes Stairs, NP     PDMP not reviewed this encounter.   Katy Apo, NP 04/14/20 [phone]

## 2020-04-15 LAB — URINE CULTURE: Culture: NO GROWTH

## 2020-04-16 DIAGNOSIS — H02402 Unspecified ptosis of left eyelid: Secondary | ICD-10-CM | POA: Diagnosis not present

## 2020-04-16 DIAGNOSIS — H40023 Open angle with borderline findings, high risk, bilateral: Secondary | ICD-10-CM | POA: Diagnosis not present

## 2020-04-24 ENCOUNTER — Telehealth (INDEPENDENT_AMBULATORY_CARE_PROVIDER_SITE_OTHER): Payer: No Typology Code available for payment source | Admitting: Urology

## 2020-04-24 ENCOUNTER — Encounter: Payer: Self-pay | Admitting: Internal Medicine

## 2020-04-24 ENCOUNTER — Other Ambulatory Visit: Payer: Self-pay

## 2020-04-24 DIAGNOSIS — R300 Dysuria: Secondary | ICD-10-CM

## 2020-04-24 NOTE — Progress Notes (Signed)
Virtual Visit via Telephone Note  I connected with Anthony [name] on 04/24/20 at 11:00 AM EST by telephone and verified that I am speaking with the correct person using two identifiers.   Patient location: Home Provider location: [facility]   I discussed the limitations, risks, security and privacy concerns of performing an evaluation and management service by telephone and the availability of in person appointments. We discussed the impact of the COVID-19 pandemic on the healthcare system, and the importance of social distancing and reducing patient and provider exposure. I also discussed with the patient that there may be a patient responsible charge related to this service. The patient expressed understanding and agreed to proceed.  Reason for visit: Dysuria  History of Present Illness: I had phone follow-up with Anthony [name] today.  He is a 54 year old male with a very long history of intermittent dysuria.  On cystoscopy he actually was found to have a 1 cm prostatic cyst, and this was resected on 02/23/2020, and he was discharged without a Foley.  He had been doing well since surgery with a good stream and no burning.  He presented to the ED on 04/13/2020 after developing some " urethral stinging" after sexual activity.  Urinalysis and STD testing were benign, and urine culture was negative.  He was started on Bactrim.  He is not sure the Bactrim made much of a difference, but his symptoms have resolved spontaneously over the last few days.  He denies any gross hematuria or clots, and is urinating with a strong stream.  I suspect anxiety plays a large role in his intermittent stinging and dysuria, and he has had numerous ER visits both at [facility] and at the [facility] for similar complaints with negative work-up.  Reassurance was provided at length.  I also recommended using condoms with sexual activity, since that seems to aggravate his symptoms.   Follow Up: Keep 55-month follow-up as  scheduled   I discussed the assessment and treatment plan with the patient. The patient was provided an opportunity to ask questions and all were answered. The patient agreed with the plan and demonstrated an understanding of the instructions.   The patient was advised to call back or seek an in-person evaluation if the symptoms worsen or if the condition fails to improve as anticipated.  I provided 12 minutes of non-face-to-face time during this encounter.   Billey Co, MD

## 2020-04-25 DIAGNOSIS — Z20822 Contact with and (suspected) exposure to covid-19: Secondary | ICD-10-CM | POA: Diagnosis not present

## 2020-05-06 ENCOUNTER — Encounter: Payer: Self-pay | Admitting: Internal Medicine

## 2020-05-09 ENCOUNTER — Telehealth: Payer: Self-pay | Admitting: Gastroenterology

## 2020-05-09 NOTE — Telephone Encounter (Signed)
Understood, thanks.  However, I would like him to consider moving it to earlier in February because he has had rectal bleeding.  - HD

## 2020-05-09 NOTE — Telephone Encounter (Signed)
Hey Dr Myrtie Neither, this pt rescheduled his colonoscopy from tomorrow 1/7 to 3/3 due to coming down with flu like symptoms.

## 2020-05-10 ENCOUNTER — Encounter: Payer: Federal, State, Local not specified - PPO | Admitting: Gastroenterology

## 2020-05-15 ENCOUNTER — Telehealth: Payer: Self-pay

## 2020-05-15 NOTE — Progress Notes (Signed)
HPI male never smoker, [employer], followed for allergic rhinitis, asthma, complicated by history PE/DVT 2016, peptic ulcer, depression ([facility]), glaucoma, OSA ([facility]) Allergy Profile 09/25/2011-total IgE 159.6 with multiple specific elevations especially for grass pollens, molds, tree pollens and ragweed. PFT: 10/08/2011-mild obstructive airways disease-small airways, insignificant response to bronchodilator Hypercoag panel 04/26/15- incr Protein C,/ managed by Elna Breslow, [place] He brings [place] records indicating wheezing and bronchospasm "probably secondary to pneumonia" in 1988 when he was in service in [place]. He was diagnosed with asthma and bronchopneumonia at that time. He says he had no asthma before that pneumonia and he denies family history of asthma. He requests a letter supporting his argument that asthma developed as a consequence of pneumonia which occurred while he was in [employer] service.  ---------------------------------------------------------------------------------------   05/17/19- 55 year old male never smoker, [employer],  followed for allergic rhinitis, asthma, complicated by history PE/DVT 2016, peptic ulcer, depression ([facility]), Glaucoma, OSA ([facility]) Symbicort 160, albuterol HFA, Flonase For ? Weeks, has felt sore tightness at xiphoid and bilateral abdomen. Not wheezing or coughing. Satisfied with current asthma meds, provided through [place]. GI is planning [facility], but he doesn't have a connection for refitting CPAP mask, which is uncomfortable. CXR 07/27/16- Mild basilar subsegmental atelectasis .  Exam otherwise negative.  05/16/20- 55 year old male never smoker, [employer],  followed for allergic Rhinitis, Asthma, complicated by history PE/DVT 2016, peptic ulcer, depression ([facility]), Glaucoma, OSA ([facility]), Covid infection jan, 2022,  Symbicort 160, albuterol HFA, Flonase Covid vax- 2 Moderna Flu vax-  declines Had quarantined after Covid positive on Jan 3 , following visit to grandson + in [place]. Minor cough scant clear phlegm. Continues Symbicort daily, using rescue hfa 1x/ month I recommended Covid booster at least 4-6 weeks after infection. CXR 05/17/19- IMPRESSION: Normal chest radiographs.   ROS-see HPI + = positive Constitutional:   No-   weight loss, night sweats, fevers, chills, fatigue, lassitude. HEENT:   No-  headaches, difficulty swallowing, tooth/dental problems, sore throat,    no-sneezing, no-itching, ear ache, no- nasal congestion, post nasal drip,  CV:  +chest pain, No- orthopnea, PND, swelling in lower extremities, anasarca, dizziness, palpitations Resp: +shortness of breath with exertion or at rest.              No-  productive cough,  No non-productive cough,  No- coughing up of blood.                change in color of mucus.  No- wheezing.   Skin:  GI:  No-   heartburn, indigestion, abdominal pain, nausea, vomiting, GU:, MS:  No-   joint pain or swelling.   Neuro-     nothing unusual Psych:  No- change in mood or affect. No depression or anxiety.  No memory loss.  OBJ- Physical Exam General- Alert, Oriented, Affect-appropriate, Distress- none acute Skin- + mild hyperkeratosis on anterior chest Lymphadenopathy- none Head- atraumatic            Eyes- Gross vision intact, PERRLA, conjunctivae and secretions clear            Ears- Hearing, canals-normal            Nose-no-sniffing, no-Septal dev, polyps, erosion, perforation             Throat- Mallampati III , mucosa clear , drainage- none, tonsils- atrophic Neck- flexible , trachea midline, no stridor , thyroid nl, carotid no bruit Chest -  symmetrical excursion , unlabored           Heart/CV- RRR , no murmur , no gallop  , no rub, nl s1 s2                           - JVD- none , edema- none, stasis changes- none, varices- none           Lung- clear to P&A, wheeze-none, cough- none , dullness-none, rub-  none           Chest wall-  Abd-  Br/ Gen/ Rectal- Not done, not indicated Extrem- cyanosis- none, clubbing, none, atrophy- none, strength- nl, negative Homan's Neuro- grossly intact to observation

## 2020-05-15 NOTE — Telephone Encounter (Signed)
Patient sent email to same issue. Waiting to hear back from his response.

## 2020-05-15 NOTE — Telephone Encounter (Signed)
ATC patient to see when he tested positive for Covid. Looks like January 6th or 7th and if that is correct he is not outside the 10 day window to come in the office and will need to be rescheduled. LMTCB

## 2020-05-16 ENCOUNTER — Encounter: Payer: Self-pay | Admitting: Internal Medicine

## 2020-05-16 ENCOUNTER — Other Ambulatory Visit: Payer: Self-pay

## 2020-05-16 ENCOUNTER — Ambulatory Visit (INDEPENDENT_AMBULATORY_CARE_PROVIDER_SITE_OTHER): Payer: No Typology Code available for payment source | Admitting: Internal Medicine

## 2020-05-16 DIAGNOSIS — G4733 Obstructive sleep apnea (adult) (pediatric): Secondary | ICD-10-CM

## 2020-05-16 DIAGNOSIS — J3089 Other allergic rhinitis: Secondary | ICD-10-CM | POA: Diagnosis not present

## 2020-05-16 DIAGNOSIS — J302 Other seasonal allergic rhinitis: Secondary | ICD-10-CM

## 2020-05-16 DIAGNOSIS — J4530 Mild persistent asthma, uncomplicated: Secondary | ICD-10-CM

## 2020-05-16 NOTE — Patient Instructions (Signed)
I'm glad you have gotten through the Covid no worse.   We suggested that you could get a booster shot in about 4-6 weeks.  Please call if we can help

## 2020-05-27 DIAGNOSIS — H527 Unspecified disorder of refraction: Secondary | ICD-10-CM | POA: Diagnosis not present

## 2020-05-27 DIAGNOSIS — H2511 Age-related nuclear cataract, right eye: Secondary | ICD-10-CM | POA: Diagnosis not present

## 2020-05-27 DIAGNOSIS — H02402 Unspecified ptosis of left eyelid: Secondary | ICD-10-CM | POA: Diagnosis not present

## 2020-05-27 DIAGNOSIS — H4052X0 Glaucoma secondary to other eye disorders, left eye, stage unspecified: Secondary | ICD-10-CM | POA: Diagnosis not present

## 2020-06-03 ENCOUNTER — Encounter: Payer: Self-pay | Admitting: Internal Medicine

## 2020-06-13 DIAGNOSIS — Z87438 Personal history of other diseases of male genital organs: Secondary | ICD-10-CM | POA: Diagnosis not present

## 2020-06-15 ENCOUNTER — Ambulatory Visit
Admission: EM | Admit: 2020-06-15 | Discharge: 2020-06-15 | Disposition: A | Discharge status: Home / Self Care | Payer: No Typology Code available for payment source | Attending: Emergency Medicine | Admitting: Emergency Medicine

## 2020-06-15 ENCOUNTER — Other Ambulatory Visit: Payer: Self-pay

## 2020-06-15 DIAGNOSIS — Z136 Encounter for screening for cardiovascular disorders: Secondary | ICD-10-CM | POA: Diagnosis not present

## 2020-06-15 DIAGNOSIS — R002 Palpitations: Secondary | ICD-10-CM

## 2020-06-15 DIAGNOSIS — M4300 Spondylolysis, site unspecified: Secondary | ICD-10-CM

## 2020-06-15 MED ORDER — PREDNISONE 10 MG (21) PO TBPK: ORAL_TABLET | ORAL | 0 refills | Status: DC

## 2020-06-15 MED ORDER — KETOROLAC TROMETHAMINE 60 MG/2ML IM SOLN
60.0000 mg | Freq: Once | INTRAMUSCULAR | Status: AC
  Administered 2020-06-15: 60 mg via INTRAMUSCULAR

## 2020-06-15 NOTE — Discharge Instructions (Signed)
You were seen for palpitations and back pain and are being treated for palpitations and spondylosis.   If you continue to have these palpitations, follow-up with your primary care provider.  You may need to be referral to a cardiologist for an external heart rhythm monitor.  You can start your prednisone tomorrow.  Continue with the occasional use of her muscle relaxer and heat.  Follow-up with your spine doctor as needed.  Take care, Dr. Marland Kitchen, NP-c

## 2020-06-15 NOTE — ED Provider Notes (Signed)
Anthony [name], Anthony [name]   Name: Anthony [name] DOB: 02-14-1966 MRN: [id number] CSN: [id number] PCP: Biagio Borg, MD  Arrival date and time:  06/15/20 1329  Chief Complaint:  Tachycardia   NOTE: Prior to seeing the patient today, I have reviewed the triage nursing documentation and vital signs. Clinical staff has updated patient's PMH/PSHx, current medication list, and drug allergies/intolerances to ensure comprehensive history available to assist in medical decision making.   History:   HPI: Anthony [name] is a 55 y.o. male who presents today with complaints of palpitations and back pain.  Patient has been noticed intermittent palpitations/tachycardia over the past 2-1/2 weeks.  He first noted the symptoms after taking a half a Viagra pill prior to sexual intercourse.  Since taking his Viagra, he has noticed an elevated heart rate at rest and after doing mildly strenuous activities.  He denies any chest pain/pressure, shortness of breath, dizziness, syncopal or presyncopal episodes.  Patient was taken Viagra in the past and has never had a reaction to the medication.  He has no previous cardiac history.  Patient is also here to get evaluated for his back pain.  Patient has a known diagnosis spondylosis.  After washing his car on Wednesday afternoon, he started some increased pain to his thoracic and lumbar areas of his back.  The pain is not radiating down his legs.  Patient is unable to take over-the-counter NSAIDs due to a history of a GI bleed.  No bowel or bladder incontinence.   Past Medical History:  Diagnosis Date  . Anemia   . Anxiety   . Asthma   . Cataract   . Complication of anesthesia    HARD TO WAKE UP AFTER 2ND EYE SURGERY  . Depression   . DVT (deep venous thrombosis) (HCC)    DUE TO A LONG TRAVEL  . Fatty liver   . Genital herpes 05/06/2018  . H/O renal calculi 2006  . Headache    MIGRAINES  . History of kidney stones    H/O  . History of  methicillin resistant staphylococcus aureus (MRSA)   . Hyperlipidemia   . Internal hemorrhoid   . PE (pulmonary embolism)   . Peptic ulcer disease   . Pneumonia    h/o  . PTSD (post-traumatic stress disorder)   . Sleep apnea    DOES NOT USE CPAP CURRENTLY-MASK DOES NOT FIT CORRECTLY [facility]13[facility]20)  . Spondylosis   . Tubular adenoma of colon 2011   Dr Olevia Perches    Past Surgical History:  Procedure Laterality Date  . CATARACT EXTRACTION Left 1996   Dr Elonda Husky; [facility]  . colonoscopy with polypectomy  2011   Dr Olevia Perches, hemorrhoids  . CYSTOSCOPY W/ STONE MANIPULATION  2006  . EYE SURGERY Left   . INGUINAL HERNIA REPAIR Right 1990  . SHOULDER SURGERY Right 12/17/2010   Dr French Ana ; shoulder impingement & torn tendon  . TRANSURETHRAL RESECTION OF PROSTATE N/A 02/23/2020   Procedure: TRANSURETHRAL RESECTION OF THE PROSTATE (TURP);  Surgeon: Billey Co, MD;  Location: [facility] ORS;  Service: Urology;  Laterality: N/A;    Family History  Problem Relation Age of Onset  . Heart attack Mother 38  . Stroke Father 7  . Diabetes Father        PVD  . Glaucoma Father        blindness  . Cancer Paternal Grandmother        ?  primary  . Heart attack Maternal Uncle         X2,pre 64  . Heart attack Maternal Grandmother 73  . Asthma Neg Hx   . COPD Neg Hx     Social History   Tobacco Use  . Smoking status: Never Smoker  . Smokeless tobacco: Never Used  Vaping Use  . Vaping Use: Never used  Substance Use Topics  . Alcohol use: Yes    Alcohol/week: 0.0 standard drinks    Comment: Socially , < 2X/ month  . Drug use: No    Patient Active Problem List   Diagnosis Date Noted  . Ankle instability 11/23/2019  . Leg cramping 09/04/2019  . Pain in joint of right elbow 08/03/2019  . Lateral epicondylitis of right elbow 08/03/2019  . Nephrolithiasis 05/11/2019  . Dark stools 05/11/2019  . Epigastric pain 05/11/2019  . Other constipation 05/11/2019  . Right  sided abdominal pain 05/11/2019  . Paresthesia 04/20/2019  . Depression 01/14/2019  . Left ear hearing loss 01/14/2019  . Low back pain 01/14/2019  . Intractable migraine with aura without status migrainosus 01/07/2019  . Genital herpes 05/06/2018  . STD exposure 05/06/2018  . Obstructive sleep apnea 02/13/2018  . Diarrhea 01/24/2018  . Hyperglycemia 01/24/2018  . Pelvic pain 07/10/2017  . Exposure to blood or body fluid 07/07/2017  . Trigger thumb of right hand 05/21/2017  . Trigger finger, right middle finger 03/18/2017  . Anxiety 03/18/2017  . Chronic tension-type headache, not intractable 02/12/2017  . Herpes simplex type II infection 12/02/2016  . Dysuria 12/02/2016  . Pain of right thumb 11/13/2016  . Other chest pain 07/27/2016  . Onychomycosis 06/12/2016  . Encounter for well adult exam with abnormal findings 06/12/2016  . Generalized headache 05/15/2016  . Lumbar paraspinal muscle spasm 05/15/2016  . Cervical paraspinal muscle spasm 05/15/2016  . History of DVT (deep vein thrombosis) 11/18/2015  . Elevated PSA 11/18/2015  . Acute pulmonary embolism ([place]) 2016 05/09/2015  . Peptic ulcer 05/09/2015  . Dyspnea 05/02/2015  . DVT (deep venous thrombosis) ([place]) 04/26/2015  . Duodenal ulcer 04/25/2015  . Positive D dimer 04/23/2015  . Internal hemorrhoid, bleeding 04/04/2015  . Acute gastrointestinal hemorrhage 04/03/2015  . Duodenitis 04/03/2015  . Postural dizziness with presyncope 04/03/2015  . Microscopic hematuria 04/03/2015  . Asthma 12/31/2014  . Dislocated IOL (intraocular lens), initial encounter 12/10/2014  . MRSA infection 09/14/2013  . History of methicillin resistant Staphylococcus aureus infection 02/17/2013  . Spondylosis of lumbar joint 09/28/2012  . Fatty liver disease, nonalcoholic 22/06/5425  . Lumbosacral spondylosis without myelopathy 09/28/2012  . Fatty metamorphosis of liver 09/28/2012  . Atopic dermatitis 05/13/2012  . Seasonal and perennial  allergic rhinitis 10/01/2011  . Family history of ischemic heart disease 04/02/2011  . 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/12/2011  . COLONIC POLYPS, HX OF 06/21/2009  . HLD (hyperlipidemia) 02/16/2008  . Asthma, mild persistent 02/16/2008  . NONSPECIFIC ABNORMAL ELECTROCARDIOGRAM 02/16/2008  . NEPHROLITHIASIS, HX OF 02/16/2008    Home Medications:    Current Meds  Medication Sig  . acetaminophen (TYLENOL) 500 MG tablet Take 1,000 mg by mouth every 6 (six) hours as needed for moderate pain.  Marland Kitchen albuterol (PROVENTIL HFA;VENTOLIN HFA) 108 (90 Base) MCG/ACT inhaler Inhale 2 puffs into the lungs every 6 (six) hours as needed. For shortness of breath  . aspirin EC 81 MG tablet Take 81 mg by mouth daily.  . Brinzolamide-Brimonidine 1-0.2 % SUSP Place 1 drop  into the left eye 2 (two) times daily.   . Cholecalciferol (VITAMIN D3 PO) Take 1 tablet by mouth daily.  . fluticasone (FLONASE) 50 MCG/ACT nasal spray Place 2 sprays into both nostrils daily as needed for allergies.  . Multiple Vitamin (MULTIVITAMIN) tablet Take 1 tablet by mouth daily.  . OXcarbazepine (TRILEPTAL) 150 MG tablet Take 150 mg by mouth as needed (FOR ANXIETY/PTSD).  Marland Kitchen PARoxetine (PAXIL) 10 MG tablet Take 10 mg by mouth daily as needed (anxiety).   . predniSONE (STERAPRED UNI-PAK 21 TAB) 10 MG (21) TBPK tablet Take as instructed on package (60, 50, 40, 30, 20, 10)  . SYMBICORT 160-4.5 MCG/ACT inhaler Inhale 2 puffs into the lungs in the morning and at bedtime.  . valACYclovir (VALTREX) 1000 MG tablet TAKE 1/2 TABLET BY MOUTH TWICE DAILY FOR 3 DAYS OR 1 TABLET BY MOUTH EVERY DAY FOR 5 DAYS AT ONSET OF BREAKOUT    Allergies:   Aleve [naproxen] and Nsaids  Review of Systems (ROS): Review of Systems  Constitutional: Negative for chills and fever.  Cardiovascular: Positive for palpitations. Negative for chest pain and leg swelling.  Gastrointestinal: Negative for vomiting.  Neurological: Negative  for dizziness, light-headedness and headaches.     Vital Signs: Today's Vitals   06/15/20 1340 06/15/20 1341 06/15/20 1345 06/15/20 1440  BP:   109/87   Pulse:   78   Resp:   18   Temp:   98.3 F (36.8 C)   TempSrc:   Oral   SpO2:   99%   Weight:  207 lb (93.9 kg)    Height:  5\' 7"  (1.702 m)    PainSc: 8    8     Physical Exam: Physical Exam Vitals and nursing note reviewed.  Constitutional:      Appearance: Normal appearance.  Cardiovascular:     Rate and Rhythm: Normal rate and regular rhythm.     Pulses: Normal pulses.     Heart sounds: Normal heart sounds.  Pulmonary:     Effort: Pulmonary effort is normal.     Breath sounds: Normal breath sounds.  Musculoskeletal:     Thoracic back: Tenderness present.     Lumbar back: Tenderness present.     Comments: Spine flexion and extension limited secondary to pain.   Skin:    General: Skin is warm and dry.  Neurological:     General: No focal deficit present.     Mental Status: He is alert and oriented to person, place, and time.  Psychiatric:        Mood and Affect: Mood normal.        Behavior: Behavior normal.      Urgent Care Treatments / Results:   LABS: PLEASE NOTE: all labs that were ordered this encounter are listed, however only abnormal results are displayed. Labs Reviewed - No data to display  EKG: -None  RADIOLOGY: No results found.  PROCEDURES: Procedures  MEDICATIONS RECEIVED THIS VISIT: Medications  ketorolac (TORADOL) injection 60 mg (60 mg Intramuscular Given 06/15/20 1421)    PERTINENT CLINICAL COURSE NOTES/UPDATES:   Initial Impression / Assessment and Plan / Urgent Care Course:  Pertinent labs & imaging results that were available during my care of the patient were personally reviewed by me and considered in my medical decision making (see lab/imaging section of note for values and interpretations).  WASH NIENHAUS is a 55 y.o. male who presents to [facility] today with  complaints of elevated heart  rate and back pain, diagnosed with palpitations and acute flare spondylosis, and treated as such with the medications below. NP and patient reviewed discharge instructions below during visit.   Patient is well appearing overall in clinic today. He does not appear to be in any acute distress. Presenting symptoms (see HPI) and exam as documented above.   I have reviewed the follow up and strict return precautions for any new or worsening symptoms. Patient is aware of symptoms that would be deemed urgent/emergent, and would thus require further evaluation either here or in the emergency department. At the time of discharge, he verbalized understanding and consent with the discharge plan as it was reviewed with him. All questions were fielded by provider and/or clinic staff prior to patient discharge.    Final Clinical Impressions / Urgent Care Diagnoses:   Final diagnoses:  Palpitations  Spondylolysis    New Prescriptions:  [place] Controlled Substance Registry consulted? Not Applicable  Meds ordered this encounter  Medications  . ketorolac (TORADOL) injection 60 mg  . predniSONE (STERAPRED UNI-PAK 21 TAB) 10 MG (21) TBPK tablet    Sig: Take as instructed on package (60, 50, 40, 30, 20, 10)    Dispense:  1 each    Refill:  0      Discharge Instructions     You were seen for palpitations and back pain and are being treated for palpitations and spondylosis.   If you continue to have these palpitations, follow-up with your primary care provider.  You may need to be referral to a cardiologist for an external heart rhythm monitor.  You can start your prednisone tomorrow.  Continue with the occasional use of her muscle relaxer and heat.  Follow-up with your spine doctor as needed.  Take care, Dr. Marland Kitchen, NP-c     Recommended Follow up Care:  Patient encouraged to follow up with the following provider within the specified time frame, or sooner as dictated by  the severity of his symptoms. As always, he was instructed that for any urgent/emergent care needs, he should seek care either here or in the emergency department for more immediate evaluation.   Gertie Baron, DNP, NP-c    Gertie Baron, NP 06/15/20 [phone]

## 2020-06-15 NOTE — ED Triage Notes (Signed)
Patient states that he has been noticing tachycardia for 2.5 weeks since taking Viagra. Patient states that heart rate has been staying around 124. States he is having back pain that is chronic, states that he AS. Reports that he washed his truck on Wednesday and has been flaring up since.

## 2020-06-17 ENCOUNTER — Telehealth: Payer: Self-pay

## 2020-06-17 NOTE — Telephone Encounter (Signed)
Followed up patient states he was seen at urgent care an ekg was done he just wanted to make sure he was ok.

## 2020-06-21 ENCOUNTER — Ambulatory Visit
Admission: EM | Admit: 2020-06-21 | Discharge: 2020-06-21 | Discharge status: Absent without leave | Payer: Federal, State, Local not specified - PPO

## 2020-06-21 ENCOUNTER — Encounter: Payer: Self-pay | Admitting: Internal Medicine

## 2020-06-21 DIAGNOSIS — M79605 Pain in left leg: Secondary | ICD-10-CM | POA: Diagnosis not present

## 2020-06-21 DIAGNOSIS — Z0189 Encounter for other specified special examinations: Secondary | ICD-10-CM | POA: Diagnosis not present

## 2020-06-25 ENCOUNTER — Encounter: Payer: Self-pay | Admitting: Internal Medicine

## 2020-06-25 ENCOUNTER — Other Ambulatory Visit: Payer: Self-pay

## 2020-06-25 ENCOUNTER — Ambulatory Visit (INDEPENDENT_AMBULATORY_CARE_PROVIDER_SITE_OTHER): Payer: No Typology Code available for payment source | Admitting: Internal Medicine

## 2020-06-25 ENCOUNTER — Other Ambulatory Visit: Payer: Self-pay | Admitting: Internal Medicine

## 2020-06-25 VITALS — BP 110/80 | HR 73 | Temp 98.4°F | Ht 67.0 in | Wt 211.2 lb

## 2020-06-25 DIAGNOSIS — R739 Hyperglycemia, unspecified: Secondary | ICD-10-CM | POA: Diagnosis not present

## 2020-06-25 DIAGNOSIS — Z125 Encounter for screening for malignant neoplasm of prostate: Secondary | ICD-10-CM | POA: Diagnosis not present

## 2020-06-25 DIAGNOSIS — Z1159 Encounter for screening for other viral diseases: Secondary | ICD-10-CM

## 2020-06-25 DIAGNOSIS — F4310 Post-traumatic stress disorder, unspecified: Secondary | ICD-10-CM | POA: Diagnosis not present

## 2020-06-25 DIAGNOSIS — E559 Vitamin D deficiency, unspecified: Secondary | ICD-10-CM | POA: Diagnosis not present

## 2020-06-25 DIAGNOSIS — E785 Hyperlipidemia, unspecified: Secondary | ICD-10-CM

## 2020-06-25 DIAGNOSIS — F329 Major depressive disorder, single episode, unspecified: Secondary | ICD-10-CM | POA: Insufficient documentation

## 2020-06-25 DIAGNOSIS — F32A Depression, unspecified: Secondary | ICD-10-CM | POA: Diagnosis not present

## 2020-06-25 DIAGNOSIS — Z0001 Encounter for general adult medical examination with abnormal findings: Secondary | ICD-10-CM | POA: Diagnosis not present

## 2020-06-25 DIAGNOSIS — E538 Deficiency of other specified B group vitamins: Secondary | ICD-10-CM

## 2020-06-25 DIAGNOSIS — G5712 Meralgia paresthetica, left lower limb: Secondary | ICD-10-CM

## 2020-06-25 LAB — HEPATIC FUNCTION PANEL
ALT: 20 U/L (ref 0–53)
AST: 16 U/L (ref 0–37)
Albumin: 4.4 g/dL (ref 3.5–5.2)
Alkaline Phosphatase: 71 U/L (ref 39–117)
Bilirubin, Direct: 0.1 mg/dL (ref 0.0–0.3)
Total Bilirubin: 0.7 mg/dL (ref 0.2–1.2)
Total Protein: 7.2 g/dL (ref 6.0–8.3)

## 2020-06-25 LAB — CBC WITH DIFFERENTIAL/PLATELET
Basophils Absolute: 0.1 10*3/uL (ref 0.0–0.1)
Basophils Relative: 1.2 % (ref 0.0–3.0)
Eosinophils Absolute: 0.3 10*3/uL (ref 0.0–0.7)
Eosinophils Relative: 6.4 % — ABNORMAL HIGH (ref 0.0–5.0)
HCT: 43.7 % (ref 39.0–52.0)
Hemoglobin: 14.7 g/dL (ref 13.0–17.0)
Lymphocytes Relative: 36.3 % (ref 12.0–46.0)
Lymphs Abs: 1.7 10*3/uL (ref 0.7–4.0)
MCHC: 33.7 g/dL (ref 30.0–36.0)
MCV: 82.9 fl (ref 78.0–100.0)
Monocytes Absolute: 0.4 10*3/uL (ref 0.1–1.0)
Monocytes Relative: 9.5 % (ref 3.0–12.0)
Neutro Abs: 2.2 10*3/uL (ref 1.4–7.7)
Neutrophils Relative %: 46.6 % (ref 43.0–77.0)
Platelets: 250 10*3/uL (ref 150.0–400.0)
RBC: 5.28 Mil/uL (ref 4.22–5.81)
RDW: 16.1 % — ABNORMAL HIGH (ref 11.5–15.5)
WBC: 4.7 10*3/uL (ref 4.0–10.5)

## 2020-06-25 LAB — URINALYSIS, ROUTINE W REFLEX MICROSCOPIC
Bilirubin Urine: NEGATIVE
Hgb urine dipstick: NEGATIVE
Ketones, ur: NEGATIVE
Leukocytes,Ua: NEGATIVE
Nitrite: NEGATIVE
Specific Gravity, Urine: 1.025 (ref 1.000–1.030)
Total Protein, Urine: NEGATIVE
Urine Glucose: NEGATIVE
Urobilinogen, UA: 0.2 (ref 0.0–1.0)
pH: 6 (ref 5.0–8.0)

## 2020-06-25 LAB — BASIC METABOLIC PANEL
BUN: 10 mg/dL (ref 6–23)
CO2: 27 mEq/L (ref 19–32)
Calcium: 9.5 mg/dL (ref 8.4–10.5)
Chloride: 103 mEq/L (ref 96–112)
Creatinine, Ser: 0.95 mg/dL (ref 0.40–1.50)
GFR: 91.44 mL/min (ref >60.00)
Glucose, Bld: 92 mg/dL (ref 70–99)
Potassium: 4.3 mEq/L (ref 3.5–5.1)
Sodium: 137 mEq/L (ref 135–145)

## 2020-06-25 LAB — VITAMIN B12: Vitamin B-12: 655 pg/mL (ref 211–911)

## 2020-06-25 LAB — LIPID PANEL
Cholesterol: 199 mg/dL (ref 0–200)
HDL: 51.5 mg/dL (ref >39.00)
LDL Cholesterol: 127 mg/dL — ABNORMAL HIGH (ref 0–99)
NonHDL: 147.33
Total CHOL/HDL Ratio: 4
Triglycerides: 101 mg/dL (ref 0.0–149.0)
VLDL: 20.2 mg/dL (ref 0.0–40.0)

## 2020-06-25 LAB — TSH: TSH: 0.7 u[IU]/mL (ref 0.35–4.50)

## 2020-06-25 LAB — HEMOGLOBIN A1C: Hgb A1c MFr Bld: 5.9 % (ref 4.6–6.5)

## 2020-06-25 LAB — PSA: PSA: 0.66 ng/mL (ref 0.10–4.00)

## 2020-06-25 LAB — VITAMIN D 25 HYDROXY (VIT D DEFICIENCY, FRACTURES): VITD: 39.83 ng/mL (ref 30.00–100.00)

## 2020-06-25 MED ORDER — ATORVASTATIN CALCIUM 10 MG PO TABS: 10.0000 mg | ORAL_TABLET | Freq: Every day | ORAL | 3 refills | Status: DC

## 2020-06-25 NOTE — Patient Instructions (Signed)
Please have your covid booster done about July 1 , 2022  Please continue all other medications as before, and refills have been done if requested.  Please have the pharmacy call with any other refills you may need.  Please continue your efforts at being more active, low cholesterol diet, and weight control.  You are otherwise up to date with prevention measures today.  Please keep your appointments with your specialists as you may have planned  Please go to the LAB at the blood drawing area for the tests to be done  You will be contacted by phone if any changes need to be made immediately.  Otherwise, you will receive a letter about your results with an explanation, but please check with MyChart first.  Please remember to sign up for MyChart if you have not done so, as this will be important to you in the future with finding out test results, communicating by private email, and scheduling acute appointments online when needed.  Please make an Appointment to return in 6 months, or sooner if needed

## 2020-06-25 NOTE — Progress Notes (Signed)
Patient ID: Anthony [name], male   DOB: 01/01/1966, 55 y.o.   MRN: [id number]         Chief Complaint:: wellness exam and Numbness (See pt message on 06/21/20; intermittent left thigh numbness)        HPI:  Anthony [name] is a 55 y.o. male here for wellness exam; plans to get covid booster soon.  Declines flu shot.                          Also s/p covid infection early jan 2021; since then has had mild intermittetn tachycardia to 120s.  Also has had had mild left upper thigh numbness and discomfort, more noticeable to keeping the leg elevated on the ottoman, for several months.  Nothing else seems to make better or worse.  [facility]  Pt continues to have recurring LBP without change in severity, bowel or bladder change, fever, wt loss,  worsening LE pain/numbness/weakness, gait change or falls, has known spondylosis, and plans to f/u spine and scoliosis group.  Has been trying to follow lower cholesterol diet, and has been not taking his statin since July 2021.  Had neg d dimer at [place] last Friday per pt   No other new complaints.     Wt Readings from Last 3 Encounters:  06/25/20 211 lb 3.2 oz (95.8 kg)  06/15/20 207 lb (93.9 kg)  05/16/20 214 lb (97.1 kg)   BP Readings from Last 3 Encounters:  06/25/20 110/80  06/15/20 109/87  05/16/20 136/62   Immunization History  Administered Date(s) Administered  . PFIZER(Purple Top)SARS-COV-2 Vaccination 08/18/2019, 09/17/2019  . Tdap 01/19/2011   There are no preventive care reminders to display for this patient.    Past Medical History:  Diagnosis Date  . Anemia   . Anxiety   . Asthma   . Cataract   . Complication of anesthesia    HARD TO WAKE UP AFTER 2ND EYE SURGERY  . Depression   . DVT (deep venous thrombosis) (HCC)    DUE TO A LONG TRAVEL  . Fatty liver   . Genital herpes 05/06/2018  . H/O renal calculi 2006  . Headache    MIGRAINES  . History of kidney stones    H/O  . History of methicillin resistant  staphylococcus aureus (MRSA)   . Hyperlipidemia   . Internal hemorrhoid   . PE (pulmonary embolism)   . Peptic ulcer disease   . Pneumonia    h/o  . PTSD (post-traumatic stress disorder)   . Sleep apnea    DOES NOT USE CPAP CURRENTLY-MASK DOES NOT FIT CORRECTLY SO [facility] (02-13-20)  . Spondylosis   . Tubular adenoma of colon 2011   Dr Olevia Perches   Past Surgical History:  Procedure Laterality Date  . CATARACT EXTRACTION Left 1996   Dr Elonda Husky; [facility]  . colonoscopy with polypectomy  2011   Dr Olevia Perches, hemorrhoids  . CYSTOSCOPY W/ STONE MANIPULATION  2006  . EYE SURGERY Left   . INGUINAL HERNIA REPAIR Right 1990  . SHOULDER SURGERY Right 12/17/2010   Dr French Ana ; shoulder impingement & torn tendon  . TRANSURETHRAL RESECTION OF PROSTATE N/A 02/23/2020   Procedure: TRANSURETHRAL RESECTION OF THE PROSTATE (TURP);  Surgeon: Billey Co, MD;  Location: [facility] ORS;  Service: Urology;  Laterality: N/A;    reports that he has never smoked. He has never used smokeless  tobacco. He reports current alcohol use. He reports that he does not use drugs. family history includes Cancer in his paternal grandmother; Diabetes in his father; Glaucoma in his father; Heart attack in his maternal uncle; Heart attack (age of onset: 51) in his mother; Heart attack (age of onset: 15) in his maternal grandmother; Stroke (age of onset: 38) in his father. Allergies  Allergen Reactions  . Aleve [Naproxen] Rash    Patient stated it was years ago  . Nsaids Other (See Comments)    Bleeding ulcers   Current Outpatient Medications on File Prior to Visit  Medication Sig Dispense Refill  . acetaminophen (TYLENOL) 500 MG tablet Take 1,000 mg by mouth every 6 (six) hours as needed for moderate pain.    Marland Kitchen albuterol (PROVENTIL HFA;VENTOLIN HFA) 108 (90 Base) MCG/ACT inhaler Inhale 2 puffs into the lungs every 6 (six) hours as needed. For shortness of breath 1 Inhaler 1  . aspirin EC 81 MG tablet Take 81  mg by mouth daily.    . Brinzolamide-Brimonidine 1-0.2 % SUSP Place 1 drop into the left eye 2 (two) times daily.     . Cholecalciferol (VITAMIN D3 PO) Take 1 tablet by mouth daily.    . fluticasone (FLONASE) 50 MCG/ACT nasal spray Place 2 sprays into both nostrils daily as needed for allergies. 16 g 3  . metaxalone (SKELAXIN) 800 MG tablet Take 800 mg by mouth 3 (three) times daily as needed.     . Multiple Vitamin (MULTIVITAMIN) tablet Take 1 tablet by mouth daily.    . OXcarbazepine (TRILEPTAL) 150 MG tablet Take 150 mg by mouth as needed (FOR ANXIETY/PTSD).    Marland Kitchen PARoxetine (PAXIL) 10 MG tablet Take 10 mg by mouth daily as needed (anxiety).     . predniSONE (STERAPRED UNI-PAK 21 TAB) 10 MG (21) TBPK tablet Take as instructed on package (60, 50, 40, 30, 20, 10) 1 each 0  . SYMBICORT 160-4.5 MCG/ACT inhaler Inhale 2 puffs into the lungs in the morning and at bedtime.    . valACYclovir (VALTREX) 1000 MG tablet TAKE 1/2 TABLET BY MOUTH TWICE DAILY FOR 3 DAYS OR 1 TABLET BY MOUTH EVERY DAY FOR 5 DAYS AT ONSET OF BREAKOUT 90 tablet 1  . [DISCONTINUED] omeprazole (PRILOSEC) 20 MG capsule Take 1 capsule (20 mg total) by mouth daily. 30 capsule 0  . [DISCONTINUED] topiramate (TOPAMAX) 25 MG tablet topiramate 25 mg tablet (Patient not taking: No sig reported)     No current facility-administered medications on file prior to visit.        ROS:  All others reviewed and negative.  Objective        PE:  BP 110/80 (BP Location: Left Arm, Patient Position: Sitting, Cuff Size: Normal)   Pulse 73   Temp 98.4 F (36.9 C) (Oral)   Ht 5\' 7"  (1.702 m)   Wt 211 lb 3.2 oz (95.8 kg)   SpO2 95%   BMI 33.08 kg/m                 Constitutional: Pt appears in NAD               HENT: Head: NCAT.                Right Ear: External ear normal.                 Left Ear: External ear normal.  Eyes: . Pupils are equal, round, and reactive to light. Conjunctivae and EOM are normal               Nose:  without d/c or deformity               Neck: Neck supple. Gross normal ROM               Cardiovascular: Normal rate and regular rhythm.                 Pulmonary/Chest: Effort normal and breath sounds without rales or wheezing.                Abd:  Soft, NT, ND, + BS, no organomegaly               Neurological: Pt is alert. At baseline orientation, motor grossly intact               Skin: Skin is warm. No rashes, no other new lesions, LE edema - none               Psychiatric: Pt behavior is normal without agitation   Micro: none  Cardiac tracings I have personally interpreted today:  none  Pertinent Radiological findings (summarize): none   Lab Results  Component Value Date   WBC 4.7 06/25/2020   HGB 14.7 06/25/2020   HCT 43.7 06/25/2020   PLT 250.0 06/25/2020   GLUCOSE 92 06/25/2020   CHOL 199 06/25/2020   TRIG 101.0 06/25/2020   HDL 51.50 06/25/2020   LDLDIRECT 89.0 04/20/2019   LDLCALC 127 (H) 06/25/2020   ALT 20 06/25/2020   AST 16 06/25/2020   NA 137 06/25/2020   K 4.3 06/25/2020   CL 103 06/25/2020   CREATININE 0.95 06/25/2020   BUN 10 06/25/2020   CO2 27 06/25/2020   TSH 0.70 06/25/2020   PSA 0.66 06/25/2020   HGBA1C 5.9 06/25/2020   Assessment/Plan:  Anthony [name] is a 55 y.o. Black or African American [2] male with  has a past medical history of Anemia, Anxiety, Asthma, Cataract, Complication of anesthesia, Depression, DVT (deep venous thrombosis) ([place]), Fatty liver, Genital herpes (05/06/2018), H/O renal calculi (2006), Headache, History of kidney stones, History of methicillin resistant staphylococcus aureus (MRSA), Hyperlipidemia, Internal hemorrhoid, PE (pulmonary embolism), Peptic ulcer disease, Pneumonia, PTSD (post-traumatic stress disorder), Sleep apnea, Spondylosis, and Tubular adenoma of colon (2011).  Depression Mild chronic, declies change in med tx for now  Encounter for well adult exam with abnormal findings Age and sex appropriate education and  counseling updated with regular exercise and diet Referrals for preventative services - none needed Immunizations addressed - plans for covid booster soon, declines flu shot Smoking counseling  - none needed Evidence for depression or other mood disorder - none significant Most recent labs reviewed. I have personally reviewed and have noted: 1) the patient's medical and social history 2) The patient's current medications and supplements 3) The patient's height, weight, and BMI have been recorded in the chart   PTSD (post-traumatic stress disorder) Chronic persistent, decliens further med tx or counseling referral,, plans to f/u at [facility], continue paxil, trileptal  HLD (hyperlipidemia)  Stable, pt to continue diet, consider statin restart  Hyperglycemia Lab Results  Component Value Date   HGBA1C 5.9 06/25/2020   Stable, pt to continue current medical treatment  - diet   Meralgia paresthetica of left side Mild d/w pt - avoid tight fitting clothes  Followup: Return in about  6 months (around 12/23/2020).  Cathlean Cower, MD 07/03/2020 10:57 PM [facility]

## 2020-06-25 NOTE — Assessment & Plan Note (Signed)
Mild chronic, declies change in med tx for now

## 2020-06-26 LAB — HEPATITIS C ANTIBODY
Hepatitis C Ab: NONREACTIVE
SIGNAL TO CUT-OFF: 0.01 (ref <1.00)

## 2020-06-30 ENCOUNTER — Encounter: Payer: Self-pay | Admitting: Internal Medicine

## 2020-06-30 NOTE — Assessment & Plan Note (Signed)
Managed by [facility]. I've encouraged him to speak with them about comfort issues.

## 2020-06-30 NOTE — Assessment & Plan Note (Signed)
Feels well controlled on Symbicort with infrequent need for rescue inhaler Plan- continue current meds

## 2020-06-30 NOTE — Assessment & Plan Note (Signed)
Can use flonase and otc antihistamine as needed during pollen season this Spring.

## 2020-07-03 ENCOUNTER — Encounter: Payer: Self-pay | Admitting: Internal Medicine

## 2020-07-03 DIAGNOSIS — G5712 Meralgia paresthetica, left lower limb: Secondary | ICD-10-CM | POA: Insufficient documentation

## 2020-07-03 NOTE — Assessment & Plan Note (Signed)
Age and sex appropriate education and counseling updated with regular exercise and diet Referrals for preventative services - none needed Immunizations addressed - plans for covid booster soon, declines flu shot Smoking counseling  - none needed Evidence for depression or other mood disorder - none significant Most recent labs reviewed. I have personally reviewed and have noted: 1) the patient's medical and social history 2) The patient's current medications and supplements 3) The patient's height, weight, and BMI have been recorded in the chart

## 2020-07-03 NOTE — Assessment & Plan Note (Signed)
  Stable, pt to continue diet, consider statin restart

## 2020-07-03 NOTE — Assessment & Plan Note (Addendum)
Chronic persistent, decliens further med tx or counseling referral,, plans to f/u at [facility], continue paxil, trileptal

## 2020-07-03 NOTE — Assessment & Plan Note (Signed)
Lab Results  Component Value Date   HGBA1C 5.9 06/25/2020   Stable, pt to continue current medical treatment  - diet

## 2020-07-03 NOTE — Assessment & Plan Note (Signed)
Mild d/w pt - avoid tight fitting clothes

## 2020-07-04 ENCOUNTER — Encounter: Payer: Federal, State, Local not specified - PPO | Admitting: Gastroenterology

## 2020-07-24 ENCOUNTER — Encounter: Payer: Self-pay | Admitting: Gastroenterology

## 2020-07-25 DIAGNOSIS — Z0189 Encounter for other specified special examinations: Secondary | ICD-10-CM | POA: Diagnosis not present

## 2020-07-25 DIAGNOSIS — Z87448 Personal history of other diseases of urinary system: Secondary | ICD-10-CM | POA: Diagnosis not present

## 2020-07-25 DIAGNOSIS — F5221 Male erectile disorder: Secondary | ICD-10-CM | POA: Diagnosis not present

## 2020-07-25 DIAGNOSIS — N529 Male erectile dysfunction, unspecified: Secondary | ICD-10-CM | POA: Diagnosis not present

## 2020-08-01 ENCOUNTER — Other Ambulatory Visit: Payer: Self-pay

## 2020-08-01 ENCOUNTER — Ambulatory Visit (AMBULATORY_SURGERY_CENTER): Payer: Self-pay | Admitting: *Deleted

## 2020-08-01 VITALS — Ht 66.75 in | Wt 213.0 lb

## 2020-08-01 DIAGNOSIS — Z86010 Personal history of colonic polyps: Secondary | ICD-10-CM

## 2020-08-01 DIAGNOSIS — D126 Benign neoplasm of colon, unspecified: Secondary | ICD-10-CM

## 2020-08-01 DIAGNOSIS — K625 Hemorrhage of anus and rectum: Secondary | ICD-10-CM

## 2020-08-01 MED ORDER — SUPREP BOWEL PREP KIT 17.5-3.13-1.6 GM/177ML PO SOLN: 1.0000 | Freq: Once | ORAL | 0 refills | Status: AC

## 2020-08-01 NOTE — Progress Notes (Signed)

## 2020-08-13 ENCOUNTER — Telehealth: Payer: Self-pay | Admitting: Gastroenterology

## 2020-08-13 NOTE — Telephone Encounter (Signed)
Patient called and stated he started his prep this morning and was having some BM but now not so much is seeking advise to make sure he is prepping correctly.

## 2020-08-13 NOTE — Telephone Encounter (Addendum)
Pt stated that he started his 1st half of his suprep this morning at [facility] pt to pick up 238mg  bottle of miralax, and to mix with 32oz of gatorade this evening starting at 6pm. Instructed pt to drink 8oz of the miralax/gatorade mix every 15 minutes to prevent nausea. Instructed pt to drink fluids as tolerated for rest of evening to help him produce bowel movements. Also advised pt that he will have to drink the other half of the suprep tomorrow morning beginning at 1030am. Pt verbalized understanding, will call [facility] if he has any further concerns.

## 2020-08-14 ENCOUNTER — Other Ambulatory Visit: Payer: Self-pay

## 2020-08-14 ENCOUNTER — Encounter: Payer: Self-pay | Admitting: Gastroenterology

## 2020-08-14 ENCOUNTER — Ambulatory Visit (AMBULATORY_SURGERY_CENTER): Payer: Federal, State, Local not specified - PPO | Admitting: Gastroenterology

## 2020-08-14 VITALS — BP 91/67 | HR 76 | Temp 98.3°F | Resp 26 | Ht 66.75 in | Wt 213.0 lb

## 2020-08-14 DIAGNOSIS — K625 Hemorrhage of anus and rectum: Secondary | ICD-10-CM | POA: Diagnosis not present

## 2020-08-14 DIAGNOSIS — Z1211 Encounter for screening for malignant neoplasm of colon: Secondary | ICD-10-CM | POA: Diagnosis not present

## 2020-08-14 MED ORDER — SODIUM CHLORIDE 0.9 % IV SOLN: 500.0000 mL | Freq: Once | INTRAVENOUS | Status: DC

## 2020-08-14 NOTE — Progress Notes (Signed)
VS-CW  Pt's states no medical or surgical changes since previsit or office visit.  

## 2020-08-14 NOTE — Progress Notes (Signed)
1533- pt states he would like his girlfriend to come back to the [facility].  I explained we had made him a HIPAA pt d/t his request, but he still asks her to come back.

## 2020-08-14 NOTE — Progress Notes (Signed)
Report given to PACU, vss 

## 2020-08-14 NOTE — Patient Instructions (Signed)
Normal colonoscopy! Next colonoscopy 10 years  Continue your normal medications  YOU HAD AN ENDOSCOPIC PROCEDURE TODAY AT [facility]:   Refer to the procedure report that was given to you for any specific questions about what was found during the examination.  If the procedure report does not answer your questions, please call your gastroenterologist to clarify.  If you requested that your care partner not be given the details of your procedure findings, then the procedure report has been included in a sealed envelope for you to review at your convenience later.  YOU SHOULD EXPECT: Some feelings of bloating in the abdomen. Passage of more gas than usual.  Walking can help get rid of the air that was put into your GI tract during the procedure and reduce the bloating. If you had a lower endoscopy (such as a colonoscopy or flexible sigmoidoscopy) you may notice spotting of blood in your stool or on the toilet paper. If you underwent a bowel prep for your procedure, you may not have a normal bowel movement for a few days.  Please Note:  You might notice some irritation and congestion in your nose or some drainage.  This is from the oxygen used during your procedure.  There is no need for concern and it should clear up in a day or so.  SYMPTOMS TO REPORT IMMEDIATELY:   Following lower endoscopy (colonoscopy or flexible sigmoidoscopy):  Excessive amounts of blood in the stool  Significant tenderness or worsening of abdominal pains  Swelling of the abdomen that is new, acute  Fever of 100F or higher  For urgent or emergent issues, a gastroenterologist can be reached at any hour by calling [phone]. Do not use MyChart messaging for urgent concerns.    DIET:  We do recommend a small meal at first, but then you may proceed to your regular diet.  Drink plenty of fluids but you should avoid alcoholic beverages for 24 hours.  ACTIVITY:  You should plan to take it easy for the  rest of today and you should NOT DRIVE or use heavy machinery until tomorrow (because of the sedation medicines used during the test).    FOLLOW UP: Our staff will call the number listed on your records 48-72 hours following your procedure to check on you and address any questions or concerns that you may have regarding the information given to you following your procedure. If we do not reach you, we will leave a message.  We will attempt to reach you two times.  During this call, we will ask if you have developed any symptoms of COVID 19. If you develop any symptoms (ie: fever, flu-like symptoms, shortness of breath, cough etc.) before then, please call [phone].  If you test positive for Covid 19 in the 2 weeks post procedure, please call and report this information to [place].     SIGNATURES/CONFIDENTIALITY: You and/or your care partner have signed paperwork which will be entered into your electronic medical record.  These signatures attest to the fact that that the information above on your After Visit Summary has been reviewed and is understood.  Full responsibility of the confidentiality of this discharge information lies with you and/or your care-partner.

## 2020-08-14 NOTE — Op Note (Signed)
[facility] Patient Name: Anthony [name] Procedure Date: 08/14/2020 2:32 PM MRN: [id number] Endoscopist: Mallie Mussel L. Loletha Carrow , MD Age: 55 Referring MD:  Date of Birth: 11-Feb-1966 Gender: Male Account #: [id number] Procedure:                Colonoscopy Indications:              Rectal bleeding (intermittent) - ( diminutive                            adenoma 2011, no polyps on colonoscopy at outside                            clinic 2016) Medicines:                Monitored Anesthesia Care Procedure:                Pre-Anesthesia Assessment:                           - Prior to the procedure, a History and Physical                            was performed, and patient medications and                            allergies were reviewed. The patient's tolerance of                            previous anesthesia was also reviewed. The risks                            and benefits of the procedure and the sedation                            options and risks were discussed with the patient.                            All questions were answered, and informed consent                            was obtained. Prior Anticoagulants: The patient has                            taken no previous anticoagulant or antiplatelet                            agents. ASA Grade Assessment: II - A patient with                            mild systemic disease. After reviewing the risks                            and benefits, the patient was deemed in  satisfactory condition to undergo the procedure.                           After obtaining informed consent, the colonoscope                            was passed under direct vision. Throughout the                            procedure, the patient's blood pressure, pulse, and                            oxygen saturations were monitored continuously. The                            Olympus CF-HQ190L [id number]) Colonoscope was                             introduced through the anus and advanced to the the                            terminal ileum, with identification of the                            appendiceal orifice and IC valve. The colonoscopy                            was performed without difficulty. The patient                            tolerated the procedure well. The quality of the                            bowel preparation was good. The terminal ileum,                            ileocecal valve, appendiceal orifice, and rectum                            were photographed. Scope In: 3:13:37 PM Scope Out: 3:24:43 PM Scope Withdrawal Time: 0 hours 10 minutes 1 second  Total Procedure Duration: 0 hours 11 minutes 6 seconds  Findings:                 The perianal and digital rectal examinations were                            normal.                           The entire examined colon appeared normal on direct                            and retroflexion views. Complications:            No immediate complications. Estimated Blood  Loss:     Estimated blood loss: none. Impression:               - The entire examined colon is normal on direct and                            retroflexion views.                           - No specimens collected.                           Benign anal bleeding. Recommendation:           - Patient has a contact number available for                            emergencies. The signs and symptoms of potential                            delayed complications were discussed with the                            patient. Return to normal activities tomorrow.                            Written discharge instructions were provided to the                            patient.                           - Resume previous diet.                           - Continue present medications.                           - Repeat colonoscopy in 10 years for screening                             purposes. Kioni Stahl L. Loletha Carrow, MD 08/14/2020 3:27:31 PM This report has been signed electronically.

## 2020-08-19 ENCOUNTER — Telehealth: Payer: Self-pay

## 2020-08-19 NOTE — Telephone Encounter (Signed)
Left message on answering machine. 

## 2020-08-22 DIAGNOSIS — M2141 Flat foot [pes planus] (acquired), right foot: Secondary | ICD-10-CM | POA: Diagnosis not present

## 2020-08-22 DIAGNOSIS — M2142 Flat foot [pes planus] (acquired), left foot: Secondary | ICD-10-CM | POA: Diagnosis not present

## 2020-08-22 DIAGNOSIS — M21611 Bunion of right foot: Secondary | ICD-10-CM | POA: Diagnosis not present

## 2020-08-22 DIAGNOSIS — M2011 Hallux valgus (acquired), right foot: Secondary | ICD-10-CM | POA: Diagnosis not present

## 2020-08-22 DIAGNOSIS — L603 Nail dystrophy: Secondary | ICD-10-CM | POA: Diagnosis not present

## 2020-08-22 DIAGNOSIS — M19011 Primary osteoarthritis, right shoulder: Secondary | ICD-10-CM | POA: Diagnosis not present

## 2020-08-22 DIAGNOSIS — M7731 Calcaneal spur, right foot: Secondary | ICD-10-CM | POA: Diagnosis not present

## 2020-08-22 DIAGNOSIS — B351 Tinea unguium: Secondary | ICD-10-CM | POA: Diagnosis not present

## 2020-09-05 ENCOUNTER — Ambulatory Visit (INDEPENDENT_AMBULATORY_CARE_PROVIDER_SITE_OTHER): Payer: Medicare Other | Admitting: Urology

## 2020-09-05 ENCOUNTER — Encounter: Payer: Self-pay | Admitting: Urology

## 2020-09-05 ENCOUNTER — Other Ambulatory Visit: Payer: Self-pay

## 2020-09-05 VITALS — BP 108/82 | HR 71 | Ht 67.0 in | Wt 210.0 lb

## 2020-09-05 DIAGNOSIS — N529 Male erectile dysfunction, unspecified: Secondary | ICD-10-CM | POA: Diagnosis not present

## 2020-09-05 DIAGNOSIS — Z125 Encounter for screening for malignant neoplasm of prostate: Secondary | ICD-10-CM | POA: Diagnosis not present

## 2020-09-05 DIAGNOSIS — N4283 Cyst of prostate: Secondary | ICD-10-CM | POA: Diagnosis not present

## 2020-09-05 NOTE — Progress Notes (Signed)
   09/05/2020 12:48 PM   Anthony [name] 06-23-1965 [id number]  Reason for visit: Follow up dysuria, prostate cyst, ED, PSA screening  HPI: 55 year old male with history of urinary symptoms of dysuria who was found to have a prostatic cyst, and ultimately underwent TUR of this lesion with me in October 2021.  Pathology showed only cystitis cystica.  This has resolved his burning with urination and other urinary symptoms, and he continues to do well.  He denies any complaints today.  PVR is normal at 2 mL.  He has ED that is responsive to 100 mg sildenafil, and this is prescribed by the [place].  He would like to continue to follow-up with [place] for routine PSA screening and urinary symptom check.  Most recent PSA was normal at 0.66 in February 2022.  RTC 1 year PSA, PVR   Billey Co, MD  [name] [address], [place], [place] [address] [phone]

## 2020-09-06 DIAGNOSIS — L209 Atopic dermatitis, unspecified: Secondary | ICD-10-CM | POA: Diagnosis not present

## 2020-09-13 ENCOUNTER — Encounter: Payer: Self-pay | Admitting: Emergency Medicine

## 2020-09-13 ENCOUNTER — Ambulatory Visit
Admission: EM | Admit: 2020-09-13 | Discharge: 2020-09-13 | Disposition: A | Discharge status: Home / Self Care | Payer: Federal, State, Local not specified - PPO | Attending: Sports Medicine | Admitting: Sports Medicine

## 2020-09-13 ENCOUNTER — Encounter: Payer: Self-pay | Admitting: Internal Medicine

## 2020-09-13 ENCOUNTER — Other Ambulatory Visit: Payer: Self-pay

## 2020-09-13 DIAGNOSIS — R0789 Other chest pain: Secondary | ICD-10-CM | POA: Diagnosis not present

## 2020-09-13 DIAGNOSIS — M940 Chondrocostal junction syndrome [Tietze]: Secondary | ICD-10-CM

## 2020-09-13 NOTE — Discharge Instructions (Addendum)
As we discussed, your exam is consistent with costochondritis or atypical chest pain.  Please see educational handouts. If symptoms were to worsen please call 911 or go to the emergency room. Your EKG did not show anything concerning.  Please follow-up with your primary care provider if your symptoms persist.

## 2020-09-13 NOTE — ED Triage Notes (Addendum)
Pt is present today chest sore that he experiences with exertion . Pt states that he noticed the chest soreness today.

## 2020-09-16 NOTE — ED Provider Notes (Signed)
[facility]    CSN: [id number] Arrival date & time: 09/13/20  1856      History   Chief Complaint Chief Complaint  Patient presents with  . Muscle Pain    Chest soreness    HPI Anthony [name] is a 55 y.o. male.   Patient is a pleasant 55 year old male who presents for evaluation of the above issue.  He normally sees Dr. Cathlean Cower for his ongoing primary care needs.  He also goes to the [place].  He actually was at the [place] earlier today.  He reports that he has been coughing today and has a little bit of chest soreness.  Points over the left side of his chest and over the rib cage.  He was concerned because the chest pain may be cardiac.  He denies any diaphoresis jaw pain impending sense of doom.  No substernal chest pain.  Does have a remote history of a DVT and a small PE.  Was managed appropriately and weaned off of anticoagulation.  He just takes a baby aspirin now.  Also history of peptic ulcer disease with a bleeding ulcer in the past and cannot take anti-inflammatories.  No wheezing, shortness of breath, or substernal chest pain.  No red flag signs or symptoms elicited on history.     Past Medical History:  Diagnosis Date  . Allergy    Past hx   . Anemia   . Anxiety   . Asthma   . Cataract    removed left- forming on the right  DVT and PE from long travel trip   . Chronic kidney disease 2006   kidney stones   . Clotting disorder ([place]) 2016   PE - small   . Complication of anesthesia    HARD TO WAKE UP AFTER 2ND EYE SURGERY  . COVID-19 virus infection   . Depression   . DVT (deep venous thrombosis) ([facility])    DUE TO A LONG TRAVEL  . Fatty liver   . Genital herpes 05/06/2018  . H/O renal calculi 2006  . Headache    MIGRAINES  . History of kidney stones    H/O  . History of methicillin resistant staphylococcus aureus (MRSA)   . Hyperlipidemia   . Internal hemorrhoid   . PE (pulmonary embolism)   . Peptic ulcer disease   . Pneumonia    h/o  . PTSD  (post-traumatic stress disorder)   . Sleep apnea    DOES NOT USE CPAP CURRENTLY-MASK DOES NOT FIT CORRECTLY [facility]13[facility]20[facility]  . Spondylosis   . Tubular adenoma of colon 2011   Dr Olevia Perches    Patient Active Problem List   Diagnosis Date Noted  . Meralgia paresthetica of left side 07/03/2020  . PTSD (post-traumatic stress disorder) 06/25/2020  . Ankle instability 11/23/2019  . Leg cramping 09/04/2019  . Pain in joint of right elbow 08/03/2019  . Lateral epicondylitis of right elbow 08/03/2019  . Nephrolithiasis 05/11/2019  . Dark stools 05/11/2019  . Epigastric pain 05/11/2019  . Other constipation 05/11/2019  . Right sided abdominal pain 05/11/2019  . Paresthesia 04/20/2019  . Depression 01/14/2019  . Left ear hearing loss 01/14/2019  . Low back pain 01/14/2019  . Intractable migraine with aura without status migrainosus 01/07/2019  . Genital herpes 05/06/2018  . STD exposure 05/06/2018  . Obstructive sleep apnea 02/13/2018  . Diarrhea 01/24/2018  . Hyperglycemia 01/24/2018  . Pelvic pain 07/10/2017  .  Exposure to blood or body fluid 07/07/2017  . Trigger thumb of right hand 05/21/2017  . Trigger finger, right middle finger 03/18/2017  . Anxiety 03/18/2017  . Chronic tension-type headache, not intractable 02/12/2017  . Herpes simplex type II infection 12/02/2016  . Dysuria 12/02/2016  . Pain of right thumb 11/13/2016  . Other chest pain 07/27/2016  . Onychomycosis 06/12/2016  . Encounter for well adult exam with abnormal findings 06/12/2016  . Generalized headache 05/15/2016  . Lumbar paraspinal muscle spasm 05/15/2016  . Cervical paraspinal muscle spasm 05/15/2016  . History of DVT (deep vein thrombosis) 11/18/2015  . Elevated PSA 11/18/2015  . Acute pulmonary embolism ([place]) 2016 05/09/2015  . Peptic ulcer 05/09/2015  . Dyspnea 05/02/2015  . DVT (deep venous thrombosis) ([place]) 04/26/2015  . Duodenal ulcer 04/25/2015  . Positive D dimer  04/23/2015  . Internal hemorrhoid, bleeding 04/04/2015  . Acute gastrointestinal hemorrhage 04/03/2015  . Duodenitis 04/03/2015  . Postural dizziness with presyncope 04/03/2015  . Microscopic hematuria 04/03/2015  . Asthma 12/31/2014  . Dislocated IOL (intraocular lens), initial encounter 12/10/2014  . MRSA infection 09/14/2013  . History of methicillin resistant Staphylococcus aureus infection 02/17/2013  . Spondylosis of lumbar joint 09/28/2012  . Fatty liver disease, nonalcoholic 0000000  . Lumbosacral spondylosis without myelopathy 09/28/2012  . Fatty metamorphosis of liver 09/28/2012  . Atopic dermatitis 05/13/2012  . Seasonal and perennial allergic rhinitis 10/01/2011  . Family history of ischemic heart disease 04/02/2011  . 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/12/2011  . COLONIC POLYPS, HX OF 06/21/2009  . HLD (hyperlipidemia) 02/16/2008  . Asthma, mild persistent 02/16/2008  . NONSPECIFIC ABNORMAL ELECTROCARDIOGRAM 02/16/2008  . NEPHROLITHIASIS, HX OF 02/16/2008    Past Surgical History:  Procedure Laterality Date  . CATARACT EXTRACTION Left 1996   Dr Elonda Husky; OS  . COLONOSCOPY    . colonoscopy with polypectomy  2011   Dr Olevia Perches, hemorrhoids  . CYSTOSCOPY W/ STONE MANIPULATION  2006  . EYE SURGERY Left   . INGUINAL HERNIA REPAIR Right 1990  . POLYPECTOMY    . SHOULDER SURGERY Right 12/17/2010   Dr French Ana ; shoulder impingement & torn tendon  . TRANSURETHRAL RESECTION OF PROSTATE N/A 02/23/2020   Procedure: TRANSURETHRAL RESECTION OF THE PROSTATE (TURP);  Surgeon: Billey Co, MD;  Location: [facility];  Service: Urology;  Laterality: N/A;       Home Medications    Prior to Admission medications   Medication Sig Start Date End Date Taking? Authorizing Provider  acetaminophen (TYLENOL) 500 MG tablet Take 1,000 mg by mouth every 6 (six) hours as needed for moderate pain.    [provider]  albuterol (PROVENTIL  HFA;VENTOLIN HFA) 108 (90 Base) MCG/ACT inhaler Inhale 2 puffs into the lungs every 6 (six) hours as needed. For shortness of breath 06/29/16   Martinique, Betty G, MD  aspirin EC 81 MG tablet Take 81 mg by mouth daily.    [provider]  atorvastatin (LIPITOR) 10 MG tablet Take 1 tablet (10 mg total) by mouth daily. 06/25/20 06/25/21  Biagio Borg, MD  Brinzolamide-Brimonidine 1-0.2 % SUSP Place 1 drop into the left eye 2 (two) times daily.     [provider]  Cholecalciferol (VITAMIN D3 PO) Take 1 tablet by mouth daily.    [provider]  fluticasone (FLONASE) 50 MCG/ACT nasal spray Place 2 sprays into both nostrils daily as needed for allergies. 04/29/18   Hoyt Koch, MD  Multiple Vitamin (  MULTIVITAMIN) tablet Take 1 tablet by mouth daily.    [provider]  OXcarbazepine (TRILEPTAL) 150 MG tablet Take 150 mg by mouth as needed (FOR ANXIETY/PTSD).    [provider]  PARoxetine (PAXIL) 40 MG tablet TAKE ONE-HALF TABLET BY MOUTH DAILY FOR MENTAL HEALTH 01/01/20   [provider]  sildenafil (VIAGRA) 100 MG tablet TAKE ONE TABLET BY MOUTH AS INSTRUCTED (TAKE 1 HOUR PRIOR TO SEXUAL ACTIVITY *DO NOT EXCEED 1 DOSE PER 24 HOUR PERIOD*) 07/12/20   [provider]  SYMBICORT 160-4.5 MCG/ACT inhaler Inhale 2 puffs into the lungs in the morning and at bedtime. 10/26/14   [provider]  tiZANidine (ZANAFLEX) 4 MG tablet  04/14/18   [provider]  valACYclovir (VALTREX) 1000 MG tablet TAKE 1/2 TABLET BY MOUTH TWICE DAILY FOR 3 DAYS OR 1 TABLET BY MOUTH EVERY DAY FOR 5 DAYS AT ONSET OF BREAKOUT 12/06/19   Biagio Borg, MD  omeprazole (PRILOSEC) 20 MG capsule Take 1 capsule (20 mg total) by mouth daily. 04/03/20 04/13/20  Faustino Congress, NP  topiramate (TOPAMAX) 25 MG tablet topiramate 25 mg tablet Patient not taking: No sig reported  04/13/20  [provider]    Family History Family History  Problem  Relation Age of Onset  . Heart attack Mother 46  . Stroke Father 31  . Diabetes Father        PVD  . Glaucoma Father        blindness  . Cancer Paternal Grandmother        ? primary  . Heart attack Maternal Uncle         X2,pre 50  . Esophageal cancer Maternal Uncle   . Heart attack Maternal Grandmother 73  . Asthma Neg Hx   . COPD Neg Hx   . Colon cancer Neg Hx   . Colon polyps Neg Hx   . Stomach cancer Neg Hx   . Rectal cancer Neg Hx     Social History Social History   Tobacco Use  . Smoking status: Never Smoker  . Smokeless tobacco: Never Used  Vaping Use  . Vaping Use: Never used  Substance Use Topics  . Alcohol use: Yes    Alcohol/week: 0.0 standard drinks    Comment: Socially , < 2X/ month  . Drug use: No     Allergies   Aleve [naproxen] and Nsaids   Review of Systems Review of Systems  Constitutional: Negative for activity change, appetite change, chills, diaphoresis, fatigue and fever.  HENT: Negative.  Negative for congestion, postnasal drip, rhinorrhea, sinus pressure, sinus pain and sore throat.   Eyes: Negative.  Negative for pain.  Respiratory: Positive for cough. Negative for chest tightness, shortness of breath, wheezing and stridor.   Cardiovascular: Negative for chest pain and palpitations.  Gastrointestinal: Negative.  Negative for abdominal pain, diarrhea, nausea and vomiting.  Genitourinary: Negative.  Negative for dysuria.  Musculoskeletal: Negative.  Negative for back pain and myalgias.  Skin: Negative.  Negative for color change, pallor, rash and wound.  Neurological: Negative.  Negative for dizziness, syncope, light-headedness, numbness and headaches.  Hematological: Negative.  Negative for adenopathy. Does not bruise/bleed easily.  All other systems reviewed and are negative.    Physical Exam Triage Vital Signs ED Triage Vitals  Enc Vitals Group     BP 09/13/20 1911 126/79     Pulse Rate 09/13/20 1911 80     Resp 09/13/20 1911  18  Temp 09/13/20 1911 98.4 F (36.9 C)     Temp src --      SpO2 09/13/20 1911 97 %     Weight --      Height --      Head Circumference --      Peak Flow --      Pain Score 09/13/20 1910 4     Pain Loc --      Pain Edu? --      Excl. in [place]? --    No data found.  Updated Vital Signs BP 126/79   Pulse 80   Temp 98.4 F (36.9 C)   Resp 18   SpO2 97%   Visual Acuity Right Eye Distance:   Left Eye Distance:   Bilateral Distance:    Right Eye Near:   Left Eye Near:    Bilateral Near:     Physical Exam Vitals and nursing note reviewed.  Constitutional:      General: He is not in acute distress.    Appearance: Normal appearance. He is not ill-appearing, toxic-appearing or diaphoretic.  HENT:     Head: Normocephalic and atraumatic.     Nose: Nose normal. No congestion or rhinorrhea.     Mouth/Throat:     Mouth: Mucous membranes are moist.     Pharynx: No oropharyngeal exudate or posterior oropharyngeal erythema.  Eyes:     General: No scleral icterus.       Right eye: No discharge.        Left eye: No discharge.     Extraocular Movements: Extraocular movements intact.     Conjunctiva/sclera: Conjunctivae normal.     Pupils: Pupils are equal, round, and reactive to light.  Neck:     Vascular: No carotid bruit.  Cardiovascular:     Rate and Rhythm: Normal rate and regular rhythm.  No extrasystoles are present.    Chest Wall: PMI is not displaced. No thrill.     Pulses: Normal pulses.          Carotid pulses are 2+ on the right side and 2+ on the left side.      Radial pulses are 2+ on the right side and 2+ on the left side.       Femoral pulses are 2+ on the right side and 2+ on the left side.      Popliteal pulses are 2+ on the right side and 2+ on the left side.       Dorsalis pedis pulses are 2+ on the right side and 2+ on the left side.       Posterior tibial pulses are 2+ on the right side and 2+ on the left side.     Heart sounds: Normal heart sounds, S1  normal and S2 normal. Heart sounds not distant. No murmur heard. No friction rub. No gallop.   Pulmonary:     Effort: Pulmonary effort is normal. No respiratory distress.     Breath sounds: Normal breath sounds. No stridor. No wheezing, rhonchi or rales.     Comments: His discomfort is reproducible to deep palpation midclavicular line around the 4th-6th ribs.  Is consistent with costochondritis versus atypical chest pain. Chest:     Chest wall: Tenderness present.  Musculoskeletal:     Cervical back: Normal range of motion and neck supple. No rigidity.     Right lower leg: No edema.     Left lower leg: No edema.  Skin:    General:  Skin is warm and dry.     Capillary Refill: Capillary refill takes less than 2 seconds.  Neurological:     General: No focal deficit present.     Mental Status: He is alert and oriented to person, place, and time.  Psychiatric:        Mood and Affect: Mood normal.      UC Treatments / Results  Labs (all labs ordered are listed, but only abnormal results are displayed) Labs Reviewed - No data to display  EKG Twelve-lead EKG done Sep 13, 2020 at 7:18 PM shows a ventricular rate of 80 bpm.  PR interval 172 ms.  QRS duration 74 ms.  QT and corrected QT of 300 4392 ms.  No acute ST or T wave changes.  Normal EKG.  Radiology No results found.  Procedures Procedures (including critical care time)  Medications Ordered in UC Medications - No data to display  Initial Impression / Assessment and Plan / UC Course  I have reviewed the triage vital signs and the nursing notes.  Pertinent labs & imaging results that were available during my care of the patient were reviewed by me and considered in my medical decision making (see chart for details).  Clinical impression: 1.  The findings and treatment plan were discussed in detail with the patient.  Patient was in agreement. 2.  We will get an EKG.  It was ordered and interpreted independently by myself in  the office.  Results are above.  No acute ST or T wave changes. 3.  His exam and history is consistent with atypical chest pain and costochondritis.  I did discuss with him that if he wanted to significant cardiac work-up done I would be happy to provide ambulance transportation to the ER so that they can do further work-up including labs and troponins.  He was fine going home and monitoring symptoms. 4.  Educational handouts provided. 5.  Without any benefit from anti-inflammatories for his costochondritis.  Unfortunately with his history of peptic ulcer disease and a bleeding ulcer he is not able to take that.  He will just use Tylenol. 6.  We discussed the red flag signs and symptoms and when to seek out immediate medical attention.  He voiced verbal understanding.  If he has any of that he knows to call 911 and go to the nearest emergency room. 7.  If his symptoms persist he should see his primary care provider. 8.  If they worsen he needs to go to the ER. 9.  He was discharged in stable condition and will follow-up here as needed.    Final Clinical Impressions(s) / UC Diagnoses   Final diagnoses:  Atypical chest pain  Costochondritis     Discharge Instructions     As we discussed, your exam is consistent with costochondritis or atypical chest pain.  Please see educational handouts. If symptoms were to worsen please call 911 or go to the emergency room. Your EKG did not show anything concerning.  Please follow-up with your primary care provider if your symptoms persist.    ED Prescriptions    None     PDMP not reviewed this encounter.   Verda Cumins, MD 09/16/20 [phone]

## 2020-09-22 ENCOUNTER — Ambulatory Visit
Admission: EM | Admit: 2020-09-22 | Discharge: 2020-09-22 | Disposition: A | Discharge status: Home / Self Care | Payer: No Typology Code available for payment source | Attending: Family Medicine | Admitting: Family Medicine

## 2020-09-22 ENCOUNTER — Other Ambulatory Visit: Payer: Self-pay

## 2020-09-22 DIAGNOSIS — N50819 Testicular pain, unspecified: Secondary | ICD-10-CM | POA: Insufficient documentation

## 2020-09-22 LAB — URINALYSIS, COMPLETE (UACMP) WITH MICROSCOPIC
Bilirubin Urine: NEGATIVE
Glucose, UA: NEGATIVE mg/dL
Hgb urine dipstick: NEGATIVE
Ketones, ur: NEGATIVE mg/dL
Leukocytes,Ua: NEGATIVE
Nitrite: NEGATIVE
Protein, ur: NEGATIVE mg/dL
Specific Gravity, Urine: 1.02 (ref 1.005–1.030)
pH: 7 (ref 5.0–8.0)

## 2020-09-22 LAB — CHLAMYDIA/NGC RT PCR (ARMC ONLY)
Chlamydia Tr: NOT DETECTED
N gonorrhoeae: NOT DETECTED

## 2020-09-22 NOTE — ED Triage Notes (Signed)
Patient states that he has been having groin pain and tenderness since Tuesday.

## 2020-09-22 NOTE — ED Provider Notes (Signed)
[facility]    CSN: [id number] Arrival date & time: 09/22/20  1014      History   Chief Complaint Chief Complaint  Patient presents with  . Groin Pain   HPI  55 year old male presents with the above complaint.  Patient reports that he is having some discomfort in his testicles.  He is unable to localize it to either testicle.  He states that it is essentially an odd sensation.  He has had some discomfort.  No urinary symptoms.  Denies any significant pain at this time.  Started on Tuesday.  No relieving factors.  No fever.  No other complaints.  Past Medical History:  Diagnosis Date  . Allergy    Past hx   . Anemia   . Anxiety   . Asthma   . Cataract    removed left- forming on the right  DVT and PE from long travel trip   . Chronic kidney disease 2006   kidney stones   . Clotting disorder ([place]) 2016   PE - small   . Complication of anesthesia    HARD TO WAKE UP AFTER 2ND EYE SURGERY  . COVID-19 virus infection   . Depression   . DVT (deep venous thrombosis) (HCC)    DUE TO A LONG TRAVEL  . Fatty liver   . Genital herpes 05/06/2018  . H/O renal calculi 2006  . Headache    MIGRAINES  . History of kidney stones    H/O  . History of methicillin resistant staphylococcus aureus (MRSA)   . Hyperlipidemia   . Internal hemorrhoid   . PE (pulmonary embolism)   . Peptic ulcer disease   . Pneumonia    h/o  . PTSD (post-traumatic stress disorder)   . Sleep apnea    DOES NOT USE CPAP CURRENTLY-MASK DOES NOT FIT CORRECTLY [facility]13[facility]20)  . Spondylosis   . Tubular adenoma of colon 2011   Dr Olevia Perches    Patient Active Problem List   Diagnosis Date Noted  . Meralgia paresthetica of left side 07/03/2020  . PTSD (post-traumatic stress disorder) 06/25/2020  . Leg cramping 09/04/2019  . Pain in joint of right elbow 08/03/2019  . Lateral epicondylitis of right elbow 08/03/2019  . Nephrolithiasis 05/11/2019  . Epigastric pain  05/11/2019  . Other constipation 05/11/2019  . Right sided abdominal pain 05/11/2019  . Paresthesia 04/20/2019  . Depression 01/14/2019  . Left ear hearing loss 01/14/2019  . Low back pain 01/14/2019  . Intractable migraine with aura without status migrainosus 01/07/2019  . Genital herpes 05/06/2018  . STD exposure 05/06/2018  . Obstructive sleep apnea 02/13/2018  . Hyperglycemia 01/24/2018  . Pelvic pain 07/10/2017  . Exposure to blood or body fluid 07/07/2017  . Trigger thumb of right hand 05/21/2017  . Trigger finger, right middle finger 03/18/2017  . Anxiety 03/18/2017  . Chronic tension-type headache, not intractable 02/12/2017  . Herpes simplex type II infection 12/02/2016  . Dysuria 12/02/2016  . Pain of right thumb 11/13/2016  . Other chest pain 07/27/2016  . Onychomycosis 06/12/2016  . Encounter for well adult exam with abnormal findings 06/12/2016  . Generalized headache 05/15/2016  . Lumbar paraspinal muscle spasm 05/15/2016  . History of DVT (deep vein thrombosis) 11/18/2015  . Elevated PSA 11/18/2015  . Acute pulmonary embolism ([place]) 2016 05/09/2015  . Peptic ulcer 05/09/2015  . Dyspnea 05/02/2015  . DVT (deep venous thrombosis) ([place]) 04/26/2015  .  Duodenal ulcer 04/25/2015  . Positive D dimer 04/23/2015  . Internal hemorrhoid, bleeding 04/04/2015  . Acute gastrointestinal hemorrhage 04/03/2015  . Duodenitis 04/03/2015  . Postural dizziness with presyncope 04/03/2015  . Microscopic hematuria 04/03/2015  . Dislocated IOL (intraocular lens), initial encounter 12/10/2014  . MRSA infection 09/14/2013  . History of methicillin resistant Staphylococcus aureus infection 02/17/2013  . Spondylosis of lumbar joint 09/28/2012  . Fatty liver disease, nonalcoholic 0000000  . Lumbosacral spondylosis without myelopathy 09/28/2012  . Fatty metamorphosis of liver 09/28/2012  . Atopic dermatitis 05/13/2012  . Seasonal and perennial allergic rhinitis 10/01/2011  . Family  history of ischemic heart disease 04/02/2011  . 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/12/2011  . COLONIC POLYPS, HX OF 06/21/2009  . HLD (hyperlipidemia) 02/16/2008  . Asthma, mild persistent 02/16/2008  . NONSPECIFIC ABNORMAL ELECTROCARDIOGRAM 02/16/2008  . NEPHROLITHIASIS, HX OF 02/16/2008    Past Surgical History:  Procedure Laterality Date  . CATARACT EXTRACTION Left 1996   Dr Elonda Husky; OS  . COLONOSCOPY    . colonoscopy with polypectomy  2011   Dr Olevia Perches, hemorrhoids  . CYSTOSCOPY W/ STONE MANIPULATION  2006  . EYE SURGERY Left   . INGUINAL HERNIA REPAIR Right 1990  . POLYPECTOMY    . SHOULDER SURGERY Right 12/17/2010   Dr French Ana ; shoulder impingement & torn tendon  . TRANSURETHRAL RESECTION OF PROSTATE N/A 02/23/2020   Procedure: TRANSURETHRAL RESECTION OF THE PROSTATE (TURP);  Surgeon: Billey Co, MD;  Location: [facility] ORS;  Service: Urology;  Laterality: N/A;       Home Medications    Prior to Admission medications   Medication Sig Start Date End Date Taking? Authorizing Provider  acetaminophen (TYLENOL) 500 MG tablet Take 1,000 mg by mouth every 6 (six) hours as needed for moderate pain.   Yes [provider]  albuterol (PROVENTIL HFA;VENTOLIN HFA) 108 (90 Base) MCG/ACT inhaler Inhale 2 puffs into the lungs every 6 (six) hours as needed. For shortness of breath 06/29/16  Yes [place], Betty G, MD  aspirin EC 81 MG tablet Take 81 mg by mouth daily.   Yes [provider]  atorvastatin (LIPITOR) 10 MG tablet Take 1 tablet (10 mg total) by mouth daily. 06/25/20 06/25/21 Yes Biagio Borg, MD  Brinzolamide-Brimonidine 1-0.2 % SUSP Place 1 drop into the left eye 2 (two) times daily.    Yes [provider]  Cholecalciferol (VITAMIN D3 PO) Take 1 tablet by mouth daily.   Yes [provider]  fluticasone (FLONASE) 50 MCG/ACT nasal spray Place 2 sprays into both nostrils daily as needed for allergies. 04/29/18  Yes  Hoyt Koch, MD  Multiple Vitamin (MULTIVITAMIN) tablet Take 1 tablet by mouth daily.   Yes [provider]  OXcarbazepine (TRILEPTAL) 150 MG tablet Take 150 mg by mouth as needed (FOR ANXIETY/PTSD).   Yes [provider]  PARoxetine (PAXIL) 40 MG tablet TAKE ONE-HALF TABLET BY MOUTH DAILY FOR MENTAL HEALTH 01/01/20  Yes [provider]  sildenafil (VIAGRA) 100 MG tablet TAKE ONE TABLET BY MOUTH AS INSTRUCTED (TAKE 1 HOUR PRIOR TO SEXUAL ACTIVITY *DO NOT EXCEED 1 DOSE PER 24 HOUR PERIOD*) 07/12/20  Yes [provider]  SYMBICORT 160-4.5 MCG/ACT inhaler Inhale 2 puffs into the lungs in the morning and at bedtime. 10/26/14  Yes [provider]  tiZANidine (ZANAFLEX) 4 MG tablet  04/14/18  Yes [provider]  valACYclovir (VALTREX) 1000 MG tablet TAKE 1/2 TABLET BY MOUTH TWICE  DAILY FOR 3 DAYS OR 1 TABLET BY MOUTH EVERY DAY FOR 5 DAYS AT ONSET OF BREAKOUT 12/06/19  Yes Biagio Borg, MD  omeprazole (PRILOSEC) 20 MG capsule Take 1 capsule (20 mg total) by mouth daily. 04/03/20 04/13/20  Faustino Congress, NP  topiramate (TOPAMAX) 25 MG tablet topiramate 25 mg tablet Patient not taking: No sig reported  04/13/20  [provider]    Family History Family History  Problem Relation Age of Onset  . Heart attack Mother 51  . Stroke Father 36  . Diabetes Father        PVD  . Glaucoma Father        blindness  . Cancer Paternal Grandmother        ? primary  . Heart attack Maternal Uncle         X2,pre 57  . Esophageal cancer Maternal Uncle   . Heart attack Maternal Grandmother 73  . Asthma Neg Hx   . COPD Neg Hx   . Colon cancer Neg Hx   . Colon polyps Neg Hx   . Stomach cancer Neg Hx   . Rectal cancer Neg Hx     Social History Social History   Tobacco Use  . Smoking status: Never Smoker  . Smokeless tobacco: Never Used  Vaping Use  . Vaping Use: Never used  Substance Use Topics  . Alcohol use: Yes    Alcohol/week:  0.0 standard drinks    Comment: Socially , < 2X/ month  . Drug use: No     Allergies   Aleve [naproxen] and Nsaids   Review of Systems Review of Systems Per HPI  Physical Exam Triage Vital Signs ED Triage Vitals  Enc Vitals Group     BP 09/22/20 1046 124/89     Pulse Rate 09/22/20 1046 68     Resp 09/22/20 1046 18     Temp 09/22/20 1046 98.3 F (36.8 C)     Temp Source 09/22/20 1046 Oral     SpO2 09/22/20 1046 99 %     Weight 09/22/20 1045 209 lb (94.8 kg)     Height 09/22/20 1045 5' 7.5" (1.715 m)     Head Circumference --      Peak Flow --      Pain Score 09/22/20 1045 0     Pain Loc --      Pain Edu? --      Excl. in [place]? --    Updated Vital Signs BP 124/89 (BP Location: Left Arm)   Pulse 68   Temp 98.3 F (36.8 C) (Oral)   Resp 18   Ht 5' 7.5" (1.715 m)   Wt 94.8 kg   SpO2 99%   BMI 32.25 kg/m   Visual Acuity Right Eye Distance:   Left Eye Distance:   Bilateral Distance:    Right Eye Near:   Left Eye Near:    Bilateral Near:     Physical Exam Constitutional:      General: He is not in acute distress.    Appearance: Normal appearance. He is not ill-appearing.  HENT:     Head: Normocephalic and atraumatic.  Eyes:     General:        Right eye: No discharge.        Left eye: No discharge.     Conjunctiva/sclera: Conjunctivae normal.  Pulmonary:     Effort: Pulmonary effort is normal. No respiratory distress.  Genitourinary:    Penis: Circumcised.  Testes: Normal.        Right: Mass or tenderness not present.        Left: Mass or tenderness not present.  Neurological:     Mental Status: He is alert.  Psychiatric:        Mood and Affect: Mood normal.        Behavior: Behavior normal.    UC Treatments / Results  Labs (all labs ordered are listed, but only abnormal results are displayed) Labs Reviewed  URINALYSIS, COMPLETE (UACMP) WITH MICROSCOPIC - Abnormal; Notable for the following components:      Result Value   Bacteria, UA  FEW (*)    All other components within normal limits  CHLAMYDIA/NGC RT PCR ([facility] ONLY)  URINE CULTURE    EKG   Radiology No results found.  Procedures Procedures (including critical care time)  Medications Ordered in UC Medications - No data to display  Initial Impression / Assessment and Plan / UC Course  I have reviewed the triage vital signs and the nursing notes.  Pertinent labs & imaging results that were available during my care of the patient were reviewed by me and considered in my medical decision making (see chart for details).    55 year old male presents with testicle pain/an odd sensation in his testicles.  His exam is normal.  His urinalysis is not consistent with UTI.  Sending culture.  Advise over-the-counter Tylenol and ibuprofen.  Follow-up with urology.  Final Clinical Impressions(s) / UC Diagnoses   Final diagnoses:  Pain in testicle, unspecified laterality     Discharge Instructions     No evidence of infection.  OTC tylenol and ibuprofen as needed.  Follow up with Urology.  Take care  Dr. Lacinda Axon     ED Prescriptions    None     PDMP not reviewed this encounter.   Coral Spikes, [place] 09/22/20 1137

## 2020-09-22 NOTE — Discharge Instructions (Addendum)
No evidence of infection.  OTC tylenol and ibuprofen as needed.  Follow up with Urology.  Take care  Dr. Lacinda Axon

## 2020-09-23 LAB — URINE CULTURE: Culture: NO GROWTH

## 2020-09-26 IMAGING — US US ABDOMEN COMPLETE
1 series · 14 of 25 positions shown · non-contrast
Comparison: CT scan of September 15, 2012.

CLINICAL DATA: Right upper quadrant abdominal pain.

EXAM:
ABDOMEN ULTRASOUND COMPLETE

[Series 1: us abdomen complete · 0.17mm/px · 14 of 81 slices shown]
[im 1/81]
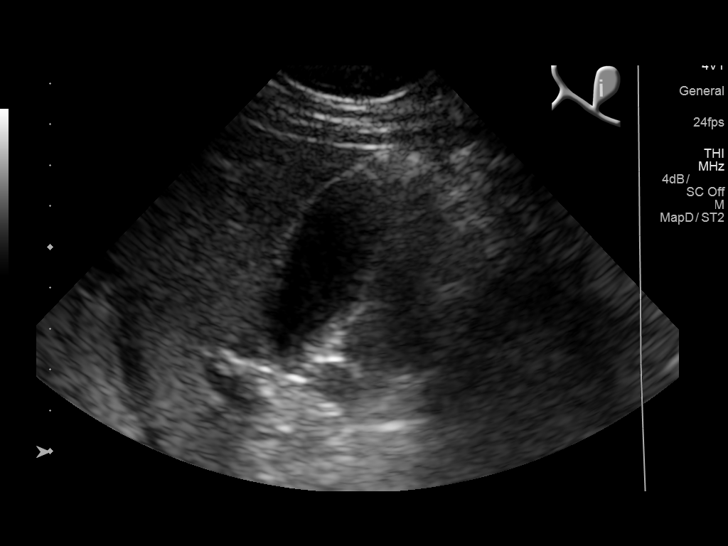
[im 7/81]
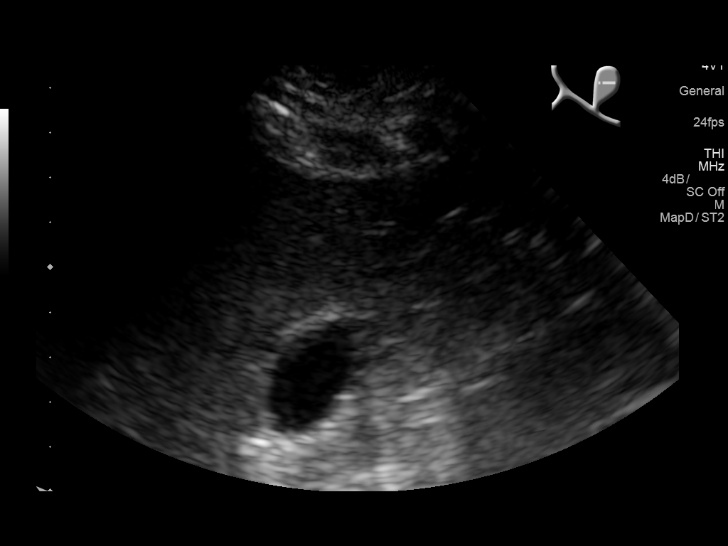
[im 14/81]
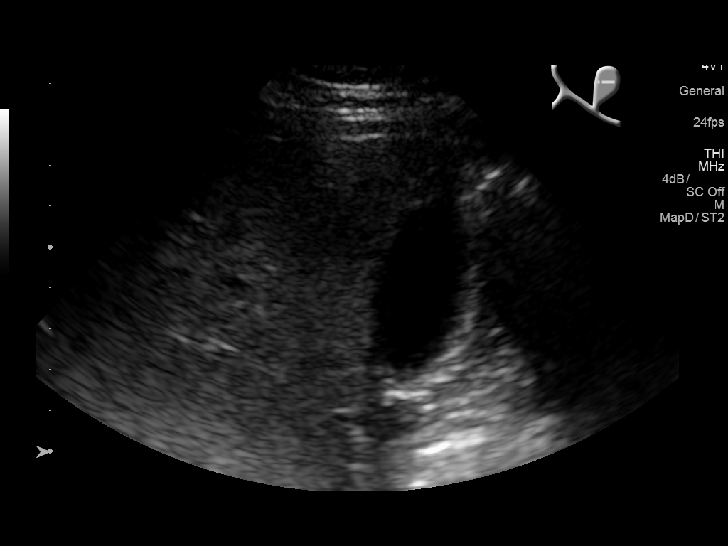
[im 21/81]
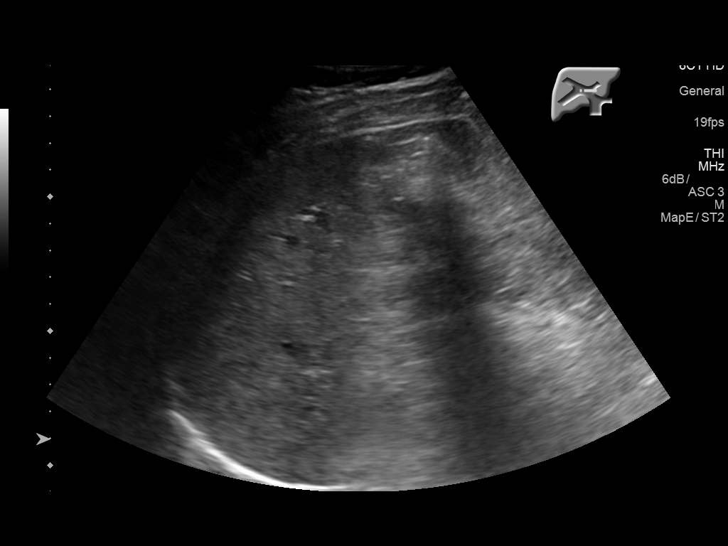
[im 27/81]
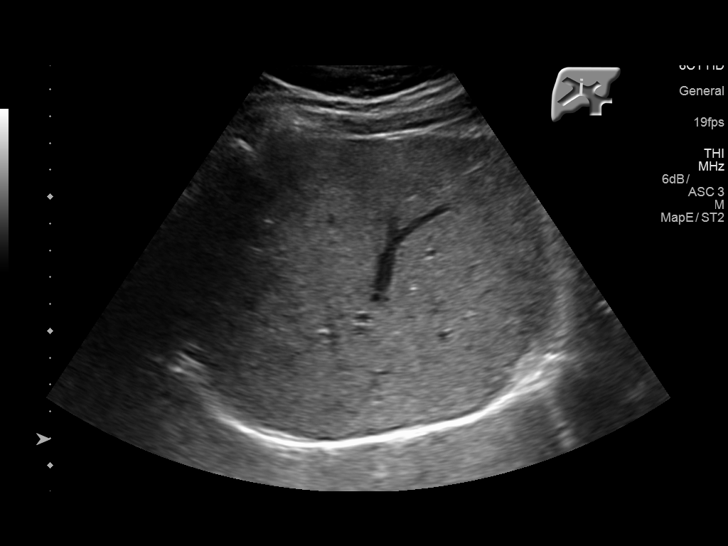
[im 31/81]
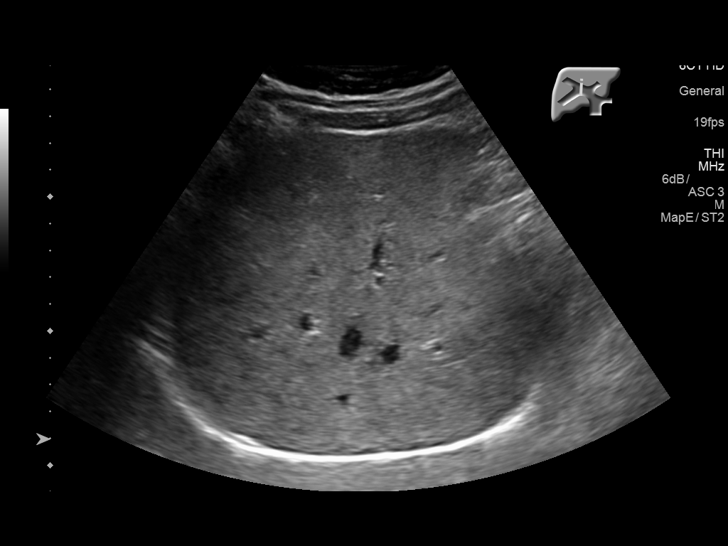
[im 37/81]
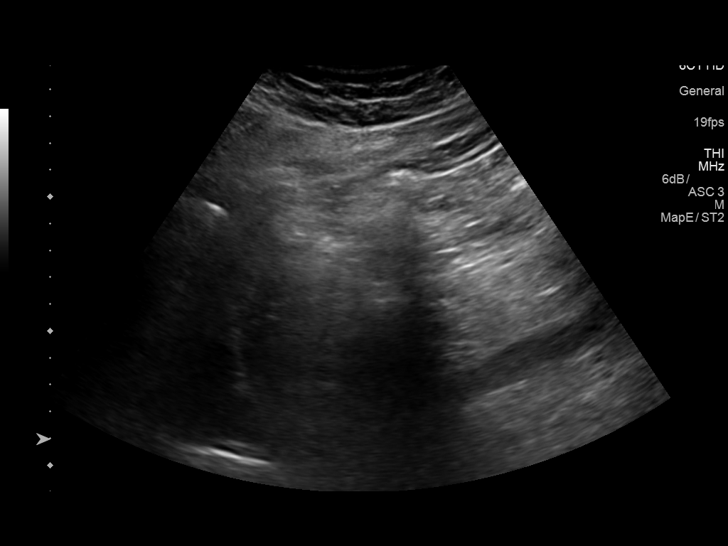
[im 44/81]
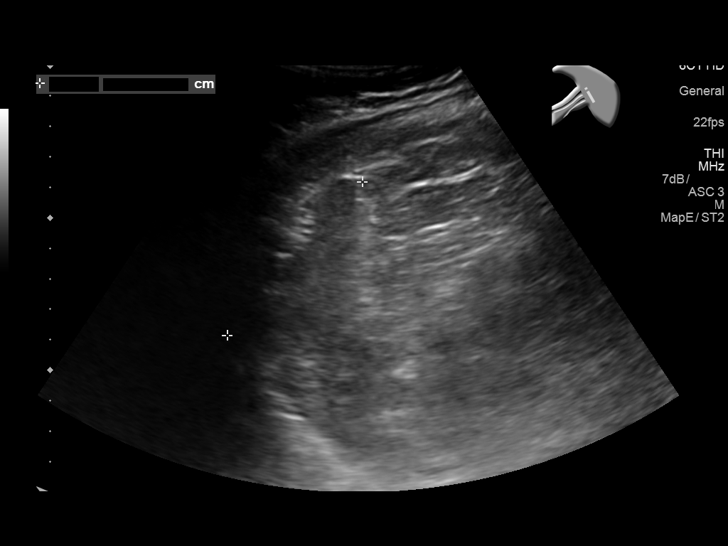
[im 51/81]
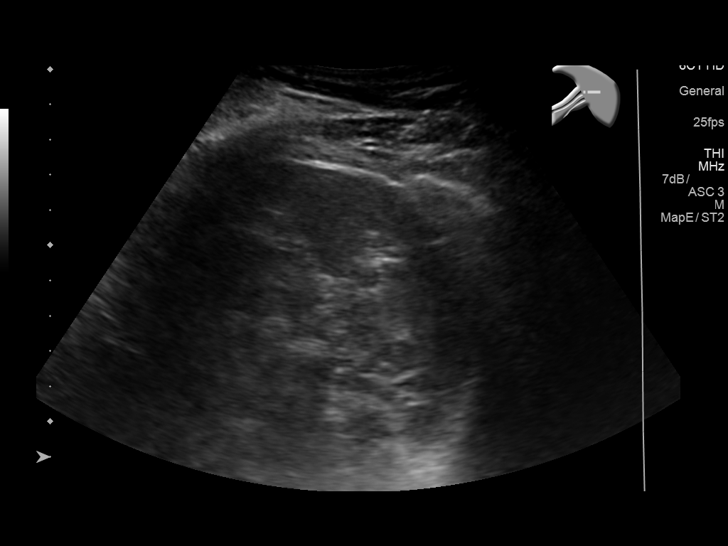
[im 54/81]
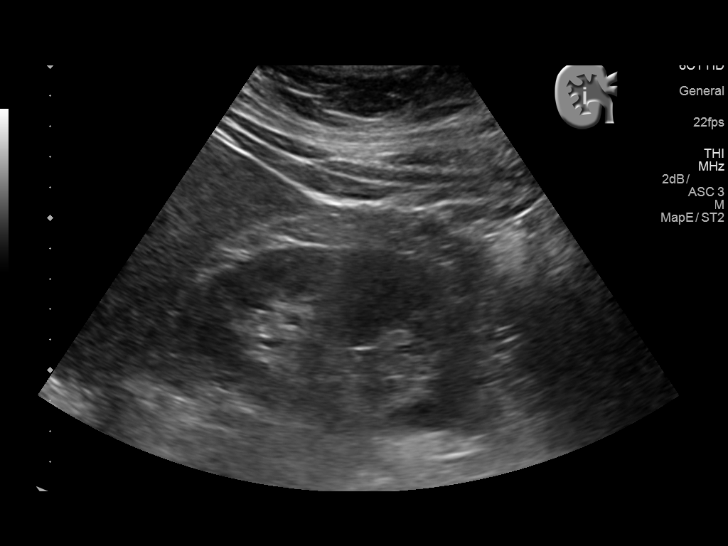
[im 61/81]
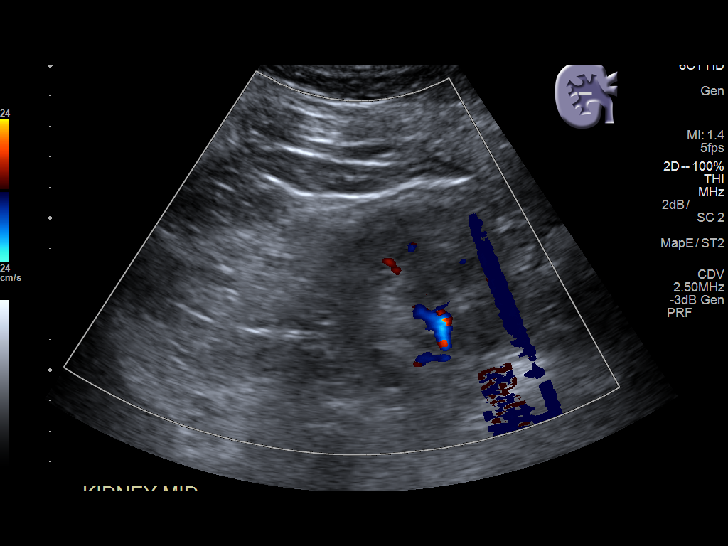
[im 67/81]
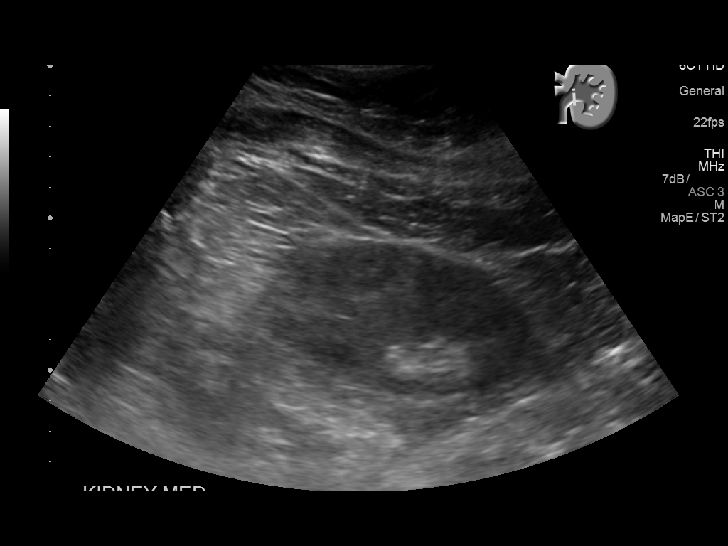
[im 74/81]
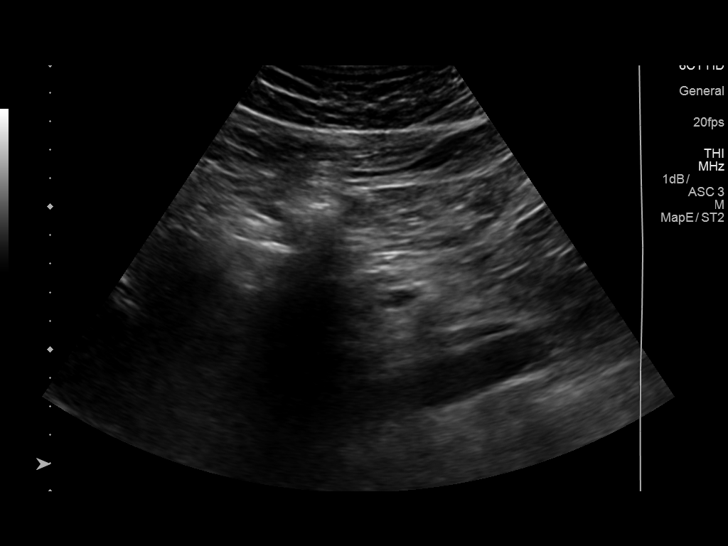
[im 81/81]
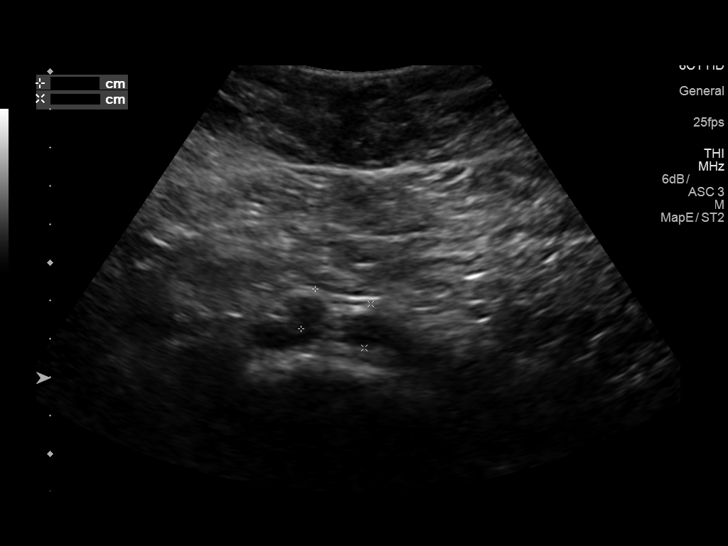

[14 of 25 positions shown; findings below may reference images not displayed]

FINDINGS: Gallbladder: No gallstones or wall thickening visualized. However,
positive sonographic Murphy sign was noted by sonographer.

Common bile duct: Diameter: 1.3 mm which is within normal limits.

Liver: No focal lesion identified. Within normal limits in
parenchymal echogenicity. Portal vein is patent on color Doppler
imaging with normal direction of blood flow towards the liver.

IVC: No abnormality visualized.

Pancreas: Visualized portion unremarkable.

Spleen: Size and appearance within normal limits.

Right Kidney: Length: 10.4 cm. Echogenicity within normal limits. No
mass or hydronephrosis visualized.

Left Kidney: Length: 10.9 cm. Echogenicity within normal limits. No
mass or hydronephrosis visualized.

Abdominal aorta: No aneurysm visualized.

Other findings: None.
IMPRESSION: Positive sonographic Murphy's sign was noted by sonographer, but no
other abnormality is seen in the abdomen. No gallstones or other
sonographic evidence of cholecystitis is noted.

## 2020-10-14 ENCOUNTER — Other Ambulatory Visit: Payer: Self-pay

## 2020-10-14 ENCOUNTER — Ambulatory Visit
Admission: EM | Admit: 2020-10-14 | Discharge: 2020-10-14 | Disposition: A | Discharge status: Home / Self Care | Payer: Federal, State, Local not specified - PPO | Attending: Family Medicine | Admitting: Family Medicine

## 2020-10-14 DIAGNOSIS — B029 Zoster without complications: Secondary | ICD-10-CM

## 2020-10-14 MED ORDER — VALACYCLOVIR HCL 1 G PO TABS: 1000.0000 mg | ORAL_TABLET | Freq: Three times a day (TID) | ORAL | 0 refills | Status: AC

## 2020-10-14 NOTE — ED Triage Notes (Signed)
Patient states that he is here for a rash on right buttock x Thursday.  States that he is also having groin pain/thigh pain on left thigh since the same time.

## 2020-10-15 ENCOUNTER — Encounter: Payer: Self-pay | Admitting: Internal Medicine

## 2020-10-15 ENCOUNTER — Ambulatory Visit (INDEPENDENT_AMBULATORY_CARE_PROVIDER_SITE_OTHER): Payer: Medicare Other | Admitting: Internal Medicine

## 2020-10-15 VITALS — BP 110/78 | HR 85 | Temp 97.7°F | Ht 67.5 in | Wt 217.0 lb

## 2020-10-15 DIAGNOSIS — J45998 Other asthma: Secondary | ICD-10-CM | POA: Insufficient documentation

## 2020-10-15 DIAGNOSIS — A6000 Herpesviral infection of urogenital system, unspecified: Secondary | ICD-10-CM

## 2020-10-15 DIAGNOSIS — R519 Headache, unspecified: Secondary | ICD-10-CM | POA: Insufficient documentation

## 2020-10-15 DIAGNOSIS — F5221 Male erectile disorder: Secondary | ICD-10-CM | POA: Insufficient documentation

## 2020-10-15 DIAGNOSIS — M545 Low back pain, unspecified: Secondary | ICD-10-CM | POA: Insufficient documentation

## 2020-10-15 DIAGNOSIS — K036 Deposits [accretions] on teeth: Secondary | ICD-10-CM | POA: Insufficient documentation

## 2020-10-15 DIAGNOSIS — G43709 Chronic migraine without aura, not intractable, without status migrainosus: Secondary | ICD-10-CM | POA: Insufficient documentation

## 2020-10-15 DIAGNOSIS — Z20822 Contact with and (suspected) exposure to covid-19: Secondary | ICD-10-CM | POA: Insufficient documentation

## 2020-10-15 DIAGNOSIS — Z713 Dietary counseling and surveillance: Secondary | ICD-10-CM | POA: Insufficient documentation

## 2020-10-15 DIAGNOSIS — F39 Unspecified mood [affective] disorder: Secondary | ICD-10-CM | POA: Insufficient documentation

## 2020-10-15 DIAGNOSIS — M79605 Pain in left leg: Secondary | ICD-10-CM | POA: Insufficient documentation

## 2020-10-15 DIAGNOSIS — R739 Hyperglycemia, unspecified: Secondary | ICD-10-CM

## 2020-10-15 DIAGNOSIS — H401390 Pigmentary glaucoma, unspecified eye, stage unspecified: Secondary | ICD-10-CM | POA: Insufficient documentation

## 2020-10-15 DIAGNOSIS — G4733 Obstructive sleep apnea (adult) (pediatric): Secondary | ICD-10-CM | POA: Insufficient documentation

## 2020-10-15 DIAGNOSIS — F419 Anxiety disorder, unspecified: Secondary | ICD-10-CM | POA: Diagnosis not present

## 2020-10-15 DIAGNOSIS — N529 Male erectile dysfunction, unspecified: Secondary | ICD-10-CM | POA: Insufficient documentation

## 2020-10-15 DIAGNOSIS — Z0120 Encounter for dental examination and cleaning without abnormal findings: Secondary | ICD-10-CM | POA: Insufficient documentation

## 2020-10-15 DIAGNOSIS — F1010 Alcohol abuse, uncomplicated: Secondary | ICD-10-CM | POA: Insufficient documentation

## 2020-10-15 DIAGNOSIS — G43001 Migraine without aura, not intractable, with status migrainosus: Secondary | ICD-10-CM | POA: Insufficient documentation

## 2020-10-15 DIAGNOSIS — J309 Allergic rhinitis, unspecified: Secondary | ICD-10-CM | POA: Insufficient documentation

## 2020-10-15 DIAGNOSIS — F338 Other recurrent depressive disorders: Secondary | ICD-10-CM | POA: Insufficient documentation

## 2020-10-15 DIAGNOSIS — M21611 Bunion of right foot: Secondary | ICD-10-CM | POA: Insufficient documentation

## 2020-10-15 DIAGNOSIS — R319 Hematuria, unspecified: Secondary | ICD-10-CM | POA: Insufficient documentation

## 2020-10-15 DIAGNOSIS — J4530 Mild persistent asthma, uncomplicated: Secondary | ICD-10-CM

## 2020-10-15 DIAGNOSIS — F4310 Post-traumatic stress disorder, unspecified: Secondary | ICD-10-CM | POA: Insufficient documentation

## 2020-10-15 DIAGNOSIS — R419 Unspecified symptoms and signs involving cognitive functions and awareness: Secondary | ICD-10-CM | POA: Insufficient documentation

## 2020-10-15 DIAGNOSIS — F4320 Adjustment disorder, unspecified: Secondary | ICD-10-CM | POA: Insufficient documentation

## 2020-10-15 DIAGNOSIS — N419 Inflammatory disease of prostate, unspecified: Secondary | ICD-10-CM | POA: Insufficient documentation

## 2020-10-15 DIAGNOSIS — H02402 Unspecified ptosis of left eyelid: Secondary | ICD-10-CM | POA: Insufficient documentation

## 2020-10-15 DIAGNOSIS — H4032X 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, left eye, stage unspecified: Secondary | ICD-10-CM | POA: Insufficient documentation

## 2020-10-15 DIAGNOSIS — B009 Herpesviral infection, unspecified: Secondary | ICD-10-CM | POA: Insufficient documentation

## 2020-10-15 DIAGNOSIS — G43111 Migraine with aura, intractable, with status migrainosus: Secondary | ICD-10-CM | POA: Insufficient documentation

## 2020-10-15 DIAGNOSIS — Z7189 Other specified counseling: Secondary | ICD-10-CM | POA: Insufficient documentation

## 2020-10-15 DIAGNOSIS — E669 Obesity, unspecified: Secondary | ICD-10-CM | POA: Insufficient documentation

## 2020-10-15 DIAGNOSIS — F339 Major depressive disorder, recurrent, unspecified: Secondary | ICD-10-CM | POA: Insufficient documentation

## 2020-10-15 DIAGNOSIS — U071 COVID-19: Secondary | ICD-10-CM | POA: Insufficient documentation

## 2020-10-15 DIAGNOSIS — H57812 Brow ptosis, left: Secondary | ICD-10-CM | POA: Insufficient documentation

## 2020-10-15 MED ORDER — CLOTRIMAZOLE-BETAMETHASONE 1-0.05 % EX CREA: TOPICAL_CREAM | CUTANEOUS | 1 refills | Status: DC

## 2020-10-15 NOTE — Assessment & Plan Note (Signed)
.  stable overall, cont current med tx - ventolin hfa prn

## 2020-10-15 NOTE — Assessment & Plan Note (Signed)
Rash appears to be episode genital herpes (not shingles as no crusting and rapidly improving); tx is no change however - cont valtrex course, also gave lotrisone topical prn,  to f/u any worsening symptoms or concerns

## 2020-10-15 NOTE — Patient Instructions (Signed)
I suspect you have an episode of genital herpes outbreak (not shingles)  Ok to continue antibiotic treatment you have  OK to also take the lotrisone cream to help ease the inflammation  Please continue all other medications as before, and refills have been done if requested.  Please have the pharmacy call with any other refills you may need.  Please keep your appointments with your specialists as you may have planned

## 2020-10-15 NOTE — Progress Notes (Signed)
Patient ID: Anthony [name], male   DOB: 08/01/1965, 55 y.o.   MRN: [id number]        Chief Complaint: second opinion on buttock rash       HPI:  Anthony [name] is a 55 y.o. male here with above painful rash to right upper buttocks, has hx of genital herpes, ongoing x 5 days, has pics from initial outbreak c/w grouped vesicles on erythema base; seen and tx at UC 2 days ago and dx with shingles, tx with valtrex but pt just not sure of this.  Also has a pain at time to the left groin and proximal medial leg without rash, swelling or difficulty ambulating, and no specific genital rash  Denies urinary symptoms such as dysuria, frequency, urgency, flank pain, hematuria or n/v, fever, chills.  Has hx of right inguinal hernia repair but no swelling.  Does have occasoinal fleeting pains to right groin for years.  Pt denies chest pain, increased sob or doe, wheezing, orthopnea, PND, increased LE swelling, palpitations, dizziness or syncope.   Pt denies polydipsia, polyuria,          Wt Readings from Last 3 Encounters:  10/15/20 217 lb (98.4 kg)  10/14/20 207 lb (93.9 kg)  09/22/20 209 lb (94.8 kg)   BP Readings from Last 3 Encounters:  10/15/20 110/78  10/14/20 126/76  09/22/20 124/89         Past Medical History:  Diagnosis Date   Allergy    Past hx    Anemia    Anxiety    Asthma    Cataract    removed left- forming on the right  DVT and PE from long travel trip    Chronic kidney disease 2006   kidney stones    Clotting disorder ([place]) 2016   PE - small    Complication of anesthesia    HARD TO WAKE UP AFTER 2ND EYE SURGERY   COVID-19 virus infection    Depression    DVT (deep venous thrombosis) ([place])    DUE TO A LONG TRAVEL   Fatty liver    Genital herpes 05/06/2018   H/O renal calculi 2006   Headache    MIGRAINES   History of kidney stones    H/O   History of methicillin resistant staphylococcus aureus (MRSA)    Hyperlipidemia    Internal hemorrhoid    PE (pulmonary embolism)     Peptic ulcer disease    Pneumonia    h/o   PTSD (post-traumatic stress disorder)    Sleep apnea    DOES NOT USE CPAP CURRENTLY-MASK DOES NOT FIT CORRECTLY [facility]13-20)   Spondylosis    Tubular adenoma of colon 2011   Dr Olevia Perches   Past Surgical History:  Procedure Laterality Date   CATARACT EXTRACTION Left 1996   Dr Elonda Husky; OS   COLONOSCOPY     colonoscopy with polypectomy  2011   Dr Olevia Perches, hemorrhoids   CYSTOSCOPY W/ STONE MANIPULATION  2006   [place] Right 12/17/2010   Dr French Ana ; shoulder impingement & torn tendon   TRANSURETHRAL RESECTION OF PROSTATE N/A 02/23/2020   Procedure: TRANSURETHRAL RESECTION OF THE PROSTATE (TURP);  Surgeon: Billey Co, MD;  Location: [facility] ORS;  Service: Urology;  Laterality: N/A;    reports that he has never smoked. He has never  used smokeless tobacco. He reports current alcohol use. He reports that he does not use drugs. family history includes Cancer in his paternal grandmother; Diabetes in his father; Esophageal cancer in his maternal uncle; Glaucoma in his father; Heart attack in his maternal uncle; Heart attack (age of onset: 27) in his mother; Heart attack (age of onset: 2) in his maternal grandmother; Stroke (age of onset: 16) in his father. Allergies  Allergen Reactions   Aleve [Naproxen] Rash    Patient stated it was years ago   Nsaids Other (See Comments)    Bleeding ulcers   Current Outpatient Medications on File Prior to Visit  Medication Sig Dispense Refill   acetaminophen (TYLENOL) 500 MG tablet Take 1,000 mg by mouth every 6 (six) hours as needed for moderate pain.     albuterol (PROVENTIL HFA;VENTOLIN HFA) 108 (90 Base) MCG/ACT inhaler Inhale 2 puffs into the lungs every 6 (six) hours as needed. For shortness of breath 1 Inhaler 1   aspirin EC 81 MG tablet Take 81 mg by mouth daily.     atorvastatin (LIPITOR) 10  MG tablet Take 1 tablet (10 mg total) by mouth daily. 90 tablet 3   Brinzolamide-Brimonidine 1-0.2 % SUSP Place 1 drop into the left eye 2 (two) times daily.      Cholecalciferol (VITAMIN D3 PO) Take 1 tablet by mouth daily.     fluticasone (FLONASE) 50 MCG/ACT nasal spray Place 2 sprays into both nostrils daily as needed for allergies. 16 g 3   fluticasone (FLONASE) 50 MCG/ACT nasal spray Place into the nose.     ketoconazole (NIZORAL) 2 % cream Apply topically.     Multiple Vitamin (MULTIVITAMIN) tablet Take 1 tablet by mouth daily.     OXcarbazepine (TRILEPTAL) 150 MG tablet Take 150 mg by mouth as needed (FOR ANXIETY/PTSD).     PARoxetine (PAXIL) 40 MG tablet TAKE ONE-HALF TABLET BY MOUTH DAILY FOR MENTAL HEALTH     sildenafil (VIAGRA) 100 MG tablet TAKE ONE TABLET BY MOUTH AS INSTRUCTED (TAKE 1 HOUR PRIOR TO SEXUAL ACTIVITY *DO NOT EXCEED 1 DOSE PER 24 HOUR PERIOD*)     SYMBICORT 160-4.5 MCG/ACT inhaler Inhale 2 puffs into the lungs in the morning and at bedtime.     tiZANidine (ZANAFLEX) 4 MG tablet      triamcinolone cream (KENALOG) 0.1 % Apply topically.     valACYclovir (VALTREX) 1000 MG tablet TAKE 1/2 TABLET BY MOUTH TWICE DAILY FOR 3 DAYS OR 1 TABLET BY MOUTH EVERY DAY FOR 5 DAYS AT ONSET OF BREAKOUT 90 tablet 1   valACYclovir (VALTREX) 1000 MG tablet Take 1 tablet (1,000 mg total) by mouth 3 (three) times daily for 7 days. 21 tablet 0   [DISCONTINUED] omeprazole (PRILOSEC) 20 MG capsule Take 1 capsule (20 mg total) by mouth daily. 30 capsule 0   [DISCONTINUED] topiramate (TOPAMAX) 25 MG tablet topiramate 25 mg tablet (Patient not taking: No sig reported)     No current facility-administered medications on file prior to visit.        ROS:  All others reviewed and negative.  Objective        PE:  BP 110/78 (BP Location: Left Arm, Patient Position: Sitting, Cuff Size: Large)   Pulse 85   Temp 97.7 F (36.5 C) (Oral)   Ht 5' 7.5" (1.715 m)   Wt 217 lb (98.4 kg)   SpO2 95%    BMI 33.49 kg/m  Constitutional: Pt appears in NAD               HENT: Head: NCAT.                Right Ear: External ear normal.                 Left Ear: External ear normal.                Eyes: . Pupils are equal, round, and reactive to light. Conjunctivae and EOM are normal               Nose: without d/c or deformity               Neck: Neck supple. Gross normal ROM               Cardiovascular: Normal rate and regular rhythm.                 Pulmonary/Chest: Effort normal and breath sounds without rales or wheezing.                Abd:  Soft, NT, ND, + BS, no organomegaly; no groin sweling or tenderness               Neurological: Pt is alert. At baseline orientation, motor grossly intact               Skin: LE edema - none; right upper buttock at natal cleft with residual appearing indurated areas but vesicles appear to have already resolved               Psychiatric: Pt behavior is normal without agitation   Micro: none  Cardiac tracings I have personally interpreted today:  none  Pertinent Radiological findings (summarize): none   Lab Results  Component Value Date   WBC 4.7 06/25/2020   HGB 14.7 06/25/2020   HCT 43.7 06/25/2020   PLT 250.0 06/25/2020   GLUCOSE 92 06/25/2020   CHOL 199 06/25/2020   TRIG 101.0 06/25/2020   HDL 51.50 06/25/2020   LDLDIRECT 89.0 04/20/2019   LDLCALC 127 (H) 06/25/2020   ALT 20 06/25/2020   AST 16 06/25/2020   NA 137 06/25/2020   K 4.3 06/25/2020   CL 103 06/25/2020   CREATININE 0.95 06/25/2020   BUN 10 06/25/2020   CO2 27 06/25/2020   TSH 0.70 06/25/2020   PSA 0.66 06/25/2020   HGBA1C 5.9 06/25/2020   Assessment/Plan:  DARYUS SOWASH is a 55 y.o. Black or African American [2] male with  has a past medical history of Allergy, Anemia, Anxiety, Asthma, Cataract, Chronic kidney disease (2006), Clotting disorder ([place]) (1610), Complication of anesthesia, COVID-19 virus infection, Depression, DVT (deep venous thrombosis)  ([place]), Fatty liver, Genital herpes (05/06/2018), H/O renal calculi (2006), Headache, History of kidney stones, History of methicillin resistant staphylococcus aureus (MRSA), Hyperlipidemia, Internal hemorrhoid, PE (pulmonary embolism), Peptic ulcer disease, Pneumonia, PTSD (post-traumatic stress disorder), Sleep apnea, Spondylosis, and Tubular adenoma of colon (2011).  Genital herpes Rash appears to be episode genital herpes (not shingles as no crusting and rapidly improving); tx is no change however - cont valtrex course, also gave lotrisone topical prn,  to f/u any worsening symptoms or concerns  Hyperglycemia Lab Results  Component Value Date   HGBA1C 5.9 06/25/2020   Stable, pt to continue current medical treatment * - diet   Anxiety situatinoally worse, pt reassured, no need for change in specific tx at this time  Asthma, mild persistent .stable overall, cont current med tx - ventolin hfa prn  Followup: Return if symptoms worsen or fail to improve.  Cathlean Cower, MD 10/15/2020 8:58 PM [facility]

## 2020-10-15 NOTE — Assessment & Plan Note (Signed)
Lab Results  Component Value Date   HGBA1C 5.9 06/25/2020   Stable, pt to continue current medical treatment * - diet

## 2020-10-15 NOTE — Assessment & Plan Note (Signed)
situatinoally worse, pt reassured, no need for change in specific tx at this time

## 2020-10-16 NOTE — ED Provider Notes (Signed)
[facility]    CSN: [id number] Arrival date & time: 10/14/20  1028      History   Chief Complaint Chief Complaint  Patient presents with   Rash   Groin Pain    left   HPI  55 year old male presents with the above complaints.  Patient reports that he has had a discrete area of rash on his right buttock since Thursday.  Patient states he has also had an odd sensation in his left groin.  He is unsure if these are related.  His discomfort is 3/10 in severity.  No relieving factors.  No known inciting factor regarding his rash.  No fever.  No medications or interventions tried.  No other complaints.  Past Medical History:  Diagnosis Date   Allergy    Past hx    Anemia    Anxiety    Asthma    Cataract    removed left- forming on the right  DVT and PE from long travel trip    Chronic kidney disease 2006   kidney stones    Clotting disorder ([place]) 2016   PE - small    Complication of anesthesia    HARD TO WAKE UP AFTER 2ND EYE SURGERY   COVID-19 virus infection    Depression    DVT (deep venous thrombosis) (HCC)    DUE TO A LONG TRAVEL   Fatty liver    Genital herpes 05/06/2018   H/O renal calculi 2006   Headache    MIGRAINES   History of kidney stones    H/O   History of methicillin resistant staphylococcus aureus (MRSA)    Hyperlipidemia    Internal hemorrhoid    PE (pulmonary embolism)    Peptic ulcer disease    Pneumonia    h/o   PTSD (post-traumatic stress disorder)    Sleep apnea    DOES NOT USE CPAP CURRENTLY-MASK DOES NOT [facility] CORRECTLY [facility]13-20)   Spondylosis    Tubular adenoma of colon 2011   Dr Olevia Perches    Patient Active Problem List   Diagnosis Date Noted   Allergic rhinitis 10/15/2020   Brow ptosis, left 10/15/2020   Bunion of right foot 10/15/2020   Contact with and (suspected) exposure to covid-19 10/15/2020   COVID-19 10/15/2020   Deposits (accretions) on teeth 10/15/2020   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, left eye, stage unspecified 10/15/2020   Hematuria, unspecified 10/15/2020   Inflammatory disease of prostate, unspecified 10/15/2020   Male erectile disorder (CODE) 10/15/2020   Male erectile dysfunction, unspecified 10/15/2020   Nondependent alcohol abuse, continuous drinking behavior 10/15/2020   Obesity 10/15/2020   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   Unspecified ptosis of left eyelid 10/15/2020   Unspecified symptoms and signs involving cognitive functions and awareness 10/15/2020   Other recurrent depressive disorders ([place]) 10/15/2020   Post-traumatic stress disorder, unspecified 10/15/2020   Adjustment disorder, unspecified 10/15/2020   Encounter for dental examination and cleaning without abnormal findings 10/15/2020   Recurrent major depression ([place]) 10/15/2020   Major depressive disorder, recurrent, unspecified ([place]) 10/15/2020   Unspecified mood (affective) disorder ([place]) 10/15/2020   Dietary counseling and surveillance 10/15/2020   Other specified counseling 10/15/2020   Headache, unspecified 10/15/2020   Herpes simplex 10/15/2020   Migraine with aura, intractable, with status migrainosus 10/15/2020   Chronic migraine without aura, not intractable, without status migrainosus 10/15/2020   Low back pain, unspecified 10/15/2020   Pain in left  leg 10/15/2020   Obstructive sleep apnea (adult) (pediatric) 10/15/2020   Other asthma 10/15/2020   Meralgia paresthetica of left side 07/03/2020   Major depressive disorder, single episode, unspecified 06/25/2020   Leg cramping 09/04/2019   Pain in joint of right elbow 08/03/2019   Lateral epicondylitis of right elbow 08/03/2019   Nephrolithiasis 05/11/2019   Epigastric pain 05/11/2019   Other constipation 05/11/2019   Right sided abdominal pain 05/11/2019   Paresthesia 04/20/2019   Chronic post-traumatic stress disorder 01/14/2019   Left ear hearing loss 01/14/2019   Low back pain 01/14/2019   Intractable migraine with  aura without status migrainosus 01/07/2019   Genital herpes 05/06/2018   STD exposure 05/06/2018   Obstructive sleep apnea 02/13/2018   Hyperglycemia 01/24/2018   Pelvic pain 07/10/2017   Exposure to blood or body fluid 07/07/2017   Trigger thumb of right hand 05/21/2017   Trigger finger, right middle finger 03/18/2017   Anxiety 03/18/2017   Chronic tension-type headache, not intractable 02/12/2017   Herpes simplex type II infection 12/02/2016   Dysuria 12/02/2016   Pain of right thumb 11/13/2016   Other chest pain 07/27/2016   Onychomycosis 06/12/2016   Encounter for well adult exam with abnormal findings 06/12/2016   Generalized headache 05/15/2016   Lumbar paraspinal muscle spasm 05/15/2016   History of DVT (deep vein thrombosis) 11/18/2015   Elevated PSA 11/18/2015   Acute pulmonary embolism ([place]) 2016 05/09/2015   Peptic ulcer 05/09/2015   Dyspnea 05/02/2015   DVT (deep venous thrombosis) ([place]) 04/26/2015   Duodenal ulcer 04/25/2015   Positive D dimer 04/23/2015   Internal hemorrhoid, bleeding 04/04/2015   Acute gastrointestinal hemorrhage 04/03/2015   Duodenitis 04/03/2015   Postural dizziness with presyncope 04/03/2015   Microscopic hematuria 04/03/2015   Dislocated IOL (intraocular lens), initial encounter 12/10/2014   MRSA infection 09/14/2013   History of methicillin resistant Staphylococcus aureus infection 02/17/2013   Spondylosis of lumbar joint 09/28/2012   Fatty liver disease, nonalcoholic 91/47/8295   Lumbosacral spondylosis without myelopathy 09/28/2012   Fatty metamorphosis of liver 09/28/2012   Atopic dermatitis, unspecified 05/13/2012   Seasonal and perennial allergic rhinitis 10/01/2011   Family history of ischemic heart disease 04/02/2011   Low serum testosterone level 03/12/2011   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/12/2011   COLONIC POLYPS, HX OF 06/21/2009   HLD (hyperlipidemia) 02/16/2008   Asthma, mild persistent 02/16/2008   NONSPECIFIC ABNORMAL  ELECTROCARDIOGRAM 02/16/2008   NEPHROLITHIASIS, HX OF 02/16/2008    Past Surgical History:  Procedure Laterality Date   CATARACT EXTRACTION Left 1996   Dr Elonda Husky; OS   COLONOSCOPY     colonoscopy with polypectomy  2011   Dr Olevia Perches, hemorrhoids   CYSTOSCOPY W/ STONE MANIPULATION  2006   EYE SURGERY Left    INGUINAL HERNIA REPAIR Right 1990   POLYPECTOMY     SHOULDER SURGERY Right 12/17/2010   Dr French Ana ; shoulder impingement & torn tendon   TRANSURETHRAL RESECTION OF PROSTATE N/A 02/23/2020   Procedure: TRANSURETHRAL RESECTION OF THE PROSTATE (TURP);  Surgeon: Billey Co, MD;  Location: [facility];  Service: Urology;  Laterality: N/A;       Home Medications    Prior to Admission medications   Medication Sig Start Date End Date Taking? Authorizing Provider  acetaminophen (TYLENOL) 500 MG tablet Take 1,000 mg by mouth every 6 (six) hours as needed for moderate pain.   Yes [provider]  albuterol (PROVENTIL HFA;VENTOLIN HFA) 108 (90 Base) MCG/ACT inhaler Inhale 2 puffs into the  lungs every 6 (six) hours as needed. For shortness of breath 06/29/16  Yes [place], Betty G, MD  aspirin EC 81 MG tablet Take 81 mg by mouth daily.   Yes [provider]  atorvastatin (LIPITOR) 10 MG tablet Take 1 tablet (10 mg total) by mouth daily. 06/25/20 06/25/21 Yes Biagio Borg, MD  Brinzolamide-Brimonidine 1-0.2 % SUSP Place 1 drop into the left eye 2 (two) times daily.    Yes [provider]  Cholecalciferol (VITAMIN D3 PO) Take 1 tablet by mouth daily.   Yes [provider]  fluticasone (FLONASE) 50 MCG/ACT nasal spray Place 2 sprays into both nostrils daily as needed for allergies. 04/29/18  Yes Hoyt Koch, MD  Multiple Vitamin (MULTIVITAMIN) tablet Take 1 tablet by mouth daily.   Yes [provider]  OXcarbazepine (TRILEPTAL) 150 MG tablet Take 150 mg by mouth as needed (FOR ANXIETY/PTSD).   Yes [provider]  PARoxetine (PAXIL)  40 MG tablet TAKE ONE-HALF TABLET BY MOUTH DAILY FOR MENTAL HEALTH 01/01/20  Yes [provider]  sildenafil (VIAGRA) 100 MG tablet TAKE ONE TABLET BY MOUTH AS INSTRUCTED (TAKE 1 HOUR PRIOR TO SEXUAL ACTIVITY *DO NOT EXCEED 1 DOSE PER 24 HOUR PERIOD*) 07/12/20  Yes [provider]  SYMBICORT 160-4.5 MCG/ACT inhaler Inhale 2 puffs into the lungs in the morning and at bedtime. 10/26/14  Yes [provider]  tiZANidine (ZANAFLEX) 4 MG tablet  04/14/18  Yes [provider]  valACYclovir (VALTREX) 1000 MG tablet TAKE 1/2 TABLET BY MOUTH TWICE DAILY FOR 3 DAYS OR 1 TABLET BY MOUTH EVERY DAY FOR 5 DAYS AT ONSET OF BREAKOUT 12/06/19  Yes Biagio Borg, MD  valACYclovir (VALTREX) 1000 MG tablet Take 1 tablet (1,000 mg total) by mouth 3 (three) times daily for 7 days. 10/14/20 10/21/20 Yes Coral Spikes, DO  clotrimazole-betamethasone (LOTRISONE) cream Use as directed twice per day as needed to the affected area 10/15/20   Biagio Borg, MD  fluticasone [facility]) 50 MCG/ACT nasal spray Place into the nose. 07/12/20   [provider]  ketoconazole (NIZORAL) 2 % cream Apply topically. 07/12/20   [provider]  triamcinolone cream (KENALOG) 0.1 % Apply topically. 09/06/20   [provider]  omeprazole (PRILOSEC) 20 MG capsule Take 1 capsule (20 mg total) by mouth daily. 04/03/20 04/13/20  Faustino Congress, NP  topiramate (TOPAMAX) 25 MG tablet topiramate 25 mg tablet Patient not taking: No sig reported  04/13/20  [provider]    Family History Family History  Problem Relation Age of Onset   Heart attack Mother 6   Stroke Father 52   Diabetes Father        PVD   Glaucoma Father        blindness   Cancer Paternal Grandmother        ? primary   Heart attack Maternal Uncle         X2,pre 55   Esophageal cancer Maternal Uncle    Heart attack Maternal Grandmother 73   Asthma Neg Hx    COPD Neg Hx    Colon cancer Neg Hx    Colon polyps  Neg Hx    Stomach cancer Neg Hx    Rectal cancer Neg Hx     Social History Social History   Tobacco Use   Smoking status: Never   Smokeless tobacco: Never  Vaping Use   Vaping Use: Never used  Substance Use Topics   Alcohol  use: Yes    Alcohol/week: 0.0 standard drinks    Comment: Socially , < 2X/ month   Drug use: No     Allergies   Aleve [naproxen] and Nsaids   Review of Systems Review of Systems Per HPI  Physical Exam Triage Vital Signs ED Triage Vitals  Enc Vitals Group     BP 10/14/20 1115 126/76     Pulse Rate 10/14/20 1115 89     Resp 10/14/20 1115 18     Temp 10/14/20 1115 97.9 F (36.6 C)     Temp Source 10/14/20 1115 Oral     SpO2 10/14/20 1115 96 %     Weight 10/14/20 1114 207 lb (93.9 kg)     Height 10/14/20 1114 5' 7.5" (1.715 m)     Head Circumference --      Peak Flow --      Pain Score 10/14/20 1114 3     Pain Loc --      Pain Edu? --      Excl. in [place]? --    Updated Vital Signs BP 126/76 (BP Location: Right Arm)   Pulse 89   Temp 97.9 F (36.6 C) (Oral)   Resp 18   Ht 5' 7.5" (1.715 m)   Wt 93.9 kg   SpO2 96%   BMI 31.94 kg/m   Visual Acuity Right Eye Distance:   Left Eye Distance:   Bilateral Distance:    Right Eye Near:   Left Eye Near:    Bilateral Near:     Physical Exam Vitals and nursing note reviewed.  Constitutional:      General: He is not in acute distress.    Appearance: Normal appearance. He is not ill-appearing.  HENT:     Head: Normocephalic and atraumatic.  Eyes:     General:        Right eye: No discharge.        Left eye: No discharge.     Conjunctiva/sclera: Conjunctivae normal.  Pulmonary:     Effort: Pulmonary effort is normal. No respiratory distress.  Genitourinary:    Comments: No appreciable abnormalities of the right groin.  No evidence of hernia. Skin:    Comments: Right buttock with a small area of vesicular rash.  Neurological:     Mental Status: He is alert.  Psychiatric:         Mood and Affect: Mood normal.        Behavior: Behavior normal.     UC Treatments / Results  Labs (all labs ordered are listed, but only abnormal results are displayed) Labs Reviewed - No data to display  EKG   Radiology No results found.  Procedures Procedures (including critical care time)  Medications Ordered in UC Medications - No data to display  Initial Impression / Assessment and Plan / UC Course  I have reviewed the triage vital signs and the nursing notes.  Pertinent labs & imaging results that were available during my care of the patient were reviewed by me and considered in my medical decision making (see chart for details).    55 year old male presents with a rash consistent with herpes zoster.  Treating with Valtrex.  Final Clinical Impressions(s) / UC Diagnoses   Final diagnoses:  Herpes zoster without complication   Discharge Instructions   None    ED Prescriptions     Medication Sig Dispense Auth. Provider   valACYclovir (VALTREX) 1000 MG tablet Take 1 tablet (1,000 mg total) by  mouth 3 (three) times daily for 7 days. 21 tablet Thersa Salt G, DO      PDMP not reviewed this encounter.   Thersa Salt [place], [place] 10/16/20 [phone]

## 2020-11-13 NOTE — Telephone Encounter (Signed)
Received the following message from patient:   " Do I still need to get a booster. I had Covid in January. You told me to wait till July"  I looked at his AVS from January 2022 and found that he was told to wait 4-6 weeks to get a booster.   Dr. Annamaria Boots, can you please advise if it was 4-6 weeks or wait until July? Thanks!

## 2020-11-14 NOTE — Telephone Encounter (Signed)
No way to be certain. Getting a booster now will add a little protection against severe infection. Whether he gets one now or not, there will be updated vaccines with much better coverage against these newer variants, probably available by November.  I would plan on getting one of those and a flu shot this Fall.

## 2020-11-19 ENCOUNTER — Other Ambulatory Visit: Payer: Self-pay

## 2020-11-19 ENCOUNTER — Ambulatory Visit (INDEPENDENT_AMBULATORY_CARE_PROVIDER_SITE_OTHER): Payer: Medicare Other | Admitting: Internal Medicine

## 2020-11-19 DIAGNOSIS — R739 Hyperglycemia, unspecified: Secondary | ICD-10-CM | POA: Diagnosis not present

## 2020-11-19 DIAGNOSIS — L03114 Cellulitis of left upper limb: Secondary | ICD-10-CM

## 2020-11-19 DIAGNOSIS — J4530 Mild persistent asthma, uncomplicated: Secondary | ICD-10-CM

## 2020-11-19 MED ORDER — SULFAMETHOXAZOLE-TRIMETHOPRIM 800-160 MG PO TABS: 1.0000 | ORAL_TABLET | Freq: Two times a day (BID) | ORAL | 0 refills | Status: DC

## 2020-11-19 NOTE — Assessment & Plan Note (Signed)
Exam more c/w cellulitis, though cant ro bursitis as well; for septra ds bid course, consider f/u sport med if not improved

## 2020-11-19 NOTE — Patient Instructions (Signed)
Please take all new medication as prescribed - the antibiotic  Please see Sports Medicine on the first floor if not improved in 3--5 days  Please continue all other medications as before, and refills have been done if requested.  Please have the pharmacy call with any other refills you may need.  Please continue your efforts at being more active, low cholesterol diet, and weight control.  Please keep your appointments with your specialists as you may have planned

## 2020-11-19 NOTE — Progress Notes (Signed)
Patient ID: Anthony [name], male   DOB: 1966-01-26, 55 y.o.   MRN: [id number]        Chief Complaint: follow up right elbow pain       HPI:  Anthony [name] is a 55 y.o. male here with c/o 3 days onset right elbow pain, swelling without fever, chills, trauma or hx of gout; pain is constant, 8/10 at times, worse to touch or put on arm of chair, nothing else really makes better or worse.  Pt denies chest pain, increased sob or doe, wheezing, orthopnea, PND, increased LE swelling, palpitations, dizziness or syncope.   Pt denies polydipsia, polyuria, or new focal neuro s/s.   Pt denies fever, wt loss, night sweats, loss of appetite, or other constitutional symptoms  Pt has covid infection jan 2022.          Wt Readings from Last 3 Encounters:  11/19/20 210 lb 9.6 oz (95.5 kg)  10/15/20 217 lb (98.4 kg)  10/14/20 207 lb (93.9 kg)   BP Readings from Last 3 Encounters:  11/19/20 100/72  10/15/20 110/78  10/14/20 126/76         Past Medical History:  Diagnosis Date   Allergy    Past hx    Anemia    Anxiety    Asthma    Cataract    removed left- forming on the right  DVT and PE from long travel trip    Chronic kidney disease 2006   kidney stones    Clotting disorder ([place]) 2016   PE - small    Complication of anesthesia    HARD TO WAKE UP AFTER 2ND EYE SURGERY   COVID-19 virus infection    Depression    DVT (deep venous thrombosis) ([place])    DUE TO A LONG TRAVEL   Fatty liver    Genital herpes 05/06/2018   H/O renal calculi 2006   Headache    MIGRAINES   History of kidney stones    H/O   History of methicillin resistant staphylococcus aureus (MRSA)    Hyperlipidemia    Internal hemorrhoid    PE (pulmonary embolism)    Peptic ulcer disease    Pneumonia    h/o   PTSD (post-traumatic stress disorder)    Sleep apnea    DOES NOT USE CPAP CURRENTLY-MASK DOES NOT FIT CORRECTLY [facility]13-20)   Spondylosis    Tubular adenoma of colon 2011   Dr Olevia Perches    Past Surgical History:  Procedure Laterality Date   CATARACT EXTRACTION Left 1996   Dr Elonda Husky; OS   COLONOSCOPY     colonoscopy with polypectomy  2011   Dr Olevia Perches, hemorrhoids   CYSTOSCOPY W/ STONE MANIPULATION  2006   [place] Right 12/17/2010   Dr French Ana ; shoulder impingement & torn tendon   TRANSURETHRAL RESECTION OF PROSTATE N/A 02/23/2020   Procedure: TRANSURETHRAL RESECTION OF THE PROSTATE (TURP);  Surgeon: Billey Co, MD;  Location: [facility];  Service: Urology;  Laterality: N/A;    reports that he has never smoked. He has never used smokeless tobacco. He reports current alcohol use. He reports that he does not use drugs. family history includes Cancer in his paternal grandmother; Diabetes in his father; Esophageal cancer in his maternal uncle; Glaucoma in his father; Heart attack in his maternal uncle; Heart  attack (age of onset: 26) in his mother; Heart attack (age of onset: 59) in his maternal grandmother; Stroke (age of onset: 5) in his father. Allergies  Allergen Reactions   Aleve [Naproxen] Rash    Patient stated it was years ago   Nsaids Other (See Comments)    Bleeding ulcers   Current Outpatient Medications on File Prior to Visit  Medication Sig Dispense Refill   acetaminophen (TYLENOL) 500 MG tablet Take 1,000 mg by mouth every 6 (six) hours as needed for moderate pain.     albuterol (PROVENTIL HFA;VENTOLIN HFA) 108 (90 Base) MCG/ACT inhaler Inhale 2 puffs into the lungs every 6 (six) hours as needed. For shortness of breath 1 Inhaler 1   aspirin EC 81 MG tablet Take 81 mg by mouth daily.     atorvastatin (LIPITOR) 10 MG tablet Take 1 tablet (10 mg total) by mouth daily. 90 tablet 3   Brinzolamide-Brimonidine 1-0.2 % SUSP Place 1 drop into the left eye 2 (two) times daily.      carboxymethylcellulose (REFRESH PLUS) 0.5 % SOLN Apply to eye.     Cholecalciferol (VITAMIN D3 PO)  Take 1 tablet by mouth daily.     clotrimazole-betamethasone (LOTRISONE) cream Use as directed twice per day as needed to the affected area 15 g 1   fluticasone (FLONASE) 50 MCG/ACT nasal spray Place 2 sprays into both nostrils daily as needed for allergies. 16 g 3   fluticasone (FLONASE) 50 MCG/ACT nasal spray Place into the nose.     ketoconazole (NIZORAL) 2 % cream Apply topically.     ketotifen (ZADITOR) 0.025 % ophthalmic solution Apply to eye.     Multiple Vitamin (MULTIVITAMIN) tablet Take 1 tablet by mouth daily.     OXcarbazepine (TRILEPTAL) 150 MG tablet Take 150 mg by mouth as needed (FOR ANXIETY/PTSD).     PARoxetine (PAXIL) 40 MG tablet TAKE ONE-HALF TABLET BY MOUTH DAILY FOR MENTAL HEALTH     sildenafil (VIAGRA) 100 MG tablet TAKE ONE TABLET BY MOUTH AS INSTRUCTED (TAKE 1 HOUR PRIOR TO SEXUAL ACTIVITY *DO NOT EXCEED 1 DOSE PER 24 HOUR PERIOD*)     SYMBICORT 160-4.5 MCG/ACT inhaler Inhale 2 puffs into the lungs in the morning and at bedtime.     tiZANidine (ZANAFLEX) 4 MG tablet      triamcinolone cream (KENALOG) 0.1 % Apply topically.     valACYclovir (VALTREX) 1000 MG tablet TAKE 1/2 TABLET BY MOUTH TWICE DAILY FOR 3 DAYS OR 1 TABLET BY MOUTH EVERY DAY FOR 5 DAYS AT ONSET OF BREAKOUT 90 tablet 1   [DISCONTINUED] omeprazole (PRILOSEC) 20 MG capsule Take 1 capsule (20 mg total) by mouth daily. 30 capsule 0   [DISCONTINUED] topiramate (TOPAMAX) 25 MG tablet topiramate 25 mg tablet (Patient not taking: No sig reported)     No current facility-administered medications on file prior to visit.        ROS:  All others reviewed and negative.  Objective        PE:  BP 100/72 (BP Location: Left Arm, Patient Position: Sitting, Cuff Size: Normal)   Pulse (!) 102   Temp 98.5 F (36.9 C) (Oral)   Ht 5' 7.5" (1.715 m)   Wt 210 lb 9.6 oz (95.5 kg)   SpO2 96%   BMI 32.50 kg/m                 Constitutional: Pt appears in NAD  HENT: Head: NCAT.                Right Ear:  External ear normal.                 Left Ear: External ear normal.                Eyes: . Pupils are equal, round, and reactive to light. Conjunctivae and EOM are normal               Nose: without d/c or deformity               Neck: Neck supple. Gross normal ROM               Cardiovascular: Normal rate and regular rhythm.                 Pulmonary/Chest: Effort normal and breath sounds without rales or wheezing.                Abd:  Soft, NT, ND, + BS, no organomegaly               Neurological: Pt is alert. At baseline orientation, motor grossly intact               Skin:  LE edema - none; right elbow with 4-5 cm oval area involving the olecranon and post arm below the elbow with tender, warm, erythema without ulcer or red streaks or right axillary LA               Psychiatric: Pt behavior is normal without agitation   Micro: none  Cardiac tracings I have personally interpreted today:  none  Pertinent Radiological findings (summarize): none   Lab Results  Component Value Date   WBC 4.7 06/25/2020   HGB 14.7 06/25/2020   HCT 43.7 06/25/2020   PLT 250.0 06/25/2020   GLUCOSE 92 06/25/2020   CHOL 199 06/25/2020   TRIG 101.0 06/25/2020   HDL 51.50 06/25/2020   LDLDIRECT 89.0 04/20/2019   LDLCALC 127 (H) 06/25/2020   ALT 20 06/25/2020   AST 16 06/25/2020   NA 137 06/25/2020   K 4.3 06/25/2020   CL 103 06/25/2020   CREATININE 0.95 06/25/2020   BUN 10 06/25/2020   CO2 27 06/25/2020   TSH 0.70 06/25/2020   PSA 0.66 06/25/2020   HGBA1C 5.9 06/25/2020   Assessment/Plan:  UZIEL COVAULT is a 55 y.o. Black or African American [2] male with  has a past medical history of Allergy, Anemia, Anxiety, Asthma, Cataract, Chronic kidney disease (2006), Clotting disorder ([place]) (9485), Complication of anesthesia, COVID-19 virus infection, Depression, DVT (deep venous thrombosis) ([place]), Fatty liver, Genital herpes (05/06/2018), H/O renal calculi (2006), Headache, History of kidney stones,  History of methicillin resistant staphylococcus aureus (MRSA), Hyperlipidemia, Internal hemorrhoid, PE (pulmonary embolism), Peptic ulcer disease, Pneumonia, PTSD (post-traumatic stress disorder), Sleep apnea, Spondylosis, and Tubular adenoma of colon (2011).  Cellulitis of left elbow Exam more c/w cellulitis, though cant ro bursitis as well; for septra ds bid course, consider f/u sport med if not improved  Hyperglycemia Lab Results  Component Value Date   HGBA1C 5.9 06/25/2020   Stable, pt to continue current medical treatment  - diet   Asthma, mild persistent Stable overall, cont albuterol hfa prn  Followup: Return if symptoms worsen or fail to improve.  Cathlean Cower, MD 11/21/2020 10:31 PM [facility]

## 2020-11-21 ENCOUNTER — Encounter: Payer: Self-pay | Admitting: Internal Medicine

## 2020-11-21 NOTE — Assessment & Plan Note (Signed)
Lab Results  Component Value Date   HGBA1C 5.9 06/25/2020   Stable, pt to continue current medical treatment  - diet

## 2020-11-21 NOTE — Assessment & Plan Note (Signed)
Stable overall, cont albuterol hfa prn

## 2020-12-27 ENCOUNTER — Ambulatory Visit (INDEPENDENT_AMBULATORY_CARE_PROVIDER_SITE_OTHER): Payer: Medicare Other | Admitting: Internal Medicine

## 2020-12-27 ENCOUNTER — Encounter: Payer: Self-pay | Admitting: Internal Medicine

## 2020-12-27 ENCOUNTER — Other Ambulatory Visit: Payer: Self-pay

## 2020-12-27 VITALS — BP 116/70 | HR 81 | Temp 98.2°F | Ht 67.5 in | Wt 211.0 lb

## 2020-12-27 DIAGNOSIS — R739 Hyperglycemia, unspecified: Secondary | ICD-10-CM | POA: Diagnosis not present

## 2020-12-27 DIAGNOSIS — F419 Anxiety disorder, unspecified: Secondary | ICD-10-CM | POA: Diagnosis not present

## 2020-12-27 DIAGNOSIS — R21 Rash and other nonspecific skin eruption: Secondary | ICD-10-CM | POA: Diagnosis not present

## 2020-12-27 DIAGNOSIS — L03114 Cellulitis of left upper limb: Secondary | ICD-10-CM | POA: Diagnosis not present

## 2020-12-27 DIAGNOSIS — L209 Atopic dermatitis, unspecified: Secondary | ICD-10-CM | POA: Diagnosis not present

## 2020-12-27 MED ORDER — CLOTRIMAZOLE-BETAMETHASONE 1-0.05 % EX CREA: TOPICAL_CREAM | CUTANEOUS | 1 refills | Status: DC

## 2020-12-27 MED ORDER — METHYLPREDNISOLONE ACETATE 80 MG/ML IJ SUSP
80.0000 mg | Freq: Once | INTRAMUSCULAR | Status: AC
  Administered 2020-12-27: 80 mg via INTRAMUSCULAR

## 2020-12-27 NOTE — Patient Instructions (Addendum)
You had the steroid shot today  Please take all new medication as prescribed - the lotrisone cream for the rash  Please continue all other medications as before, and refills have been done if requested.  Please have the pharmacy call with any other refills you may need.  Please continue your efforts at being more active, low cholesterol diet, and weight control.  Please keep your appointments with your specialists as you may have planned  Please make an Appointment to return in 5 months (after Jan 1) or sooner if needed

## 2020-12-29 ENCOUNTER — Encounter: Payer: Self-pay | Admitting: Internal Medicine

## 2020-12-29 NOTE — Assessment & Plan Note (Signed)
With flare, for depomedrol im 80, and refill lotrisone prn,  to f/u any worsening symptoms or concerns

## 2020-12-29 NOTE — Assessment & Plan Note (Signed)
Lab Results  Component Value Date   HGBA1C 5.9 06/25/2020   Stable, pt to continue current medical treatment  - diet

## 2020-12-29 NOTE — Assessment & Plan Note (Signed)
Resolved, ok to follow for any recurrence

## 2020-12-29 NOTE — Progress Notes (Signed)
Patient ID: Anthony [name], male   DOB: [birth year], 55 y.o.   MRN: [id number]        Chief Complaint: follow up rash, hyperglycemia, elbow cellulitis       HPI:  Anthony [name] is a 55 y.o. male here overall doing ok but has worsening rash itchy to the right lateral neck rather large area 3x3 cm, scaly somewhat, worse to scratch at it, better with calamine but not resolving.  Right elbow red, tender, swelling now completely resolved.  No fever, chills and Pt denies chest pain, increased sob or doe, wheezing, orthopnea, PND, increased LE swelling, palpitations, dizziness or syncope.   Pt denies polydipsia, polyuria, or new focal neuro s/s.       Denies worsening depressive symptoms, suicidal ideation, or panic Wt Readings from Last 3 Encounters:  12/27/20 211 lb (95.7 kg)  11/19/20 210 lb 9.6 oz (95.5 kg)  10/15/20 217 lb (98.4 kg)   BP Readings from Last 3 Encounters:  12/27/20 116/70  11/19/20 100/72  10/15/20 110/78         Past Medical History:  Diagnosis Date   Allergy    Past hx    Anemia    Anxiety    Asthma    Cataract    removed left- forming on the right  DVT and PE from long travel trip    Chronic kidney disease 2006   kidney stones    Clotting disorder ([place]) 2016   PE - small    Complication of anesthesia    HARD TO WAKE UP AFTER 2ND EYE SURGERY   COVID-19 virus infection    Depression    DVT (deep venous thrombosis) ([place])    DUE TO A LONG TRAVEL   Fatty liver    Genital herpes 05/06/2018   H/O renal calculi 2006   Headache    MIGRAINES   History of kidney stones    H/O   History of methicillin resistant staphylococcus aureus (MRSA)    Hyperlipidemia    Internal hemorrhoid    PE (pulmonary embolism)    Peptic ulcer disease    Pneumonia    h/o   PTSD (post-traumatic stress disorder)    Sleep apnea    DOES NOT USE CPAP CURRENTLY-MASK DOES NOT FIT [facility]RECTLY [facility]13[facility]20[facility]   Spondylosis    Tubular adenoma of colon 2011   Dr  Olevia Perches   Past Surgical History:  Procedure Laterality Date   CATARACT EXTRACTION Left 1996   Dr Elonda Husky; OS   COLONOSCOPY     colonoscopy with polypectomy  2011   Dr Olevia Perches, hemorrhoids   CYSTOSCOPY W/ STONE MANIPULATION  2006   EYE SURGERY Left    INGUINAL HERNIA REPAIR Right 1990   POLYPECTOMY     SHOULDER SURGERY Right 12/17/2010   Dr French Ana ; shoulder impingement & torn tendon   TRANSURETHRAL RESECTION OF PROSTATE N/A 02/23/2020   Procedure: TRANSURETHRAL RESECTION OF THE PROSTATE (TURP);  Surgeon: Billey Co, MD;  Location: [facility] ORS;  Service: Urology;  Laterality: N/A;    reports that he has never smoked. He has never used smokeless tobacco. He reports current alcohol use. He reports that he does not use drugs. family history includes Cancer in his paternal grandmother; Diabetes in his father; Esophageal cancer in his maternal uncle; Glaucoma in his father; Heart attack in his maternal uncle; Heart attack (age of onset: 58) in his mother; Heart attack (age of  onset: 74) in his maternal grandmother; Stroke (age of onset: 89) in his father. Allergies  Allergen Reactions   Aleve [Naproxen] Rash    Patient stated it was years ago   Nsaids Other (See Comments)    Bleeding ulcers   Current Outpatient Medications on File Prior to Visit  Medication Sig Dispense Refill   acetaminophen (TYLENOL) 500 MG tablet Take 1,000 mg by mouth every 6 (six) hours as needed for moderate pain.     albuterol (PROVENTIL HFA;VENTOLIN HFA) 108 (90 Base) MCG/ACT inhaler Inhale 2 puffs into the lungs every 6 (six) hours as needed. For shortness of breath 1 Inhaler 1   aspirin EC 81 MG tablet Take 81 mg by mouth daily.     atorvastatin (LIPITOR) 10 MG tablet Take 1 tablet (10 mg total) by mouth daily. 90 tablet 3   Brinzolamide-Brimonidine 1-0.2 % SUSP Place 1 drop into the left eye 2 (two) times daily.      carboxymethylcellulose (REFRESH PLUS) 0.5 % SOLN Apply to eye.     Cholecalciferol (VITAMIN D3  PO) Take 1 tablet by mouth daily.     fluticasone (FLONASE) 50 MCG/ACT nasal spray Place 2 sprays into both nostrils daily as needed for allergies. 16 g 3   fluticasone (FLONASE) 50 MCG/ACT nasal spray Place into the nose.     ketoconazole (NIZORAL) 2 % cream Apply topically.     ketotifen (ZADITOR) 0.025 % ophthalmic solution Apply to eye.     Multiple Vitamin (MULTIVITAMIN) tablet Take 1 tablet by mouth daily.     OXcarbazepine (TRILEPTAL) 150 MG tablet Take 150 mg by mouth as needed (FOR ANXIETY/PTSD).     PARoxetine (PAXIL) 40 MG tablet TAKE ONE-HALF TABLET BY MOUTH DAILY FOR MENTAL HEALTH     sildenafil (VIAGRA) 100 MG tablet TAKE ONE TABLET BY MOUTH AS INSTRUCTED (TAKE 1 HOUR PRIOR TO SEXUAL ACTIVITY *DO NOT EXCEED 1 DOSE PER 24 HOUR PERIOD*)     SYMBICORT 160-4.5 MCG/ACT inhaler Inhale 2 puffs into the lungs in the morning and at bedtime.     tiZANidine (ZANAFLEX) 4 MG tablet      triamcinolone cream (KENALOG) 0.1 % Apply topically.     valACYclovir (VALTREX) 1000 MG tablet TAKE 1/2 TABLET BY MOUTH TWICE DAILY FOR 3 DAYS OR 1 TABLET BY MOUTH EVERY DAY FOR 5 DAYS AT ONSET OF BREAKOUT 90 tablet 1   [DISCONTINUED] omeprazole (PRILOSEC) 20 MG capsule Take 1 capsule (20 mg total) by mouth daily. 30 capsule 0   [DISCONTINUED] topiramate (TOPAMAX) 25 MG tablet topiramate 25 mg tablet (Patient not taking: No sig reported)     No current facility-administered medications on file prior to visit.        ROS:  All others reviewed and negative.  Objective        PE:  BP 116/70 (BP Location: Right Arm, Patient Position: Sitting, Cuff Size: Large)   Pulse 81   Temp 98.2 F (36.8 C) (Oral)   Ht 5' 7.5" (1.715 m)   Wt 211 lb (95.7 kg)   SpO2 97%   BMI 32.56 kg/m                 Constitutional: Pt appears in NAD               HENT: Head: NCAT.                Right Ear: External ear normal.  Left Ear: External ear normal.                Eyes: . Pupils are equal, round, and  reactive to light. Conjunctivae and EOM are normal               Nose: without d/c or deformity               Neck: Neck supple. Gross normal ROM               Cardiovascular: Normal rate and regular rhythm.                 Pulmonary/Chest: Effort normal and breath sounds without rales or wheezing.                Abd:  Soft, NT, ND, + BS, no organomegaly               Neurological: Pt is alert. At baseline orientation, motor grossly intact               Skin: LE edema - none, 3x3 cm right lateral neck scaly nontender non raised               Psychiatric: Pt behavior is normal without agitation   Micro: none  Cardiac tracings I have personally interpreted today:  none  Pertinent Radiological findings (summarize): none   Lab Results  Component Value Date   WBC 4.7 06/25/2020   HGB 14.7 06/25/2020   HCT 43.7 06/25/2020   PLT 250.0 06/25/2020   GLUCOSE 92 06/25/2020   CHOL 199 06/25/2020   TRIG 101.0 06/25/2020   HDL 51.50 06/25/2020   LDLDIRECT 89.0 04/20/2019   LDLCALC 127 (H) 06/25/2020   ALT 20 06/25/2020   AST 16 06/25/2020   NA 137 06/25/2020   K 4.3 06/25/2020   CL 103 06/25/2020   CREATININE 0.95 06/25/2020   BUN 10 06/25/2020   CO2 27 06/25/2020   TSH 0.70 06/25/2020   PSA 0.66 06/25/2020   HGBA1C 5.9 06/25/2020   Assessment/Plan:  NATANIEL PESSOLANO is a 55 y.o. Black or African American [2] male with  has a past medical history of Allergy, Anemia, Anxiety, Asthma, Cataract, Chronic kidney disease (2006), Clotting disorder ([place]) ([id number]), Complication of anesthesia, COVID-19 virus infection, Depression, DVT (deep venous thrombosis) ([place]), Fatty liver, Genital herpes (05/06/2018), H/O renal calculi (2006), Headache, History of kidney stones, History of methicillin resistant staphylococcus aureus (MRSA), Hyperlipidemia, Internal hemorrhoid, PE (pulmonary embolism), Peptic ulcer disease, Pneumonia, PTSD (post-traumatic stress disorder), Sleep apnea, Spondylosis, and Tubular  adenoma of colon (2011).  Cellulitis of left elbow Resolved, ok to follow for any recurrence  Atopic dermatitis, unspecified With flare, for depomedrol im 80, and refill lotrisone prn,  to f/u any worsening symptoms or concerns  Hyperglycemia Lab Results  Component Value Date   HGBA1C 5.9 06/25/2020   Stable, pt to continue current medical treatment  - diet   Anxiety Chronic persistent mild to mod, stable, declines change in tx today  Followup: Return in about 5 months (around 05/29/2021).  Cathlean Cower, MD 12/29/2020 8:55 PM [facility]

## 2020-12-29 NOTE — Assessment & Plan Note (Signed)
Chronic persistent mild to mod, stable, declines change in tx today

## 2021-01-08 ENCOUNTER — Other Ambulatory Visit: Payer: Self-pay

## 2021-01-08 ENCOUNTER — Ambulatory Visit
Admission: EM | Admit: 2021-01-08 | Discharge: 2021-01-08 | Disposition: A | Discharge status: Home / Self Care | Payer: No Typology Code available for payment source | Attending: Emergency Medicine | Admitting: Emergency Medicine

## 2021-01-08 ENCOUNTER — Encounter: Payer: Self-pay | Admitting: Emergency Medicine

## 2021-01-08 DIAGNOSIS — Z113 Encounter for screening for infections with a predominantly sexual mode of transmission: Secondary | ICD-10-CM | POA: Diagnosis present

## 2021-01-08 DIAGNOSIS — R300 Dysuria: Secondary | ICD-10-CM | POA: Diagnosis present

## 2021-01-08 LAB — POCT URINALYSIS DIP (MANUAL ENTRY)
Bilirubin, UA: NEGATIVE
Blood, UA: NEGATIVE
Glucose, UA: NEGATIVE mg/dL
Ketones, POC UA: NEGATIVE mg/dL
Leukocytes, UA: NEGATIVE
Nitrite, UA: NEGATIVE
Protein Ur, POC: NEGATIVE mg/dL
Spec Grav, UA: 1.015 (ref 1.010–1.025)
Urobilinogen, UA: 0.2 E.U./dL
pH, UA: 7 (ref 5.0–8.0)

## 2021-01-08 NOTE — ED Provider Notes (Signed)
HPI  SUBJECTIVE:  Anthony [name] is a 55 y.o. male who presents with 1 week of dysuria,  urinary frequency.  No nausea, vomiting, fevers, urinary urgency, cloudy or odorous urine, hematuria.  No genital rash, penile discharge, testicular/scrotal pain or swelling.  No abdominal, back, perineal pain, penile itching.  He is in a long-term monogamous relationship with a male, who is asymptomatic.  STDs are a concern today . He would like to be tested.  No antibiotics in the past month.  No antipyretic in the past 6 hours.  He has had similar symptoms before when he was found to have urethritis.  He has tried [facility] powders without improvement in his symptoms.  No aggravating factors.  He has a past medical history of UTI, nephrolithiasis, HSV, PE/DVT on aspirin only.  No history of gonorrhea, chlamydia, HIV, syphilis, trichomonas, diabetes, prostatitis, orchitis, epididymitis.  [id number], Hunt Oris, MD   Past Medical History:  Diagnosis Date   Allergy    Past hx    Anemia    Anxiety    Asthma    Cataract    removed left- forming on the right  DVT and PE from long travel trip    Chronic kidney disease 2006   kidney stones    Clotting disorder ([place]) 2016   PE - small    Complication of anesthesia    HARD TO WAKE UP AFTER 2ND EYE SURGERY   COVID-19 virus infection    Depression    DVT (deep venous thrombosis) ([place])    DUE TO A LONG TRAVEL   Fatty liver    Genital herpes 05/06/2018   H/O renal calculi 2006   Headache    MIGRAINES   History of kidney stones    H/O   History of methicillin resistant staphylococcus aureus (MRSA)    Hyperlipidemia    Internal hemorrhoid    PE (pulmonary embolism)    Peptic ulcer disease    Pneumonia    h/o   PTSD (post-traumatic stress disorder)    Sleep apnea    DOES NOT USE CPAP CURRENTLY-MASK DOES NOT FIT CORRECTLY [facility] (02-13-20)   Spondylosis    Tubular adenoma of colon 2011   Dr Olevia Perches    Past Surgical History:   Procedure Laterality Date   CATARACT EXTRACTION Left 1996   Dr Elonda Husky; OS   COLONOSCOPY     colonoscopy with polypectomy  2011   Dr Olevia Perches, hemorrhoids   CYSTOSCOPY W/ STONE MANIPULATION  2006   [place] Right 12/17/2010   Dr French Ana ; shoulder impingement & torn tendon   TRANSURETHRAL RESECTION OF PROSTATE N/A 02/23/2020   Procedure: TRANSURETHRAL RESECTION OF THE PROSTATE (TURP);  Surgeon: Billey Co, MD;  Location: [facility] OR[facility];  Service: Urology;  Laterality: N/A;    Family History  Problem Relation Age of Onset   Heart attack Mother 57   Stroke Father 71   Diabetes Father        PVD   Glaucoma Father        blindness   Cancer Paternal Grandmother        ? primary   Heart attack Maternal Uncle         X2,pre 55   Esophageal cancer Maternal Uncle    Heart attack Maternal Grandmother 73   Asthma Neg Hx  COPD Neg Hx    Colon cancer Neg Hx    Colon polyps Neg Hx    Stomach cancer Neg Hx    Rectal cancer Neg Hx     Social History   Tobacco Use   Smoking status: Never   Smokeless tobacco: Never  Vaping Use   Vaping Use: Never used  Substance Use Topics   Alcohol use: Yes    Alcohol/week: 0.0 standard drinks    Comment: Socially , < 2X/ month   Drug use: No    No current facility-administered medications for this encounter.  Current Outpatient Medications:    acetaminophen (TYLENOL) 500 MG tablet, Take 1,000 mg by mouth every 6 (six) hours as needed for moderate pain., Disp: , Rfl:    albuterol (PROVENTIL HFA;VENTOLIN HFA) 108 (90 Base) MCG/ACT inhaler, Inhale 2 puffs into the lungs every 6 (six) hours as needed. For shortness of breath, Disp: 1 Inhaler, Rfl: 1   aspirin EC 81 MG tablet, Take 81 mg by mouth daily., Disp: , Rfl:    atorvastatin (LIPITOR) 10 MG tablet, Take 1 tablet (10 mg total) by mouth daily., Disp: 90 tablet, Rfl: 3   Brinzolamide-Brimonidine 1-0.2 % SUSP,  Place 1 drop into the left eye 2 (two) times daily. , Disp: , Rfl:    carboxymethylcellulose (REFRESH PLUS) 0.5 % SOLN, Apply to eye., Disp: , Rfl:    Cholecalciferol (VITAMIN D3 PO), Take 1 tablet by mouth daily., Disp: , Rfl:    clotrimazole-betamethasone (LOTRISONE) cream, Use as directed twice per day as needed to the affected area, Disp: 45 g, Rfl: 1   fluticasone (FLONASE) 50 MCG/ACT nasal spray, Place 2 sprays into both nostrils daily as needed for allergies., Disp: 16 g, Rfl: 3   fluticasone (FLONASE) 50 MCG/ACT nasal spray, Place into the nose., Disp: , Rfl:    ketoconazole (NIZORAL) 2 % cream, Apply topically., Disp: , Rfl:    ketotifen (ZADITOR) 0.025 % ophthalmic solution, Apply to eye., Disp: , Rfl:    Multiple Vitamin (MULTIVITAMIN) tablet, Take 1 tablet by mouth daily., Disp: , Rfl:    OXcarbazepine (TRILEPTAL) 150 MG tablet, Take 150 mg by mouth as needed (FOR ANXIETY/PTSD)., Disp: , Rfl:    PARoxetine (PAXIL) 40 MG tablet, TAKE ONE-HALF TABLET BY MOUTH DAILY FOR MENTAL HEALTH, Disp: , Rfl:    sildenafil (VIAGRA) 100 MG tablet, TAKE ONE TABLET BY MOUTH AS INSTRUCTED (TAKE 1 HOUR PRIOR TO SEXUAL ACTIVITY *DO NOT EXCEED 1 DOSE PER 24 HOUR PERIOD*), Disp: , Rfl:    SYMBICORT 160-4.5 MCG/ACT inhaler, Inhale 2 puffs into the lungs in the morning and at bedtime., Disp: , Rfl:    tiZANidine (ZANAFLEX) 4 MG tablet, , Disp: , Rfl:    triamcinolone cream (KENALOG) 0.1 %, Apply topically., Disp: , Rfl:    valACYclovir (VALTREX) 1000 MG tablet, TAKE 1/2 TABLET BY MOUTH TWICE DAILY FOR 3 DAYS OR 1 TABLET BY MOUTH EVERY DAY FOR 5 DAYS AT ONSET OF BREAKOUT, Disp: 90 tablet, Rfl: 1  Allergies  Allergen Reactions   Aleve [Naproxen] Rash    Patient stated it was years ago   Nsaids Other (See Comments)    Bleeding ulcers     ROS  As noted in HPI.   Physical Exam  BP 126/79   Pulse 72   Temp 98.9 F (37.2 C) (Oral)   Resp 20   SpO2 100%   Constitutional: Well developed, well  nourished, no acute distress Eyes:  EOMI, conjunctiva normal  bilaterally HENT: Normocephalic, atraumatic,mucus membranes moist Respiratory: Normal inspiratory effort Cardiovascular: Normal rate GI: nondistended, soft.  No suprapubic, flank tenderness GU: Normal circumcised male, testes descended bilaterally.  No penile rash, discharge.  No inguinal lymphadenopathy.  No epididymal, testicular tenderness.  No scrotal swelling. patient declined chaperone. Rectal: Patient declined prostate exam skin: No rash, skin intact Musculoskeletal: no deformities Neurologic: Alert & oriented x 3, no focal neuro deficits Psychiatric: Speech and behavior appropriate   ED Course   Medications - No data to display  Orders Placed This Encounter  Procedures   Urine Culture    Standing Status:   Standing    Number of Occurrences:   1    Order Specific Question:   Indication    Answer:   Dysuria   POCT urinalysis dipstick    Standing Status:   Standing    Number of Occurrences:   1    Results for orders placed or performed during the hospital encounter of 01/08/21 (from the past 24 hour(s))  POCT urinalysis dipstick     Status: None   Collection Time: 01/08/21  8:17 AM  Result Value Ref Range   Color, UA yellow yellow   Clarity, UA clear clear   Glucose, UA negative negative mg/dL   Bilirubin, UA negative negative   Ketones, POC UA negative negative mg/dL   Spec Grav, UA 1.015 1.010 - 1.025   Blood, UA negative negative   pH, UA 7.0 5.0 - 8.0   Protein Ur, POC negative negative mg/dL   Urobilinogen, UA 0.2 0.2 or 1.0 E.U./dL   Nitrite, UA Negative Negative   Leukocytes, UA Negative Negative   No results found.  ED Clinical Impression  1. Dysuria   2. Screening for STDs (sexually transmitted diseases)      ED Assessment/Plan  Previous records reviewed.  No history of STDs, UTI.  Last 3 urine cultures have been negative for UTI.  Discussed with patient that prostatitis can give him  similar symptoms and that the treatment is different, however patient again declined prostate exam.  Gonorrhea, chlamydia, trichomonas sent.  Urine dip negative for UTI.  However, will send off for culture to confirm absence of UTI.  Will try some Pyridium for symptomatic relief.  Advised no intercourse until labs have returned, symptoms have improved, he and his partner is treated if necessary.  Follow-up with PMD as needed.  ER return precautions given.  Discussed labs, MDM, treatment plan, and plan for follow-up with patient. Discussed sn/sx that should prompt return to the ED. patient agrees with plan.   No orders of the defined types were placed in this encounter.     *This clinic note was created using Dragon dictation software. Therefore, there may be occasional mistakes despite careful proofreading.  ?    Melynda Ripple, MD 01/08/21 1456

## 2021-01-08 NOTE — ED Triage Notes (Signed)
Pt here with burning and tingling in the tip of his penis both when he urinates and after. Has concern for STI as well as a UTI. No discharge.

## 2021-01-08 NOTE — Discharge Instructions (Addendum)
Your urinalysis was normal.  You do not have a urinary tract infection, but I will send it off for culture to confirm absence of a UTI.  Your STD testing will be back in several days.  No intercourse until your labs are resulted and you and your partner have been treated if necessary.  Drink plenty of extra fluids.

## 2021-01-09 LAB — URINE CULTURE: Culture: NO GROWTH

## 2021-01-09 LAB — CYTOLOGY, (ORAL, ANAL, URETHRAL) ANCILLARY ONLY
Chlamydia: NEGATIVE
Comment: NEGATIVE
Comment: NEGATIVE
Comment: NORMAL
Neisseria Gonorrhea: NEGATIVE
Trichomonas: NEGATIVE

## 2021-01-12 ENCOUNTER — Encounter: Payer: Self-pay | Admitting: Emergency Medicine

## 2021-01-12 ENCOUNTER — Emergency Department: Payer: Medicare Other

## 2021-01-12 ENCOUNTER — Emergency Department
Admission: EM | Admit: 2021-01-12 | Discharge: 2021-01-12 | Disposition: A | Discharge status: Home / Self Care | Payer: Medicare Other | Attending: Emergency Medicine | Admitting: Emergency Medicine

## 2021-01-12 ENCOUNTER — Other Ambulatory Visit: Payer: Self-pay

## 2021-01-12 DIAGNOSIS — Z7982 Long term (current) use of aspirin: Secondary | ICD-10-CM | POA: Diagnosis not present

## 2021-01-12 DIAGNOSIS — J45909 Unspecified asthma, uncomplicated: Secondary | ICD-10-CM | POA: Insufficient documentation

## 2021-01-12 DIAGNOSIS — N189 Chronic kidney disease, unspecified: Secondary | ICD-10-CM | POA: Diagnosis not present

## 2021-01-12 DIAGNOSIS — M25531 Pain in right wrist: Secondary | ICD-10-CM | POA: Diagnosis not present

## 2021-01-12 DIAGNOSIS — Z8616 Personal history of COVID-19: Secondary | ICD-10-CM | POA: Insufficient documentation

## 2021-01-12 DIAGNOSIS — M545 Low back pain, unspecified: Secondary | ICD-10-CM | POA: Diagnosis present

## 2021-01-12 DIAGNOSIS — M5442 Lumbago with sciatica, left side: Secondary | ICD-10-CM | POA: Diagnosis not present

## 2021-01-12 MED ORDER — CYCLOBENZAPRINE HCL 5 MG PO TABS: 5.0000 mg | ORAL_TABLET | Freq: Three times a day (TID) | ORAL | 0 refills | Status: DC | PRN

## 2021-01-12 MED ORDER — TRAMADOL HCL 50 MG PO TABS: 50.0000 mg | ORAL_TABLET | Freq: Three times a day (TID) | ORAL | 0 refills | Status: DC | PRN

## 2021-01-12 MED ORDER — ACETAMINOPHEN 325 MG PO TABS
650.0000 mg | ORAL_TABLET | Freq: Once | ORAL | Status: AC
  Administered 2021-01-12: 650 mg via ORAL
  Filled 2021-01-12: qty 2

## 2021-01-12 NOTE — Discharge Instructions (Addendum)
You were seen today for acute low back pain and right wrist pain.  Your x-ray of your right wrist did not show any acute findings.  The x-ray of your lumbar spine did show some L4-L5 sclerosis of bony fragments but CT revealed that this was a chronic finding and not acute.  I have given you a prescription for muscle relaxers to take every 8 hours as needed, please be aware that this may cause sedation.  I have given you prescription for pain medication to take every 8 hours as needed, please be aware that this may also cause sedation.  We encourage stretching, ice massage.  Please follow-up with the spine specialist if symptoms persist.

## 2021-01-12 NOTE — ED Notes (Signed)
Pt ambulated to room from triage. RN to bedside. Pt went skating last night and he fell twice. He landed on his hip. He is having pain in left hip/leg area and some left wrist pain. Pt in no acute distress.

## 2021-01-12 NOTE — ED Triage Notes (Signed)
Pt reports was roller skating with his daughter last pm and fell several times. Pt c/p pain to lower back, left leg and right wrist

## 2021-01-12 NOTE — ED Provider Notes (Signed)
[facility] Emergency Department Provider Note ____________________________________________  Time seen: 0815  I have reviewed the triage vital signs and the nursing notes.  HISTORY  Chief Complaint  Back Pain, Wrist Pain, and Leg Injury   HPI Anthony [name] is a 55 y.o. male presents to the ER today with complaint of right wrist pain and low back pain status post a fall while rollerskating yesterday afternoon.  He reports he did take multiple falls.  He describes the right wrist as sore and achy.  The pain does not radiate.  He denies any joint swelling, numbness, tingling or weakness of his right hand.  He describes the low back pain as sore and achy, more on the left than the right.  The pain does radiate into his left upper leg.  The pain is worse with laying on his left side or trying to bear weight.  He denies numbness, tingling or weakness of his left lower extremity.  He denies any issues with bowel or bladder control.  He denies any prior wrist injury or wrist surgery.  He reports history of back issues in the past.  He has taken Tizanidine with minimal relief of symptoms.  Past Medical History:  Diagnosis Date   Allergy    Past hx    Anemia    Anxiety    Asthma    Cataract    removed left- forming on the right  DVT and PE from long travel trip    Chronic kidney disease 2006   kidney stones    Clotting disorder ([place]) 2016   PE - small    Complication of anesthesia    HARD TO WAKE UP AFTER 2ND EYE SURGERY   COVID-19 virus infection    Depression    DVT (deep venous thrombosis) (HCC)    DUE TO A LONG TRAVEL   Fatty liver    Genital herpes 05/06/2018   H/O renal calculi 2006   Headache    MIGRAINES   History of kidney stones    H/O   History of methicillin resistant staphylococcus aureus (MRSA)    Hyperlipidemia    Internal hemorrhoid    PE (pulmonary embolism)    Peptic ulcer disease    Pneumonia    h/o   PTSD (post-traumatic stress disorder)     Sleep apnea    DOES NOT USE CPAP CURRENTLY-MASK DOES NOT FIT CORRECTLY [facility]   Spondylosis    Tubular adenoma of colon 2011   Dr Olevia Perches    Patient Active Problem List   Diagnosis Date Noted   Cellulitis of left elbow 11/19/2020   Allergic rhinitis 10/15/2020   Brow ptosis, left 10/15/2020   Bunion of right foot 10/15/2020   Contact with and (suspected) exposure to covid-19 10/15/2020   COVID-19 10/15/2020   Deposits (accretions) on teeth 10/15/2020   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, left eye, stage unspecified 10/15/2020   Hematuria, unspecified 10/15/2020   Inflammatory disease of prostate, unspecified 10/15/2020   Male erectile disorder (CODE) 10/15/2020   Male erectile dysfunction, unspecified 10/15/2020   Nondependent alcohol abuse, continuous drinking behavior 10/15/2020   Obesity 10/15/2020   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   Unspecified ptosis of left eyelid 10/15/2020   Unspecified symptoms and signs involving cognitive functions and awareness 10/15/2020   Other recurrent depressive disorders ([place]) 10/15/2020   Post-traumatic stress disorder, unspecified 10/15/2020   Adjustment disorder, unspecified 10/15/2020   Encounter for dental  examination and cleaning without abnormal findings 10/15/2020   Recurrent major depression ([place]) 10/15/2020   Major depressive disorder, recurrent, unspecified ([place]) 10/15/2020   Unspecified mood (affective) disorder ([place]) 10/15/2020   Dietary counseling and surveillance 10/15/2020   Other specified counseling 10/15/2020   Headache, unspecified 10/15/2020   Herpes simplex 10/15/2020   Migraine with aura, intractable, with status migrainosus 10/15/2020   Chronic migraine without aura, not intractable, without status migrainosus 10/15/2020   Low back pain, unspecified 10/15/2020   Pain in left leg 10/15/2020   Obstructive sleep apnea (adult) (pediatric) 10/15/2020   Other asthma 10/15/2020    Meralgia paresthetica of left side 07/03/2020   Major depressive disorder, single episode, unspecified 06/25/2020   Leg cramping 09/04/2019   Pain in joint of right elbow 08/03/2019   Lateral epicondylitis of right elbow 08/03/2019   Nephrolithiasis 05/11/2019   Epigastric pain 05/11/2019   Other constipation 05/11/2019   Right sided abdominal pain 05/11/2019   Paresthesia 04/20/2019   Chronic post-traumatic stress disorder 01/14/2019   Left ear hearing loss 01/14/2019   Low back pain 01/14/2019   Intractable migraine with aura without status migrainosus 01/07/2019   Genital herpes 05/06/2018   STD exposure 05/06/2018   Obstructive sleep apnea 02/13/2018   Hyperglycemia 01/24/2018   Pelvic pain 07/10/2017   Exposure to blood or body fluid 07/07/2017   Trigger thumb of right hand 05/21/2017   Trigger finger, right middle finger 03/18/2017   Anxiety 03/18/2017   Chronic tension-type headache, not intractable 02/12/2017   Herpes simplex type II infection 12/02/2016   Dysuria 12/02/2016   Pain of right thumb 11/13/2016   Other chest pain 07/27/2016   Onychomycosis 06/12/2016   Encounter for well adult exam with abnormal findings 06/12/2016   Generalized headache 05/15/2016   Lumbar paraspinal muscle spasm 05/15/2016   History of DVT (deep vein thrombosis) 11/18/2015   Elevated PSA 11/18/2015   Acute pulmonary embolism ([place]) 2016 05/09/2015   Peptic ulcer 05/09/2015   Dyspnea 05/02/2015   DVT (deep venous thrombosis) ([place]) 04/26/2015   Duodenal ulcer 04/25/2015   Positive D dimer 04/23/2015   Internal hemorrhoid, bleeding 04/04/2015   Acute gastrointestinal hemorrhage 04/03/2015   Duodenitis 04/03/2015   Postural dizziness with presyncope 04/03/2015   Microscopic hematuria 04/03/2015   Dislocated IOL (intraocular lens), initial encounter 12/10/2014   MRSA infection 09/14/2013   History of methicillin resistant Staphylococcus aureus infection 02/17/2013   Spondylosis of  lumbar joint 09/28/2012   Fatty liver disease, nonalcoholic 0000000   Lumbosacral spondylosis without myelopathy 09/28/2012   Fatty metamorphosis of liver 09/28/2012   Atopic dermatitis, unspecified 05/13/2012   Seasonal and perennial allergic rhinitis 10/01/2011   Family history of ischemic heart disease 04/02/2011   Low serum testosterone level 03/12/2011   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/12/2011   COLONIC POLYPS, HX OF 06/21/2009   HLD (hyperlipidemia) 02/16/2008   Asthma, mild persistent 02/16/2008   NONSPECIFIC ABNORMAL ELECTROCARDIOGRAM 02/16/2008   NEPHROLITHIASIS, HX OF 02/16/2008    Past Surgical History:  Procedure Laterality Date   CATARACT EXTRACTION Left 1996   Dr Elonda Husky; OS   COLONOSCOPY     colonoscopy with polypectomy  2011   Dr Olevia Perches, hemorrhoids   CYSTOSCOPY W/ STONE MANIPULATION  2006   [place] Right 12/17/2010   Dr French Ana ; shoulder impingement & torn tendon   TRANSURETHRAL RESECTION OF PROSTATE N/A 02/23/2020   Procedure: TRANSURETHRAL  RESECTION OF THE PROSTATE (TURP);  Surgeon: Billey Co, MD;  Location: [facility] ORS;  Service: Urology;  Laterality: N/A;    Prior to Admission medications   Medication Sig Start Date End Date Taking? Authorizing Provider  cyclobenzaprine (FLEXERIL) 5 MG tablet Take 1 tablet (5 mg total) by mouth 3 (three) times daily as needed for muscle spasms. 01/12/21  Yes Wyndham Santilli, Coralie Keens, NP  traMADol (ULTRAM) 50 MG tablet Take 1 tablet (50 mg total) by mouth every 8 (eight) hours as needed. 01/12/21 01/12/22 Yes Shamirah Ivan, Coralie Keens, NP  acetaminophen (TYLENOL) 500 MG tablet Take 1,000 mg by mouth every 6 (six) hours as needed for moderate pain.    [provider]  albuterol (PROVENTIL HFA;VENTOLIN HFA) 108 (90 Base) MCG/ACT inhaler Inhale 2 puffs into the lungs every 6 (six) hours as needed. For shortness of breath 06/29/16   [place], Betty G, MD  aspirin EC 81  MG tablet Take 81 mg by mouth daily.    [provider]  atorvastatin (LIPITOR) 10 MG tablet Take 1 tablet (10 mg total) by mouth daily. 06/25/20 06/25/21  Biagio Borg, MD  Brinzolamide-Brimonidine 1-0.2 % SUSP Place 1 drop into the left eye 2 (two) times daily.     [provider]  carboxymethylcellulose (REFRESH PLUS) 0.5 % SOLN Apply to eye. 11/15/20   [provider]  Cholecalciferol (VITAMIN D3 PO) Take 1 tablet by mouth daily.    [provider]  clotrimazole-betamethasone (LOTRISONE) cream Use as directed twice per day as needed to the affected area 12/27/20   Biagio Borg, MD  fluticasone [facility]) 50 MCG/ACT nasal spray Place into the nose. 07/12/20   [provider]  ketoconazole (NIZORAL) 2 % cream Apply topically. 07/12/20   [provider]  ketotifen (ZADITOR) 0.025 % ophthalmic solution Apply to eye. 11/15/20   [provider]  Multiple Vitamin (MULTIVITAMIN) tablet Take 1 tablet by mouth daily.    [provider]  OXcarbazepine (TRILEPTAL) 150 MG tablet Take 150 mg by mouth as needed (FOR ANXIETY/PTSD).    [provider]  PARoxetine (PAXIL) 40 MG tablet TAKE ONE-HALF TABLET BY MOUTH DAILY FOR MENTAL HEALTH 01/01/20   [provider]  phenazopyridine (PYRIDIUM) 100 MG tablet Take 200 mg by mouth 3 (three) times daily. 01/10/21   [provider]  sildenafil (VIAGRA) 100 MG tablet TAKE ONE TABLET BY MOUTH AS INSTRUCTED (TAKE 1 HOUR PRIOR TO SEXUAL ACTIVITY *DO NOT EXCEED 1 DOSE PER 24 HOUR PERIOD*) 07/12/20   [provider]  triamcinolone cream (KENALOG) 0.1 % Apply topically. 09/06/20   [provider]  valACYclovir (VALTREX) 1000 MG tablet TAKE 1/2 TABLET BY MOUTH TWICE DAILY FOR 3 DAYS OR 1 TABLET BY MOUTH EVERY DAY FOR 5 DAYS AT ONSET OF BREAKOUT 12/06/19   Biagio Borg, MD  omeprazole (PRILOSEC) 20 MG capsule Take 1 capsule (20 mg total) by mouth daily. 04/03/20 04/13/20   Faustino Congress, NP  topiramate (TOPAMAX) 25 MG tablet topiramate 25 mg tablet Patient not taking: No sig reported  04/13/20  [provider]    Allergies Aleve [naproxen] and Nsaids  Family History  Problem Relation Age of Onset   Heart attack Mother 24   Stroke Father 53   Diabetes Father        PVD   Glaucoma Father        blindness   Cancer Paternal Grandmother        ? primary  Heart attack Maternal Uncle         X2,pre 55   Esophageal cancer Maternal Uncle    Heart attack Maternal Grandmother 73   Asthma Neg Hx    COPD Neg Hx    Colon cancer Neg Hx    Colon polyps Neg Hx    Stomach cancer Neg Hx    Rectal cancer Neg Hx     Social History Social History   Tobacco Use   Smoking status: Never   Smokeless tobacco: Never  Vaping Use   Vaping Use: Never used  Substance Use Topics   Alcohol use: Yes    Alcohol/week: 0.0 standard drinks    Comment: Socially , < 2X/ month   Drug use: No    Review of Systems  Constitutional: Negative for fever, chills or body aches. Cardiovascular: Negative for chest pain or chest tightness. Respiratory: Negative for cough or shortness of breath. Gastrointestinal: Negative for nausea, vomiting, constipation, diarrhea or loss of bowel control. Genitourinary: Negative for urgency, frequency, dysuria, blood in his urine or loss of bladder control. Musculoskeletal: Positive for right wrist pain, left-sided low back pain, decreased range of motion. Skin: Negative for rash, bruising or abrasion. Neurological: Negative for focal weakness, tingling or numbness. ____________________________________________  PHYSICAL EXAM:  VITAL SIGNS: ED Triage Vitals  Enc Vitals Group     BP 01/12/21 0804 (!) 113/93     Pulse Rate 01/12/21 0804 66     Resp 01/12/21 0804 18     Temp 01/12/21 0804 98 F (36.7 C)     Temp Source 01/12/21 0804 Oral     SpO2 01/12/21 0804 97 %     Weight 01/12/21 0757 209 lb (94.8 kg)     Height  01/12/21 0757 '5\' 7"'$  (1.702 m)     Head Circumference --      Peak Flow --      Pain Score 01/12/21 0756 8     Pain Loc --      Pain Edu? --      Excl. in [place]? --     Constitutional: Alert and oriented.  Obese, appears uncomfortable but in no distress. Head: Normocephalic and atraumatic. Eyes:  Normal extraocular movements Cardiovascular: Normal rate, regular rhythm.  Radial and pedal pulses 2+ bilaterally. Respiratory: Normal respiratory effort. No wheezes/rales/rhonchi noted. Musculoskeletal: Decreased flexion and extension of the lumbar spine secondary to pain.  Normal rotation and lateral bending of the lumbar spine.  Bony tenderness noted over the lumbar spine.  No pain with palpation of the left paralumbar muscles.  Strength 4/5 LLE, 5/5 RLE.  Able to stand on tiptoes and heels.  Limping gait. Neurologic: Normal speech and language.  Skin:  Skin is warm, dry and intact. No rash bruising or abrasion noted. ____________________________________________   RADIOLOGY  Imaging Orders         DG Wrist Complete Right         DG Lumbar Spine 2-3 Views         CT Lumbar Spine Wo Contrast     IMPRESSION: Negative.  IMPRESSION: Diffuse increased sclerosis with bony fragments and lucencies throughout the posterior elements of L4 and L5. Although the findings are partly chronic, superimposed acute process is difficult to exclude. Recommend further evaluation with lumbar CT.  IMPRESSION: 1. No acute osseous abnormality in the lumbar spine. A chronic L4 spinous process fracture is stable from a CT last year. 2. Moderate to severe lower lumbar facet arthropathy including vacuum facet  phenomena. But comparatively mild lumbar disc degeneration and no spinal or lateral recess stenosis. Up to mild L4 and L5 neural foraminal stenosis appears stable since the 2020 MRI. 3. Chronic SI joint degeneration. 4. Partially visible distension of the urinary bladder.   ____________________________________________    INITIAL IMPRESSION / ASSESSMENT AND PLAN / ED COURSE  Acute Right Wrist Pain, Acute Low Back Pain:  DDx include right wrist fracture, sprain of right wrist, lumbar strain, muscular strain of left paralumbar muscles, lumbar radiculopathy Xray right wrist negative Xray lumbar spine shows some sclerosis with bony fragments of L4-L5, unclear wether this is acute or chronic Tylenol 650 mg PO x 1 CT lumbar spine for further evaluation of abnormal xray shows chronicL4 spinous process fracture, moderate to severe lumbar fact arthropathy, mild lumbar disc disease, mild L4-L5 neural foraminal stenosis that appears stable from his MRI in 2020 Rx for Flexeril 5 mg Q8H prn- sedation caution given RX for Tramadol 50 mg Q8H prn- sedation caution given Encouraged stretching, ice and massage Will have [facility] [place] [facility] he is currently seen as a patient     I reviewed the patient's prescription history over the last 12 months in the multi-state controlled substances database(s) that includes [place], [place], [place], [place], [place], [place], [place], [place], [place], [place], [place], [place], [place], and [place].  Results were notable for Hydrocodone 5-325 mg # 6 02/2020 ____________________________________________  FINAL CLINICAL IMPRESSION(S) / ED DIAGNOSES  Final diagnoses:  Acute midline low back pain with left-sided sciatica  Acute pain of right wrist      Jearld Fenton, NP 01/12/21 1033    Harvest Dark, MD 01/12/21 [phone]

## 2021-01-22 ENCOUNTER — Ambulatory Visit
Admission: EM | Admit: 2021-01-22 | Discharge: 2021-01-22 | Disposition: A | Discharge status: Home / Self Care | Payer: Medicare Other | Attending: Emergency Medicine | Admitting: Emergency Medicine

## 2021-01-22 ENCOUNTER — Telehealth: Payer: Self-pay | Admitting: Internal Medicine

## 2021-01-22 ENCOUNTER — Other Ambulatory Visit: Payer: Self-pay

## 2021-01-22 ENCOUNTER — Encounter: Payer: Self-pay | Admitting: Emergency Medicine

## 2021-01-22 ENCOUNTER — Encounter: Payer: Self-pay | Admitting: Internal Medicine

## 2021-01-22 DIAGNOSIS — K602 Anal fissure, unspecified: Secondary | ICD-10-CM

## 2021-01-22 LAB — POC HEMOCCULT BLD/STL (OFFICE/1-CARD/DIAGNOSTIC): Fecal Occult Blood, POC: NEGATIVE

## 2021-01-22 NOTE — ED Triage Notes (Signed)
Patient reports seeing blood with bowel movement this morning.  Blood was bright red.  Denies abdominal pain or back pain.  Reports a soft bm this morning, followed by a slightly harder stool.  Currently feels gas on stomach, particularly left side of abdomen

## 2021-01-22 NOTE — ED Provider Notes (Signed)
Chief Complaint   Chief Complaint  Patient presents with   Rectal Bleeding     Subjective, HPI  Anthony [name] is a very pleasant 55 y.o. male who presents with bright red blood in stool today.  Patient does not report any abdominal pain or back pain.  He does report a soft bowel movement this morning followed by a slightly harder stool.  Patient reports a gassy sensation to his stomach.  Patient reports the use of an anal toy today. No fever, chills, vomiting or dark red blood in stool.  History obtained from patient.   Patient's problem list, past medical and social history, medications, and allergies were reviewed by me and updated in Epic.    ROS  See HPI.  Objective   Vitals:   01/22/21 1526  BP: (!) 148/87  Pulse: 92  Resp: 20  Temp: 99.1 F (37.3 C)  SpO2: 99%    Vital signs and nursing note reviewed.   No LMP for male patient.   General: Appears well-developed and well-nourished. No acute distress.  HEENT Head: Normocephalic and atraumatic.  Eyes: Conjunctivae and EOM are normal. No eye drainage or scleral icterus bilaterally.  Ears: Hearing grossly intact, no drainage or visible deformity.  Neck: Normal range of motion, neck is supple.  Cardiovascular: Normal rate.  Pulm/Chest: No respiratory distress.  Rectal: Anal fissure noted to anterior portion of rectal vault. Musculoskeletal: No joint deformity, normal range of motion.  Neurological: Alert and oriented to person, place, and time.  Skin: Skin is warm and dry.  Psychiatric: Normal mood, affect, behavior, and thought content.   Assessment & Plan  1. Anal fissure   55 y.o. male presents with bright red blood in stool today.  Patient does not report any abdominal pain or back pain.  He does report a soft bowel movement this morning followed by a slightly harder stool.  Patient reports a gassy sensation to his stomach.  Patient reports the use of an anal toy today. no fever, chills, vomiting or dark red  blood in stool.  Hemoccult:negative.  Likely cause of the patient's bleeding secondary to anal fissure.  Advised to drink lots of fluid, increase fiber intake, daily stool softener.  Advised to return with any worsening of pain, more blood with stooling, fever, abdominal pain.  Patient verbalized understanding and agreed with plan.  Patient stable upon discharge.  Return as needed.  Plan:   Discharge Instructions      Drink lots of fluid  Increase fiber intake  Consider a daily stool softener  If you notice any worsening of pain, more blood with stooling, fever, abdominal pain return to clinic for recheck.         Serafina Royals, FNP 01/22/21 1640

## 2021-01-22 NOTE — Discharge Instructions (Signed)
Drink lots of fluid  Increase fiber intake  Consider a daily stool softener  If you notice any worsening of pain, more blood with stooling, fever, abdominal pain return to clinic for recheck.

## 2021-01-24 NOTE — Telephone Encounter (Signed)
[facility] FYI...   Caller states he is having blood in his stool that started yesterday. states he has had 2 stools today that turned the water red.   Advised to go to ED now. Patient understood and went to [facility] ED.

## 2021-01-27 ENCOUNTER — Other Ambulatory Visit: Payer: Self-pay

## 2021-01-28 ENCOUNTER — Encounter: Payer: Self-pay | Admitting: Internal Medicine

## 2021-01-28 ENCOUNTER — Ambulatory Visit (INDEPENDENT_AMBULATORY_CARE_PROVIDER_SITE_OTHER): Payer: Medicare Other | Admitting: Internal Medicine

## 2021-01-28 VITALS — BP 118/80 | HR 96 | Temp 97.9°F | Resp 18 | Ht 68.0 in | Wt 214.0 lb

## 2021-01-28 DIAGNOSIS — K648 Other hemorrhoids: Secondary | ICD-10-CM | POA: Diagnosis not present

## 2021-01-28 DIAGNOSIS — J4530 Mild persistent asthma, uncomplicated: Secondary | ICD-10-CM | POA: Diagnosis not present

## 2021-01-28 DIAGNOSIS — M79605 Pain in left leg: Secondary | ICD-10-CM | POA: Diagnosis not present

## 2021-01-28 DIAGNOSIS — R739 Hyperglycemia, unspecified: Secondary | ICD-10-CM

## 2021-01-28 DIAGNOSIS — F419 Anxiety disorder, unspecified: Secondary | ICD-10-CM

## 2021-01-28 NOTE — Progress Notes (Signed)
Patient ID: Anthony [name], male   DOB: Apr 19, 1966, 55 y.o.   MRN: [id number]       Chief Complaint: follow up left upper leg pain, BRBPR, hyperglycemia, asthma       HPI:  Anthony [name] is a 55 y.o. male here with c/o 1 wk worsening left upper leg pain, mild to mod, intermittent lateral aspect with radaition to the left buttock but not lower back, sharp, without LE radicular pain, numbness or weakness, and started after a fall to the right lateral hip area, which was sore and bruised but now essentially resolved.  Also, Denies worsening reflux, abd pain, dysphagia, n/v, bowel change or blood except for occasional spotty BRBPR on tissue in the past month without pain. Pt has hx of PUD with EGD/colonoscopy 2016 with duodenal ulcer and internal hemorrhoid only.  Pt denies chest pain, increased sob or doe, wheezing, orthopnea, PND, increased LE swelling, palpitations, dizziness or syncope.   Pt denies polydipsia, polyuria, or new focal neuro s/s.   Pt denies fever, wt loss, night sweats, loss of appetite, or other constitutional symptoms        Wt Readings from Last 3 Encounters:  01/28/21 214 lb (97.1 kg)  01/12/21 209 lb (94.8 kg)  12/27/20 211 lb (95.7 kg)   BP Readings from Last 3 Encounters:  01/28/21 118/80  01/22/21 (!) 148/87  01/12/21 112/90         Past Medical History:  Diagnosis Date   Allergy    Past hx    Anemia    Anxiety    Asthma    Cataract    removed left- forming on the right  DVT and PE from long travel trip    Chronic kidney disease 2006   kidney stones    Clotting disorder ([place]) 2016   PE - small    Complication of anesthesia    HARD TO WAKE UP AFTER 2ND EYE SURGERY   COVID-19 virus infection    Depression    DVT (deep venous thrombosis) ([place])    DUE TO A LONG TRAVEL   Fatty liver    Genital herpes 05/06/2018   H/O renal calculi 2006   Headache    MIGRAINES   History of kidney stones    H/O   History of methicillin resistant staphylococcus aureus (MRSA)     Hyperlipidemia    Internal hemorrhoid    PE (pulmonary embolism)    Peptic ulcer disease    Pneumonia    h/o   PTSD (post-traumatic stress disorder)    Sleep apnea    DOES NOT USE CPAP CURRENTLY-MASK DOES NOT FIT CORRECTLY [facility]13-20)   Spondylosis    Tubular adenoma of colon 2011   Dr Olevia Perches   Past Surgical History:  Procedure Laterality Date   CATARACT EXTRACTION Left 1996   Dr Elonda Husky; OS   COLONOSCOPY     colonoscopy with polypectomy  2011   Dr Olevia Perches, hemorrhoids   CYSTOSCOPY W/ STONE MANIPULATION  2006   [place] Right 12/17/2010   Dr French Ana ; shoulder impingement & torn tendon   TRANSURETHRAL RESECTION OF PROSTATE N/A 02/23/2020   Procedure: TRANSURETHRAL RESECTION OF THE PROSTATE (TURP);  Surgeon: Billey Co, MD;  Location: [facility] ORS;  Service: Urology;  Laterality: N/A;    reports that he has never smoked.  He has never used smokeless tobacco. He reports current alcohol use. He reports that he does not use drugs. family history includes Cancer in his paternal grandmother; Diabetes in his father; Esophageal cancer in his maternal uncle; Glaucoma in his father; Heart attack in his maternal uncle; Heart attack (age of onset: 49) in his mother; Heart attack (age of onset: 57) in his maternal grandmother; Stroke (age of onset: 41) in his father. Allergies  Allergen Reactions   Aleve [Naproxen] Rash    Patient stated it was years ago   Nsaids Other (See Comments)    Bleeding ulcers   Current Outpatient Medications on File Prior to Visit  Medication Sig Dispense Refill   acetaminophen (TYLENOL) 500 MG tablet Take 1,000 mg by mouth every 6 (six) hours as needed for moderate pain.     albuterol (PROVENTIL HFA;VENTOLIN HFA) 108 (90 Base) MCG/ACT inhaler Inhale 2 puffs into the lungs every 6 (six) hours as needed. For shortness of breath 1 Inhaler 1   aspirin EC  81 MG tablet Take 81 mg by mouth daily.     atorvastatin (LIPITOR) 10 MG tablet Take 1 tablet (10 mg total) by mouth daily. 90 tablet 3   Brinzolamide-Brimonidine 1-0.2 % SUSP Place 1 drop into the left eye 2 (two) times daily.      carboxymethylcellulose (REFRESH PLUS) 0.5 % SOLN Apply to eye.     Cholecalciferol (VITAMIN D3 PO) Take 1 tablet by mouth daily.     clotrimazole-betamethasone (LOTRISONE) cream Use as directed twice per day as needed to the affected area 45 g 1   cyclobenzaprine (FLEXERIL) 5 MG tablet Take 1 tablet (5 mg total) by mouth 3 (three) times daily as needed for muscle spasms. 15 tablet 0   fluticasone (FLONASE) 50 MCG/ACT nasal spray Place into the nose.     ketoconazole (NIZORAL) 2 % cream Apply topically.     ketotifen (ZADITOR) 0.025 % ophthalmic solution Apply to eye.     Multiple Vitamin (MULTIVITAMIN) tablet Take 1 tablet by mouth daily.     OXcarbazepine (TRILEPTAL) 150 MG tablet Take 150 mg by mouth as needed (FOR ANXIETY/PTSD).     PARoxetine (PAXIL) 40 MG tablet TAKE ONE-HALF TABLET BY MOUTH DAILY FOR MENTAL HEALTH     phenazopyridine (PYRIDIUM) 100 MG tablet Take 200 mg by mouth 3 (three) times daily.     sildenafil (VIAGRA) 100 MG tablet TAKE ONE TABLET BY MOUTH AS INSTRUCTED (TAKE 1 HOUR PRIOR TO SEXUAL ACTIVITY *DO NOT EXCEED 1 DOSE PER 24 HOUR PERIOD*)     traMADol (ULTRAM) 50 MG tablet Take 1 tablet (50 mg total) by mouth every 8 (eight) hours as needed. 15 tablet 0   triamcinolone cream (KENALOG) 0.1 % Apply topically.     valACYclovir (VALTREX) 1000 MG tablet TAKE 1/2 TABLET BY MOUTH TWICE DAILY FOR 3 DAYS OR 1 TABLET BY MOUTH EVERY DAY FOR 5 DAYS AT ONSET OF BREAKOUT 90 tablet 1   [DISCONTINUED] omeprazole (PRILOSEC) 20 MG capsule Take 1 capsule (20 mg total) by mouth daily. 30 capsule 0   [DISCONTINUED] topiramate (TOPAMAX) 25 MG tablet topiramate 25 mg tablet (Patient not taking: No sig reported)     No current facility-administered medications on  file prior to visit.        ROS:  All others reviewed and negative.  Objective        PE:  BP 118/80   Pulse 96   Temp 97.9 F (36.6 C) (Oral)  Resp 18   Ht 5\' 8"  (1.727 m)   Wt 214 lb (97.1 kg)   SpO2 97%   BMI 32.54 kg/m                 Constitutional: Pt appears in NAD               HENT: Head: NCAT.                Right Ear: External ear normal.                 Left Ear: External ear normal.                Eyes: . Pupils are equal, round, and reactive to light. Conjunctivae and EOM are normal               Nose: without d/c or deformity               Neck: Neck supple. Gross normal ROM               Cardiovascular: Normal rate and regular rhythm.                 Pulmonary/Chest: Effort normal and breath sounds without rales or wheezing.                Abd:  Soft, NT, ND, + BS, no organomegaly               Lumbar spine and paravertebral and area over left greater trochanter nontender , no swelling or bruising o/w neuovasc intact               Neurological: Pt is alert. At baseline orientation, motor grossly intact               Skin: Skin is warm. No rashes, no other new lesions, LE edema - none               Psychiatric: Pt behavior is normal without agitation   Micro: none  Cardiac tracings I have personally interpreted today:  none  Pertinent Radiological findings (summarize):    Lab Results  Component Value Date   WBC 4.7 06/25/2020   HGB 14.7 06/25/2020   HCT 43.7 06/25/2020   PLT 250.0 06/25/2020   GLUCOSE 92 06/25/2020   CHOL 199 06/25/2020   TRIG 101.0 06/25/2020   HDL 51.50 06/25/2020   LDLDIRECT 89.0 04/20/2019   LDLCALC 127 (H) 06/25/2020   ALT 20 06/25/2020   AST 16 06/25/2020   NA 137 06/25/2020   K 4.3 06/25/2020   CL 103 06/25/2020   CREATININE 0.95 06/25/2020   BUN 10 06/25/2020   CO2 27 06/25/2020   TSH 0.70 06/25/2020   PSA 0.66 06/25/2020   HGBA1C 5.9 06/25/2020   Assessment/Plan:  LEOR WHYTE is a 55 y.o. Black or African  American [2] male with  has a past medical history of Allergy, Anemia, Anxiety, Asthma, Cataract, Chronic kidney disease (2006), Clotting disorder ([place]) (1610), Complication of anesthesia, COVID-19 virus infection, Depression, DVT (deep venous thrombosis) ([place]), Fatty liver, Genital herpes (05/06/2018), H/O renal calculi (2006), Headache, History of kidney stones, History of methicillin resistant staphylococcus aureus (MRSA), Hyperlipidemia, Internal hemorrhoid, PE (pulmonary embolism), Peptic ulcer disease, Pneumonia, PTSD (post-traumatic stress disorder), Sleep apnea, Spondylosis, and Tubular adenoma of colon (2011).  Hyperglycemia Lab Results  Component Value Date   HGBA1C 5.9 06/25/2020   Stable, pt to continue current medical treatment  -  diet  Internal hemorrhoid, bleeding D/w pt, declines f/u with GI for now or anusol supp prn, will continue to monitor  Pain in left leg Pt with hx of left meralgia, but this left pain c/w more msk strain post fall involving the right hip area, exam benign, pt declines further imaging, will continue to follow with tylenol prn  Asthma, mild persistent Currently stable, cont current med tx - albuterol hfa prn  Anxiety Chronic overall stable, no longer abusing ETOH, declines need for change of med tx or counseling for now  Followup: No follow-ups on file.  Cathlean Cower, MD 01/31/2021 6:46 AM [facility]

## 2021-01-31 NOTE — Assessment & Plan Note (Signed)
Chronic overall stable, no longer abusing ETOH, declines need for change of med tx or counseling for now

## 2021-01-31 NOTE — Assessment & Plan Note (Signed)
Pt with hx of left meralgia, but this left pain c/w more msk strain post fall involving the right hip area, exam benign, pt declines further imaging, will continue to follow with tylenol prn

## 2021-01-31 NOTE — Assessment & Plan Note (Signed)
Lab Results  Component Value Date   HGBA1C 5.9 06/25/2020   Stable, pt to continue current medical treatment  - diet

## 2021-01-31 NOTE — Assessment & Plan Note (Signed)
D/w pt, declines f/u with GI for now or anusol supp prn, will continue to monitor

## 2021-01-31 NOTE — Assessment & Plan Note (Signed)
Currently stable, cont current med tx - albuterol hfa prn

## 2021-02-24 ENCOUNTER — Encounter: Payer: Self-pay | Admitting: Internal Medicine

## 2021-04-22 ENCOUNTER — Encounter: Payer: Self-pay | Admitting: Internal Medicine

## 2021-04-22 DIAGNOSIS — B009 Herpesviral infection, unspecified: Secondary | ICD-10-CM

## 2021-04-24 ENCOUNTER — Other Ambulatory Visit: Payer: Self-pay

## 2021-04-24 ENCOUNTER — Encounter: Payer: Self-pay | Admitting: Internal Medicine

## 2021-04-24 ENCOUNTER — Ambulatory Visit (INDEPENDENT_AMBULATORY_CARE_PROVIDER_SITE_OTHER): Payer: Medicare Other | Admitting: Internal Medicine

## 2021-04-24 VITALS — BP 114/70 | HR 83 | Temp 98.6°F | Ht 68.0 in | Wt 216.0 lb

## 2021-04-24 DIAGNOSIS — H6123 Impacted cerumen, bilateral: Secondary | ICD-10-CM

## 2021-04-24 DIAGNOSIS — R739 Hyperglycemia, unspecified: Secondary | ICD-10-CM | POA: Diagnosis not present

## 2021-04-24 DIAGNOSIS — B009 Herpesviral infection, unspecified: Secondary | ICD-10-CM | POA: Diagnosis not present

## 2021-04-24 DIAGNOSIS — R519 Headache, unspecified: Secondary | ICD-10-CM

## 2021-04-24 MED ORDER — TRAMADOL HCL 50 MG PO TABS: 50.0000 mg | ORAL_TABLET | Freq: Three times a day (TID) | ORAL | 0 refills | Status: DC | PRN

## 2021-04-24 MED ORDER — VALACYCLOVIR HCL 1 G PO TABS: ORAL_TABLET | ORAL | 1 refills | Status: DC

## 2021-04-24 NOTE — Patient Instructions (Signed)
Please take all new medication as prescribed  - the tramadol as needed  Please continue all other medications as before, includign the valtrex for the rash  Your ears were irrigated of wax today  Please have the pharmacy call with any other refills you may need.  Please continue your efforts at being more active, low cholesterol diet, and weight control.  Please keep your appointments with your specialists as you may have planned

## 2021-04-24 NOTE — Progress Notes (Signed)
Patient consent obtained. Irrigation with water and peroxide performed. Full view of tympanic membranes after procedure.  Patient tolerated procedure well.   

## 2021-04-24 NOTE — Progress Notes (Signed)
Patient ID: Anthony [name], male   DOB: 1965-11-24, 55 y.o.   MRN: [id number]        Chief Complaint: follow up HA, herpes outbreak, bilateral hearing loss       HPI:  Anthony [name] is a 55 y.o. male here with c/o near daily moderate non throbbing HA to forehead bilateral x 3 wks, without new focal neuro s/s, vision change or overt sinus symptoms such as pain or congestion.   Pt denies fever, wt loss, night sweats, loss of appetite, or other constitutional symptoms  Pt denies chest pain, increased sob or doe, wheezing, orthopnea, PND, increased LE swelling, palpitations, dizziness or syncope.   Pt denies polydipsia, polyuria.   Does have a recurring rash c/w genital herpes to groin and buttock, has valtrex at home and asking if needs to take this  Also has bilateral hearing reduced in the past wk, without other HA, ear pain or d/c. Vertigo or tinnitus       Wt Readings from Last 3 Encounters:  04/24/21 216 lb (98 kg)  01/28/21 214 lb (97.1 kg)  01/12/21 209 lb (94.8 kg)   BP Readings from Last 3 Encounters:  04/24/21 114/70  01/28/21 118/80  01/22/21 (!) 148/87         Past Medical History:  Diagnosis Date   Allergy    Past hx    Anemia    Anxiety    Asthma    Cataract    removed left- forming on the right  DVT and PE from long travel trip    Chronic kidney disease 2006   kidney stones    Clotting disorder ([place]) 2016   PE - small    Complication of anesthesia    HARD TO WAKE UP AFTER 2ND EYE SURGERY   COVID-19 virus infection    Depression    DVT (deep venous thrombosis) ([place])    DUE TO A LONG TRAVEL   Fatty liver    Genital herpes 05/06/2018   H/O renal calculi 2006   Headache    MIGRAINES   History of kidney stones    H/O   History of methicillin resistant staphylococcus aureus (MRSA)    Hyperlipidemia    Internal hemorrhoid    PE (pulmonary embolism)    Peptic ulcer disease    Pneumonia    h/o   PTSD (post-traumatic stress disorder)    Sleep apnea    DOES NOT USE  CPAP CURRENTLY-MASK DOES NOT FIT CORRECTLY SO [facility] (02-13-20)   Spondylosis    Tubular adenoma of colon 2011   Dr Olevia Perches   Past Surgical History:  Procedure Laterality Date   CATARACT EXTRACTION Left 1996   Dr Elonda Husky; OS   COLONOSCOPY     colonoscopy with polypectomy  2011   Dr Olevia Perches, hemorrhoids   CYSTOSCOPY W/ STONE MANIPULATION  2006   EYE SURGERY Left    INGUINAL HERNIA REPAIR Right 1990   POLYPECTOMY     SHOULDER SURGERY Right 12/17/2010   Dr French Ana ; shoulder impingement & torn tendon   TRANSURETHRAL RESECTION OF PROSTATE N/A 02/23/2020   Procedure: TRANSURETHRAL RESECTION OF THE PROSTATE (TURP);  Surgeon: Billey Co, MD;  Location: [facility] ORS;  Service: Urology;  Laterality: N/A;    reports that he has never smoked. He has never used smokeless tobacco. He reports current alcohol use. He reports that he does not use drugs. family history includes Cancer in his  paternal grandmother; Diabetes in his father; Esophageal cancer in his maternal uncle; Glaucoma in his father; Heart attack in his maternal uncle; Heart attack (age of onset: 12) in his mother; Heart attack (age of onset: 92) in his maternal grandmother; Stroke (age of onset: 35) in his father. Allergies  Allergen Reactions   Aleve [Naproxen] Rash    Patient stated it was years ago   Nsaids Other (See Comments)    Bleeding ulcers   Current Outpatient Medications on File Prior to Visit  Medication Sig Dispense Refill   acetaminophen (TYLENOL) 500 MG tablet Take 1,000 mg by mouth every 6 (six) hours as needed for moderate pain.     albuterol (PROVENTIL HFA;VENTOLIN HFA) 108 (90 Base) MCG/ACT inhaler Inhale 2 puffs into the lungs every 6 (six) hours as needed. For shortness of breath 1 Inhaler 1   aspirin EC 81 MG tablet Take 81 mg by mouth daily.     atorvastatin (LIPITOR) 10 MG tablet Take 1 tablet (10 mg total) by mouth daily. 90 tablet 3   Brinzolamide-Brimonidine 1-0.2 % SUSP Place 1  drop into the left eye 2 (two) times daily.      carboxymethylcellulose (REFRESH PLUS) 0.5 % SOLN Apply to eye.     Cholecalciferol (VITAMIN D3 PO) Take 1 tablet by mouth daily.     clotrimazole-betamethasone (LOTRISONE) cream Use as directed twice per day as needed to the affected area 45 g 1   cyclobenzaprine (FLEXERIL) 5 MG tablet Take 1 tablet (5 mg total) by mouth 3 (three) times daily as needed for muscle spasms. 15 tablet 0   fluticasone (FLONASE) 50 MCG/ACT nasal spray Place into the nose.     ketoconazole (NIZORAL) 2 % cream Apply topically.     ketotifen (ZADITOR) 0.025 % ophthalmic solution Apply to eye.     Multiple Vitamin (MULTIVITAMIN) tablet Take 1 tablet by mouth daily.     OXcarbazepine (TRILEPTAL) 150 MG tablet Take 150 mg by mouth as needed (FOR ANXIETY/PTSD).     PARoxetine (PAXIL) 40 MG tablet TAKE ONE-HALF TABLET BY MOUTH DAILY FOR MENTAL HEALTH     phenazopyridine (PYRIDIUM) 100 MG tablet Take 200 mg by mouth 3 (three) times daily.     sildenafil (VIAGRA) 100 MG tablet TAKE ONE TABLET BY MOUTH AS INSTRUCTED (TAKE 1 HOUR PRIOR TO SEXUAL ACTIVITY *DO NOT EXCEED 1 DOSE PER 24 HOUR PERIOD*)     triamcinolone cream (KENALOG) 0.1 % Apply topically.     valACYclovir (VALTREX) 1000 MG tablet Take 1/2 tablet by mouth twice daily for 3 days or 1 tablet by mouth every day for 5 days at onset of breakout. 90 tablet 1   [DISCONTINUED] omeprazole (PRILOSEC) 20 MG capsule Take 1 capsule (20 mg total) by mouth daily. 30 capsule 0   [DISCONTINUED] topiramate (TOPAMAX) 25 MG tablet topiramate 25 mg tablet (Patient not taking: No sig reported)     No current facility-administered medications on file prior to visit.        ROS:  All others reviewed and negative.  Objective        PE:  BP 114/70 (BP Location: Right Arm, Patient Position: Sitting, Cuff Size: Large)    Pulse 83    Temp 98.6 F (37 C) (Oral)    Ht 5\' 8"  (1.727 m)    Wt 216 lb (98 kg)    SpO2 96%    BMI 32.84 kg/m  Constitutional: Pt appears in NAD               HENT: Head: NCAT.                Right Ear: External ear normal.                 Left Ear: External ear normal.                Eyes: . Pupils are equal, round, and reactive to light. Conjunctivae and EOM are normal               Nose: without d/c or deformity               Neck: Neck supple. Gross normal ROM               Cardiovascular: Normal rate and regular rhythm.                 Pulmonary/Chest: Effort normal and breath sounds without rales or wheezing.                Abd:  Soft, NT, ND, + BS, no organomegaly               Neurological: Pt is alert. At baseline orientation, motor grossly intact               Skin: Skin is warm.  LE edema - none, with typical herpetic rash to right groin and buttock               Psychiatric: Pt behavior is normal without agitation   Micro: none  Cardiac tracings I have personally interpreted today:  none  Pertinent Radiological findings (summarize): none   Lab Results  Component Value Date   WBC 4.7 06/25/2020   HGB 14.7 06/25/2020   HCT 43.7 06/25/2020   PLT 250.0 06/25/2020   GLUCOSE 92 06/25/2020   CHOL 199 06/25/2020   TRIG 101.0 06/25/2020   HDL 51.50 06/25/2020   LDLDIRECT 89.0 04/20/2019   LDLCALC 127 (H) 06/25/2020   ALT 20 06/25/2020   AST 16 06/25/2020   NA 137 06/25/2020   K 4.3 06/25/2020   CL 103 06/25/2020   CREATININE 0.95 06/25/2020   BUN 10 06/25/2020   CO2 27 06/25/2020   TSH 0.70 06/25/2020   PSA 0.66 06/25/2020   HGBA1C 5.9 06/25/2020   Assessment/Plan:  Anthony [name] is a 55 y.o. Black or African American [2] male with  has a past medical history of Allergy, Anemia, Anxiety, Asthma, Cataract, Chronic kidney disease (2006), Clotting disorder ([place]) (3532), Complication of anesthesia, COVID-19 virus infection, Depression, DVT (deep venous thrombosis) ([place]), Fatty liver, Genital herpes (05/06/2018), H/O renal calculi (2006), Headache, History of kidney stones,  History of methicillin resistant staphylococcus aureus (MRSA), Hyperlipidemia, Internal hemorrhoid, PE (pulmonary embolism), Peptic ulcer disease, Pneumonia, PTSD (post-traumatic stress disorder), Sleep apnea, Spondylosis, and Tubular adenoma of colon (2011).  Bilateral impacted cerumen Ceruminosis is noted.  Wax is removed by syringing and manual debridement. Instructions for home care to prevent wax buildup are given.  Hearing improved after bilateral wax impaction resolved with irrigation  Herpes simplex D/w pt, ok for valtrex as prescribed and has at home, declines change to daily suppressive tx for now  Headache, unspecified Mild to mod, atypical and nonspecific, no neuro change noted, NSAID allergic, for tramadol prn limited rx,  to f/u any worsening symptoms or concerns  Hyperglycemia Lab Results  Component  Value Date   HGBA1C 5.9 06/25/2020   Stable, pt to continue current medical treatment  - diet  Followup: Return if symptoms worsen or fail to improve.  Cathlean Cower, MD 04/26/2021 6:17 AM [facility]

## 2021-04-26 ENCOUNTER — Encounter: Payer: Self-pay | Admitting: Internal Medicine

## 2021-04-26 DIAGNOSIS — H6123 Impacted cerumen, bilateral: Secondary | ICD-10-CM | POA: Insufficient documentation

## 2021-04-26 NOTE — Assessment & Plan Note (Signed)
Lab Results  Component Value Date   HGBA1C 5.9 06/25/2020   Stable, pt to continue current medical treatment  - diet

## 2021-04-26 NOTE — Assessment & Plan Note (Signed)
Ceruminosis is noted.  Wax is removed by syringing and manual debridement. Instructions for home care to prevent wax buildup are given.  Hearing improved after bilateral wax impaction resolved with irrigation

## 2021-04-26 NOTE — Assessment & Plan Note (Signed)
Mild to mod, atypical and nonspecific, no neuro change noted, NSAID allergic, for tramadol prn limited rx,  to f/u any worsening symptoms or concerns

## 2021-04-26 NOTE — Assessment & Plan Note (Signed)
D/w pt, ok for valtrex as prescribed and has at home, declines change to daily suppressive tx for now

## 2021-04-30 ENCOUNTER — Encounter: Payer: Self-pay | Admitting: Internal Medicine

## 2021-04-30 DIAGNOSIS — R519 Headache, unspecified: Secondary | ICD-10-CM

## 2021-05-01 NOTE — Addendum Note (Signed)
Addended by: Biagio Borg on: 05/01/2021 07:59 PM   Modules accepted: Orders

## 2021-05-02 ENCOUNTER — Encounter: Payer: Self-pay | Admitting: Neurology

## 2021-05-07 ENCOUNTER — Encounter: Payer: Self-pay | Admitting: Internal Medicine

## 2021-05-08 NOTE — Telephone Encounter (Signed)
Contact patient to schedule an office visit in regards to needing STI testing and possible treatment.

## 2021-05-09 ENCOUNTER — Other Ambulatory Visit: Payer: Self-pay

## 2021-05-09 ENCOUNTER — Ambulatory Visit (INDEPENDENT_AMBULATORY_CARE_PROVIDER_SITE_OTHER): Payer: Medicare Other | Admitting: Nurse Practitioner

## 2021-05-09 VITALS — BP 124/82 | HR 100 | Temp 98.1°F | Ht 68.0 in | Wt 216.0 lb

## 2021-05-09 DIAGNOSIS — Z202 Contact with and (suspected) exposure to infections with a predominantly sexual mode of transmission: Secondary | ICD-10-CM | POA: Diagnosis not present

## 2021-05-09 DIAGNOSIS — H527 Unspecified disorder of refraction: Secondary | ICD-10-CM | POA: Insufficient documentation

## 2021-05-09 DIAGNOSIS — H40053 Ocular hypertension, bilateral: Secondary | ICD-10-CM | POA: Insufficient documentation

## 2021-05-09 MED ORDER — METRONIDAZOLE 500 MG PO TABS: 2000.0000 mg | ORAL_TABLET | Freq: Once | ORAL | 0 refills | Status: AC

## 2021-05-09 NOTE — Progress Notes (Signed)
Subjective:  Patient ID: Anthony [name], male    DOB: 1965-07-10  Age: 56 y.o. MRN: [id number]  CC:  Chief Complaint  Patient presents with   Exposure to STD    Girlfriend was test for trichomonas and was positive      HPI  This patient arrives today for the above.  He tells me his sexual partner was recently diagnosed trichomonas would like to be evaluated for this himself.  He denies any symptoms such as dysuria, penile drainage, or fever.   Past Medical History:  Diagnosis Date   Allergy    Past hx    Anemia    Anxiety    Asthma    Cataract    removed left- forming on the right  DVT and PE from long travel trip    Chronic kidney disease 2006   kidney stones    Clotting disorder ([place]) 2016   PE - small    Complication of anesthesia    HARD TO WAKE UP AFTER 2ND EYE SURGERY   COVID-19 virus infection    Depression    DVT (deep venous thrombosis) (HCC)    DUE TO A LONG TRAVEL   Fatty liver    Genital herpes 05/06/2018   H/O renal calculi 2006   Headache    MIGRAINES   History of kidney stones    H/O   History of methicillin resistant staphylococcus aureus (MRSA)    Hyperlipidemia    Internal hemorrhoid    PE (pulmonary embolism)    Peptic ulcer disease    Pneumonia    h/o   PTSD (post-traumatic stress disorder)    Sleep apnea    DOES NOT USE CPAP CURRENTLY-MASK DOES NOT [facility]   Spondylosis    Tubular adenoma of colon 2011   Dr Olevia Perches      Family History  Problem Relation Age of Onset   Heart attack Mother 49   Stroke Father 45   Diabetes Father        PVD   Glaucoma Father        blindness   Cancer Paternal Grandmother        ? primary   Heart attack Maternal Uncle         X2,pre 55   Esophageal cancer Maternal Uncle    Heart attack Maternal Grandmother 73   Asthma Neg Hx    COPD Neg Hx    Colon cancer Neg Hx    Colon polyps Neg Hx    Stomach cancer Neg Hx    Rectal cancer Neg Hx      Social History   Social History Narrative   Fun: [employer]   Social History   Tobacco Use   Smoking status: Never   Smokeless tobacco: Never  Substance Use Topics   Alcohol use: Yes    Alcohol/week: 0.0 standard drinks    Comment: Socially , < 2X/ month     Current Meds  Medication Sig   acetaminophen (TYLENOL) 500 MG tablet Take 1,000 mg by mouth every 6 (six) hours as needed for moderate pain.   albuterol (PROVENTIL HFA;VENTOLIN HFA) 108 (90 Base) MCG/ACT inhaler Inhale 2 puffs into the lungs every 6 (six) hours as needed. For shortness of breath   aspirin EC 81 MG tablet Take 81 mg by mouth daily.   atorvastatin (LIPITOR) 10 MG tablet Take 1 tablet (10 mg total) by mouth  daily.   Brinzolamide-Brimonidine 1-0.2 % SUSP Place 1 drop into the left eye 2 (two) times daily.    carboxymethylcellulose (REFRESH PLUS) 0.5 % SOLN Apply to eye.   Cholecalciferol (VITAMIN D3 PO) Take 1 tablet by mouth daily.   clotrimazole-betamethasone (LOTRISONE) cream Use as directed twice per day as needed to the affected area   cyclobenzaprine (FLEXERIL) 5 MG tablet Take 1 tablet (5 mg total) by mouth 3 (three) times daily as needed for muscle spasms.   fluticasone (FLONASE) 50 MCG/ACT nasal spray Place into the nose.   ketoconazole (NIZORAL) 2 % cream Apply topically.   ketotifen (ZADITOR) 0.025 % ophthalmic solution Apply to eye.   metroNIDAZOLE (FLAGYL) 500 MG tablet Take 4 tablets (2,000 mg total) by mouth once for 1 dose.   Multiple Vitamin (MULTIVITAMIN) tablet Take 1 tablet by mouth daily.   OXcarbazepine (TRILEPTAL) 150 MG tablet Take 150 mg by mouth as needed (FOR ANXIETY/PTSD).   PARoxetine (PAXIL) 40 MG tablet TAKE ONE-HALF TABLET BY MOUTH DAILY FOR MENTAL HEALTH   phenazopyridine (PYRIDIUM) 100 MG tablet Take 200 mg by mouth 3 (three) times daily.   sildenafil (VIAGRA) 100 MG tablet TAKE ONE TABLET BY MOUTH AS INSTRUCTED (TAKE 1 HOUR PRIOR TO SEXUAL ACTIVITY *DO NOT EXCEED 1 DOSE PER 24  HOUR PERIOD*)   traMADol (ULTRAM) 50 MG tablet Take 1 tablet (50 mg total) by mouth every 8 (eight) hours as needed.   triamcinolone cream (KENALOG) 0.1 % Apply topically.   Ubrogepant 100 MG TABS TAKE ONE TABLET BY MOUTH DAILY AS NEEDED - NEW MEDICINE FOR MIGRAINE RELIEF   valACYclovir (VALTREX) 1000 MG tablet Take 1/2 tablet by mouth twice daily for 3 days or 1 tablet by mouth every day for 5 days at onset of breakout.    ROS:  Review of Systems  Constitutional:  Negative for chills and fever.  Respiratory:  Negative for shortness of breath.   Cardiovascular:  Negative for chest pain.  Genitourinary:  Negative for dysuria, hematuria and urgency.  Neurological:  Positive for headaches.    Objective:   Today's Vitals: BP 124/82 (BP Location: Left Arm, Patient Position: Sitting, Cuff Size: Normal)    Pulse 100    Temp 98.1 F (36.7 C) (Oral)    Ht 5\' 8"  (1.727 m)    Wt 216 lb (98 kg)    SpO2 97%    BMI 32.84 kg/m  Vitals with BMI 05/09/2021 04/24/2021 01/28/2021  Height 5\' 8"  5\' 8"  5\' 8"   Weight 216 lbs 216 lbs 214 lbs  BMI 32.85 36.62 94.76  Systolic 546 503 546  Diastolic 82 70 80  Pulse 568 83 96     Physical Exam Vitals reviewed.  Constitutional:      Appearance: Normal appearance.  HENT:     Head: Normocephalic and atraumatic.  Cardiovascular:     Rate and Rhythm: Normal rate and regular rhythm.  Pulmonary:     Effort: Pulmonary effort is normal.     Breath sounds: Normal breath sounds.  Musculoskeletal:     Cervical back: Neck supple.  Skin:    General: Skin is warm and dry.  Neurological:     Mental Status: He is alert and oriented to person, place, and time.  Psychiatric:        Mood and Affect: Mood normal.        Behavior: Behavior normal.        Thought Content: Thought content normal.  Judgment: Judgment normal.         Assessment and Plan   1. STD exposure      Plan: 1.  We will treat with one-time dose of 2 g of metronidazole.  We will  also test urine for trichomonas, chlamydia, gonorrhea, and mycoplasma.  We will do blood test to test for HIV and syphilis.  He was told to abstain from sex for the next 7 days as well as to avoid alcohol intake.  He tells me he understands.   Tests ordered Orders Placed This Encounter  Procedures   Sexually-Transmitted Infections (STIs) Increased Risk Panel   HIV Antibody (routine testing w rflx)   RPR      Meds ordered this encounter  Medications   metroNIDAZOLE (FLAGYL) 500 MG tablet    Sig: Take 4 tablets (2,000 mg total) by mouth once for 1 dose.    Dispense:  4 tablet    Refill:  0    Order Specific Question:   Supervising Provider    Answer:   Binnie Rail [[id number]]    Patient to follow-up with PCP for annual physical exam, or call his office for another acute visit as needed.  Ailene Ards, NP

## 2021-05-13 LAB — HIV ANTIBODY (ROUTINE TESTING W REFLEX): HIV 1&2 Ab, 4th Generation: NONREACTIVE

## 2021-05-13 LAB — SEXUALLY-TRANSMITTED INFECTIONS (STIS) INCREASED RISK PANEL
CHLAMYDIA TRACHOMATIS RNA, TMA, UROGENITAL: NOT DETECTED
MYCOPLASMA GENITALIUM, rRNA, TMA: NOT DETECTED
NEISSERIA GONORRHOEAE RNA, TMA, UROGENITAL: NOT DETECTED
TRICHOMONAS VAGINALIS RNA, QL TMA: NOT DETECTED

## 2021-05-13 LAB — RPR: RPR Ser Ql: NONREACTIVE

## 2021-05-14 ENCOUNTER — Encounter: Payer: Self-pay | Admitting: Internal Medicine

## 2021-05-15 MED ORDER — NIRMATRELVIR/RITONAVIR (PAXLOVID)TABLET: 3.0000 | ORAL_TABLET | Freq: Two times a day (BID) | ORAL | 0 refills | Status: DC

## 2021-05-15 MED ORDER — NIRMATRELVIR/RITONAVIR (PAXLOVID)TABLET: 3.0000 | ORAL_TABLET | Freq: Two times a day (BID) | ORAL | 0 refills | Status: AC

## 2021-05-15 NOTE — Progress Notes (Deleted)
HPI male never smoker, Anim[employer], followed for allergic rhinitis, asthma, complicated by history PE/DVT 2016, peptic ulcer, depression ([facility]), glaucoma, OSA ([facility]) Allergy Profile 09/25/2011-total IgE 159.6 with multiple specific elevations especially for grass pollens, molds, tree pollens and ragweed. PFT: 10/08/2011-mild obstructive airways disease-small airways, insignificant response to bronchodilator Hypercoag panel 04/26/15- incr Protein C,/ managed by Elna Breslow, [place] He brings [facility] records indicating wheezing and bronchospasm "probably secondary to pneumonia" in 1988 when he was in service in [place]. He was diagnosed with asthma and bronchopneumonia at that time. He says he had no asthma before that pneumonia and he denies family history of asthma. He requests a letter supporting his argument that asthma developed as a consequence of pneumonia which occurred while he was in [employer] service.  ---------------------------------------------------------------------------------------   05/16/20- 56 year old male never smoker, [employer],  followed for allergic Rhinitis, Asthma, complicated by history PE/DVT 2016, peptic ulcer, depression ([facility]), Glaucoma, OSA ([facility]), Covid infection jan, 2022,  Symbicort 160, albuterol HFA, Flonase Covid vax- 2 Moderna Flu vax- declines Had quarantined after Covid positive on Jan 3 , following visit to grandson + in [place]. Minor cough scant clear phlegm. Continues Symbicort daily, using rescue hfa 1x/ month I recommended Covid booster at least 4-6 weeks after infection. CXR 05/17/19- IMPRESSION: Normal chest radiographs.  05/16/21-  56 year old male never smoker, [employer],  followed for allergic Rhinitis, Asthma, complicated by history PE/DVT 2016, peptic ulcer, depression ([facility]), Glaucoma, OSA ([facility]), Covid infection jan, 2022,  Symbicort 160, albuterol HFA, Flonase Covid vax- 2 Moderna Flu vax-  New paxlovid from Dr Jenny Reichmann  05/15/21 for acute Covid infection   ROS-see HPI + = positive Constitutional:   No-   weight loss, night sweats, fevers, chills, fatigue, lassitude. HEENT:   No-  headaches, difficulty swallowing, tooth/dental problems, sore throat,    no-sneezing, no-itching, ear ache, no- nasal congestion, post nasal drip,  CV:  +chest pain, No- orthopnea, PND, swelling in lower extremities, anasarca, dizziness, palpitations Resp: +shortness of breath with exertion or at rest.              No-  productive cough,  No non-productive cough,  No- coughing up of blood.                change in color of mucus.  No- wheezing.   Skin:  GI:  No-   heartburn, indigestion, abdominal pain, nausea, vomiting, GU:, MS:  No-   joint pain or swelling.   Neuro-     nothing unusual Psych:  No- change in mood or affect. No depression or anxiety.  No memory loss.  OBJ- Physical Exam General- Alert, Oriented, Affect-appropriate, Distress- none acute Skin- + mild hyperkeratosis on anterior chest Lymphadenopathy- none Head- atraumatic            Eyes- Gross vision intact, PERRLA, conjunctivae and secretions clear            Ears- Hearing, canals-normal            Nose-no-sniffing, no-Septal dev, polyps, erosion, perforation             Throat- Mallampati III , mucosa clear , drainage- none, tonsils- atrophic Neck- flexible , trachea midline, no stridor , thyroid nl, carotid no bruit Chest - symmetrical excursion , unlabored           Heart/CV- RRR , no murmur , no gallop  , no rub, nl s1 s2                           -  JVD- none , edema- none, stasis changes- none, varices- none           Lung- clear to P&A, wheeze-none, cough- none , dullness-none, rub- none           Chest wall-  Abd-  Br/ Gen/ Rectal- Not done, not indicated Extrem- cyanosis- none, clubbing, none, atrophy- none, strength- nl, negative Homan's Neuro- grossly intact to observation

## 2021-05-16 ENCOUNTER — Ambulatory Visit: Payer: Federal, State, Local not specified - PPO | Admitting: Internal Medicine
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
## 2021-05-17 ENCOUNTER — Encounter: Payer: Self-pay | Admitting: Internal Medicine

## 2021-05-19 ENCOUNTER — Ambulatory Visit (INDEPENDENT_AMBULATORY_CARE_PROVIDER_SITE_OTHER): Payer: Medicare Other | Admitting: Neurology

## 2021-05-19 ENCOUNTER — Encounter: Payer: Self-pay | Admitting: Neurology

## 2021-05-19 ENCOUNTER — Other Ambulatory Visit: Payer: Self-pay

## 2021-05-19 VITALS — BP 111/76 | HR 99 | Ht 67.0 in | Wt 217.0 lb

## 2021-05-19 DIAGNOSIS — G44229 Chronic tension-type headache, not intractable: Secondary | ICD-10-CM

## 2021-05-19 DIAGNOSIS — G43009 Migraine without aura, not intractable, without status migrainosus: Secondary | ICD-10-CM

## 2021-05-19 MED ORDER — NORTRIPTYLINE HCL 25 MG PO CAPS: 25.0000 mg | ORAL_CAPSULE | Freq: Every day | ORAL | 5 refills | Status: DC

## 2021-05-19 NOTE — Patient Instructions (Signed)
°  Start nortriptyline 25mg  at bedtime.  Contact us in 6 weeks with update and we can increase dose if needed. May treat headaches with the cyclobenzaprine as needed.  Stop tramadol.  Limit use of pain relievers to no more than 2 days out of the week.  These medications include acetaminophen, NSAIDs (ibuprofen/Advil/Motrin, naproxen/Aleve, triptans (Imitrex/sumatriptan), Excedrin, and narcotics.  This will help reduce risk of rebound headaches. Be aware of common food triggers:  - Caffeine:  coffee, black tea, cola, Mt. Dew  - Chocolate  - Dairy:  aged cheeses (brie, blue, cheddar, gouda, [place], provolone, [place], Swiss, etc), chocolate milk, buttermilk, sour cream, limit eggs and yogurt  - Nuts, peanut butter  - Alcohol  - Cereals/grains:  FRESH breads (fresh bagels, sourdough, doughnuts), yeast productions  - Processed/canned/aged/cured meats (pre-packaged deli meats, hotdogs)  - MSG/glutamate:  soy sauce, flavor enhancer, pickled/preserved/marinated foods  - Sweeteners:  aspartame (Equal, Nutrasweet).  Sugar and Splenda are okay  - Vegetables:  legumes (lima beans, lentils, snow peas, fava beans, pinto peans, peas, garbanzo beans), sauerkraut, onions, olives, pickles  - Fruit:  avocados, bananas, citrus fruit (orange, lemon, grapefruit), mango  - Other:  Frozen meals, macaroni and cheese Routine exercise Stay adequately hydrated (aim for 64 oz water daily) Keep headache diary Maintain proper stress management Maintain proper sleep hygiene Do not skip meals Consider supplements:  magnesium citrate 400mg  daily, riboflavin 400mg  daily, coenzyme Q10 100mg  three times daily.

## 2021-05-21 ENCOUNTER — Ambulatory Visit
Admission: EM | Admit: 2021-05-21 | Discharge: 2021-05-21 | Disposition: A | Discharge status: Home / Self Care | Payer: Medicare Other | Attending: Emergency Medicine | Admitting: Emergency Medicine

## 2021-05-21 ENCOUNTER — Other Ambulatory Visit: Payer: Self-pay

## 2021-05-21 ENCOUNTER — Encounter: Payer: Self-pay | Admitting: Internal Medicine

## 2021-05-21 DIAGNOSIS — R300 Dysuria: Secondary | ICD-10-CM | POA: Diagnosis present

## 2021-05-21 LAB — URINALYSIS, COMPLETE (UACMP) WITH MICROSCOPIC
Bacteria, UA: NONE SEEN
Bilirubin Urine: NEGATIVE
Glucose, UA: NEGATIVE mg/dL
Ketones, ur: NEGATIVE mg/dL
Leukocytes,Ua: NEGATIVE
Nitrite: NEGATIVE
Protein, ur: NEGATIVE mg/dL
Specific Gravity, Urine: 1.01 (ref 1.005–1.030)
pH: 5.5 (ref 5.0–8.0)

## 2021-05-21 MED ORDER — METRONIDAZOLE 500 MG PO TABS: 2000.0000 mg | ORAL_TABLET | Freq: Once | ORAL | 0 refills | Status: AC

## 2021-05-21 NOTE — ED Triage Notes (Signed)
Patient is here for "stinging after urination". History of prostatitis. Seen "recently" for exposure to STI (trich). No discharge. No pain in scrotum. No injury known.

## 2021-05-21 NOTE — Discharge Instructions (Signed)
Take the Metronidazole as a single dose

## 2021-05-21 NOTE — ED Provider Notes (Signed)
[facility]    CSN: [id number] Arrival date & time: 05/21/21  1549      History   Chief Complaint Chief Complaint  Patient presents with   UTI Symptoms    HPI Anthony [name] is a 56 y.o. male.   HPI  56 year old male here for evaluation of pain in eburnation.  Patient reports that he has been experiencing stinging at the end of urination and is not associated with urinary urgency or frequency.  He also denies any penile discharge, back pain, or changes in his urine stream.  He was recently tested for trichomoniasis and empirically treated with 2 g of Flagyl.  His STI testing was negative.  His girlfriend did test positive for trichomoniasis and he had unprotected sexual intercourse before she completed antibiotic therapy just prior to the patient developing dysuria.  Past Medical History:  Diagnosis Date   Allergy    Past hx    Anemia    Anxiety    Asthma    Cataract    removed left- forming on the right  DVT and PE from long travel trip    Chronic kidney disease 2006   kidney stones    Clotting disorder ([place]) 2016   PE - small    Complication of anesthesia    HARD TO WAKE UP AFTER 2ND EYE SURGERY   COVID-19 virus infection    Depression    DVT (deep venous thrombosis) (HCC)    DUE TO A LONG TRAVEL   Fatty liver    Genital herpes 05/06/2018   H/O renal calculi 2006   Headache    MIGRAINES   History of kidney stones    H/O   History of methicillin resistant staphylococcus aureus (MRSA)    Hyperlipidemia    Internal hemorrhoid    PE (pulmonary embolism)    Peptic ulcer disease    Pneumonia    h/o   PTSD (post-traumatic stress disorder)    Sleep apnea    DOES NOT USE CPAP CURRENTLY-MASK DOES NOT [facility] CORRECTLY [facility]   Spondylosis    Tubular adenoma of colon 2011   Dr Olevia Perches    Patient Active Problem List   Diagnosis Date Noted   Ocular hypertension, 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   Unspecified disorder of  refraction 05/09/2021   Bilateral impacted cerumen 04/26/2021   Cellulitis of left elbow 11/19/2020   Allergic rhinitis 10/15/2020   Brow ptosis, left 10/15/2020   Bunion of right foot 10/15/2020   Contact with and (suspected) exposure to covid-19 10/15/2020   COVID-19 10/15/2020   Deposits (accretions) on teeth 10/15/2020   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, left eye, stage unspecified 10/15/2020   Hematuria, unspecified 10/15/2020   Inflammatory disease of prostate, unspecified 10/15/2020   Male erectile disorder (CODE) 10/15/2020   Male erectile dysfunction, unspecified 10/15/2020   Nondependent alcohol abuse, continuous drinking behavior 10/15/2020   Obesity 10/15/2020   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   Unspecified ptosis of left eyelid 10/15/2020   Unspecified symptoms and signs involving cognitive functions and awareness 10/15/2020   Other recurrent depressive disorders ([place]) 10/15/2020   Post-traumatic stress disorder, unspecified 10/15/2020   Adjustment disorder, unspecified 10/15/2020   Encounter for dental examination and cleaning without abnormal findings 10/15/2020   Recurrent major depression ([place]) 10/15/2020   Major depressive disorder, recurrent, unspecified ([place]) 10/15/2020   Mood disorder ([place]) 10/15/2020   Dietary counseling and surveillance 10/15/2020   Other specified counseling 10/15/2020  Headache, unspecified 10/15/2020   Herpes simplex 10/15/2020   Migraine with aura, intractable, with status migrainosus 10/15/2020   Migraine without aura, not intractable, with status migrainosus 10/15/2020   Low back pain, unspecified 10/15/2020   Pain in left leg 10/15/2020   Obstructive sleep apnea (adult) (pediatric) 10/15/2020   Other asthma 10/15/2020   Meralgia paresthetica of left side 07/03/2020   Major depressive disorder, single episode, unspecified 06/25/2020   Leg cramping 09/04/2019   Pain in joint of right elbow 08/03/2019   Lateral epicondylitis of right  elbow 08/03/2019   Nephrolithiasis 05/11/2019   Epigastric pain 05/11/2019   Other constipation 05/11/2019   Right sided abdominal pain 05/11/2019   Paresthesia 04/20/2019   Chronic post-traumatic stress disorder 01/14/2019   Left ear hearing loss 01/14/2019   Low back pain 01/14/2019   Intractable migraine with aura without status migrainosus 01/07/2019   Genital herpes 05/06/2018   STD exposure 05/06/2018   Obstructive sleep apnea 02/13/2018   Hyperglycemia 01/24/2018   Pelvic pain 07/10/2017   Exposure to blood or body fluid 07/07/2017   Trigger thumb of right hand 05/21/2017   Trigger finger, right middle finger 03/18/2017   Anxiety 03/18/2017   Chronic tension-type headache, not intractable 02/12/2017   Herpes simplex type II infection 12/02/2016   Dysuria 12/02/2016   Pain of right thumb 11/13/2016   Other chest pain 07/27/2016   Onychomycosis 06/12/2016   Encounter for well adult exam with abnormal findings 06/12/2016   Generalized headache 05/15/2016   Lumbar paraspinal muscle spasm 05/15/2016   History of DVT (deep vein thrombosis) 11/18/2015   Elevated PSA 11/18/2015   Acute pulmonary embolism ([place]) 2016 05/09/2015   Peptic ulcer 05/09/2015   Dyspnea 05/02/2015   DVT (deep venous thrombosis) ([place]) 04/26/2015   Duodenal ulcer 04/25/2015   Positive D dimer 04/23/2015   Internal hemorrhoid, bleeding 04/04/2015   Acute gastrointestinal hemorrhage 04/03/2015   Duodenitis 04/03/2015   Postural dizziness with presyncope 04/03/2015   Microscopic hematuria 04/03/2015   Dislocated IOL (intraocular lens), initial encounter 12/10/2014   MRSA infection 09/14/2013   History of methicillin resistant Staphylococcus aureus infection 02/17/2013   Spondylosis of lumbar joint 09/28/2012   Fatty liver disease, nonalcoholic 98/92/1194   Lumbosacral spondylosis without myelopathy 09/28/2012   Fatty metamorphosis of liver 09/28/2012   Atopic dermatitis, unspecified 05/13/2012    Seasonal and perennial allergic rhinitis 10/01/2011   Family history of ischemic heart disease 04/02/2011   Low serum testosterone level 03/12/2011   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/12/2011   COLONIC POLYPS, HX OF 06/21/2009   Hyperlipidemia 02/16/2008   Asthma, mild persistent 02/16/2008   NONSPECIFIC ABNORMAL ELECTROCARDIOGRAM 02/16/2008   NEPHROLITHIASIS, HX OF 02/16/2008    Past Surgical History:  Procedure Laterality Date   CATARACT EXTRACTION Left 1996   Dr Elonda Husky; OS   COLONOSCOPY     colonoscopy with polypectomy  2011   Dr Olevia Perches, hemorrhoids   CYSTOSCOPY W/ STONE MANIPULATION  2006   EYE SURGERY Left    INGUINAL HERNIA REPAIR Right 1990   POLYPECTOMY     SHOULDER SURGERY Right 12/17/2010   Dr French Ana ; shoulder impingement & torn tendon   TRANSURETHRAL RESECTION OF PROSTATE N/A 02/23/2020   Procedure: TRANSURETHRAL RESECTION OF THE PROSTATE (TURP);  Surgeon: Billey Co, MD;  Location: [facility];  Service: Urology;  Laterality: N/A;       Home Medications    Prior to Admission medications   Medication Sig Start Date End Date Taking? Authorizing Provider  aspirin EC 81 MG tablet Take 81 mg by mouth daily.   Yes [provider]  atorvastatin (LIPITOR) 10 MG tablet Take 1 tablet (10 mg total) by mouth daily. 06/25/20 06/25/21 Yes Biagio Borg, MD  Cholecalciferol (VITAMIN D3 PO) Take 1 tablet by mouth daily.   Yes [provider]  metroNIDAZOLE (FLAGYL) 500 MG tablet Take 4 tablets (2,000 mg total) by mouth once for 1 dose. 05/21/21 05/21/21 Yes Margarette Canada, NP  OXcarbazepine (TRILEPTAL) 150 MG tablet Take 150 mg by mouth as needed (FOR ANXIETY/PTSD).   Yes [provider]  PARoxetine (PAXIL) 40 MG tablet TAKE ONE-HALF TABLET BY MOUTH DAILY FOR MENTAL HEALTH 01/01/20  Yes [provider]  phenazopyridine (PYRIDIUM) 100 MG tablet Take 200 mg by mouth 3 (three) times daily. 01/10/21  Yes [provider]  acetaminophen (TYLENOL) 500 MG  tablet Take 1,000 mg by mouth every 6 (six) hours as needed for moderate pain.    [provider]  albuterol (PROVENTIL HFA;VENTOLIN HFA) 108 (90 Base) MCG/ACT inhaler Inhale 2 puffs into the lungs every 6 (six) hours as needed. For shortness of breath 06/29/16   Martin[place], Betty G, MD  Brinzolamide-Brimonidine 1-0.2 % SUSP Place 1 drop into the left eye 2 (two) times daily.     [provider]  carboxymethylcellulose (REFRESH PLUS) 0.5 % SOLN Apply to eye. 11/15/20   [provider]  clotrimazole-betamethasone (LOTRISONE) cream Use as directed twice per day as needed to the affected area 12/27/20   Biagio Borg, MD  cyclobenzaprine (FLEXERIL) 5 MG tablet Take 1 tablet (5 mg total) by mouth 3 (three) times daily as needed for muscle spasms. 01/12/21   Jearld Fenton, NP  fluticasone (FLONASE) 50 MCG/ACT nasal spray Place into the nose. 07/12/20   [provider]  ketoconazole (NIZORAL) 2 % cream Apply topically. 07/12/20   [provider]  ketotifen (ZADITOR) 0.025 % ophthalmic solution Apply to eye. 11/15/20   [provider]  Multiple Vitamin (MULTIVITAMIN) tablet Take 1 tablet by mouth daily.    [provider]  nortriptyline (PAMELOR) 25 MG capsule Take 1 capsule (25 mg total) by mouth at bedtime. 05/19/21   Tomi Likens, Adam R, DO  sildenafil (VIAGRA) 100 MG tablet TAKE ONE TABLET BY MOUTH AS INSTRUCTED (TAKE 1 HOUR PRIOR TO SEXUAL ACTIVITY *DO NOT EXCEED 1 DOSE PER 24 HOUR PERIOD*) 07/12/20   [provider]  triamcinolone cream (KENALOG) 0.1 % Apply topically. 09/06/20   [provider]  Ubrogepant 100 MG TABS TAKE ONE TABLET BY MOUTH DAILY AS NEEDED - NEW MEDICINE FOR MIGRAINE RELIEF 02/17/21   [provider]  valACYclovir (VALTREX) 1000 MG tablet Take 1/2 tablet by mouth twice daily for 3 days or 1 tablet by mouth every day for 5 days at onset of breakout. 04/24/21   Biagio Borg, MD  omeprazole (PRILOSEC) 20 MG capsule  Take 1 capsule (20 mg total) by mouth daily. 04/03/20 04/13/20  Faustino Congress, NP  topiramate (TOPAMAX) 25 MG tablet topiramate 25 mg tablet Patient not taking: No sig reported  04/13/20  [provider]    Family History Family History  Problem Relation Age of Onset   Heart attack Mother 61   Dementia Father    Stroke Father 41   Diabetes Father        PVD   Glaucoma Father        blindness   Dementia Maternal Aunt    Heart attack  Maternal Uncle         X2,pre 55   Esophageal cancer Maternal Uncle    Heart attack Maternal Grandmother 73   Cancer Paternal Grandmother        ? primary   Asthma Neg Hx    COPD Neg Hx    Colon cancer Neg Hx    Colon polyps Neg Hx    Stomach cancer Neg Hx    Rectal cancer Neg Hx     Social History Social History   Tobacco Use   Smoking status: Never   Smokeless tobacco: Never  Vaping Use   Vaping Use: Never used  Substance Use Topics   Alcohol use: Yes    Alcohol/week: 0.0 standard drinks    Comment: Socially , < 2X/ month   Drug use: No     Allergies   Aleve [naproxen] and Nsaids   Review of Systems Review of Systems  Constitutional:  Negative for activity change, appetite change and fever.  Genitourinary:  Positive for dysuria. Negative for decreased urine volume, difficulty urinating, frequency, genital sores, hematuria, penile discharge, testicular pain and urgency.  Musculoskeletal:  Negative for back pain.  Hematological: Negative.   Psychiatric/Behavioral: Negative.      Physical Exam Triage Vital Signs ED Triage Vitals  Enc Vitals Group     BP 05/21/21 1637 127/79     Pulse Rate 05/21/21 1637 86     Resp 05/21/21 1637 18     Temp 05/21/21 1637 98.4 F (36.9 C)     Temp Source 05/21/21 1637 Oral     SpO2 05/21/21 1637 98 %     Weight 05/21/21 1640 213 lb (96.6 kg)     Height 05/21/21 1640 5' 7.5" (1.715 m)     Head Circumference --      Peak Flow --      Pain Score 05/21/21 1637 0     Pain Loc  --      Pain Edu? --      Excl. in [place]? --    No data found.  Updated Vital Signs BP 127/79 (BP Location: Left Arm)    Pulse 86    Temp 98.4 F (36.9 C) (Oral)    Resp 18    Ht 5' 7.5" (1.715 m)    Wt 213 lb (96.6 kg)    SpO2 98%    BMI 32.87 kg/m   Visual Acuity Right Eye Distance:   Left Eye Distance:   Bilateral Distance:    Right Eye Near:   Left Eye Near:    Bilateral Near:     Physical Exam Vitals and nursing note reviewed.  Constitutional:      General: He is not in acute distress.    Appearance: Normal appearance. He is not ill-appearing.  HENT:     Head: Normocephalic and atraumatic.  Skin:    General: Skin is warm and dry.     Capillary Refill: Capillary refill takes less than 2 seconds.     Findings: No erythema or rash.  Neurological:     General: No focal deficit present.     Mental Status: He is alert and oriented to person, place, and time.  Psychiatric:        Mood and Affect: Mood normal.        Behavior: Behavior normal.        Thought Content: Thought content normal.        Judgment: Judgment normal.     UC  Treatments / Results  Labs (all labs ordered are listed, but only abnormal results are displayed) Labs Reviewed  URINALYSIS, COMPLETE (UACMP) WITH MICROSCOPIC - Abnormal; Notable for the following components:      Result Value   Hgb urine dipstick TRACE (*)    All other components within normal limits    EKG   Radiology No results found.  Procedures Procedures (including critical care time)  Medications Ordered in UC Medications - No data to display  Initial Impression / Assessment and Plan / UC Course  I have reviewed the triage vital signs and the nursing notes.  Pertinent labs & imaging results that were available during my care of the patient were reviewed by me and considered in my medical decision making (see chart for details).  Is a nontoxic-appearing 56 year old male here for evaluation of significant urination that is  not associate with other urinary symptoms or penile discharge as outlined in HPI above.  He did have a recent trichomoniasis exposure that he was tested for, tested negative, and was retreated empirically with 2 g of Flagyl.  He states that his girlfriend was positive for trichomoniasis and they again had sex recently prior to her finishing antibiotic therapy.  This was just prior to him having another round of dysuria.  Patient states he also has a history of prostatitis is concerned that that could be present again though he denies any fever, changes to urinary stream, low back pain, or fullness behind his scrotum and his perineum.  Urinalysis collected at triage which shows trace hemoglobin but is otherwise unremarkable.  Patient has a history of trace hemoglobin in his urine so this is not a new finding.  He had provided a urine specimen and is inquiring about being tested again for trichomoniasis.  I advised him that we cannot use the urine for trichomoniasis testing that would have to be a urethral swab.  He is declining the urethral swab but he is willing to take empiric treatment.  We will treat the patient empirically with 2 g of Flagyl and have him follow-up with his PCP or return for new or developing symptoms.   Final Clinical Impressions(s) / UC Diagnoses   Final diagnoses:  Dysuria     Discharge Instructions      Take the Metronidazole as a single dose      ED Prescriptions     Medication Sig Dispense Auth. Provider   metroNIDAZOLE (FLAGYL) 500 MG tablet Take 4 tablets (2,000 mg total) by mouth once for 1 dose. 4 tablet Margarette Canada, NP      PDMP not reviewed this encounter.   Margarette Canada, NP 05/21/21 [phone]

## 2021-05-23 ENCOUNTER — Ambulatory Visit: Payer: Medicare Other | Admitting: Nurse Practitioner

## 2021-06-01 ENCOUNTER — Encounter: Payer: Self-pay | Admitting: Internal Medicine

## 2021-06-04 ENCOUNTER — Ambulatory Visit: Payer: No Typology Code available for payment source | Admitting: Urology

## 2021-06-06 ENCOUNTER — Encounter: Payer: Self-pay | Admitting: Urology

## 2021-06-11 ENCOUNTER — Encounter: Payer: Self-pay | Admitting: Internal Medicine

## 2021-06-11 NOTE — Telephone Encounter (Signed)
Patient placed on schedule for earliest available 06/16/21. Advised patient if we have any cancellations will contact him

## 2021-06-15 NOTE — Progress Notes (Unsigned)
HPI male never smoker, [employer], followed for allergic rhinitis, asthma, complicated by history PE/DVT 2016, peptic ulcer, depression ([facility]), glaucoma, OSA ([facility]) Allergy Profile 09/25/2011-total IgE 159.6 with multiple specific elevations especially for grass pollens, molds, tree pollens and ragweed. PFT: 10/08/2011-mild obstructive airways disease-small airways, insignificant response to bronchodilator Hypercoag panel 04/26/15- incr Protein C,/ managed by Elna Breslow, [place] He brings [facility] records indicating wheezing and bronchospasm "probably secondary to pneumonia" in 1988 when he was in service in [place]. He was diagnosed with asthma and bronchopneumonia at that time. He says he had no asthma before that pneumonia and he denies family history of asthma. He requests a letter supporting his argument that asthma developed as a consequence of pneumonia which occurred while he was in [employer] service.  ---------------------------------------------------------------------------------------   05/16/20- 56 year old male never smoker, [employer],  followed for allergic Rhinitis, Asthma, complicated by history PE/DVT 2016, peptic ulcer, depression ([facility]), Glaucoma, OSA ([facility]), Covid infection jan, 2022,  Symbicort 160, albuterol HFA, Flonase Covid vax- 2 Moderna Flu vax- declines Had quarantined after Covid positive on Jan 3 , following visit to grandson + in [place]. Minor cough scant clear phlegm. Continues Symbicort daily, using rescue hfa 1x/ month I recommended Covid booster at least 4-6 weeks after infection. CXR 05/17/19- IMPRESSION: Normal chest radiographs.  06/17/21- 56 year old male never smoker, [employer],  followed for allergic Rhinitis, Asthma, complicated by history PE/DVT 2016, peptic ulcer, depression ([facility]), Glaucoma, OSA ([facility]), Covid infection jan, 2022, Depression,  -Symbicort 160, albuterol HFA, Flonase Covid vax- 2 Moderna Flu vax-    ROS-see HPI + =  positive Constitutional:   No-   weight loss, night sweats, fevers, chills, fatigue, lassitude. HEENT:   No-  headaches, difficulty swallowing, tooth/dental problems, sore throat,    no-sneezing, no-itching, ear ache, no- nasal congestion, post nasal drip,  CV:  +chest pain, No- orthopnea, PND, swelling in lower extremities, anasarca, dizziness, palpitations Resp: +shortness of breath with exertion or at rest.              No-  productive cough,  No non-productive cough,  No- coughing up of blood.                change in color of mucus.  No- wheezing.   Skin:  GI:  No-   heartburn, indigestion, abdominal pain, nausea, vomiting, GU:, MS:  No-   joint pain or swelling.   Neuro-     nothing unusual Psych:  No- change in mood or affect. No depression or anxiety.  No memory loss.  OBJ- Physical Exam General- Alert, Oriented, Affect-appropriate, Distress- none acute Skin- + mild hyperkeratosis on anterior chest Lymphadenopathy- none Head- atraumatic            Eyes- Gross vision intact, PERRLA, conjunctivae and secretions clear            Ears- Hearing, canals-normal            Nose-no-sniffing, no-Septal dev, polyps, erosion, perforation             Throat- Mallampati III , mucosa clear , drainage- none, tonsils- atrophic Neck- flexible , trachea midline, no stridor , thyroid nl, carotid no bruit Chest - symmetrical excursion , unlabored           Heart/CV- RRR , no murmur , no gallop  , no rub, nl s1 s2                           -  JVD- none , edema- none, stasis changes- none, varices- none           Lung- clear to P&A, wheeze-none, cough- none , dullness-none, rub- none           Chest wall-  Abd-  Br/ Gen/ Rectal- Not done, not indicated Extrem- cyanosis- none, clubbing, none, atrophy- none, strength- nl, negative Homan's Neuro- grossly intact to observation

## 2021-06-16 ENCOUNTER — Encounter: Payer: Self-pay | Admitting: Internal Medicine

## 2021-06-16 ENCOUNTER — Ambulatory Visit: Payer: Medicare Other | Admitting: Internal Medicine

## 2021-06-17 ENCOUNTER — Ambulatory Visit: Payer: Medicare Other | Admitting: Internal Medicine

## 2021-06-26 ENCOUNTER — Encounter: Payer: Self-pay | Admitting: Internal Medicine

## 2021-06-26 ENCOUNTER — Ambulatory Visit (INDEPENDENT_AMBULATORY_CARE_PROVIDER_SITE_OTHER): Payer: Medicare Other

## 2021-06-26 ENCOUNTER — Ambulatory Visit (INDEPENDENT_AMBULATORY_CARE_PROVIDER_SITE_OTHER): Payer: Medicare Other | Admitting: Internal Medicine

## 2021-06-26 VITALS — BP 118/60 | HR 102 | Temp 98.8°F | Ht 67.5 in | Wt 215.0 lb

## 2021-06-26 DIAGNOSIS — J309 Allergic rhinitis, unspecified: Secondary | ICD-10-CM | POA: Diagnosis not present

## 2021-06-26 DIAGNOSIS — E538 Deficiency of other specified B group vitamins: Secondary | ICD-10-CM | POA: Diagnosis not present

## 2021-06-26 DIAGNOSIS — Z0001 Encounter for general adult medical examination with abnormal findings: Secondary | ICD-10-CM

## 2021-06-26 DIAGNOSIS — R0789 Other chest pain: Secondary | ICD-10-CM | POA: Diagnosis not present

## 2021-06-26 DIAGNOSIS — K921 Melena: Secondary | ICD-10-CM | POA: Diagnosis not present

## 2021-06-26 DIAGNOSIS — G8929 Other chronic pain: Secondary | ICD-10-CM

## 2021-06-26 DIAGNOSIS — R739 Hyperglycemia, unspecified: Secondary | ICD-10-CM

## 2021-06-26 DIAGNOSIS — E785 Hyperlipidemia, unspecified: Secondary | ICD-10-CM

## 2021-06-26 DIAGNOSIS — E559 Vitamin D deficiency, unspecified: Secondary | ICD-10-CM | POA: Diagnosis not present

## 2021-06-26 LAB — CBC WITH DIFFERENTIAL/PLATELET
Basophils Absolute: 0 10*3/uL (ref 0.0–0.1)
Basophils Relative: 0.8 % (ref 0.0–3.0)
Eosinophils Absolute: 0.5 10*3/uL (ref 0.0–0.7)
Eosinophils Relative: 7.3 % — ABNORMAL HIGH (ref 0.0–5.0)
HCT: 44.3 % (ref 39.0–52.0)
Hemoglobin: 14.4 g/dL (ref 13.0–17.0)
Lymphocytes Relative: 30.5 % (ref 12.0–46.0)
Lymphs Abs: 1.9 10*3/uL (ref 0.7–4.0)
MCHC: 32.5 g/dL (ref 30.0–36.0)
MCV: 85.2 fl (ref 78.0–100.0)
Monocytes Absolute: 0.8 10*3/uL (ref 0.1–1.0)
Monocytes Relative: 12 % (ref 3.0–12.0)
Neutro Abs: 3.2 10*3/uL (ref 1.4–7.7)
Neutrophils Relative %: 49.4 % (ref 43.0–77.0)
Platelets: 248 10*3/uL (ref 150.0–400.0)
RBC: 5.2 Mil/uL (ref 4.22–5.81)
RDW: 16 % — ABNORMAL HIGH (ref 11.5–15.5)
WBC: 6.4 10*3/uL (ref 4.0–10.5)

## 2021-06-26 LAB — URINALYSIS, ROUTINE W REFLEX MICROSCOPIC
Bilirubin Urine: NEGATIVE
Ketones, ur: NEGATIVE
Leukocytes,Ua: NEGATIVE
Nitrite: NEGATIVE
Specific Gravity, Urine: >=1.03 — AB (ref 1.000–1.030)
Total Protein, Urine: NEGATIVE
Urine Glucose: NEGATIVE
Urobilinogen, UA: 0.2 (ref 0.0–1.0)
pH: 6 (ref 5.0–8.0)

## 2021-06-26 LAB — LIPID PANEL
Cholesterol: 145 mg/dL (ref 0–200)
HDL: 51 mg/dL (ref >39.00)
LDL Cholesterol: 64 mg/dL (ref 0–99)
NonHDL: 94.28
Total CHOL/HDL Ratio: 3
Triglycerides: 151 mg/dL — ABNORMAL HIGH (ref 0.0–149.0)
VLDL: 30.2 mg/dL (ref 0.0–40.0)

## 2021-06-26 LAB — HEPATIC FUNCTION PANEL
ALT: 32 U/L (ref 0–53)
AST: 23 U/L (ref 0–37)
Albumin: 4.6 g/dL (ref 3.5–5.2)
Alkaline Phosphatase: 72 U/L (ref 39–117)
Bilirubin, Direct: 0.1 mg/dL (ref 0.0–0.3)
Total Bilirubin: 0.6 mg/dL (ref 0.2–1.2)
Total Protein: 7.4 g/dL (ref 6.0–8.3)

## 2021-06-26 LAB — TSH: TSH: 0.81 u[IU]/mL (ref 0.35–5.50)

## 2021-06-26 LAB — BASIC METABOLIC PANEL
BUN: 13 mg/dL (ref 6–23)
CO2: 26 mEq/L (ref 19–32)
Calcium: 9.9 mg/dL (ref 8.4–10.5)
Chloride: 106 mEq/L (ref 96–112)
Creatinine, Ser: 1.1 mg/dL (ref 0.40–1.50)
GFR: 76.15 mL/min (ref >60.00)
Glucose, Bld: 80 mg/dL (ref 70–99)
Potassium: 4.1 mEq/L (ref 3.5–5.1)
Sodium: 140 mEq/L (ref 135–145)

## 2021-06-26 LAB — VITAMIN B12: Vitamin B-12: 738 pg/mL (ref 211–911)

## 2021-06-26 LAB — HEMOGLOBIN A1C: Hgb A1c MFr Bld: 6.1 % (ref 4.6–6.5)

## 2021-06-26 LAB — VITAMIN D 25 HYDROXY (VIT D DEFICIENCY, FRACTURES): VITD: 45.49 ng/mL (ref 30.00–100.00)

## 2021-06-26 LAB — PSA: PSA: 0.46 ng/mL (ref 0.10–4.00)

## 2021-06-26 NOTE — Patient Instructions (Signed)
Please continue all other medications as before, and refills have been done if requested.  Please have the pharmacy call with any other refills you may need.  Please continue your efforts at being more active, low cholesterol diet, and weight control.  You are otherwise up to date with prevention measures today.  Please keep your appointments with your specialists as you may have planned  You will be contacted regarding the referral for: Dr Nelva Bush for chronic pain to the chest  Please go to the XRAY Department in the first floor for the x-ray testing  Please go to the LAB at the blood drawing area for the tests to be done  You will be contacted by phone if any changes need to be made immediately.  Otherwise, you will receive a letter about your results with an explanation, but please check with MyChart first.  Please remember to sign up for MyChart if you have not done so, as this will be important to you in the future with finding out test results, communicating by private email, and scheduling acute appointments online when needed.  Please make an Appointment to return in 6 months, or sooner if needed

## 2021-06-26 NOTE — Progress Notes (Signed)
Patient ID: Anthony [name], male   DOB: [birth year], 56 y.o.   MRN: [id number]         Chief Complaint:: wellness exam and Office Visit (Pain in RUQ)        HPI:  Anthony [name] is a 56 y.o. male here for wellness exam; declines all immunizations, o/w up to date                        Also has > 2 yrs ongoign mild intermittent right lower lateral chest pain, sharp and dull, sometimes pleuritic but no sob, palps, diaphoresis, and is not pleuritic, positional or exertional.  Pt denies other chest pain, increased sob or doe, wheezing, orthopnea, PND, increased LE swelling, palpitations, dizziness or syncope.  Pt denies polydipsia, polyuria, or new focal neuro s/s.  Pt denies fever, wt loss, night sweats, loss of appetite, or other constitutional symptoms  Does have several wks ongoing nasal allergy symptoms with clearish congestion, itch and sneezing, without fever, pain, ST, cough, swelling or wheezing.   Wt Readings from Last 3 Encounters:  06/26/21 215 lb (97.5 kg)  05/21/21 213 lb (96.6 kg)  05/19/21 217 lb (98.4 kg)   BP Readings from Last 3 Encounters:  06/26/21 118/60  05/21/21 127/79  05/19/21 111/76   Immunization History  Administered Date(s) Administered   Moderna Sars-Covid-2 Vaccination 07/27/2019   PFIZER(Purple Top)SARS-COV-2 Vaccination 08/18/2019, 09/17/2019   Tdap 01/19/2011   There are no preventive care reminders to display for this patient.     Past Medical History:  Diagnosis Date   Allergy    Past hx    Anemia    Anxiety    Asthma    Cataract    removed left- forming on the right  DVT and PE from long travel trip    Chronic kidney disease 2006   kidney stones    Clotting disorder ([place]) 2016   PE - small    Complication of anesthesia    HARD TO WAKE UP AFTER 2ND EYE SURGERY   COVID-19 virus infection    Depression    DVT (deep venous thrombosis) ([facility])    DUE TO A LONG TRAVEL   Fatty liver    Genital herpes 05/06/2018   H/O renal calculi 2006    Headache    MIGRAINES   History of kidney stones    H/O   History of methicillin resistant staphylococcus aureus (MRSA)    Hyperlipidemia    Internal hemorrhoid    PE (pulmonary embolism)    Peptic ulcer disease    Pneumonia    h/o   PTSD (post-traumatic stress disorder)    Sleep apnea    DOES NOT USE CPAP CURRENTLY-MASK DOES NOT FIT CORRECTLY [facility]13[facility]20[facility]   Spondylosis    Tubular adenoma of colon 2011   Dr Olevia Perches   Past Surgical History:  Procedure Laterality Date   CATARACT EXTRACTION Left 1996   Dr Elonda Husky; OS   COLONOSCOPY     colonoscopy with polypectomy  2011   Dr Olevia Perches, hemorrhoids   CYSTOSCOPY W/ STONE MANIPULATION  2006   [place] Right 12/17/2010   Dr French Ana ; shoulder impingement & torn tendon   TRANSURETHRAL RESECTION OF PROSTATE N/A 02/23/2020   Procedure: TRANSURETHRAL RESECTION OF THE PROSTATE (TURP);  Surgeon: [name]  C, MD;  Location: [facility] ORS;  Service: Urology;  Laterality: N/A;    reports that he has never smoked. He has never used smokeless tobacco. He reports current alcohol use. He reports that he does not use drugs. family history includes Cancer in his paternal grandmother; Dementia in his father and maternal aunt; Diabetes in his father; Esophageal cancer in his maternal uncle; Glaucoma in his father; Heart attack in his maternal uncle; Heart attack (age of onset: 14) in his mother; Heart attack (age of onset: 23) in his maternal grandmother; Stroke (age of onset: 30) in his father. Allergies  Allergen Reactions   Aleve [Naproxen] Rash    Patient stated it was years ago   Nsaids Other (See Comments)    Bleeding ulcers   Current Outpatient Medications on File Prior to Visit  Medication Sig Dispense Refill   acetaminophen (TYLENOL) 500 MG tablet Take 1,000 mg by mouth every 6 (six) hours as needed for moderate pain.     albuterol  (PROVENTIL HFA;VENTOLIN HFA) 108 (90 Base) MCG/ACT inhaler Inhale 2 puffs into the lungs every 6 (six) hours as needed. For shortness of breath 1 Inhaler 1   aspirin EC 81 MG tablet Take 81 mg by mouth daily.     Brinzolamide-Brimonidine 1-0.2 % SUSP Place 1 drop into the left eye 2 (two) times daily.      carboxymethylcellulose (REFRESH PLUS) 0.5 % SOLN Apply to eye.     Cholecalciferol (VITAMIN D3 PO) Take 1 tablet by mouth daily.     clotrimazole-betamethasone (LOTRISONE) cream Use as directed twice per day as needed to the affected area 45 g 1   cyclobenzaprine (FLEXERIL) 5 MG tablet Take 1 tablet (5 mg total) by mouth 3 (three) times daily as needed for muscle spasms. 15 tablet 0   fluticasone (FLONASE) 50 MCG/ACT nasal spray Place into the nose.     ketoconazole (NIZORAL) 2 % cream Apply topically.     ketotifen (ZADITOR) 0.025 % ophthalmic solution Apply to eye.     Multiple Vitamin (MULTIVITAMIN) tablet Take 1 tablet by mouth daily.     nortriptyline (PAMELOR) 25 MG capsule Take 1 capsule (25 mg total) by mouth at bedtime. 30 capsule 5   OXcarbazepine (TRILEPTAL) 150 MG tablet Take 150 mg by mouth as needed (FOR ANXIETY/PTSD).     PARoxetine (PAXIL) 40 MG tablet TAKE ONE-HALF TABLET BY MOUTH DAILY FOR MENTAL HEALTH     phenazopyridine (PYRIDIUM) 100 MG tablet Take 200 mg by mouth 3 (three) times daily.     sildenafil (VIAGRA) 100 MG tablet TAKE ONE TABLET BY MOUTH AS INSTRUCTED (TAKE 1 HOUR PRIOR TO SEXUAL ACTIVITY *DO NOT EXCEED 1 DOSE PER 24 HOUR PERIOD*)     triamcinolone cream (KENALOG) 0.1 % Apply topically.     Ubrogepant 100 MG TABS TAKE ONE TABLET BY MOUTH DAILY AS NEEDED - NEW MEDICINE FOR MIGRAINE RELIEF     valACYclovir (VALTREX) 1000 MG tablet Take 1/2 tablet by mouth twice daily for 3 days or 1 tablet by mouth every day for 5 days at onset of breakout. 90 tablet 1   atorvastatin (LIPITOR) 10 MG tablet Take 1 tablet (10 mg total) by mouth daily. 90 tablet 3   [DISCONTINUED]  omeprazole (PRILOSEC) 20 MG capsule Take 1 capsule (20 mg total) by mouth daily. 30 capsule 0   [DISCONTINUED] topiramate (TOPAMAX) 25 MG tablet topiramate 25 mg tablet (Patient not taking: No sig reported)     No current facility-administered medications  on file prior to visit.        ROS:  All others reviewed and negative.  Objective        PE:  BP 118/60 (BP Location: Right Arm, Patient Position: Sitting, Cuff Size: Large)    Pulse (!) 102    Temp 98.8 F (37.1 C) (Oral)    Ht 5' 7.5" (1.715 m)    Wt 215 lb (97.5 kg)    SpO2 96%    BMI 33.18 kg/m                 Constitutional: Pt appears in NAD               HENT: Head: NCAT.                Right Ear: External ear normal.                 Left Ear: External ear normal.                Eyes: . Pupils are equal, round, and reactive to light. Conjunctivae and EOM are normal               Nose: without d/c or deformity               Neck: Neck supple. Gross normal ROM               Cardiovascular: Normal rate and regular rhythm.                 Pulmonary/Chest: Effort normal and breath sounds without rales or wheezing.                Abd:  Soft, NT, ND, + BS, no organomegaly               Neurological: Pt is alert. At baseline orientation, motor grossly intact               Skin: Skin is warm. No rashes, no other new lesions, LE edema - none               Psychiatric: Pt behavior is normal without agitation   Micro: none  Cardiac tracings I have personally interpreted today:  none  Pertinent Radiological findings (summarize): none   Lab Results  Component Value Date   WBC 6.4 06/26/2021   HGB 14.4 06/26/2021   HCT 44.3 06/26/2021   PLT 248.0 06/26/2021   GLUCOSE 80 06/26/2021   CHOL 145 06/26/2021   TRIG 151.0 (H) 06/26/2021   HDL 51.00 06/26/2021   LDLDIRECT 89.0 04/20/2019   LDLCALC 64 06/26/2021   ALT 32 06/26/2021   AST 23 06/26/2021   NA 140 06/26/2021   K 4.1 06/26/2021   CL 106 06/26/2021   CREATININE 1.10  06/26/2021   BUN 13 06/26/2021   CO2 26 06/26/2021   TSH 0.81 06/26/2021   PSA 0.46 06/26/2021   HGBA1C 6.1 06/26/2021   Assessment/Plan:  Anthony [name] is a 56 y.o. Black or African American [2] male with  has a past medical history of Allergy, Anemia, Anxiety, Asthma, Cataract, Chronic kidney disease (2006), Clotting disorder ([place]) (9211), Complication of anesthesia, COVID-19 virus infection, Depression, DVT (deep venous thrombosis) ([place]), Fatty liver, Genital herpes (05/06/2018), H/O renal calculi (2006), Headache, History of kidney stones, History of methicillin resistant staphylococcus aureus (MRSA), Hyperlipidemia, Internal hemorrhoid, PE (pulmonary embolism), Peptic ulcer disease, Pneumonia, PTSD (post-traumatic stress disorder), Sleep apnea, Spondylosis, and Tubular  adenoma of colon (2011).  Encounter for well adult exam with abnormal findings Age and sex appropriate education and counseling updated with regular exercise and diet Referrals for preventative services - none needed Immunizations addressed - declines all immunizations Smoking counseling  - none needed Evidence for depression or other mood disorder - none significant Most recent labs reviewed. I have personally reviewed and have noted: 1) the patient's medical and social history 2) The patient's current medications and supplements 3) The patient's height, weight, and BMI have been recorded in the chart   Hyperglycemia Lab Results  Component Value Date   HGBA1C 6.1 06/26/2021   Stable, pt to continue current medical treatment  - diet   Hyperlipidemia Lab Results  Component Value Date   LDLCALC 64 06/26/2021   Stable, pt to continue current statin lipitor   Chronic chest wall pain C/w msk vs neuritic, declines trial gabapentin, for cxr today, consider pain clinic referral for nerve ablation  Allergic rhinitis Mild worsening uncontrolled, for restart flonase asd, to f/u any worsening symptoms or  concerns  Followup: Return in about 6 months (around 12/24/2021).  Cathlean Cower, MD 06/28/2021 5:43 PM [facility]

## 2021-06-28 ENCOUNTER — Encounter: Payer: Self-pay | Admitting: Internal Medicine

## 2021-06-28 NOTE — Assessment & Plan Note (Signed)
Mild worsening uncontrolled, for restart flonase asd, to f/u any worsening symptoms or concerns

## 2021-06-28 NOTE — Assessment & Plan Note (Signed)
C/w msk vs neuritic, declines trial gabapentin, for cxr today, consider pain clinic referral for nerve ablation

## 2021-06-28 NOTE — Assessment & Plan Note (Signed)
Lab Results  Component Value Date   HGBA1C 6.1 06/26/2021   Stable, pt to continue current medical treatment  - diet

## 2021-06-28 NOTE — Assessment & Plan Note (Signed)
Lab Results  Component Value Date   LDLCALC 64 06/26/2021   Stable, pt to continue current statin lipitor

## 2021-06-28 NOTE — Assessment & Plan Note (Signed)
Age and sex appropriate education and counseling updated with regular exercise and diet Referrals for preventative services - none needed Immunizations addressed - declines all immunizations Smoking counseling  - none needed Evidence for depression or other mood disorder - none significant Most recent labs reviewed. I have personally reviewed and have noted: 1) the patient's medical and social history 2) The patient's current medications and supplements 3) The patient's height, weight, and BMI have been recorded in the chart  

## 2021-07-09 ENCOUNTER — Other Ambulatory Visit: Payer: Self-pay

## 2021-07-09 ENCOUNTER — Encounter: Payer: Self-pay | Admitting: Emergency Medicine

## 2021-07-09 ENCOUNTER — Ambulatory Visit
Admission: EM | Admit: 2021-07-09 | Discharge: 2021-07-09 | Disposition: A | Discharge status: Home / Self Care | Payer: No Typology Code available for payment source | Attending: Internal Medicine | Admitting: Internal Medicine

## 2021-07-09 DIAGNOSIS — B9789 Other viral agents as the cause of diseases classified elsewhere: Secondary | ICD-10-CM | POA: Diagnosis not present

## 2021-07-09 DIAGNOSIS — J040 Acute laryngitis: Secondary | ICD-10-CM | POA: Diagnosis not present

## 2021-07-09 MED ORDER — BENZONATATE 100 MG PO CAPS: 200.0000 mg | ORAL_CAPSULE | Freq: Three times a day (TID) | ORAL | 0 refills | Status: DC | PRN

## 2021-07-09 NOTE — ED Triage Notes (Signed)
Pt here with hoarseness starting 5 days ago and a cough starting last night. Endorses that this started after having a stomach virus last week.  ?

## 2021-07-09 NOTE — ED Provider Notes (Signed)
Anthony [name]    CSN: [id number] Arrival date & time: 07/09/21  0801      History   Chief Complaint Chief Complaint  Patient presents with   Cough   Hoarse    HPI Anthony [name] is a 56 y.o. male comes to urgent care with a 5-day history of nasal congestion, hoarseness of voice and cough of 1 day duration.  Patient was unwell with vomiting and diarrhea last week.  Vomiting and diarrhea has subsided.  Started experiencing hoarseness of voice after the diarrhea and vomiting subsided.  He denies any sore throat.  No shortness of breath or wheezing.  Patient was evaluated last week and tested negative for COVID and flu.  He does not smoke cigarettes.  He is currently taking Mucinex.  HPI  Past Medical History:  Diagnosis Date   Allergy    Past hx    Anemia    Anxiety    Asthma    Cataract    removed left- forming on the right  DVT and PE from long travel trip    Chronic kidney disease 2006   kidney stones    Clotting disorder ([place]) 2016   PE - small    Complication of anesthesia    HARD TO WAKE UP AFTER 2ND EYE SURGERY   COVID-19 virus infection    Depression    DVT (deep venous thrombosis) (HCC)    DUE TO A LONG TRAVEL   Fatty liver    Genital herpes 05/06/2018   H/O renal calculi 2006   Headache    MIGRAINES   History of kidney stones    H/O   History of methicillin resistant staphylococcus aureus (MRSA)    Hyperlipidemia    Internal hemorrhoid    PE (pulmonary embolism)    Peptic ulcer disease    Pneumonia    h/o   PTSD (post-traumatic stress disorder)    Sleep apnea    DOES NOT USE CPAP CURRENTLY-MASK DOES NOT [facility]LY [facility]   Spondylosis    Tubular adenoma of colon 2011   Dr Olevia Perches    Patient Active Problem List   Diagnosis Date Noted   Chronic chest wall pain 06/26/2021   Ocular hypertension, 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   Unspecified disorder of refraction 05/09/2021   Bilateral impacted cerumen  04/26/2021   Cellulitis of left elbow 11/19/2020   Allergic rhinitis 10/15/2020   Brow ptosis, left 10/15/2020   Bunion of right foot 10/15/2020   Contact with and (suspected) exposure to covid-19 10/15/2020   COVID-19 10/15/2020   Deposits (accretions) on teeth 10/15/2020   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, left eye, stage unspecified 10/15/2020   Hematuria, unspecified 10/15/2020   Inflammatory disease of prostate, unspecified 10/15/2020   Male erectile disorder (CODE) 10/15/2020   Male erectile dysfunction, unspecified 10/15/2020   Nondependent alcohol abuse, continuous drinking behavior 10/15/2020   Obesity 10/15/2020   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   Unspecified ptosis of left eyelid 10/15/2020   Unspecified symptoms and signs involving cognitive functions and awareness 10/15/2020   Other recurrent depressive disorders ([place]) 10/15/2020   Post-traumatic stress disorder, unspecified 10/15/2020   Adjustment disorder, unspecified 10/15/2020   Encounter for dental examination and cleaning without abnormal findings 10/15/2020   Recurrent major depression ([place]) 10/15/2020   Major depressive disorder, recurrent, unspecified ([place]) 10/15/2020   Mood disorder ([place]) 10/15/2020   Dietary counseling and surveillance 10/15/2020   Other specified counseling 10/15/2020  Headache, unspecified 10/15/2020   Herpes simplex 10/15/2020   Migraine with aura, intractable, with status migrainosus 10/15/2020   Migraine without aura, not intractable, with status migrainosus 10/15/2020   Low back pain, unspecified 10/15/2020   Pain in left leg 10/15/2020   Other asthma 10/15/2020   Meralgia paresthetica of left side 07/03/2020   Major depressive disorder, single episode, unspecified 06/25/2020   Leg cramping 09/04/2019   Pain in joint of right elbow 08/03/2019   Lateral epicondylitis of right elbow 08/03/2019   Nephrolithiasis 05/11/2019   Epigastric pain 05/11/2019   Other constipation 05/11/2019    Right sided abdominal pain 05/11/2019   Paresthesia 04/20/2019   Chronic post-traumatic stress disorder 01/14/2019   Left ear hearing loss 01/14/2019   Low back pain 01/14/2019   Intractable migraine with aura without status migrainosus 01/07/2019   Genital herpes 05/06/2018   STD exposure 05/06/2018   Obstructive sleep apnea 02/13/2018   Hyperglycemia 01/24/2018   Pelvic pain 07/10/2017   Exposure to blood or body fluid 07/07/2017   Trigger thumb of right hand 05/21/2017   Trigger finger, right middle finger 03/18/2017   Anxiety 03/18/2017   Chronic tension-type headache, not intractable 02/12/2017   Herpes simplex type II infection 12/02/2016   Dysuria 12/02/2016   Pain of right thumb 11/13/2016   Other chest pain 07/27/2016   Onychomycosis 06/12/2016   Encounter for well adult exam with abnormal findings 06/12/2016   Generalized headache 05/15/2016   Lumbar paraspinal muscle spasm 05/15/2016   History of DVT (deep vein thrombosis) 11/18/2015   Elevated PSA 11/18/2015   Acute pulmonary embolism ([place]) 2016 05/09/2015   Peptic ulcer 05/09/2015   Dyspnea 05/02/2015   DVT (deep venous thrombosis) ([place]) 04/26/2015   Duodenal ulcer 04/25/2015   Positive D dimer 04/23/2015   Internal hemorrhoid, bleeding 04/04/2015   Acute gastrointestinal hemorrhage 04/03/2015   Duodenitis 04/03/2015   Postural dizziness with presyncope 04/03/2015   Microscopic hematuria 04/03/2015   Dislocated IOL (intraocular lens), initial encounter 12/10/2014   MRSA infection 09/14/2013   History of methicillin resistant Staphylococcus aureus infection 02/17/2013   Spondylosis of lumbar joint 09/28/2012   Fatty liver disease, nonalcoholic 70/26/3785   Lumbosacral spondylosis without myelopathy 09/28/2012   Fatty metamorphosis of liver 09/28/2012   Atopic dermatitis, unspecified 05/13/2012   Seasonal and perennial allergic rhinitis 10/01/2011   Family history of ischemic heart disease 04/02/2011   Low  serum testosterone level 03/12/2011   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/12/2011   COLONIC POLYPS, HX OF 06/21/2009   Hyperlipidemia 02/16/2008   Asthma, mild persistent 02/16/2008   NONSPECIFIC ABNORMAL ELECTROCARDIOGRAM 02/16/2008   NEPHROLITHIASIS, HX OF 02/16/2008    Past Surgical History:  Procedure Laterality Date   CATARACT EXTRACTION Left 1996   Dr Elonda Husky; OS   COLONOSCOPY     colonoscopy with polypectomy  2011   Dr Olevia Perches, hemorrhoids   CYSTOSCOPY W/ STONE MANIPULATION  2006   EYE SURGERY Left    INGUINAL HERNIA REPAIR Right 1990   POLYPECTOMY     SHOULDER SURGERY Right 12/17/2010   Dr French Ana ; shoulder impingement & torn tendon   TRANSURETHRAL RESECTION OF PROSTATE N/A 02/23/2020   Procedure: TRANSURETHRAL RESECTION OF THE PROSTATE (TURP);  Surgeon: Billey Co, MD;  Location: [facility] ORS;  Service: Urology;  Laterality: N/A;       Home Medications    Prior to Admission medications   Medication Sig Start Date End Date Taking? Authorizing Provider  benzonatate (TESSALON) 100 MG capsule Take 2 capsules (  200 mg total) by mouth 3 (three) times daily as needed for cough. 07/09/21  Yes Ulyana Pitones, Myrene Galas, MD  acetaminophen (TYLENOL) 500 MG tablet Take 1,000 mg by mouth every 6 (six) hours as needed for moderate pain.    [provider]  albuterol (PROVENTIL HFA;VENTOLIN HFA) 108 (90 Base) MCG/ACT inhaler Inhale 2 puffs into the lungs every 6 (six) hours as needed. For shortness of breath 06/29/16   Martinique, Betty G, MD  aspirin EC 81 MG tablet Take 81 mg by mouth daily.    [provider]  atorvastatin (LIPITOR) 10 MG tablet Take 1 tablet (10 mg total) by mouth daily. 06/25/20 06/25/21  Biagio Borg, MD  Brinzolamide-Brimonidine 1-0.2 % SUSP Place 1 drop into the left eye 2 (two) times daily.     [provider]  carboxymethylcellulose (REFRESH PLUS) 0.5 % SOLN Apply to eye. 11/15/20   [provider]  Cholecalciferol (VITAMIN D3 PO) Take 1 tablet by mouth  daily.    [provider]  clotrimazole-betamethasone (LOTRISONE) cream Use as directed twice per day as needed to the affected area 12/27/20   Biagio Borg, MD  cyclobenzaprine (FLEXERIL) 5 MG tablet Take 1 tablet (5 mg total) by mouth 3 (three) times daily as needed for muscle spasms. 01/12/21   Jearld Fenton, NP  fluticasone (FLONASE) 50 MCG/ACT nasal spray Place into the nose. 07/12/20   [provider]  ketoconazole (NIZORAL) 2 % cream Apply topically. 07/12/20   [provider]  ketotifen (ZADITOR) 0.025 % ophthalmic solution Apply to eye. 11/15/20   [provider]  Multiple Vitamin (MULTIVITAMIN) tablet Take 1 tablet by mouth daily.    [provider]  nortriptyline (PAMELOR) 25 MG capsule Take 1 capsule (25 mg total) by mouth at bedtime. 05/19/21   Tomi Likens, Adam R, DO  OXcarbazepine (TRILEPTAL) 150 MG tablet Take 150 mg by mouth as needed (FOR ANXIETY/PTSD).    [provider]  PARoxetine (PAXIL) 40 MG tablet TAKE ONE-HALF TABLET BY MOUTH DAILY FOR MENTAL HEALTH 01/01/20   [provider]  phenazopyridine (PYRIDIUM) 100 MG tablet Take 200 mg by mouth 3 (three) times daily. 01/10/21   [provider]  sildenafil (VIAGRA) 100 MG tablet TAKE ONE TABLET BY MOUTH AS INSTRUCTED (TAKE 1 HOUR PRIOR TO SEXUAL ACTIVITY *DO NOT EXCEED 1 DOSE PER 24 HOUR PERIOD*) 07/12/20   [provider]  triamcinolone cream (KENALOG) 0.1 % Apply topically. 09/06/20   [provider]  Ubrogepant 100 MG TABS TAKE ONE TABLET BY MOUTH DAILY AS NEEDED - NEW MEDICINE FOR MIGRAINE RELIEF 02/17/21   [provider]  valACYclovir (VALTREX) 1000 MG tablet Take 1/2 tablet by mouth twice daily for 3 days or 1 tablet by mouth every day for 5 days at onset of breakout. 04/24/21   Biagio Borg, MD  omeprazole (PRILOSEC) 20 MG capsule Take 1 capsule (20 mg total) by mouth daily. 04/03/20 04/13/20  Faustino Congress, NP  topiramate (TOPAMAX) 25  MG tablet topiramate 25 mg tablet Patient not taking: No sig reported  04/13/20  [provider]    Family History Family History  Problem Relation Age of Onset   Heart attack Mother 56   Dementia Father    Stroke Father 63   Diabetes Father        PVD   Glaucoma Father        blindness   Dementia Maternal Aunt    Heart attack Maternal Uncle  X2,pre 73   Esophageal cancer Maternal Uncle    Heart attack Maternal Grandmother 73   Cancer Paternal Grandmother        ? primary   Asthma Neg Hx    COPD Neg Hx    Colon cancer Neg Hx    Colon polyps Neg Hx    Stomach cancer Neg Hx    Rectal cancer Neg Hx     Social History Social History   Tobacco Use   Smoking status: Never   Smokeless tobacco: Never  Vaping Use   Vaping Use: Never used  Substance Use Topics   Alcohol use: Yes    Alcohol/week: 0.0 standard drinks    Comment: Socially , < 2X/ month   Drug use: No     Allergies   Aleve [naproxen] and Nsaids   Review of Systems Review of Systems  Constitutional: Negative.  Negative for chills and fever.  HENT:  Positive for congestion and voice change. Negative for sore throat.   Respiratory: Negative.    Gastrointestinal: Negative.   Musculoskeletal: Negative.     Physical Exam Triage Vital Signs ED Triage Vitals  Enc Vitals Group     BP 07/09/21 0820 112/82     Pulse Rate 07/09/21 0820 81     Resp 07/09/21 0820 20     Temp 07/09/21 0820 98.3 F (36.8 C)     Temp src --      SpO2 07/09/21 0820 97 %     Weight --      Height --      Head Circumference --      Peak Flow --      Pain Score 07/09/21 0822 0     Pain Loc --      Pain Edu? --      Excl. in [place]? --    No data found.  Updated Vital Signs BP 112/82    Pulse 81    Temp 98.3 F (36.8 C)    Resp 20    SpO2 97%   Visual Acuity Right Eye Distance:   Left Eye Distance:   Bilateral Distance:    Right Eye Near:   Left Eye Near:    Bilateral Near:     Physical  Exam Vitals and nursing note reviewed.  Constitutional:      General: He is not in acute distress.    Appearance: He is not ill-appearing.  HENT:     Right Ear: Tympanic membrane normal.     Left Ear: Tympanic membrane normal.     Mouth/Throat:     Mouth: Mucous membranes are moist.     Pharynx: No oropharyngeal exudate or posterior oropharyngeal erythema.  Cardiovascular:     Rate and Rhythm: Normal rate and regular rhythm.  Pulmonary:     Effort: Pulmonary effort is normal.     Breath sounds: Normal breath sounds.  Abdominal:     General: Bowel sounds are normal.     Palpations: Abdomen is soft.  Musculoskeletal:        General: Normal range of motion.     Cervical back: Normal range of motion. No rigidity or tenderness.  Neurological:     Mental Status: He is alert.     UC Treatments / Results  Labs (all labs ordered are listed, but only abnormal results are displayed) Labs Reviewed - No data to display  EKG   Radiology No results found.  Procedures Procedures (including critical care time)  Medications  Ordered in UC Medications - No data to display  Initial Impression / Assessment and Plan / UC Course  I have reviewed the triage vital signs and the nursing notes.  Pertinent labs & imaging results that were available during my care of the patient were reviewed by me and considered in my medical decision making (see chart for details).     1.  Acute viral laryngitis: Tessalon Perles as needed for cough Maintain adequate hydration Continue Flonase use If you develop shortness of breath or wheezing please return to urgent care to be reevaluated Humidifier use will help with nasal congestion and cough. Final Clinical Impressions(s) / UC Diagnoses   Final diagnoses:  Viral laryngitis     Discharge Instructions      Increase oral fluid intake Take medications as prescribed If you have worsening shortness of breath, cough or hoarseness of voice please  return to urgent care to be reevaluated.   ED Prescriptions     Medication Sig Dispense Auth. Provider   benzonatate (TESSALON) 100 MG capsule Take 2 capsules (200 mg total) by mouth 3 (three) times daily as needed for cough. 40 capsule Dravin Lance, Myrene Galas, MD      PDMP not reviewed this encounter.   Chase Picket, MD 07/09/21 [phone]

## 2021-07-09 NOTE — Discharge Instructions (Addendum)
Increase oral fluid intake ?Take medications as prescribed ?If you have worsening shortness of breath, cough or hoarseness of voice please return to urgent care to be reevaluated. ?

## 2021-07-17 ENCOUNTER — Ambulatory Visit (INDEPENDENT_AMBULATORY_CARE_PROVIDER_SITE_OTHER): Payer: Medicare Other | Admitting: Podiatry

## 2021-07-17 ENCOUNTER — Other Ambulatory Visit: Payer: Self-pay

## 2021-07-17 ENCOUNTER — Encounter: Payer: Self-pay | Admitting: Podiatry

## 2021-07-17 ENCOUNTER — Ambulatory Visit (INDEPENDENT_AMBULATORY_CARE_PROVIDER_SITE_OTHER): Payer: Medicare Other

## 2021-07-17 DIAGNOSIS — M7752 Other enthesopathy of left foot: Secondary | ICD-10-CM

## 2021-07-17 DIAGNOSIS — M2012 Hallux valgus (acquired), left foot: Secondary | ICD-10-CM

## 2021-07-22 ENCOUNTER — Encounter: Payer: Self-pay | Admitting: Podiatry

## 2021-07-22 NOTE — Progress Notes (Signed)
?Subjective:  ?Patient ID: Anthony [name], male    DOB: June 25, 1965,  MRN: [id number] ? ?Chief Complaint  ?Patient presents with  ? Foot Pain  ? ? ?56 y.o. male presents with the above complaint.  Patient presents with left first MPJ pain.  Patient states it hurts with ambulation hurts while walking.  Has progressive gotten worse.  He wanted to get it evaluated.  He denies any other acute complaints.  He has not seen anyone as prior to seeing me.  He states is got a little bunion deformity that is causing him pain when hurting.  Pain scale 7 out of 10.  Hurts with ambulation ? ? ?Review of Systems: Negative except as noted in the HPI. Denies N/V/F/Ch. ? ?Past Medical History:  ?Diagnosis Date  ? Allergy   ? Past hx   ? Anemia   ? Anxiety   ? Asthma   ? Cataract   ? removed left- forming on the right  DVT and PE from long travel trip   ? Chronic kidney disease 2006  ? kidney stones   ? Clotting disorder ([place]) 2016  ? PE - small   ? Complication of anesthesia   ? HARD TO WAKE UP AFTER 2ND EYE SURGERY  ? COVID-19 virus infection   ? Depression   ? DVT (deep venous thrombosis) ([place])   ? DUE TO A LONG TRAVEL  ? Fatty liver   ? Genital herpes 05/06/2018  ? H/O renal calculi 2006  ? Headache   ? MIGRAINES  ? History of kidney stones   ? H/O  ? History of methicillin resistant staphylococcus aureus (MRSA)   ? Hyperlipidemia   ? Internal hemorrhoid   ? PE (pulmonary embolism)   ? Peptic ulcer disease   ? Pneumonia   ? h/o  ? PTSD (post-traumatic stress disorder)   ? Sleep apnea   ? DOES NOT USE CPAP CURRENTLY-MASK DOES NOT FIT CORRECTLY [facility]13-20)  ? Spondylosis   ? Tubular adenoma of colon 2011  ? Dr Olevia Perches  ? ? ?Current Outpatient Medications:  ?  acetaminophen (TYLENOL) 500 MG tablet, Take 1,000 mg by mouth every 6 (six) hours as needed for moderate pain., Disp: , Rfl:  ?  albuterol (PROVENTIL HFA;VENTOLIN HFA) 108 (90 Base) MCG/ACT inhaler, Inhale 2 puffs into the lungs every 6 (six)  hours as needed. For shortness of breath, Disp: 1 Inhaler, Rfl: 1 ?  aspirin EC 81 MG tablet, Take 81 mg by mouth daily., Disp: , Rfl:  ?  atorvastatin (LIPITOR) 10 MG tablet, Take 1 tablet (10 mg total) by mouth daily., Disp: 90 tablet, Rfl: 3 ?  benzonatate (TESSALON) 100 MG capsule, Take 2 capsules (200 mg total) by mouth 3 (three) times daily as needed for cough., Disp: 40 capsule, Rfl: 0 ?  Brinzolamide-Brimonidine 1-0.2 % SUSP, Place 1 drop into the left eye 2 (two) times daily. , Disp: , Rfl:  ?  carboxymethylcellulose (REFRESH PLUS) 0.5 % SOLN, Apply to eye., Disp: , Rfl:  ?  Cholecalciferol (VITAMIN D3 PO), Take 1 tablet by mouth daily., Disp: , Rfl:  ?  clotrimazole-betamethasone (LOTRISONE) cream, Use as directed twice per day as needed to the affected area, Disp: 45 g, Rfl: 1 ?  cyclobenzaprine (FLEXERIL) 5 MG tablet, Take 1 tablet (5 mg total) by mouth 3 (three) times daily as needed for muscle spasms., Disp: 15 tablet, Rfl: 0 ?  fluticasone (FLONASE) 50 MCG/ACT  nasal spray, Place into the nose., Disp: , Rfl:  ?  ketoconazole (NIZORAL) 2 % cream, Apply topically., Disp: , Rfl:  ?  ketotifen (ZADITOR) 0.025 % ophthalmic solution, Apply to eye., Disp: , Rfl:  ?  Multiple Vitamin (MULTIVITAMIN) tablet, Take 1 tablet by mouth daily., Disp: , Rfl:  ?  nortriptyline (PAMELOR) 25 MG capsule, Take 1 capsule (25 mg total) by mouth at bedtime., Disp: 30 capsule, Rfl: 5 ?  ondansetron (ZOFRAN-ODT) 8 MG disintegrating tablet, SMARTSIG:1 Tablet(s) By Mouth Every 12 Hours, Disp: , Rfl:  ?  OXcarbazepine (TRILEPTAL) 150 MG tablet, Take 150 mg by mouth as needed (FOR ANXIETY/PTSD)., Disp: , Rfl:  ?  PARoxetine (PAXIL) 40 MG tablet, TAKE ONE-HALF TABLET BY MOUTH DAILY FOR MENTAL HEALTH, Disp: , Rfl:  ?  phenazopyridine (PYRIDIUM) 100 MG tablet, Take 200 mg by mouth 3 (three) times daily., Disp: , Rfl:  ?  sildenafil (VIAGRA) 100 MG tablet, TAKE ONE TABLET BY MOUTH AS INSTRUCTED (TAKE 1 HOUR PRIOR TO SEXUAL ACTIVITY *DO  NOT EXCEED 1 DOSE PER 24 HOUR PERIOD*), Disp: , Rfl:  ?  triamcinolone cream (KENALOG) 0.1 %, Apply topically., Disp: , Rfl:  ?  Ubrogepant 100 MG TABS, TAKE ONE TABLET BY MOUTH DAILY AS NEEDED - NEW MEDICINE FOR MIGRAINE RELIEF, Disp: , Rfl:  ?  valACYclovir (VALTREX) 1000 MG tablet, Take 1/2 tablet by mouth twice daily for 3 days or 1 tablet by mouth every day for 5 days at onset of breakout., Disp: 90 tablet, Rfl: 1 ? ?Social History  ? ?Tobacco Use  ?Smoking Status Never  ?Smokeless Tobacco Never  ? ? ?Allergies  ?Allergen Reactions  ? Aleve [Naproxen] Rash  ?  Patient stated it was years ago  ? Nsaids Other (See Comments)  ?  Bleeding ulcers  ? ?Objective:  ?There were no vitals filed for this visit. ?There is no height or weight on file to calculate BMI. ?Constitutional Well developed. ?Well nourished.  ?Vascular Dorsalis pedis pulses palpable bilaterally. ?Posterior tibial pulses palpable bilaterally. ?Capillary refill normal to all digits.  ?No cyanosis or clubbing noted. ?Pedal hair growth normal.  ?Neurologic Normal speech. ?Oriented to person, place, and time. ?Epicritic sensation to light touch grossly present bilaterally.  ?Dermatologic Nails well groomed and normal in appearance. ?No open wounds. ?No skin lesions.  ?Orthopedic: Pain on palpation left first MPJ.  Mild pain with range of motion of the MPJ.  Pain on the medial eminence.  No deep intra-articular first MPJ pain noted.  No extensor or flexor tendinitis noted.  No pain at the sesamoidal complex.  ? ?Radiographs: 3 views of skeletally mature adult left foot.  There is increasing hypertrophy of the medial eminence.  No osteoarthritic changes noted at the first MPJ.  Sesamoid position is 5 out of 7. ?Assessment:  ? ?1. Hav (hallux abducto valgus), left   ?2. Capsulitis of metatarsophalangeal (MTP) joint of left foot   ? ?Plan:  ?Patient was evaluated and treated and all questions answered. ? 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  Given the amount of pain that he is experiencing I believe he will benefit from a steroid injection help decrease acute inflammatory component associate with pain.  Patient agrees with plan like to proceed with a steroid injection. ?-A steroid injection was performed at left first MPJ using 1% plain Lidocaine and  10 mg of Kenalog. This was  well tolerated. ?-I discussed shoe gear modification with the patient in extensive detail as well. ? ? ?No follow-ups on file.  ?

## 2021-07-29 ENCOUNTER — Ambulatory Visit: Payer: Medicare Other | Admitting: Neurology

## 2021-08-07 ENCOUNTER — Ambulatory Visit: Payer: Federal, State, Local not specified - PPO | Admitting: Podiatry

## 2021-09-04 ENCOUNTER — Other Ambulatory Visit: Payer: Medicare Other

## 2021-09-04 DIAGNOSIS — N4283 Cyst of prostate: Secondary | ICD-10-CM

## 2021-09-05 LAB — PSA: Prostate Specific Ag, Serum: 0.6 ng/mL (ref 0.0–4.0)

## 2021-09-11 ENCOUNTER — Ambulatory Visit (INDEPENDENT_AMBULATORY_CARE_PROVIDER_SITE_OTHER): Payer: Medicare Other | Admitting: Urology

## 2021-09-11 VITALS — BP 138/72 | HR 80 | Ht 67.0 in | Wt 215.0 lb

## 2021-09-11 DIAGNOSIS — N529 Male erectile dysfunction, unspecified: Secondary | ICD-10-CM

## 2021-09-11 DIAGNOSIS — Z125 Encounter for screening for malignant neoplasm of prostate: Secondary | ICD-10-CM | POA: Diagnosis not present

## 2021-09-11 DIAGNOSIS — R300 Dysuria: Secondary | ICD-10-CM

## 2021-09-11 DIAGNOSIS — N4283 Cyst of prostate: Secondary | ICD-10-CM | POA: Diagnosis not present

## 2021-09-11 DIAGNOSIS — R3989 Other symptoms and signs involving the genitourinary system: Secondary | ICD-10-CM

## 2021-09-11 LAB — BLADDER SCAN AMB NON-IMAGING: Scan Result: 5

## 2021-09-11 NOTE — Patient Instructions (Signed)

## 2021-09-11 NOTE — Progress Notes (Signed)
? ?  09/11/2021 ?12:07 PM  ? ?Anthony [name] ?[birth year] ?[id number] ? ?Reason for visit: Follow up dysuria, prostate cyst, ED, PSA screening ? ?HPI: ?56 year old male with history of urinary symptoms of dysuria who was found to have a prostatic cyst, and ultimately underwent TUR of this lesion with me in October 2021.  Pathology showed only cystitis cystica.  This has resolved his burning with urination and other urinary symptoms, and he continues to do well.  He denies any complaints today.  PVR is normal at 5 mL. ? ?He has ED that is responsive to 100 mg sildenafil, and this is prescribed by the [place]. ? ?He would like to continue to follow-up with [place] for routine PSA screening and urinary symptom check.  Most recent PSA was normal at 0.6 in May 2023 which is stable from 0.66 last year, and 0.66 in 2018.  We reviewed the AUA guidelines that recommend screening every 1 to 2 years through age 84. ? ?RTC 1 year PSA, PVR ? ? ?Billey Co, MD ? ?[facility] ?[address], [address] ?[place], [place] [address] ?[phone] ? ? ?

## 2021-09-15 ENCOUNTER — Other Ambulatory Visit: Payer: Self-pay | Admitting: Internal Medicine

## 2021-09-15 NOTE — Telephone Encounter (Signed)
Please refill as per office routine med refill policy (all routine meds to be refilled for 3 mo or monthly (per pt preference) up to one year from last visit, then month to month grace period for 3 mo, then further med refills will have to be denied) ? ?

## 2021-09-26 ENCOUNTER — Encounter: Payer: Self-pay | Admitting: Emergency Medicine

## 2021-09-26 ENCOUNTER — Ambulatory Visit
Admission: EM | Admit: 2021-09-26 | Discharge: 2021-09-26 | Disposition: A | Discharge status: Home / Self Care | Payer: Federal, State, Local not specified - PPO | Attending: Family Medicine | Admitting: Family Medicine

## 2021-09-26 DIAGNOSIS — R350 Frequency of micturition: Secondary | ICD-10-CM

## 2021-09-26 LAB — POCT URINALYSIS DIP (MANUAL ENTRY)
Bilirubin, UA: NEGATIVE
Glucose, UA: NEGATIVE mg/dL
Ketones, POC UA: NEGATIVE mg/dL
Leukocytes, UA: NEGATIVE
Nitrite, UA: NEGATIVE
Protein Ur, POC: NEGATIVE mg/dL
Spec Grav, UA: 1.02 (ref 1.010–1.025)
Urobilinogen, UA: 0.2 E.U./dL
pH, UA: 6 (ref 5.0–8.0)

## 2021-09-26 LAB — POCT FASTING CBG KUC MANUAL ENTRY: POCT Glucose (KUC): 91 mg/dL (ref 70–99)

## 2021-09-26 NOTE — Discharge Instructions (Addendum)
Reduce caffeine intake and replace with non- caffeine beverages such as water. If symptoms persist I would recommend follow-up with urology or Primary Care provider. I also would recommend STD check if symptoms continue.  Your blood sugar was normal here in our clinic

## 2021-09-26 NOTE — ED Provider Notes (Signed)
Anthony [name]    CSN: [id number] Arrival date & time: 09/26/21  1335      History   Chief Complaint Chief Complaint  Patient presents with   Urinary Frequency    HPI Anthony [name] is a 56 y.o. male.   HPI Patient presents today with two day history of urine frequency. He has a history of a prostate cyst and prediabetes. He denies concern for STD. He is denies dysuria. Reports urinating several times per day regardless of fluid intact. He concern for possible UTI as he is traveling this weekend. He reports he had resumed drinking a few soft drinks with caffeine but no significant increased volume.  He is not experiencing flank pain or abdominal pain. Denies hematuria or changes in urine color or clarity. No unexplained weight loss. Past Medical History:  Diagnosis Date   Allergy    Past hx    Anemia    Anxiety    Asthma    Cataract    removed left- forming on the right  DVT and PE from long travel trip    Chronic kidney disease 2006   kidney stones    Clotting disorder ([place]) 2016   PE - small    Complication of anesthesia    HARD TO WAKE UP AFTER 2ND EYE SURGERY   COVID-19 virus infection    Depression    DVT (deep venous thrombosis) (HCC)    DUE TO A LONG TRAVEL   Fatty liver    Genital herpes 05/06/2018   H/O renal calculi 2006   Headache    MIGRAINES   History of kidney stones    H/O   History of methicillin resistant staphylococcus aureus (MRSA)    Hyperlipidemia    Internal hemorrhoid    PE (pulmonary embolism)    Peptic ulcer disease    Pneumonia    h/o   PTSD (post-traumatic stress disorder)    Sleep apnea    DOES NOT USE CPAP CURRENTLY-MASK DOES NOT FIT CORRECTLY [facility]20[facility]   Spondylosis    Tubular adenoma of colon 2011   Dr Olevia Perches    Patient Active Problem List   Diagnosis Date Noted   Chronic chest wall pain 06/26/2021   Ocular hypertension, 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   Unspecified disorder of refraction  05/09/2021   Bilateral impacted cerumen 04/26/2021   Cellulitis of left elbow 11/19/2020   Allergic rhinitis 10/15/2020   Brow ptosis, left 10/15/2020   Bunion of right foot 10/15/2020   Contact with and (suspected) exposure to covid-19 10/15/2020   COVID-19 10/15/2020   Deposits (accretions) on teeth 10/15/2020   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, left eye, stage unspecified 10/15/2020   Hematuria, unspecified 10/15/2020   Inflammatory disease of prostate, unspecified 10/15/2020   Male erectile disorder (CODE) 10/15/2020   Male erectile dysfunction, unspecified 10/15/2020   Nondependent alcohol abuse, continuous drinking behavior 10/15/2020   Obesity 10/15/2020   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   Unspecified ptosis of left eyelid 10/15/2020   Unspecified symptoms and signs involving cognitive functions and awareness 10/15/2020   Other recurrent depressive disorders ([place]) 10/15/2020   Post-traumatic stress disorder, unspecified 10/15/2020   Adjustment disorder, unspecified 10/15/2020   Encounter for dental examination and cleaning without abnormal findings 10/15/2020   Recurrent major depression ([place]) 10/15/2020   Major depressive disorder, recurrent, unspecified ([place]) 10/15/2020   Mood disorder ([place]) 10/15/2020   Dietary counseling and surveillance 10/15/2020   Other specified counseling  10/15/2020   Headache, unspecified 10/15/2020   Herpes simplex 10/15/2020   Migraine with aura, intractable, with status migrainosus 10/15/2020   Migraine without aura, not intractable, with status migrainosus 10/15/2020   Low back pain, unspecified 10/15/2020   Pain in left leg 10/15/2020   Other asthma 10/15/2020   Meralgia paresthetica of left side 07/03/2020   Major depressive disorder, single episode, unspecified 06/25/2020   Leg cramping 09/04/2019   Pain in joint of right elbow 08/03/2019   Lateral epicondylitis of right elbow 08/03/2019   Nephrolithiasis 05/11/2019   Epigastric pain  05/11/2019   Other constipation 05/11/2019   Right sided abdominal pain 05/11/2019   Paresthesia 04/20/2019   Chronic post-traumatic stress disorder 01/14/2019   Left ear hearing loss 01/14/2019   Low back pain 01/14/2019   Intractable migraine with aura without status migrainosus 01/07/2019   Genital herpes 05/06/2018   STD exposure 05/06/2018   Obstructive sleep apnea 02/13/2018   Hyperglycemia 01/24/2018   Pelvic pain 07/10/2017   Exposure to blood or body fluid 07/07/2017   Trigger thumb of right hand 05/21/2017   Trigger finger, right middle finger 03/18/2017   Anxiety 03/18/2017   Chronic tension-type headache, not intractable 02/12/2017   Herpes simplex type II infection 12/02/2016   Dysuria 12/02/2016   Pain of right thumb 11/13/2016   Other chest pain 07/27/2016   Onychomycosis 06/12/2016   Encounter for well adult exam with abnormal findings 06/12/2016   Generalized headache 05/15/2016   Lumbar paraspinal muscle spasm 05/15/2016   History of DVT (deep vein thrombosis) 11/18/2015   Elevated PSA 11/18/2015   Acute pulmonary embolism ([place]) 2016 05/09/2015   Peptic ulcer 05/09/2015   Dyspnea 05/02/2015   DVT (deep venous thrombosis) ([place]) 04/26/2015   Duodenal ulcer 04/25/2015   Positive D dimer 04/23/2015   Internal hemorrhoid, bleeding 04/04/2015   Acute gastrointestinal hemorrhage 04/03/2015   Duodenitis 04/03/2015   Postural dizziness with presyncope 04/03/2015   Microscopic hematuria 04/03/2015   Dislocated IOL (intraocular lens), initial encounter 12/10/2014   MRSA infection 09/14/2013   History of methicillin resistant Staphylococcus aureus infection 02/17/2013   Spondylosis of lumbar joint 09/28/2012   Fatty liver disease, nonalcoholic 52/84/1324   Lumbosacral spondylosis without myelopathy 09/28/2012   Fatty metamorphosis of liver 09/28/2012   Atopic dermatitis, unspecified 05/13/2012   Seasonal and perennial allergic rhinitis 10/01/2011   Family history  of ischemic heart disease 04/02/2011   Low serum testosterone level 03/12/2011   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/12/2011   COLONIC POLYPS, HX OF 06/21/2009   Hyperlipidemia 02/16/2008   Asthma, mild persistent 02/16/2008   NONSPECIFIC ABNORMAL ELECTROCARDIOGRAM 02/16/2008   NEPHROLITHIASIS, HX OF 02/16/2008    Past Surgical History:  Procedure Laterality Date   CATARACT EXTRACTION Left 1996   Dr Elonda Husky; OS   COLONOSCOPY     colonoscopy with polypectomy  2011   Dr Olevia Perches, hemorrhoids   CYSTOSCOPY W/ STONE MANIPULATION  2006   EYE SURGERY Left    INGUINAL HERNIA REPAIR Right 1990   POLYPECTOMY     SHOULDER SURGERY Right 12/17/2010   Dr French Ana ; shoulder impingement & torn tendon   TRANSURETHRAL RESECTION OF PROSTATE N/A 02/23/2020   Procedure: TRANSURETHRAL RESECTION OF THE PROSTATE (TURP);  Surgeon: Billey Co, MD;  Location: [facility] ORS;  Service: Urology;  Laterality: N/A;       Home Medications    Prior to Admission medications   Medication Sig Start Date End Date Taking? Authorizing Provider  acetaminophen (TYLENOL) 500 MG tablet  Take 1,000 mg by mouth every 6 (six) hours as needed for moderate pain.    [provider]  albuterol (PROVENTIL HFA;VENTOLIN HFA) 108 (90 Base) MCG/ACT inhaler Inhale 2 puffs into the lungs every 6 (six) hours as needed. For shortness of breath 06/29/16   Martinique, Betty G, MD  aspirin EC 81 MG tablet Take 81 mg by mouth daily.    [provider]  atorvastatin (LIPITOR) 10 MG tablet TAKE 1 TABLET BY MOUTH EVERY DAY 09/15/21   Biagio Borg, MD  benzonatate (TESSALON) 100 MG capsule Take 2 capsules (200 mg total) by mouth 3 (three) times daily as needed for cough. 07/09/21   Lamptey, Myrene Galas, MD  Brinzolamide-Brimonidine 1-0.2 % SUSP Place 1 drop into the left eye 2 (two) times daily.     [provider]  carboxymethylcellulose (REFRESH PLUS) 0.5 % SOLN Apply to eye. 11/15/20   [provider]  Cholecalciferol (VITAMIN D3 PO)  Take 1 tablet by mouth daily.    [provider]  clotrimazole-betamethasone (LOTRISONE) cream Use as directed twice per day as needed to the affected area 12/27/20   Biagio Borg, MD  cyclobenzaprine (FLEXERIL) 5 MG tablet Take 1 tablet (5 mg total) by mouth 3 (three) times daily as needed for muscle spasms. 01/12/21   Jearld Fenton, NP  fluticasone (FLONASE) 50 MCG/ACT nasal spray Place into the nose. 07/12/20   [provider]  ketoconazole (NIZORAL) 2 % cream Apply topically. 07/12/20   [provider]  ketotifen (ZADITOR) 0.025 % ophthalmic solution Apply to eye. 11/15/20   [provider]  Multiple Vitamin (MULTIVITAMIN) tablet Take 1 tablet by mouth daily.    [provider]  nortriptyline (PAMELOR) 25 MG capsule Take 1 capsule (25 mg total) by mouth at bedtime. 05/19/21   Pieter Partridge, DO  ondansetron (ZOFRAN-ODT) 8 MG disintegrating tablet SMARTSIG:1 Tablet(s) By Mouth Every 12 Hours 07/03/21   [provider]  OXcarbazepine (TRILEPTAL) 150 MG tablet Take 150 mg by mouth as needed (FOR ANXIETY/PTSD).    [provider]  PARoxetine (PAXIL) 40 MG tablet TAKE ONE-HALF TABLET BY MOUTH DAILY FOR MENTAL HEALTH 01/01/20   [provider]  phenazopyridine (PYRIDIUM) 100 MG tablet Take 200 mg by mouth 3 (three) times daily. 01/10/21   [provider]  sildenafil (VIAGRA) 100 MG tablet TAKE ONE TABLET BY MOUTH AS INSTRUCTED (TAKE 1 HOUR PRIOR TO SEXUAL ACTIVITY *DO NOT EXCEED 1 DOSE PER 24 HOUR PERIOD*) 07/12/20   [provider]  triamcinolone cream (KENALOG) 0.1 % Apply topically. 09/06/20   [provider]  Ubrogepant 100 MG TABS TAKE ONE TABLET BY MOUTH DAILY AS NEEDED - NEW MEDICINE FOR MIGRAINE RELIEF 02/17/21   [provider]  valACYclovir (VALTREX) 1000 MG tablet Take 1/2 tablet by mouth twice daily for 3 days or 1 tablet by mouth every day for 5 days at onset of breakout. 04/24/21   Biagio Borg, MD  omeprazole (PRILOSEC) 20 MG capsule Take 1 capsule (20 mg total) by mouth daily. 04/03/20 04/13/20  Faustino Congress, NP  topiramate (TOPAMAX) 25 MG tablet topiramate 25 mg tablet Patient not taking: No sig reported  04/13/20  [provider]    Family History Family History  Problem Relation Age of Onset   Heart attack Mother 40   Dementia Father    Stroke Father 42   Diabetes Father        PVD   Glaucoma Father  blindness   Dementia Maternal Aunt    Heart attack Maternal Uncle         X2,pre 55   Esophageal cancer Maternal Uncle    Heart attack Maternal Grandmother 73   Cancer Paternal Grandmother        ? primary   Asthma Neg Hx    COPD Neg Hx    Colon cancer Neg Hx    Colon polyps Neg Hx    Stomach cancer Neg Hx    Rectal cancer Neg Hx     Social History Social History   Tobacco Use   Smoking status: Never   Smokeless tobacco: Never  Vaping Use   Vaping Use: Never used  Substance Use Topics   Alcohol use: Yes    Alcohol/week: 0.0 standard drinks    Comment: Socially , < 2X/ month   Drug use: No     Allergies   Aleve [naproxen] and Nsaids   Review of Systems Review of Systems Pertinent negatives listed in HPI   Physical Exam Triage Vital Signs ED Triage Vitals  Enc Vitals Group     BP 09/26/21 1410 130/87     Pulse Rate 09/26/21 1410 (!) 109     Resp 09/26/21 1410 18     Temp 09/26/21 1410 98.2 F (36.8 C)     Temp Source 09/26/21 1410 Oral     SpO2 09/26/21 1410 100 %     Weight --      Height --      Head Circumference --      Peak Flow --      Pain Score 09/26/21 1409 0     Pain Loc --      Pain Edu? --      Excl. in [place]? --    No data found.  Updated Vital Signs BP 130/87 (BP Location: Left Arm)   Pulse (!) 109   Temp 98.2 F (36.8 C) (Oral)   Resp 18   SpO2 100%   Visual Acuity Right Eye Distance:   Left Eye Distance:   Bilateral Distance:    Right Eye Near:   Left Eye Near:    Bilateral Near:      Physical Exam Constitutional:      Appearance: Normal appearance.  HENT:     Head: Normocephalic and atraumatic.  Eyes:     Extraocular Movements: Extraocular movements intact.     Pupils: Pupils are equal, round, and reactive to light.  Cardiovascular:     Rate and Rhythm: Normal rate and regular rhythm.  Pulmonary:     Effort: Pulmonary effort is normal.     Breath sounds: Normal breath sounds.  Abdominal:     Tenderness: There is no right CVA tenderness or left CVA tenderness.  Skin:    General: Skin is warm and dry.     Capillary Refill: Capillary refill takes less than 2 seconds.  Neurological:     General: No focal deficit present.     Mental Status: He is alert.  Psychiatric:        Mood and Affect: Mood normal.        Behavior: Behavior normal.     UC Treatments / Results  Labs (all labs ordered are listed, but only abnormal results are displayed) Labs Reviewed  POCT URINALYSIS DIP (MANUAL ENTRY) - Abnormal; Notable for the following components:      Result Value   Blood, UA small (*)    All other components  within normal limits  POCT FASTING [facility] ENTRY    EKG   Radiology No results found.  Procedures Procedures (including critical care time)  Medications Ordered in UC Medications - No data to display  Initial Impression / Assessment and Plan / UC Course  I have reviewed the triage vital signs and the nursing notes.  Pertinent labs & imaging results that were available during my care of the patient were reviewed by me and considered in my medical decision making (see chart for details).    Urinary frequency, unknown cause. UA unremarkable with the exception of mild elevated SPG indicating mild dehydration.  Blood glucose 91-unremarkable. Patient declines STD testing. Recommended follow-up with urology or PCP if symptoms persistent and advised STD testing recommended to rule out cause of symptom. RTC PRN Final Clinical Impressions(s) / UC  Diagnoses   Final diagnoses:  Urinary frequency     Discharge Instructions      Reduce caffeine intake and replace with non- caffeine beverages such as water. If symptoms persist I would recommend follow-up with urology or Primary Care provider. I also would recommend STD check if symptoms continue.  Your blood sugar was normal here in our clinic      ED Prescriptions   None    PDMP not reviewed this encounter.   Scot Jun, [place] 09/27/21 [phone]

## 2021-09-26 NOTE — ED Triage Notes (Signed)
Pt presents with urinary frequency x 2-3 days

## 2021-09-27 ENCOUNTER — Encounter: Payer: Self-pay | Admitting: Internal Medicine

## 2021-10-09 ENCOUNTER — Ambulatory Visit (INDEPENDENT_AMBULATORY_CARE_PROVIDER_SITE_OTHER): Payer: Medicare Other | Admitting: Internal Medicine

## 2021-10-09 ENCOUNTER — Encounter: Payer: Self-pay | Admitting: Urology

## 2021-10-09 ENCOUNTER — Encounter: Payer: Self-pay | Admitting: Internal Medicine

## 2021-10-09 ENCOUNTER — Ambulatory Visit (INDEPENDENT_AMBULATORY_CARE_PROVIDER_SITE_OTHER): Payer: Medicare Other | Admitting: Urology

## 2021-10-09 VITALS — BP 124/83 | HR 78 | Ht 67.5 in | Wt 216.0 lb

## 2021-10-09 VITALS — BP 124/76 | HR 90 | Temp 97.8°F | Ht 67.5 in | Wt 217.0 lb

## 2021-10-09 DIAGNOSIS — R3915 Urgency of urination: Secondary | ICD-10-CM

## 2021-10-09 DIAGNOSIS — J45998 Other asthma: Secondary | ICD-10-CM | POA: Diagnosis not present

## 2021-10-09 DIAGNOSIS — R300 Dysuria: Secondary | ICD-10-CM

## 2021-10-09 DIAGNOSIS — F419 Anxiety disorder, unspecified: Secondary | ICD-10-CM

## 2021-10-09 DIAGNOSIS — R000 Tachycardia, unspecified: Secondary | ICD-10-CM | POA: Diagnosis not present

## 2021-10-09 DIAGNOSIS — K0262 Dental caries on smooth surface penetrating into dentin: Secondary | ICD-10-CM | POA: Insufficient documentation

## 2021-10-09 DIAGNOSIS — J3089 Other allergic rhinitis: Secondary | ICD-10-CM

## 2021-10-09 DIAGNOSIS — R350 Frequency of micturition: Secondary | ICD-10-CM

## 2021-10-09 DIAGNOSIS — J302 Other seasonal allergic rhinitis: Secondary | ICD-10-CM

## 2021-10-09 LAB — URINALYSIS, COMPLETE
Bilirubin, UA: NEGATIVE
Glucose, UA: NEGATIVE
Ketones, UA: NEGATIVE
Leukocytes,UA: NEGATIVE
Nitrite, UA: NEGATIVE
Protein,UA: NEGATIVE
Specific Gravity, UA: 1.03 (ref 1.005–1.030)
Urobilinogen, Ur: 0.2 mg/dL (ref 0.2–1.0)
pH, UA: 6 (ref 5.0–7.5)

## 2021-10-09 LAB — MICROSCOPIC EXAMINATION: Bacteria, UA: NONE SEEN

## 2021-10-09 NOTE — Patient Instructions (Signed)
Urethritis, Adult  Urethritis is a swelling (inflammation) of the urethra. The urethra is the tube that drains urine from the bladder. It is important to get treatment for this condition early. Delayed treatment may lead to complications, such as an infection in the urinary tract (ureters, kidneys, and bladder). What are the causes? This condition may be caused by: Germs that are spread through sexual contact. This is the leading cause of urethritis. This may include bacterial or viral infections. Injury to the urethra. This can happen after a thin, flexible tube (catheter) is inserted into the urethra to drain urine, or after medical instruments or foreign bodies are inserted into the area. Chemical irritation. This may include contact with spermicide or prolonged contact with chemicals in bubble bath, shampoo, or perfumed soaps. A disease that causes inflammation. This is rare. What increases the risk? The following factors may make you more likely to develop this condition: Having sex without using a condom. Having multiple sexual partners. Having poor hygiene. What are the signs or symptoms? Symptoms of this condition include: Pain with urination. Frequent urination. An urgent need to urinate. Itching and pain in the vagina or penis. Discharge or bleeding coming from the penis. Most women have no symptoms. How is this diagnosed? This condition may be diagnosed based on: Your symptoms. Your medical history. A physical exam. Tests may also be done. These may include: Urine tests. Swabs from the urethra. How is this treated? Treatment for this condition depends on the cause. Urethritis caused by a bacterial infection is treated with antibiotic medicine. Sexual partners must also be treated. Follow these instructions at home: Medicines Take over-the-counter and prescription medicines only as told by your health care provider. If you were prescribed an antibiotic, take it as told  by your health care provider. Do not stop taking the antibiotic even if you start to feel better. Lifestyle Avoid using perfumed soaps, bubble bath, and shampoo when you bathe or shower. Rinse the vaginal area after bathing. Wear cotton underwear. Not wearing underwear when going to sleep can help. Make sure to wipe from front to back after using the toilet if you are male. Do not have sex until your health care provider approves. When you do have sex, be sure to practice safe sex. Any sexual partners you have had in the past 60 days should be treated. General instructions Drink enough fluid to keep your urine pale yellow. It is up to you to get your test results. Ask your health care provider, or the department that is doing the test, when your results will be ready. Keep all follow-up visits. This is important. Get tested again 3 months after treatment to make sure the infection is gone. It is important that your sexual partner also gets tested again. Contact a health care provider if: Your symptoms have not improved after 3 days. Your symptoms get worse. You have eye redness or pain. You develop abdominal pain or pelvic pain (in females). You develop joint pain or a rash. You have a fever or chills. Get help right away if: You have severe pain in the belly, back, or side. You vomit repeatedly. Summary Urethritis is a swelling (inflammation) of the urethra. Germs that are spread through sexual contact are the most common cause of this condition. It is important to get treatment for this condition early. Delayed treatment may lead to complications. Treatment for this condition depends on the cause. Any sexual partners must also be treated. This information is not   intended to replace advice given to you by your health care provider. Make sure you discuss any questions you have with your health care provider. Document Revised: 11/26/2019 Document Reviewed: 11/26/2019 Elsevier Patient  Education  2023 [employer]  

## 2021-10-09 NOTE — Progress Notes (Signed)
   10/09/2021 10:17 AM   Anthony [name] 1965/08/31 [id number]  Reason for visit: Dysuria, history of prostate cyst, ED, PSA screening  HPI: 56 year old male with history of urinary symptoms of dysuria who was found to have a prostatic cyst, and ultimately underwent TUR of this lesion with me in October 2021.  Pathology showed only cystitis cystica.  This had resolved his burning with urination and other urinary symptoms.  I recently saw him on 09/11/2021 and he was doing well.  He reports acute onset of dysuria and urgency/frequency, with urinary frequency every 30 minutes on 09/26/2021 which prompted an urgent care visit.  Only a dipstick urinalysis was performed that showed blood, but no microscopic.  He was on a trip to [place] and had persistent symptoms and was seen in an urgent care there, and reportedly diagnosed with a UTI, but none of those results are available to me.  He was started on Cipro.  He feels like this has improved his symptoms significantly.  He really denies any urinary complaints today.  He denies any new sexual partners, no sexual activity in the last month, he refused STD testing at both urgent care visits, and again today.  Urinalysis today is pending and will call with results.  We reviewed possible etiologies include urethritis, inflammation, UTI, irritation from soaps/lotions, or idiopathic.  Reassurance provided.  Call with urinalysis results Keep scheduled follow-up in 1 year for ongoing PSA screening   Anthony [name], [place] [address] [place], [place] [address] [phone]

## 2021-10-09 NOTE — Patient Instructions (Addendum)
Your EKG was done today  Please continue all other medications as before, and refills have been done if requested.  Please have the pharmacy call with any other refills you may need.  Please continue your efforts at being more active, low cholesterol diet, and weight control.  Please keep your appointments with your specialists as you may have planned  You will be contacted regarding the referral for: Heart monitor testing and cardiology

## 2021-10-09 NOTE — Progress Notes (Unsigned)
Patient ID: Anthony [name], male   DOB: 1965-09-24, 56 y.o.   MRN: [id number]        Chief Complaint: follow up elevated HR, anxiety, asthma, allergies       HPI:  Anthony [name] is a 56 y.o. male here with c/o following himself at random times with an oximeter check over the past 3 months, and has determined has has intermittent episodes of tachycardia up to 140 bpm essentially asymptomatic - Pt denies chest pain, increased sob or doe, wheezing, orthopnea, PND, increased LE swelling, palpitations, dizziness or syncope.   Pt denies polydipsia, polyuria, or new focal neuro s/s.    Pt denies fever, wt loss, night sweats, loss of appetite, or other constitutional symptoms  Denies worsening depressive symptoms, suicidal ideation, or panic; has ongoing anxiety tx with buspar 10 bid prn.  Pt very concerned, asking for cards referral and any other testing.   Has some mild worsening allergy symptoms recently.         Wt Readings from Last 3 Encounters:  10/09/21 217 lb (98.4 kg)  10/09/21 216 lb (98 kg)  09/11/21 215 lb (97.5 kg)   BP Readings from Last 3 Encounters:  10/09/21 124/76  10/09/21 124/83  09/26/21 130/87         Past Medical History:  Diagnosis Date   Allergy    Past hx    Anemia    Anxiety    Asthma    Cataract    removed left- forming on the right  DVT and PE from long travel trip    Chronic kidney disease 2006   kidney stones    Clotting disorder ([place]) 2016   PE - small    Complication of anesthesia    HARD TO WAKE UP AFTER 2ND EYE SURGERY   COVID-19 virus infection    Depression    DVT (deep venous thrombosis) ([place])    DUE TO A LONG TRAVEL   Fatty liver    Genital herpes 05/06/2018   H/O renal calculi 2006   Headache    MIGRAINES   History of kidney stones    H/O   History of methicillin resistant staphylococcus aureus (MRSA)    Hyperlipidemia    Internal hemorrhoid    PE (pulmonary embolism)    Peptic ulcer disease    Pneumonia    h/o   PTSD (post-traumatic  stress disorder)    Sleep apnea    DOES NOT USE CPAP CURRENTLY-MASK DOES NOT FIT CORRECTLY [facility]13[facility]20[facility]   Spondylosis    Tubular adenoma of colon 2011   Dr Olevia Perches   Past Surgical History:  Procedure Laterality Date   CATARACT EXTRACTION Left 1996   Dr Elonda Husky; OS   COLONOSCOPY     colonoscopy with polypectomy  2011   Dr Olevia Perches, hemorrhoids   CYSTOSCOPY W/ STONE MANIPULATION  2006   [place] Right 12/17/2010   Dr French Ana ; shoulder impingement & torn tendon   TRANSURETHRAL RESECTION OF PROSTATE N/A 02/23/2020   Procedure: TRANSURETHRAL RESECTION OF THE PROSTATE (TURP);  Surgeon: Billey Co, MD;  Location: [facility] ORS;  Service: Urology;  Laterality: N/A;    reports that he has never smoked. He has never used smokeless tobacco. He reports current alcohol use. He reports that he does not use drugs. family history includes Cancer in  his paternal grandmother; Dementia in his father and maternal aunt; Diabetes in his father; Esophageal cancer in his maternal uncle; Glaucoma in his father; Heart attack in his maternal uncle; Heart attack (age of onset: 31) in his mother; Heart attack (age of onset: 18) in his maternal grandmother; Stroke (age of onset: 41) in his father. Allergies  Allergen Reactions   Aleve [Naproxen] Rash    Patient stated it was years ago   Nsaids Other (See Comments)    Bleeding ulcers   Current Outpatient Medications on File Prior to Visit  Medication Sig Dispense Refill   acetaminophen (TYLENOL) 500 MG tablet Take 1,000 mg by mouth every 6 (six) hours as needed for moderate pain.     albuterol (PROVENTIL HFA;VENTOLIN HFA) 108 (90 Base) MCG/ACT inhaler Inhale 2 puffs into the lungs every 6 (six) hours as needed. For shortness of breath 1 Inhaler 1   amoxicillin-clavulanate (AUGMENTIN) 875-125 MG tablet TAKE 1 TABLET BY MOUTH TWICE A DAY FOR SINUSITIS.  (TAKE WITH FOOD)     aspirin EC 81 MG tablet Take 81 mg by mouth daily.     atorvastatin (LIPITOR) 10 MG tablet TAKE 1 TABLET BY MOUTH EVERY DAY 90 tablet 3   Brinzolamide-Brimonidine 1-0.2 % SUSP Place 1 drop into the left eye 2 (two) times daily.      busPIRone (BUSPAR) 10 MG tablet TAKE ONE TABLET BY MOUTH TWICE A DAY FOR ANXIETY     carboxymethylcellulose (REFRESH PLUS) 0.5 % SOLN Apply to eye.     cetirizine (ZYRTEC) 10 MG tablet Take 1 tablet by mouth daily as needed.     Cholecalciferol (VITAMIN D3 PO) Take 1 tablet by mouth daily.     ciprofloxacin (CIPRO) 500 MG tablet SMARTSIG:1 Tablet(s) By Mouth Every 12 Hours     clotrimazole-betamethasone (LOTRISONE) cream Use as directed twice per day as needed to the affected area 45 g 1   dextromethorphan-guaiFENesin (ROBITUSSIN-DM) 10-100 MG/5ML liquid TAKE 2 TEASPOONFULS BY MOUTH EVERY 6 HOURS AS NEEDED FOR COUGH     fluticasone (FLONASE) 50 MCG/ACT nasal spray Place into the nose.     fluticasone-salmeterol (ADVAIR) 250-50 MCG/ACT AEPB INHALE 1 INHALATION BY MOUTH TWICE A DAY (RINSE MOUTH WELL WITH WATER AFTER EACH USE) FOR BREATHING     ketoconazole (NIZORAL) 2 % cream Apply topically.     ketotifen (ZADITOR) 0.025 % ophthalmic solution Apply to eye.     Multiple Vitamin (MULTIVITAMIN) tablet Take 1 tablet by mouth daily.     nortriptyline (PAMELOR) 25 MG capsule Take 1 capsule (25 mg total) by mouth at bedtime. 30 capsule 5   OXcarbazepine (TRILEPTAL) 150 MG tablet Take 150 mg by mouth as needed (FOR ANXIETY/PTSD).     PARoxetine (PAXIL) 40 MG tablet TAKE ONE-HALF TABLET BY MOUTH DAILY FOR MENTAL HEALTH     phenazopyridine (PYRIDIUM) 200 MG tablet Take 200 mg by mouth 3 (three) times daily as needed.     riboflavin (VITAMIN B-2) 100 MG TABS tablet TAKE FOUR TABLETS BY MOUTH DAILY FOR MIGRAINE PREVENTION     sildenafil (VIAGRA) 100 MG tablet TAKE ONE TABLET BY MOUTH AS INSTRUCTED AS NEEDED ERECTILE DYSFUNCTION (TAKE 1 HOUR PRIOR TO SEXUAL  ACTIVITY *DO NOT EXCEED 1 DOSE PER 24 HOUR PERIOD*)     tiZANidine (ZANAFLEX) 4 MG tablet TAKE ONE TABLET BY MOUTH AT BEDTIME FOR MUSCLE PAIN, SPASM; MAXIMUM 1 PILL A DAY; NO DRIVING FOR 8 HOURS     triamcinolone cream (KENALOG) 0.1 % Apply topically.  Ubrogepant 100 MG TABS TAKE ONE TABLET BY MOUTH DAILY AS NEEDED - NEW MEDICINE FOR MIGRAINE RELIEF     valACYclovir (VALTREX) 1000 MG tablet Take 1/2 tablet by mouth twice daily for 3 days or 1 tablet by mouth every day for 5 days at onset of breakout. 90 tablet 1   [DISCONTINUED] omeprazole (PRILOSEC) 20 MG capsule Take 1 capsule (20 mg total) by mouth daily. 30 capsule 0   [DISCONTINUED] topiramate (TOPAMAX) 25 MG tablet topiramate 25 mg tablet (Patient not taking: No sig reported)     No current facility-administered medications on file prior to visit.        ROS:  All others reviewed and negative.  Objective        PE:  BP 124/76 (BP Location: Right Arm, Patient Position: Sitting, Cuff Size: Large)   Pulse 90   Temp 97.8 F (36.6 C) (Oral)   Ht 5' 7.5" (1.715 m)   Wt 217 lb (98.4 kg)   SpO2 94%   BMI 33.49 kg/m                 Constitutional: Pt appears in NAD               HENT: Head: NCAT.                Right Ear: External ear normal.                 Left Ear: External ear normal.                Eyes: . Pupils are equal, round, and reactive to light. Conjunctivae and EOM are normal               Nose: without d/c or deformity               Neck: Neck supple. Gross normal ROM               Cardiovascular: Normal rate and regular rhythm.                 Pulmonary/Chest: Effort normal and breath sounds without rales or wheezing.                Abd:  Soft, NT, ND, + BS, no organomegaly               Neurological: Pt is alert. At baseline orientation, motor grossly intact               Skin: Skin is warm. No rashes, no other new lesions, LE edema - none               Psychiatric: Pt behavior is normal without agitation    Micro: none  Cardiac tracings I have personally interpreted today:  ECG - NSR 74  Pertinent Radiological findings (summarize): none   Lab Results  Component Value Date   WBC 6.4 06/26/2021   HGB 14.4 06/26/2021   HCT 44.3 06/26/2021   PLT 248.0 06/26/2021   GLUCOSE 80 06/26/2021   CHOL 145 06/26/2021   TRIG 151.0 (H) 06/26/2021   HDL 51.00 06/26/2021   LDLDIRECT 89.0 04/20/2019   LDLCALC 64 06/26/2021   ALT 32 06/26/2021   AST 23 06/26/2021   NA 140 06/26/2021   K 4.1 06/26/2021   CL 106 06/26/2021   CREATININE 1.10 06/26/2021   BUN 13 06/26/2021   CO2 26 06/26/2021   TSH 0.81 06/26/2021   PSA 0.46  06/26/2021   HGBA1C 6.1 06/26/2021   Assessment/Plan:  Anthony [name] is a 56 y.o. Black or African American [2] male with  has a past medical history of Allergy, Anemia, Anxiety, Asthma, Cataract, Chronic kidney disease (2006), Clotting disorder ([place]) (0349), Complication of anesthesia, COVID-19 virus infection, Depression, DVT (deep venous thrombosis) ([place]), Fatty liver, Genital herpes (05/06/2018), H/O renal calculi (2006), Headache, History of kidney stones, History of methicillin resistant staphylococcus aureus (MRSA), Hyperlipidemia, Internal hemorrhoid, PE (pulmonary embolism), Peptic ulcer disease, Pneumonia, PTSD (post-traumatic stress disorder), Sleep apnea, Spondylosis, and Tubular adenoma of colon (2011).  Other asthma Stable overall,  Cont albuterol hfa prn  Anxiety Chronic persistent, declines change in tx or referral for counseling  Seasonal and perennial allergic rhinitis Mild, to restart zyrtec 10 qd prn  Tachycardia Etiology unclear, ECG reveiwed, cant r/o PAF - for card event monitor, for card referral per pt request  Followup: Return if symptoms worsen or fail to improve.  Cathlean Cower, MD 10/11/2021 5:23 PM [facility]

## 2021-10-10 ENCOUNTER — Encounter: Payer: Self-pay | Admitting: Internal Medicine

## 2021-10-11 ENCOUNTER — Encounter: Payer: Self-pay | Admitting: Internal Medicine

## 2021-10-11 DIAGNOSIS — R000 Tachycardia, unspecified: Secondary | ICD-10-CM | POA: Insufficient documentation

## 2021-10-11 NOTE — Assessment & Plan Note (Signed)
Stable overall,  Cont albuterol hfa prn

## 2021-10-11 NOTE — Assessment & Plan Note (Signed)
Mild, to restart zyrtec 10 qd prn

## 2021-10-11 NOTE — Assessment & Plan Note (Signed)
Etiology unclear, ECG reveiwed, cant r/o PAF - for card event monitor, for card referral per pt request

## 2021-10-11 NOTE — Assessment & Plan Note (Signed)
Chronic persistent, declines change in tx or referral for counseling

## 2021-10-17 ENCOUNTER — Ambulatory Visit (INDEPENDENT_AMBULATORY_CARE_PROVIDER_SITE_OTHER): Payer: Medicare Other

## 2021-10-17 ENCOUNTER — Telehealth: Payer: Self-pay

## 2021-10-17 DIAGNOSIS — R000 Tachycardia, unspecified: Secondary | ICD-10-CM | POA: Diagnosis not present

## 2021-10-17 NOTE — Telephone Encounter (Signed)
Returned call to patient and answered all of his questions. Patient was able to apply his preventice monitor earlier today by himself. He knows to call us or the company if he has anymore follow up questions.

## 2021-10-17 NOTE — Telephone Encounter (Signed)
Anthony [name] received a [employer] Monitor and would like to know if he is able to come in to have someone to apply it. Please advise

## 2021-10-31 NOTE — Progress Notes (Unsigned)
Cardiology Office Note:    Date:  11/03/2021   ID:  Anthony [name], DOB [birth year], MRN [id number]  PCP:  Biagio Borg, MD   [facility] [place] [facility] Cardiologist:  Lenna Sciara, MD Referring MD: Biagio Borg, MD   Chief Complaint/Reason for Referral: Tachycardia  ASSESSMENT:    1. Tachycardia, unspecified   2. Hyperlipidemia, unspecified hyperlipidemia type   3. BMI 33.0-33.9,adult     PLAN:    In order of problems listed above: 1.  Tachycardia: We will follow-up monitor results and obtain an echocardiogram.  We will check reflex TSH, CBC, and CMP.  We will keep follow-up open-ended particularly if these results are reassuring.  I encouraged him to continue regular exercise. 2.  Hyperlipidemia: This is being followed with the patient's primary care provider. 3.  Elevated BMI: This is managed by the patient's primary care provider.  I encouraged regular exercise.    Dispo:  Return if symptoms worsen or fail to improve.      Medication Adjustments/Labs and Tests Ordered: Current medicines are reviewed at length with the patient today.  Concerns regarding medicines are outlined above.  The following changes have been made:  no change   Labs/tests ordered: Orders Placed This Encounter  Procedures   TSH Rfx on Abnormal to Free T4   CBC   Comp Met (CMET)   EKG 12-Lead   ECHOCARDIOGRAM COMPLETE    Medication Changes: No orders of the defined types were placed in this encounter.    Current medicines are reviewed at length with the patient today.  The patient does not have concerns regarding medicines.   History of Present Illness:    FOCUSED PROBLEM LIST:   1.  Hyperlipidemia 2.  Obstructive sleep apnea not on CPAP 3.  History of DVT 2016 4.  BMI of 33  The patient is a 56 y.o. male with the indicated medical history here for recommendations regarding tachycardia.  The patient was seen in his primary care provider's office recently.  He had complaints of a  high heartbeat on his oximeter at times.  His heart rate and his PCPs office was abnormal at 90.  He was concerned and requested cardiology evaluation.  No laboratories were drawn.  He was referred for monitor.  The patient has been well.  He exercises on a regular basis.  He denies any exertional angina, exertional dyspnea, palpitations, paroxysmal nocturnal dyspnea, or orthopnea.  He has had no severe bleeding or bruising.  He is not required emergency room visits or hospitalizations.  He is otherwise well and without complaints.  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       Previous Medical History:   Current Medications: Current Meds  Medication Sig   acetaminophen (TYLENOL) 500 MG tablet Take 1,000 mg by mouth every 6 (six) hours as needed for moderate pain.   albuterol (PROVENTIL HFA;VENTOLIN HFA) 108 (90 Base) MCG/ACT inhaler Inhale 2 puffs into the lungs every 6 (six) hours as needed. For shortness of breath   amoxicillin-clavulanate (AUGMENTIN) 875-125 MG tablet TAKE 1 TABLET BY MOUTH TWICE A DAY FOR SINUSITIS. (TAKE WITH FOOD)   aspirin EC 81 MG tablet Take 81 mg by mouth daily.   atorvastatin (LIPITOR) 10 MG tablet TAKE 1 TABLET BY MOUTH EVERY DAY   Brinzolamide-Brimonidine 1-0.2 % SUSP Place 1 drop into the left eye 2 (two) times daily.    busPIRone (BUSPAR) 10 MG tablet TAKE ONE TABLET BY MOUTH  TWICE A DAY FOR ANXIETY   carboxymethylcellulose (REFRESH PLUS) 0.5 % SOLN Apply to eye.   cetirizine (ZYRTEC) 10 MG tablet Take 1 tablet by mouth daily as needed.   Cholecalciferol (VITAMIN D3 PO) Take 1 tablet by mouth daily.   ciprofloxacin (CIPRO) 500 MG tablet SMARTSIG:1 Tablet(s) By Mouth Every 12 Hours   clotrimazole-betamethasone (LOTRISONE) cream Use as directed twice per day as needed to the affected area   dextromethorphan-guaiFENesin (ROBITUSSIN-DM) 10-100 MG/5ML liquid TAKE 2 TEASPOONFULS BY MOUTH EVERY 6 HOURS AS NEEDED FOR COUGH   fluticasone (FLONASE)  50 MCG/ACT nasal spray Place into the nose.   fluticasone-salmeterol (ADVAIR) 250-50 MCG/ACT AEPB INHALE 1 INHALATION BY MOUTH TWICE A DAY (RINSE MOUTH WELL WITH WATER AFTER EACH USE) FOR BREATHING   ketoconazole (NIZORAL) 2 % cream Apply topically.   ketotifen (ZADITOR) 0.025 % ophthalmic solution Apply to eye.   Multiple Vitamin (MULTIVITAMIN) tablet Take 1 tablet by mouth daily.   nortriptyline (PAMELOR) 25 MG capsule Take 1 capsule (25 mg total) by mouth at bedtime.   OXcarbazepine (TRILEPTAL) 150 MG tablet Take 150 mg by mouth as needed (FOR ANXIETY/PTSD).   PARoxetine (PAXIL) 40 MG tablet TAKE ONE-HALF TABLET BY MOUTH DAILY FOR MENTAL HEALTH   phenazopyridine (PYRIDIUM) 200 MG tablet Take 200 mg by mouth 3 (three) times daily as needed.   riboflavin (VITAMIN B-2) 100 MG TABS tablet TAKE FOUR TABLETS BY MOUTH DAILY FOR MIGRAINE PREVENTION   sildenafil (VIAGRA) 100 MG tablet TAKE ONE TABLET BY MOUTH AS INSTRUCTED AS NEEDED ERECTILE DYSFUNCTION (TAKE 1 HOUR PRIOR TO SEXUAL ACTIVITY *DO NOT EXCEED 1 DOSE PER 24 HOUR PERIOD*)   tiZANidine (ZANAFLEX) 4 MG tablet TAKE ONE TABLET BY MOUTH AT BEDTIME FOR MUSCLE PAIN, SPASM; MAXIMUM 1 PILL A DAY; NO DRIVING FOR 8 HOURS   triamcinolone cream (KENALOG) 0.1 % Apply topically.   Ubrogepant 100 MG TABS TAKE ONE TABLET BY MOUTH DAILY AS NEEDED - NEW MEDICINE FOR MIGRAINE RELIEF   valACYclovir (VALTREX) 1000 MG tablet Take 1/2 tablet by mouth twice daily for 3 days or 1 tablet by mouth every day for 5 days at onset of breakout.     Allergies:    Aleve [naproxen] and Nsaids   Social History:   Social History   Tobacco Use   Smoking status: Never   Smokeless tobacco: Never  Vaping Use   Vaping Use: Never used  Substance Use Topics   Alcohol use: Yes    Alcohol/week: 0.0 standard drinks of alcohol    Comment: Socially , < 2X/ month   Drug use: No     Family Hx: Family History  Problem Relation Age of Onset   Heart attack Mother 18   Dementia  Father    Stroke Father 49   Diabetes Father        PVD   Glaucoma Father        blindness   Dementia Maternal Aunt    Heart attack Maternal Uncle         X2,pre 55   Esophageal cancer Maternal Uncle    Heart attack Maternal Grandmother 73   Cancer Paternal Grandmother        ? primary   Asthma Neg Hx    COPD Neg Hx    Colon cancer Neg Hx    Colon polyps Neg Hx    Stomach cancer Neg Hx    Rectal cancer Neg Hx      Review of Systems:   Please  see the history of present illness.    All other systems reviewed and are negative.     EKGs/Labs/Other Test Reviewed:    EKG:  EKG performed May 2023 that I personally reviewed demonstrates sinus rhythm; EKG performed today that I personally reviewed demonstrates normal sinus rhythm.  Prior CV studies:  TTE 2017 with ejection fraction of 50 to 55% with no significant valvular abnormalities  Other studies Reviewed: Review of the additional studies/records demonstrates: CT abdomen pelvis 2021 without aortic atherosclerosis  Recent Labs: 06/26/2021: ALT 32; BUN 13; Creatinine, Ser 1.10; Hemoglobin 14.4; Platelets 248.0; Potassium 4.1; Sodium 140; TSH 0.81   Recent Lipid Panel Lab Results  Component Value Date/Time   CHOL 145 06/26/2021 03:23 PM   CHOL 206 (H) 10/19/2014 09:55 AM   TRIG 151.0 (H) 06/26/2021 03:23 PM   TRIG 116 10/19/2014 09:55 AM   HDL 51.00 06/26/2021 03:23 PM   HDL 55 10/19/2014 09:55 AM   LDLCALC 64 06/26/2021 03:23 PM   LDLCALC 65 11/08/2019 10:06 AM   LDLCALC 128 (H) 10/19/2014 09:55 AM   LDLDIRECT 89.0 04/20/2019 01:48 PM    Risk Assessment/Calculations:          Physical Exam:    VS:  BP 114/90 (BP Location: Left Arm, Patient Position: Sitting, Cuff Size: Normal)   Pulse 74   Ht 5' 7.5" (1.715 m)   Wt 211 lb (95.7 kg)   BMI 32.56 kg/m    Wt Readings from Last 3 Encounters:  11/03/21 211 lb (95.7 kg)  10/09/21 217 lb (98.4 kg)  10/09/21 216 lb (98 kg)    GENERAL:  No apparent distress,  AOx3 HEENT:  No carotid bruits, +2 carotid impulses, no scleral icterus CAR: RRR no murmurs, gallops, rubs, or thrills RES:  Clear to auscultation bilaterally ABD:  Soft, nontender, nondistended, positive bowel sounds x 4 VASC:  +2 radial pulses, +2 carotid pulses, palpable pedal pulses NEURO:  CN 2-12 grossly intact; motor and sensory grossly intact PSYCH:  No active depression or anxiety EXT:  No edema, ecchymosis, or cyanosis  Signed, Early Osmond, MD  11/03/2021 9:28 AM    [facility], [place], [place]  [address] Phone: [phone]; Fax: [phone]   Note:  This document was prepared using Dragon voice recognition software and may include unintentional dictation errors.

## 2021-11-03 ENCOUNTER — Ambulatory Visit (INDEPENDENT_AMBULATORY_CARE_PROVIDER_SITE_OTHER): Payer: Medicare Other | Admitting: Internal Medicine

## 2021-11-03 ENCOUNTER — Encounter: Payer: Self-pay | Admitting: Internal Medicine

## 2021-11-03 VITALS — BP 114/90 | HR 74 | Ht 67.5 in | Wt 211.0 lb

## 2021-11-03 DIAGNOSIS — E785 Hyperlipidemia, unspecified: Secondary | ICD-10-CM

## 2021-11-03 DIAGNOSIS — R000 Tachycardia, unspecified: Secondary | ICD-10-CM | POA: Diagnosis not present

## 2021-11-03 DIAGNOSIS — Z6833 Body mass index (BMI) 33.0-33.9, adult: Secondary | ICD-10-CM

## 2021-11-03 LAB — CBC
Hematocrit: 42.5 % (ref 37.5–51.0)
Hemoglobin: 14.3 g/dL (ref 13.0–17.7)
MCH: 27.9 pg (ref 26.6–33.0)
MCHC: 33.6 g/dL (ref 31.5–35.7)
MCV: 83 fL (ref 79–97)
Platelets: 233 10*3/uL (ref 150–450)
RBC: 5.13 x10E6/uL (ref 4.14–5.80)
RDW: 14.9 % (ref 11.6–15.4)
WBC: 4.6 10*3/uL (ref 3.4–10.8)

## 2021-11-03 LAB — COMPREHENSIVE METABOLIC PANEL
ALT: 20 IU/L (ref 0–44)
AST: 19 IU/L (ref 0–40)
Albumin/Globulin Ratio: 1.4 (ref 1.2–2.2)
Albumin: 4.2 g/dL (ref 3.8–4.9)
Alkaline Phosphatase: 83 IU/L (ref 44–121)
BUN/Creatinine Ratio: 10 (ref 9–20)
BUN: 10 mg/dL (ref 6–24)
Bilirubin Total: 0.6 mg/dL (ref 0.0–1.2)
CO2: 21 mmol/L (ref 20–29)
Calcium: 9.3 mg/dL (ref 8.7–10.2)
Chloride: 105 mmol/L (ref 96–106)
Creatinine, Ser: 0.98 mg/dL (ref 0.76–1.27)
Globulin, Total: 2.9 g/dL (ref 1.5–4.5)
Glucose: 104 mg/dL — ABNORMAL HIGH (ref 70–99)
Potassium: 4.5 mmol/L (ref 3.5–5.2)
Sodium: 138 mmol/L (ref 134–144)
Total Protein: 7.1 g/dL (ref 6.0–8.5)
eGFR: 92 mL/min/{1.73_m2} (ref >59)

## 2021-11-03 LAB — TSH RFX ON ABNORMAL TO FREE T4: TSH: 1.1 u[IU]/mL (ref 0.450–4.500)

## 2021-11-03 NOTE — Patient Instructions (Signed)
Medication Instructions:  Your physician recommends that you continue on your current medications as directed. Please refer to the Current Medication list given to you today.  *If you need a refill on your cardiac medications before your next appointment, please call your pharmacy*   Lab Work: TSH, CMP, CBC  If you have labs (blood work) drawn today and your tests are completely normal, you will receive your results only by: [place] (if you have MyChart) OR A paper copy in the mail If you have any lab test that is abnormal or we need to change your treatment, we will call you to review the results.   Testing/Procedures: ECHO Your physician has requested that you have an echocardiogram. Echocardiography is a painless test that uses sound waves to create images of your heart. It provides your doctor with information about the size and shape of your heart and how well your heart's chambers and valves are working. This procedure takes approximately one hour. There are no restrictions for this procedure.    Follow-Up: At [facility], you and your health needs are our priority.  As part of our continuing mission to provide you with exceptional heart care, we have created designated Provider Care Teams.  These Care Teams include your primary Cardiologist (physician) and Advanced Practice Providers (APPs -  Physician Assistants and Nurse Practitioners) who all work together to provide you with the care you need, when you need it.   Your next appointment:   As needed  The format for your next appointment:   In Person  Provider:   Early Osmond, MD   Important Information About Sugar

## 2021-11-11 ENCOUNTER — Encounter: Payer: Self-pay | Admitting: Internal Medicine

## 2021-11-12 ENCOUNTER — Other Ambulatory Visit: Payer: Self-pay | Admitting: Internal Medicine

## 2021-11-12 DIAGNOSIS — I471 Supraventricular tachycardia: Secondary | ICD-10-CM

## 2021-11-19 ENCOUNTER — Ambulatory Visit (HOSPITAL_COMMUNITY): Payer: Medicare Other | Attending: Internal Medicine

## 2021-11-19 DIAGNOSIS — G4730 Sleep apnea, unspecified: Secondary | ICD-10-CM | POA: Insufficient documentation

## 2021-11-19 DIAGNOSIS — I517 Cardiomegaly: Secondary | ICD-10-CM | POA: Insufficient documentation

## 2021-11-19 DIAGNOSIS — Z86711 Personal history of pulmonary embolism: Secondary | ICD-10-CM | POA: Diagnosis not present

## 2021-11-19 DIAGNOSIS — Z8616 Personal history of COVID-19: Secondary | ICD-10-CM | POA: Insufficient documentation

## 2021-11-19 DIAGNOSIS — N189 Chronic kidney disease, unspecified: Secondary | ICD-10-CM | POA: Diagnosis not present

## 2021-11-19 DIAGNOSIS — Z86718 Personal history of other venous thrombosis and embolism: Secondary | ICD-10-CM | POA: Diagnosis not present

## 2021-11-19 DIAGNOSIS — E785 Hyperlipidemia, unspecified: Secondary | ICD-10-CM | POA: Diagnosis not present

## 2021-11-19 DIAGNOSIS — I472 Ventricular tachycardia, unspecified: Secondary | ICD-10-CM | POA: Insufficient documentation

## 2021-11-19 DIAGNOSIS — R000 Tachycardia, unspecified: Secondary | ICD-10-CM | POA: Diagnosis not present

## 2021-11-19 LAB — ECHOCARDIOGRAM COMPLETE
Area-P 1/2: 4.02 cm2
S' Lateral: 2.9 cm
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
## 2021-11-25 ENCOUNTER — Ambulatory Visit (INDEPENDENT_AMBULATORY_CARE_PROVIDER_SITE_OTHER): Payer: Medicare Other | Admitting: Neurology

## 2021-11-25 ENCOUNTER — Encounter: Payer: Self-pay | Admitting: Neurology

## 2021-11-25 ENCOUNTER — Ambulatory Visit (INDEPENDENT_AMBULATORY_CARE_PROVIDER_SITE_OTHER): Payer: Medicare Other | Admitting: Podiatry

## 2021-11-25 VITALS — BP 110/76 | HR 88 | Ht 67.5 in | Wt 208.8 lb

## 2021-11-25 DIAGNOSIS — G43009 Migraine without aura, not intractable, without status migrainosus: Secondary | ICD-10-CM | POA: Diagnosis not present

## 2021-11-25 DIAGNOSIS — G44219 Episodic tension-type headache, not intractable: Secondary | ICD-10-CM

## 2021-11-25 DIAGNOSIS — M2012 Hallux valgus (acquired), left foot: Secondary | ICD-10-CM | POA: Diagnosis not present

## 2021-11-25 DIAGNOSIS — M7752 Other enthesopathy of left foot: Secondary | ICD-10-CM

## 2021-11-25 MED ORDER — NORTRIPTYLINE HCL 25 MG PO CAPS
25.0000 mg | ORAL_CAPSULE | Freq: Every day | ORAL | 5 refills | Status: DC
Start: 2021-11-25 — End: 2023-05-12

## 2021-11-26 ENCOUNTER — Telehealth: Payer: Self-pay | Admitting: Podiatry

## 2021-11-26 MED ORDER — METHYLPREDNISOLONE 4 MG PO TBPK: ORAL_TABLET | ORAL | 0 refills | Status: DC

## 2021-11-26 NOTE — Telephone Encounter (Signed)
Pt was seen in office on 7/25; stated that a RX was supposed to be called that is a paired with the shot he received yesterday. He uses the [facility] at [address] [place], [place]. Please advise.

## 2021-12-02 NOTE — Progress Notes (Signed)
Subjective:  Patient ID: Anthony [name], male    DOB: [birth year],  MRN: [id number]  Chief Complaint  Patient presents with   Foot Pain    56 y.o. male presents with the above complaint.  Patient presents with follow-up of left first MPJ pain.  Patient states started to act up again.  He states he is doing a lot better.  He denies any other acute complaints.  He would like to continue doing steroid injection for now.   Review of Systems: Negative except as noted in the HPI. Denies N/V/F/Ch.  Past Medical History:  Diagnosis Date   Allergy    Past hx    Anemia    Anxiety    Asthma    Cataract    removed left- forming on the right  DVT and PE from long travel trip    Chronic kidney disease 2006   kidney stones    Clotting disorder ([place]) 2016   PE - small    Complication of anesthesia    HARD TO WAKE UP AFTER 2ND EYE SURGERY   COVID-19 virus infection    Depression    DVT (deep venous thrombosis) (HCC)    DUE TO A LONG TRAVEL   Fatty liver    Genital herpes 05/06/2018   H/O renal calculi 2006   Headache    MIGRAINES   History of kidney stones    H/O   History of methicillin resistant staphylococcus aureus (MRSA)    Hyperlipidemia    Internal hemorrhoid    PE (pulmonary embolism)    Peptic ulcer disease    Pneumonia    h/o   PTSD (post-traumatic stress disorder)    Sleep apnea    DOES NOT USE CPAP CURRENTLY-MASK DOES NOT FIT CORRECTLY [facility]13-20)   Spondylosis    Tubular adenoma of colon 2011   Dr Olevia Perches    Current Outpatient Medications:    acetaminophen (TYLENOL) 500 MG tablet, Take 1,000 mg by mouth every 6 (six) hours as needed for moderate pain., Disp: , Rfl:    albuterol (PROVENTIL HFA;VENTOLIN HFA) 108 (90 Base) MCG/ACT inhaler, Inhale 2 puffs into the lungs every 6 (six) hours as needed. For shortness of breath, Disp: 1 Inhaler, Rfl: 1   aspirin EC 81 MG tablet, Take 81 mg by mouth daily., Disp: , Rfl:    atorvastatin  (LIPITOR) 10 MG tablet, TAKE 1 TABLET BY MOUTH EVERY DAY, Disp: 90 tablet, Rfl: 3   Brinzolamide-Brimonidine 1-0.2 % SUSP, Place 1 drop into the left eye 2 (two) times daily. , Disp: , Rfl:    busPIRone (BUSPAR) 10 MG tablet, TAKE ONE TABLET BY MOUTH TWICE A DAY FOR ANXIETY (Patient not taking: Reported on 11/25/2021), Disp: , Rfl:    carboxymethylcellulose (REFRESH PLUS) 0.5 % SOLN, Apply to eye., Disp: , Rfl:    cetirizine (ZYRTEC) 10 MG tablet, Take 1 tablet by mouth daily as needed., Disp: , Rfl:    Cholecalciferol (VITAMIN D3 PO), Take 1 tablet by mouth daily., Disp: , Rfl:    clotrimazole-betamethasone (LOTRISONE) cream, Use as directed twice per day as needed to the affected area, Disp: 45 g, Rfl: 1   dextromethorphan-guaiFENesin (ROBITUSSIN-DM) 10-100 MG/5ML liquid, TAKE 2 TEASPOONFULS BY MOUTH EVERY 6 HOURS AS NEEDED FOR COUGH, Disp: , Rfl:    fluticasone (FLONASE) 50 MCG/ACT nasal spray, Place into the nose., Disp: , Rfl:    fluticasone-salmeterol (ADVAIR) 250-50 MCG/ACT AEPB, INHALE 1 INHALATION  BY MOUTH TWICE A DAY (RINSE MOUTH WELL WITH WATER AFTER EACH USE) FOR BREATHING, Disp: , Rfl:    ketoconazole (NIZORAL) 2 % cream, Apply topically., Disp: , Rfl:    ketotifen (ZADITOR) 0.025 % ophthalmic solution, Apply to eye., Disp: , Rfl:    methylPREDNISolone (MEDROL DOSEPAK) 4 MG TBPK tablet, Take as directed, Disp: 21 each, Rfl: 0   Multiple Vitamin (MULTIVITAMIN) tablet, Take 1 tablet by mouth daily., Disp: , Rfl:    nortriptyline (PAMELOR) 25 MG capsule, Take 1 capsule (25 mg total) by mouth at bedtime., Disp: 30 capsule, Rfl: 5   OXcarbazepine (TRILEPTAL) 150 MG tablet, Take 150 mg by mouth as needed (FOR ANXIETY/PTSD)., Disp: , Rfl:    PARoxetine (PAXIL) 40 MG tablet, TAKE ONE-HALF TABLET BY MOUTH DAILY FOR MENTAL HEALTH, Disp: , Rfl:    phenazopyridine (PYRIDIUM) 200 MG tablet, Take 200 mg by mouth 3 (three) times daily as needed., Disp: , Rfl:    riboflavin (VITAMIN B-2) 100 MG TABS  tablet, TAKE FOUR TABLETS BY MOUTH DAILY FOR MIGRAINE PREVENTION, Disp: , Rfl:    sildenafil (VIAGRA) 100 MG tablet, TAKE ONE TABLET BY MOUTH AS INSTRUCTED AS NEEDED ERECTILE DYSFUNCTION (TAKE 1 HOUR PRIOR TO SEXUAL ACTIVITY *DO NOT EXCEED 1 DOSE PER 24 HOUR PERIOD*), Disp: , Rfl:    tiZANidine (ZANAFLEX) 4 MG tablet, TAKE ONE TABLET BY MOUTH AT BEDTIME FOR MUSCLE PAIN, SPASM; MAXIMUM 1 PILL A DAY; NO DRIVING FOR 8 HOURS, Disp: , Rfl:    triamcinolone cream (KENALOG) 0.1 %, Apply topically., Disp: , Rfl:    Ubrogepant 100 MG TABS, TAKE ONE TABLET BY MOUTH DAILY AS NEEDED - NEW MEDICINE FOR MIGRAINE RELIEF, Disp: , Rfl:    valACYclovir (VALTREX) 1000 MG tablet, Take 1/2 tablet by mouth twice daily for 3 days or 1 tablet by mouth every day for 5 days at onset of breakout., Disp: 90 tablet, Rfl: 1  Social History   Tobacco Use  Smoking Status Never  Smokeless Tobacco Never    Allergies  Allergen Reactions   Aleve [Naproxen] Rash    Patient stated it was years ago   Nsaids Other (See Comments)    Bleeding ulcers   Objective:  There were no vitals filed for this visit. There is no height or weight on file to calculate BMI. Constitutional Well developed. Well nourished.  Vascular Dorsalis pedis pulses palpable bilaterally. Posterior tibial pulses palpable bilaterally. Capillary refill normal to all digits.  No cyanosis or clubbing noted. Pedal hair growth normal.  Neurologic Normal speech. Oriented to person, place, and time. Epicritic sensation to light touch grossly present bilaterally.  Dermatologic Nails well groomed and normal in appearance. No open wounds. No skin lesions.  Orthopedic: Pain on palpation left first MPJ.  Mild pain with range of motion of the MPJ.  Pain on the medial eminence.  No deep intra-articular first MPJ pain noted.  No extensor or flexor tendinitis noted.  No pain at the sesamoidal complex.   Radiographs: 3 views of skeletally mature adult left foot.   There is increasing hypertrophy of the medial eminence.  No osteoarthritic changes noted at the first MPJ.  Sesamoid position is 5 out of 7. Assessment:   1. Hav (hallux abducto valgus), left   2. Capsulitis of metatarsophalangeal (MTP) joint of left foot     Plan:  Patient was evaluated and treated and all questions answered.  Left first MPJ capsulitis with underlying HAV deformity -I explained to the patient the  etiology of capsulitis numbers treatment options were discussed.  Given the amount of pain that he is experiencing I believe he will benefit from a steroid injection help decrease acute inflammatory component associate with pain.  Patient agrees with plan like to proceed with a steroid injection. -Another steroid injection was performed at left first MPJ using 1% plain Lidocaine and  10 mg of Kenalog. This was well tolerated. -I discussed shoe gear modification with the patient in extensive detail as well.   No follow-ups on file.

## 2021-12-13 IMAGING — CT CT ABD-PELV W/ CM
2 of 5 series · 16 of 46 positions shown, 18 images · IV contrast (omnipaque)
Comparison: Abdominopelvic CT 09/15/2012. Abdominal ultrasound
02/18/2018.

CLINICAL DATA: Right flank, right lower quadrant and left lower
quadrant abdominal pain with dark stools for 1 month. History of
ulcer. No history of malignancy or prior relevant surgery.

EXAM:
CT ABDOMEN AND PELVIS WITH CONTRAST
TECHNIQUE: Multidetector CT imaging of the abdomen and pelvis was performed
using the standard protocol following bolus administration of
intravenous contrast.
CONTRAST:  100mL OMNIPAQUE IOHEXOL 300 MG/ML  SOLN

[Series 2: abd/pel w · axial · 0.79mm/px · z∈[-468,-43]mm · 13 of 95 slices shown, 15 images]
[im 5/95  soft-tissue]
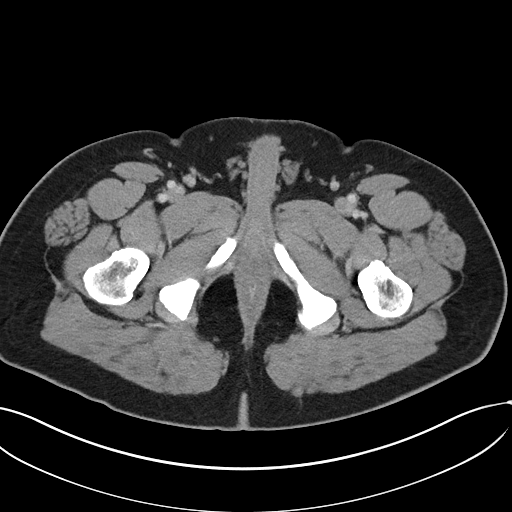
[im 5/95  bone]
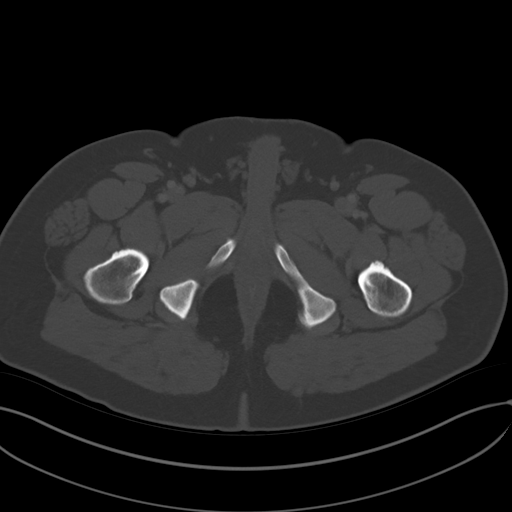
[im 15/95  soft-tissue]
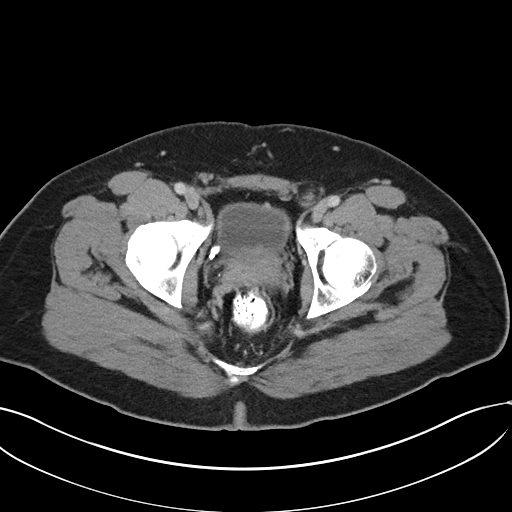
[im 19/95  soft-tissue]
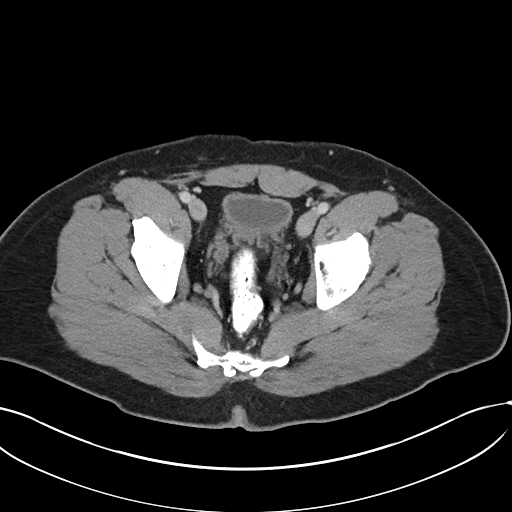
[im 29/95  soft-tissue]
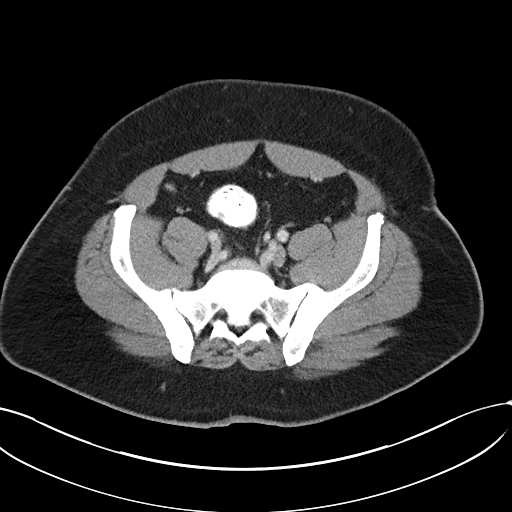
[im 33/95  soft-tissue]
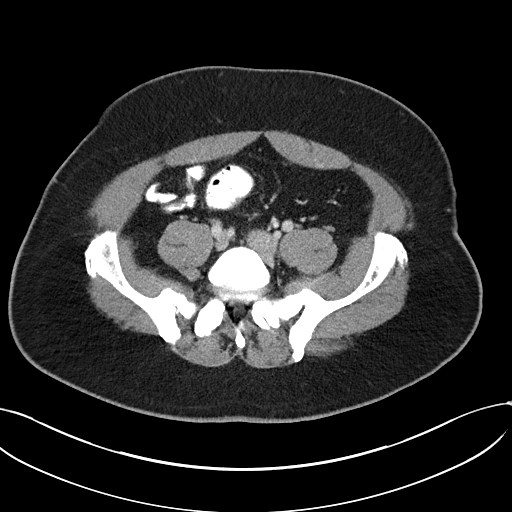
[im 43/95  soft-tissue]
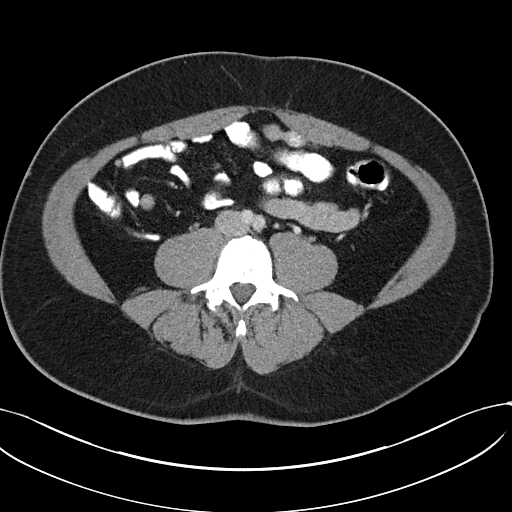
[im 48/95  soft-tissue]
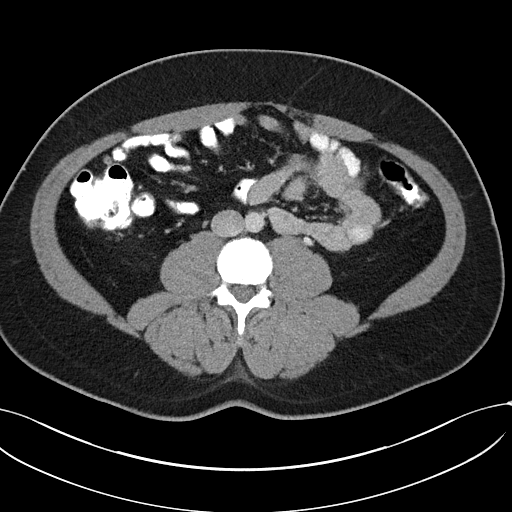
[im 52/95  soft-tissue]
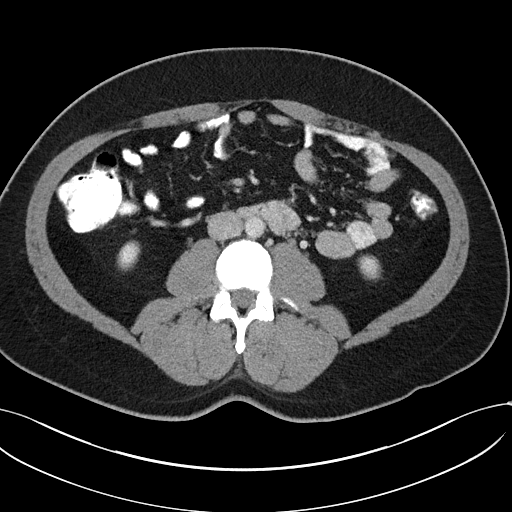
[im 62/95  soft-tissue]
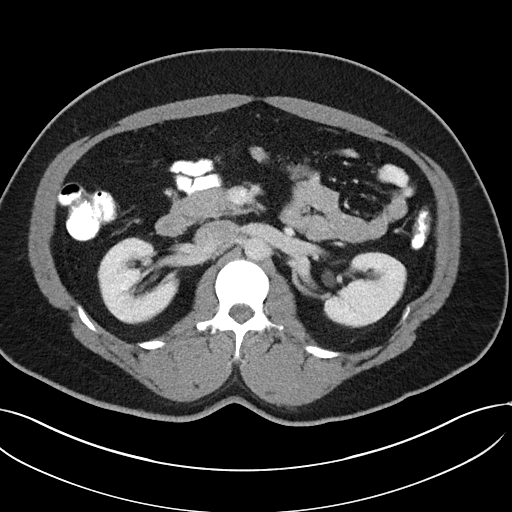
[im 62/95  bone]
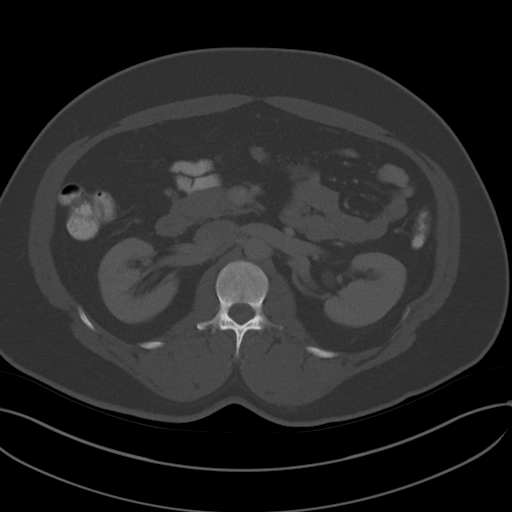
[im 66/95  soft-tissue]
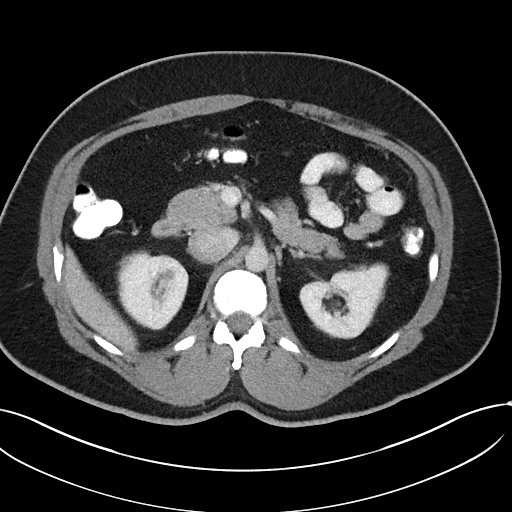
[im 76/95  soft-tissue]
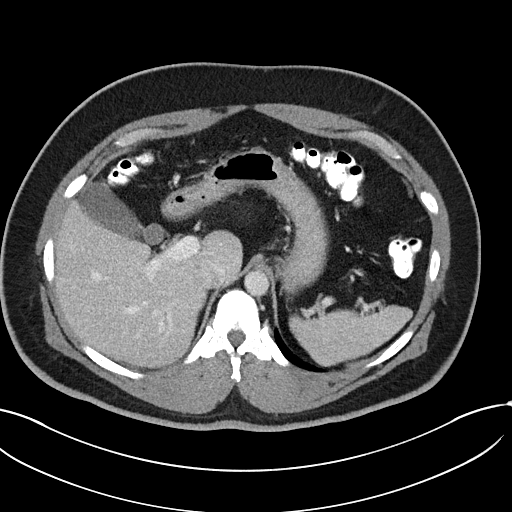
[im 80/95  soft-tissue]
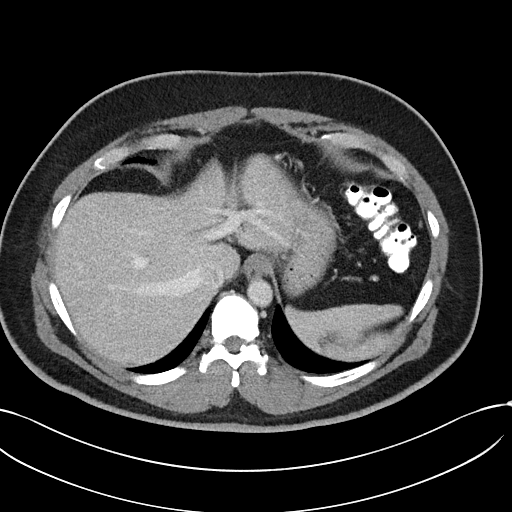
[im 90/95  soft-tissue]
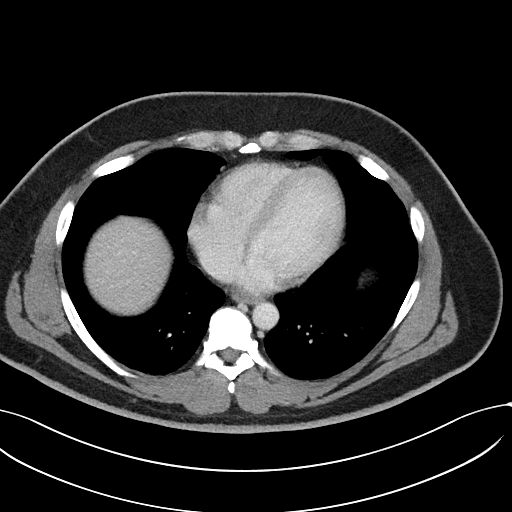

[Series 5: abd/pel w st · coronal · 0.85mm/px · 3 of 101 slices shown]
[im 34/101  soft-tissue]
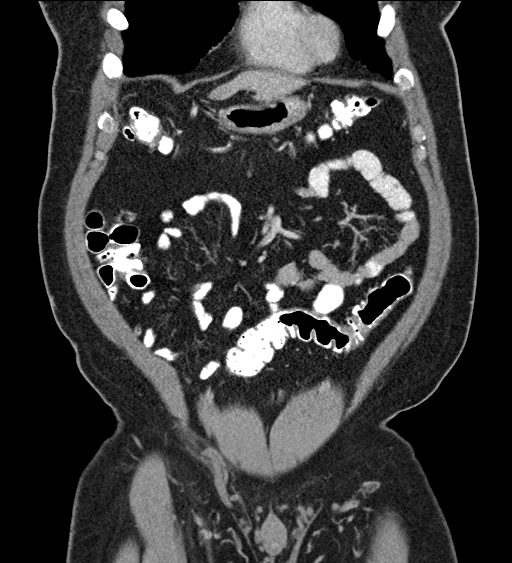
[im 45/101  soft-tissue]
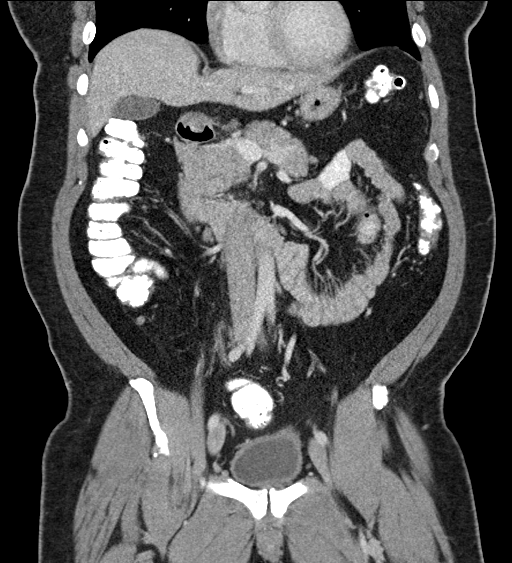
[im 56/101  soft-tissue]
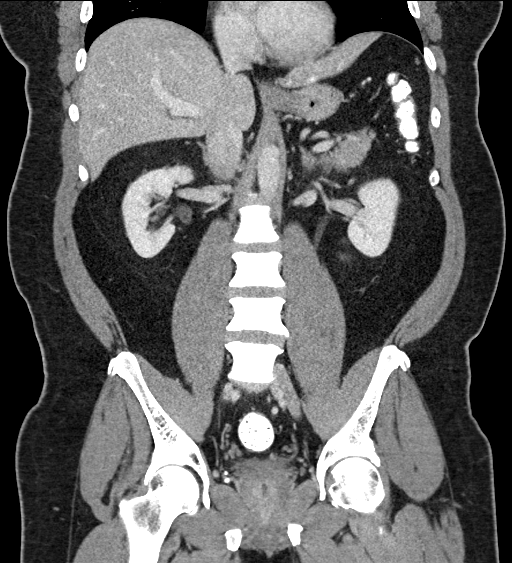

[16 of 46 positions shown; findings below may reference images not displayed]

FINDINGS: Lower chest: Clear lung bases. No significant pleural or pericardial
effusion.

Hepatobiliary: The liver is normal in density without suspicious
focal abnormality. No evidence of gallstones, gallbladder wall
thickening or biliary dilatation.

Pancreas: Unremarkable. No pancreatic ductal dilatation or
surrounding inflammatory changes.

Spleen: Normal in size without focal abnormality.

Adrenals/Urinary Tract: Both adrenal glands appear normal. No
evidence of urinary tract calculus or hydronephrosis. There are
several small right renal cysts, largest in the lower interpolar
region measuring 17 mm on image [DATE]. No evidence of enhancing renal
mass. The bladder appears normal.

Stomach/Bowel: No evidence of bowel wall thickening, distention or
surrounding inflammatory change. Mild distal colonic diverticulosis.
The appendix appears normal.

Vascular/Lymphatic: There are no enlarged abdominal or pelvic lymph
nodes. No significant vascular findings.

Reproductive: The prostate gland and seminal vesicles appear stable
without significant findings.

Other: There are multiple pelvic phleboliths bilaterally, similar to
previous study. No ascites. Small umbilical hernia containing only
fat.

Musculoskeletal: No acute or significant osseous findings.
IMPRESSION: 1. No acute findings or explanation for the patient's symptoms. No
evidence of urinary tract calculus or hydronephrosis.
2. Small right renal cysts.
3. Mild distal colonic diverticulosis.
4. Small umbilical hernia containing only fat.

## 2021-12-26 ENCOUNTER — Ambulatory Visit: Payer: Federal, State, Local not specified - PPO | Admitting: Podiatry

## 2021-12-30 ENCOUNTER — Ambulatory Visit: Payer: Federal, State, Local not specified - PPO | Admitting: Podiatry

## 2022-01-14 ENCOUNTER — Encounter: Payer: Self-pay | Admitting: Internal Medicine

## 2022-01-14 ENCOUNTER — Ambulatory Visit (INDEPENDENT_AMBULATORY_CARE_PROVIDER_SITE_OTHER): Payer: Medicare Other | Admitting: Internal Medicine

## 2022-01-14 VITALS — BP 122/82 | HR 86 | Temp 97.8°F | Ht 67.0 in | Wt 204.0 lb

## 2022-01-14 DIAGNOSIS — F419 Anxiety disorder, unspecified: Secondary | ICD-10-CM

## 2022-01-14 DIAGNOSIS — J45998 Other asthma: Secondary | ICD-10-CM | POA: Diagnosis not present

## 2022-01-14 DIAGNOSIS — M542 Cervicalgia: Secondary | ICD-10-CM

## 2022-01-14 MED ORDER — METHOCARBAMOL 500 MG PO TABS: 500.0000 mg | ORAL_TABLET | Freq: Four times a day (QID) | ORAL | 2 refills | Status: DC

## 2022-01-14 NOTE — Progress Notes (Signed)
Patient ID: Anthony [name], male   DOB: 1965-06-04, 56 y.o.   MRN: [id number]        Chief Complaint: follow up post neck pain right > left       HPI:  Anthony [name] is a 56 y.o. male here with initial c/o shoulder pain when appt made, but today pain is primarily bilateral post neck right > left, where tizanidine helped but made him too sleepy.  Pt denies chest pain, increased sob or doe, wheezing, orthopnea, PND, increased LE swelling, palpitations, dizziness or syncope.   Pt denies polydipsia, polyuria, or new focal neuro s/s.    Pt denies fever, wt loss, night sweats, loss of appetite, or other constitutional symptoms  Denies worsening depressive symptoms, suicidal ideation, or panic; has ongoing anxiety       Wt Readings from Last 3 Encounters:  01/14/22 204 lb (92.5 kg)  11/25/21 208 lb 12.8 oz (94.7 kg)  11/03/21 211 lb (95.7 kg)   BP Readings from Last 3 Encounters:  01/14/22 122/82  11/25/21 110/76  11/03/21 114/90         Past Medical History:  Diagnosis Date   Allergy    Past hx    Anemia    Anxiety    Asthma    Cataract    removed left- forming on the right  DVT and PE from long travel trip    Chronic kidney disease 2006   kidney stones    Clotting disorder ([place]) 2016   PE - small    Complication of anesthesia    HARD TO WAKE UP AFTER 2ND EYE SURGERY   COVID-19 virus infection    Depression    DVT (deep venous thrombosis) ([place])    DUE TO A LONG TRAVEL   Fatty liver    Genital herpes 05/06/2018   H/O renal calculi 2006   Headache    MIGRAINES   History of kidney stones    H/O   History of methicillin resistant staphylococcus aureus (MRSA)    Hyperlipidemia    Internal hemorrhoid    PE (pulmonary embolism)    Peptic ulcer disease    Pneumonia    h/o   PTSD (post-traumatic stress disorder)    Sleep apnea    DOES NOT USE CPAP CURRENTLY-MASK DOES NOT FIT CORRECTLY [facility]   Spondylosis    Tubular adenoma of colon 2011    Dr Olevia Perches   Past Surgical History:  Procedure Laterality Date   CATARACT EXTRACTION Left 1996   Dr Elonda Husky; OS   COLONOSCOPY     colonoscopy with polypectomy  2011   Dr Olevia Perches, hemorrhoids   CYSTOSCOPY W/ STONE MANIPULATION  2006   EYE SURGERY Left    INGUINAL HERNIA REPAIR Right 1990   POLYPECTOMY     SHOULDER SURGERY Right 12/17/2010   Dr French Ana ; shoulder impingement & torn tendon   TRANSURETHRAL RESECTION OF PROSTATE N/A 02/23/2020   Procedure: TRANSURETHRAL RESECTION OF THE PROSTATE (TURP);  Surgeon: Billey Co, MD;  Location: [facility] ORS;  Service: Urology;  Laterality: N/A;    reports that he has never smoked. He has never used smokeless tobacco. He reports current alcohol use. He reports that he does not use drugs. family history includes Cancer in his paternal grandmother; Dementia in his father and maternal aunt; Diabetes in his father; Esophageal cancer in his maternal uncle; Glaucoma in his father; Heart attack in his maternal uncle;  Heart attack (age of onset: 79) in his mother; Heart attack (age of onset: 54) in his maternal grandmother; Stroke (age of onset: 52) in his father. Allergies  Allergen Reactions   Aleve [Naproxen] Rash    Patient stated it was years ago   Nsaids Other (See Comments)    Bleeding ulcers   Current Outpatient Medications on File Prior to Visit  Medication Sig Dispense Refill   acetaminophen (TYLENOL) 500 MG tablet Take 1,000 mg by mouth every 6 (six) hours as needed for moderate pain.     albuterol (PROVENTIL HFA;VENTOLIN HFA) 108 (90 Base) MCG/ACT inhaler Inhale 2 puffs into the lungs every 6 (six) hours as needed. For shortness of breath 1 Inhaler 1   aspirin EC 81 MG tablet Take 81 mg by mouth daily.     atorvastatin (LIPITOR) 10 MG tablet TAKE 1 TABLET BY MOUTH EVERY DAY 90 tablet 3   Brinzolamide-Brimonidine 1-0.2 % SUSP Place 1 drop into the left eye 2 (two) times daily.      busPIRone (BUSPAR) 10 MG tablet       carboxymethylcellulose (REFRESH PLUS) 0.5 % SOLN Apply to eye.     cetirizine (ZYRTEC) 10 MG tablet Take 1 tablet by mouth daily as needed.     Cholecalciferol (VITAMIN D3 PO) Take 1 tablet by mouth daily.     clotrimazole-betamethasone (LOTRISONE) cream Use as directed twice per day as needed to the affected area 45 g 1   dextromethorphan-guaiFENesin (ROBITUSSIN-DM) 10-100 MG/5ML liquid TAKE 2 TEASPOONFULS BY MOUTH EVERY 6 HOURS AS NEEDED FOR COUGH     fluticasone (FLONASE) 50 MCG/ACT nasal spray Place into the nose.     fluticasone-salmeterol (ADVAIR) 250-50 MCG/ACT AEPB INHALE 1 INHALATION BY MOUTH TWICE A DAY (RINSE MOUTH WELL WITH WATER AFTER EACH USE) FOR BREATHING     ketoconazole (NIZORAL) 2 % cream Apply topically.     ketotifen (ZADITOR) 0.025 % ophthalmic solution Apply to eye.     methylPREDNISolone (MEDROL DOSEPAK) 4 MG TBPK tablet Take as directed 21 each 0   Multiple Vitamin (MULTIVITAMIN) tablet Take 1 tablet by mouth daily.     nortriptyline (PAMELOR) 25 MG capsule Take 1 capsule (25 mg total) by mouth at bedtime. 30 capsule 5   OXcarbazepine (TRILEPTAL) 150 MG tablet Take 150 mg by mouth as needed (FOR ANXIETY/PTSD).     PARoxetine (PAXIL) 40 MG tablet TAKE ONE-HALF TABLET BY MOUTH DAILY FOR MENTAL HEALTH     phenazopyridine (PYRIDIUM) 200 MG tablet Take 200 mg by mouth 3 (three) times daily as needed.     riboflavin (VITAMIN B-2) 100 MG TABS tablet TAKE FOUR TABLETS BY MOUTH DAILY FOR MIGRAINE PREVENTION     sildenafil (VIAGRA) 100 MG tablet TAKE ONE TABLET BY MOUTH AS INSTRUCTED AS NEEDED ERECTILE DYSFUNCTION (TAKE 1 HOUR PRIOR TO SEXUAL ACTIVITY *DO NOT EXCEED 1 DOSE PER 24 HOUR PERIOD*)     tiZANidine (ZANAFLEX) 4 MG tablet TAKE ONE TABLET BY MOUTH AT BEDTIME FOR MUSCLE PAIN, SPASM; MAXIMUM 1 PILL A DAY; NO DRIVING FOR 8 HOURS     triamcinolone cream (KENALOG) 0.1 % Apply topically.     Ubrogepant 100 MG TABS TAKE ONE TABLET BY MOUTH DAILY AS NEEDED - NEW MEDICINE FOR  MIGRAINE RELIEF     valACYclovir (VALTREX) 1000 MG tablet Take 1/2 tablet by mouth twice daily for 3 days or 1 tablet by mouth every day for 5 days at onset of breakout. 90 tablet 1   [DISCONTINUED] omeprazole (  PRILOSEC) 20 MG capsule Take 1 capsule (20 mg total) by mouth daily. 30 capsule 0   [DISCONTINUED] topiramate (TOPAMAX) 25 MG tablet topiramate 25 mg tablet (Patient not taking: No sig reported)     No current facility-administered medications on file prior to visit.        ROS:  All others reviewed and negative.  Objective        PE:  BP 122/82   Pulse 86   Temp 97.8 F (36.6 C)   Ht '5\' 7"'$  (1.702 m)   Wt 204 lb (92.5 kg)   SpO2 94%   BMI 31.95 kg/m                 Constitutional: Pt appears in NAD               HENT: Head: NCAT.                Right Ear: External ear normal.                 Left Ear: External ear normal.                Eyes: . Pupils are equal, round, and reactive to light. Conjunctivae and EOM are normal               Nose: without d/c or deformity               Neck: Neck supple. Gross decreased ROM mild right > left with mild tenderness and spasm               Cardiovascular: Normal rate and regular rhythm.                 Pulmonary/Chest: Effort normal and breath sounds without rales or wheezing.                              Neurological: Pt is alert. At baseline orientation, motor grossly intact               Skin: Skin is warm. No rashes, no other new lesions, LE edema - none               Psychiatric: Pt behavior is normal without agitation   Micro: none  Cardiac tracings I have personally interpreted today:  none  Pertinent Radiological findings (summarize): none   Lab Results  Component Value Date   WBC 4.6 11/03/2021   HGB 14.3 11/03/2021   HCT 42.5 11/03/2021   PLT 233 11/03/2021   GLUCOSE 104 (H) 11/03/2021   CHOL 145 06/26/2021   TRIG 151.0 (H) 06/26/2021   HDL 51.00 06/26/2021   LDLDIRECT 89.0 04/20/2019   LDLCALC 64  06/26/2021   ALT 20 11/03/2021   AST 19 11/03/2021   NA 138 11/03/2021   K 4.5 11/03/2021   CL 105 11/03/2021   CREATININE 0.98 11/03/2021   BUN 10 11/03/2021   CO2 21 11/03/2021   TSH 1.100 11/03/2021   PSA 0.46 06/26/2021   HGBA1C 6.1 06/26/2021   Assessment/Plan:  Anthony [name] is a 56 y.o. Black or African American [2] male with  has a past medical history of Allergy, Anemia, Anxiety, Asthma, Cataract, Chronic kidney disease (2006), Clotting disorder ([place]) (2951), Complication of anesthesia, COVID-19 virus infection, Depression, DVT (deep venous thrombosis) ([place]), Fatty liver, Genital herpes (05/06/2018), H/O renal calculi (2006), Headache, History of kidney stones, History  of methicillin resistant staphylococcus aureus (MRSA), Hyperlipidemia, Internal hemorrhoid, PE (pulmonary embolism), Peptic ulcer disease, Pneumonia, PTSD (post-traumatic stress disorder), Sleep apnea, Spondylosis, and Tubular adenoma of colon (2011).  Bilateral posterior neck pain By exam is c/w msk strain  - for change tizanidine to robaxin 500 mg prn, no neuro changes,  to f/u any worsening symptoms or concerns  Other asthma Stable overall, to continue albuterol hfa inhaler prn  Anxiety Stable overall, declines need for referral or psychiatry, for continued paxil 20 qd, buspar tid prn  Followup: Return in about 6 months (around 07/15/2022).  Cathlean Cower, MD 01/17/2022 7:32 PM [facility]

## 2022-01-14 NOTE — Patient Instructions (Addendum)
Ok to change the tizanidine to robaxin - this was sent to [facility]  Please continue all other medications as before, and refills have been done if requested.  Please have the pharmacy call with any other refills you may need.  Please keep your appointments with your specialists as you may have planned  Please make an Appointment to return in 6 months, or as needed

## 2022-01-17 ENCOUNTER — Encounter: Payer: Self-pay | Admitting: Internal Medicine

## 2022-01-17 DIAGNOSIS — M542 Cervicalgia: Secondary | ICD-10-CM | POA: Insufficient documentation

## 2022-01-17 NOTE — Assessment & Plan Note (Signed)
Stable overall, declines need for referral or psychiatry, for continued paxil 20 qd, buspar tid prn

## 2022-01-17 NOTE — Assessment & Plan Note (Signed)
By exam is c/w msk strain  - for change tizanidine to robaxin 500 mg prn, no neuro changes,  to f/u any worsening symptoms or concerns

## 2022-01-17 NOTE — Assessment & Plan Note (Signed)
Stable overall, to continue albuterol hfa inhaler prn °

## 2022-01-30 ENCOUNTER — Telehealth: Payer: Self-pay | Admitting: Internal Medicine

## 2022-01-30 NOTE — Telephone Encounter (Signed)
Left message for patient to call back and schedule Medicare Annual Wellness Visit (AWV).   Please offer to do virtually or by telephone.  Left office number and my jabber #[phone].  AWVI eligible as of 09/01/2021  Please schedule at anytime with Nurse Health Advisor.   

## 2022-02-10 DIAGNOSIS — M5412 Radiculopathy, cervical region: Secondary | ICD-10-CM | POA: Insufficient documentation

## 2022-02-18 ENCOUNTER — Encounter: Payer: Self-pay | Admitting: Internal Medicine

## 2022-02-21 NOTE — Progress Notes (Signed)
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 PFT: 10/08/2011-mild obstructive airways disease-small airways, insignificant response to bronchodilator Hypercoag panel 04/26/15- incr Protein C,/ managed by Carver Fila, FNP- Eliquis He brings military service records indicating wheezing and bronchospasm "probably secondary to pneumonia" in 1988 when he was in service in [place]. He was diagnosed with asthma and bronchopneumonia at that time. He says he had no asthma before that pneumonia and he denies family history of asthma. He requests a letter supporting his argument that asthma developed as a consequence of pneumonia which occurred while he was in [employer] service.  ---------------------------------------------------------------------------------------   05/16/20- 56 year old male never smoker, [employer],  followed for allergic Rhinitis, Asthma, complicated by history PE/DVT 2016, peptic ulcer, depression ([facility]), Glaucoma, OSA ([facility]), Covid infection jan, 2022,  Symbicort 160, albuterol HFA, Flonase Covid vax- 2 Moderna Flu vax- declines Had quarantined after Covid positive on Jan 3 , following visit to grandson + in [place]. Minor cough scant clear phlegm. Continues Symbicort daily, using rescue hfa 1x/ month I recommended Covid booster at least 4-6 weeks after infection. CXR 05/17/19- IMPRESSION: Normal chest radiographs.  02/23/22- 56 year old male never smoker, [employer],  followed for allergic Rhinitis, Asthma, complicated by history PE/DVT 2016, peptic ulcer, depression ([facility]), Glaucoma, OSA ([facility] manages), Covid infection jan, 2022,  -Advair 250, albuterol HFA, Flonase Covid vax- 2 Moderna Flu vax- declines OSA is managed by  [place]. -----Pt states he had a asthma attack in august or September, but is doing better since Using Advair because he says the [place] would not give him brand-name.  Had an asthma flare about a month ago but no idea what the trigger was and he manages okay with his rescue inhaler.  We discussed medication and his history of glaucoma. Had tachycardia and wore a monitor from cardiology- developed when he got sick visiting grandchildren in [place].  He insists it was not COVID this time.  ROS-see HPI + = positive Constitutional:   No-   weight loss, night sweats, fevers, chills, fatigue, lassitude. HEENT:   No-  headaches, difficulty swallowing, tooth/dental problems, sore throat,    no-sneezing, no-itching, ear ache, no- nasal congestion, post nasal drip,  CV:  +chest pain, No- orthopnea, PND, swelling in lower extremities, anasarca, dizziness, palpitations Resp: +shortness of breath with exertion or at rest.              No-  productive cough,  No non-productive cough,  No- coughing up of blood.                change in color of mucus.  No- wheezing.   Skin:  GI:  No-   heartburn, indigestion, abdominal pain, nausea, vomiting, GU:, MS:  No-   joint pain or swelling.   Neuro-     nothing unusual Psych:  No- change in mood or affect. No depression or anxiety.  No memory loss.  OBJ- Physical Exam General- Alert, Oriented, Affect-appropriate, Distress- none acute Skin- + mild hyperkeratosis on anterior chest Lymphadenopathy- none Head- atraumatic            Eyes- Gross vision intact, PERRLA, conjunctivae and secretions clear            Ears- Hearing, canals-normal            Nose-no-sniffing,  no-Septal dev, polyps, erosion, perforation             Throat- Mallampati III , mucosa clear , drainage- none, tonsils- atrophic Neck- flexible , trachea midline, no stridor , thyroid nl, carotid no bruit Chest - symmetrical excursion , unlabored           Heart/CV- RRR+ rapid , no murmur , no gallop  , no  rub, nl s1 s2                           - JVD- none , edema- none, stasis changes- none, varices- none           Lung- clear to P&A, wheeze-none, cough- none , dullness-none, rub- none           Chest wall-  Abd-  Br/ Gen/ Rectal- Not done, not indicated Extrem- cyanosis- none, clubbing, none, atrophy- none, strength- nl, negative Homan's Neuro- grossly intact to observation

## 2022-02-23 ENCOUNTER — Ambulatory Visit
Admission: EM | Admit: 2022-02-23 | Discharge: 2022-02-23 | Disposition: A | Discharge status: Home / Self Care | Payer: Medicare Other

## 2022-02-23 DIAGNOSIS — H524 Presbyopia: Secondary | ICD-10-CM | POA: Insufficient documentation

## 2022-02-23 DIAGNOSIS — R0789 Other chest pain: Secondary | ICD-10-CM | POA: Diagnosis not present

## 2022-02-23 NOTE — Discharge Instructions (Addendum)
Your EKG today was normal.  The source of your chest pain is related to your muscular skeletal system.  Possibly related to your recent workouts at the gym.  I would recommend that you continue going to the gym as tolerated, avoiding upper body workout, and use Tylenol or lidocaine patch for pain relief.     Please follow-up with your primary care or cardiology provider if your symptoms do not resolve.

## 2022-02-23 NOTE — ED Provider Notes (Signed)
[facility]    CSN: [id number] Arrival date & time: 02/23/22  1821      History   Chief Complaint No chief complaint on file.   HPI Anthony [name] is a 56 y.o. male.   HPI  Presents to UC with complaint of chest pain on the left side of his chest.  He states symptoms started on the right side of his chest a few days ago.  Patient has started going to the gym last week.  He says his last visit to the gym was Thursday.  He is unsure of when his chest pain started.  Denies dyspnea or SOB.  Past Medical History:  Diagnosis Date   Allergy    Past hx    Anemia    Anxiety    Asthma    Cataract    removed left- forming on the right  DVT and PE from long travel trip    Chronic kidney disease 2006   kidney stones    Clotting disorder ([place]) 2016   PE - small    Complication of anesthesia    HARD TO WAKE UP AFTER 2ND EYE SURGERY   COVID-19 virus infection    Depression    DVT (deep venous thrombosis) (HCC)    DUE TO A LONG TRAVEL   Fatty liver    Genital herpes 05/06/2018   H/O renal calculi 2006   Headache    MIGRAINES   History of kidney stones    H/O   History of methicillin resistant staphylococcus aureus (MRSA)    Hyperlipidemia    Internal hemorrhoid    PE (pulmonary embolism)    Peptic ulcer disease    Pneumonia    h/o   PTSD (post-traumatic stress disorder)    Sleep apnea    DOES NOT USE CPAP CURRENTLY-MASK DOES NOT FIT CORRECTLY [facility]13[facility]20[facility]   Spondylosis    Tubular adenoma of colon 2011   Dr Olevia Perches    Patient Active Problem List   Diagnosis Date Noted   Bilateral posterior neck pain 01/17/2022   Tachycardia 10/11/2021   Dental caries on smooth surface penetrating into dentin 10/09/2021   Tachycardia, unspecified 10/09/2021   Chronic chest wall pain 06/26/2021   Ocular hypertension, 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   Unspecified disorder of refraction 05/09/2021   Bilateral impacted cerumen 04/26/2021   Cellulitis  of left elbow 11/19/2020   Allergic rhinitis 10/15/2020   Brow ptosis, left 10/15/2020   Bunion of right foot 10/15/2020   Contact with and (suspected) exposure to covid-19 10/15/2020   COVID-19 10/15/2020   Deposits (accretions) on teeth 10/15/2020   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, left eye, stage unspecified 10/15/2020   Hematuria, unspecified 10/15/2020   Inflammatory disease of prostate, unspecified 10/15/2020   Male erectile disorder (CODE) 10/15/2020   Male erectile dysfunction, unspecified 10/15/2020   Nondependent alcohol abuse, continuous drinking behavior 10/15/2020   Obesity 10/15/2020   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   Unspecified ptosis of left eyelid 10/15/2020   Unspecified symptoms and signs involving cognitive functions and awareness 10/15/2020   Other recurrent depressive disorders ([place]) 10/15/2020   Post-traumatic stress disorder, unspecified 10/15/2020   Adjustment disorder, unspecified 10/15/2020   Encounter for dental examination and cleaning without abnormal findings 10/15/2020   Recurrent major depression ([place]) 10/15/2020   Major depressive disorder, recurrent, unspecified ([place]) 10/15/2020   Mood disorder ([place]) 10/15/2020   Dietary counseling and surveillance 10/15/2020   Other specified counseling  10/15/2020   Headache, unspecified 10/15/2020   Herpes simplex 10/15/2020   Migraine with aura, intractable, with status migrainosus 10/15/2020   Migraine without aura, not intractable, with status migrainosus 10/15/2020   Low back pain, unspecified 10/15/2020   Pain in left leg 10/15/2020   Other asthma 10/15/2020   Meralgia paresthetica of left side 07/03/2020   Major depressive disorder, single episode, unspecified 06/25/2020   Leg cramping 09/04/2019   Pain in joint of right elbow 08/03/2019   Lateral epicondylitis of right elbow 08/03/2019   Nephrolithiasis 05/11/2019   Epigastric pain 05/11/2019   Other constipation 05/11/2019   Right sided abdominal  pain 05/11/2019   Paresthesia 04/20/2019   Chronic post-traumatic stress disorder 01/14/2019   Left ear hearing loss 01/14/2019   Low back pain 01/14/2019   Intractable migraine with aura without status migrainosus 01/07/2019   Genital herpes 05/06/2018   STD exposure 05/06/2018   Obstructive sleep apnea 02/13/2018   Hyperglycemia 01/24/2018   Pelvic pain 07/10/2017   Exposure to blood or body fluid 07/07/2017   Trigger thumb of right hand 05/21/2017   Trigger finger, right middle finger 03/18/2017   Anxiety 03/18/2017   Chronic tension-type headache, not intractable 02/12/2017   Herpes simplex type II infection 12/02/2016   Dysuria 12/02/2016   Pain of right thumb 11/13/2016   Other chest pain 07/27/2016   Onychomycosis 06/12/2016   Encounter for well adult exam with abnormal findings 06/12/2016   Generalized headache 05/15/2016   Lumbar paraspinal muscle spasm 05/15/2016   History of DVT (deep vein thrombosis) 11/18/2015   Elevated PSA 11/18/2015   Acute pulmonary embolism ([place]) 2016 05/09/2015   Peptic ulcer 05/09/2015   Dyspnea 05/02/2015   DVT (deep venous thrombosis) ([place]) 04/26/2015   Duodenal ulcer 04/25/2015   Positive D dimer 04/23/2015   Internal hemorrhoid, bleeding 04/04/2015   Acute gastrointestinal hemorrhage 04/03/2015   Duodenitis 04/03/2015   Postural dizziness with presyncope 04/03/2015   Microscopic hematuria 04/03/2015   Dislocated IOL (intraocular lens), initial encounter 12/10/2014   MRSA infection 09/14/2013   History of methicillin resistant Staphylococcus aureus infection 02/17/2013   Spondylosis of lumbar joint 09/28/2012   Fatty liver disease, nonalcoholic 45/40/9811   Lumbosacral spondylosis without myelopathy 09/28/2012   Fatty metamorphosis of liver 09/28/2012   Atopic dermatitis, unspecified 05/13/2012   Seasonal and perennial allergic rhinitis 10/01/2011   Family history of ischemic heart disease 04/02/2011   Low serum testosterone level  03/12/2011   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/12/2011   COLONIC POLYPS, HX OF 06/21/2009   Hyperlipidemia 02/16/2008   Asthma, mild persistent 02/16/2008   NONSPECIFIC ABNORMAL ELECTROCARDIOGRAM 02/16/2008   NEPHROLITHIASIS, HX OF 02/16/2008    Past Surgical History:  Procedure Laterality Date   CATARACT EXTRACTION Left 1996   Dr Elonda Husky; OS   COLONOSCOPY     colonoscopy with polypectomy  2011   Dr Olevia Perches, hemorrhoids   CYSTOSCOPY W/ STONE MANIPULATION  2006   EYE SURGERY Left    INGUINAL HERNIA REPAIR Right 1990   POLYPECTOMY     SHOULDER SURGERY Right 12/17/2010   Dr French Ana ; shoulder impingement & torn tendon   TRANSURETHRAL RESECTION OF PROSTATE N/A 02/23/2020   Procedure: TRANSURETHRAL RESECTION OF THE PROSTATE (TURP);  Surgeon: Billey Co, MD;  Location: [facility];  Service: Urology;  Laterality: N/A;       Home Medications    Prior to Admission medications   Medication Sig Start Date End Date Taking? Authorizing Provider  acetaminophen (TYLENOL) 500 MG tablet  Take 1,000 mg by mouth every 6 (six) hours as needed for moderate pain.    [provider]  albuterol (PROVENTIL HFA;VENTOLIN HFA) 108 (90 Base) MCG/ACT inhaler Inhale 2 puffs into the lungs every 6 (six) hours as needed. For shortness of breath 06/29/16   [place], Betty G, MD  aspirin EC 81 MG tablet Take 81 mg by mouth daily.    [provider]  atorvastatin (LIPITOR) 10 MG tablet TAKE 1 TABLET BY MOUTH EVERY DAY 09/15/21   Biagio Borg, MD  Brinzolamide-Brimonidine 1-0.2 % SUSP Place 1 drop into the left eye 2 (two) times daily.     [provider]  busPIRone (BUSPAR) 10 MG tablet  08/08/21   [provider]  carboxymethylcellulose (REFRESH PLUS) 0.5 % SOLN Apply to eye. 11/15/20   [provider]  cetirizine (ZYRTEC) 10 MG tablet Take 1 tablet by mouth daily as needed. 08/18/21   [provider]  Cholecalciferol (VITAMIN D3 PO) Take 1 tablet by mouth daily.    [provider]  clotrimazole-betamethasone (LOTRISONE) cream Use as directed twice per day as needed to the affected area 12/27/20   Biagio Borg, MD  dextromethorphan-guaiFENesin (ROBITUSSIN-DM) 10-100 MG/5ML liquid TAKE 2 TEASPOONFULS BY MOUTH EVERY 6 HOURS AS NEEDED FOR COUGH 10/06/21   [provider]  fluticasone (FLONASE) 50 MCG/ACT nasal spray Place into the nose. 07/12/20   [provider]  fluticasone-salmeterol (ADVAIR) 250-50 MCG/ACT AEPB INHALE 1 INHALATION BY MOUTH TWICE A DAY (RINSE MOUTH WELL WITH WATER AFTER EACH USE) FOR BREATHING 08/11/21   [provider]  ketoconazole (NIZORAL) 2 % cream Apply topically. 07/12/20   [provider]  ketotifen (ZADITOR) 0.025 % ophthalmic solution Apply to eye. 11/15/20   [provider]  methocarbamol (ROBAXIN) 500 MG tablet Take 1 tablet (500 mg total) by mouth 4 (four) times daily. 01/14/22   Biagio Borg, MD  methylPREDNISolone (MEDROL DOSEPAK) 4 MG TBPK tablet Take as directed 11/26/21   Felipa Furnace, DPM  Multiple Vitamin (MULTIVITAMIN) tablet Take 1 tablet by mouth daily.    [provider]  nortriptyline (PAMELOR) 25 MG capsule Take 1 capsule (25 mg total) by mouth at bedtime. 11/25/21   Tomi Likens, Adam R, DO  OXcarbazepine (TRILEPTAL) 150 MG tablet Take 150 mg by mouth as needed (FOR ANXIETY/PTSD).    [provider]  PARoxetine (PAXIL) 40 MG tablet TAKE ONE-HALF TABLET BY MOUTH DAILY FOR MENTAL HEALTH 01/01/20   [provider]  phenazopyridine (PYRIDIUM) 200 MG tablet Take 200 mg by mouth 3 (three) times daily as needed. 10/03/21   [provider]  riboflavin (VITAMIN B-2) 100 MG TABS tablet TAKE FOUR TABLETS BY MOUTH DAILY FOR MIGRAINE PREVENTION 08/18/21   [provider]  sildenafil (VIAGRA) 100 MG tablet TAKE ONE TABLET BY MOUTH AS INSTRUCTED AS NEEDED ERECTILE DYSFUNCTION (TAKE 1 HOUR PRIOR TO SEXUAL ACTIVITY *DO NOT EXCEED 1 DOSE PER 24 HOUR PERIOD*) 08/29/21    [provider]  tiZANidine (ZANAFLEX) 4 MG tablet TAKE ONE TABLET BY MOUTH AT BEDTIME FOR MUSCLE PAIN, SPASM; MAXIMUM 1 PILL A DAY; NO DRIVING FOR 8 HOURS 01/06/17   [provider]  triamcinolone cream (KENALOG) 0.1 % Apply topically. 09/06/20   [provider]  Ubrogepant 100 MG TABS TAKE ONE TABLET BY MOUTH DAILY AS NEEDED - NEW MEDICINE FOR MIGRAINE RELIEF 02/17/21   [provider]  valACYclovir (VALTREX) 1000 MG tablet Take 1/2 tablet by mouth twice  daily for 3 days or 1 tablet by mouth every day for 5 days at onset of breakout. 04/24/21   Biagio Borg, MD  omeprazole (PRILOSEC) 20 MG capsule Take 1 capsule (20 mg total) by mouth daily. 04/03/20 04/13/20  Faustino Congress, NP  topiramate (TOPAMAX) 25 MG tablet topiramate 25 mg tablet Patient not taking: No sig reported  04/13/20  [provider]    Family History Family History  Problem Relation Age of Onset   Heart attack Mother 15   Dementia Father    Stroke Father 24   Diabetes Father        PVD   Glaucoma Father        blindness   Dementia Maternal Aunt    Heart attack Maternal Uncle         X2,pre 55   Esophageal cancer Maternal Uncle    Heart attack Maternal Grandmother 73   Cancer Paternal Grandmother        ? primary   Asthma Neg Hx    COPD Neg Hx    Colon cancer Neg Hx    Colon polyps Neg Hx    Stomach cancer Neg Hx    Rectal cancer Neg Hx     Social History Social History   Tobacco Use   Smoking status: Never   Smokeless tobacco: Never  Vaping Use   Vaping Use: Never used  Substance Use Topics   Alcohol use: Yes    Alcohol/week: 0.0 standard drinks of alcohol    Comment: Socially , < 2X/ month   Drug use: No     Allergies   Aleve [naproxen] and Nsaids   Review of Systems Review of Systems   Physical Exam Triage Vital Signs ED Triage Vitals  Enc Vitals Group     BP      Pulse      Resp      Temp      Temp src      SpO2      Weight       Height      Head Circumference      Peak Flow      Pain Score      Pain Loc      Pain Edu?      Excl. in [place]?    No data found.  Updated Vital Signs There were no vitals taken for this visit.  Visual Acuity Right Eye Distance:   Left Eye Distance:   Bilateral Distance:    Right Eye Near:   Left Eye Near:    Bilateral Near:     Physical Exam Vitals reviewed.  Constitutional:      Appearance: Normal appearance.  Cardiovascular:     Rate and Rhythm: Normal rate and regular rhythm.     Pulses: Normal pulses.     Heart sounds: Normal heart sounds.  Pulmonary:     Effort: Pulmonary effort is normal.     Breath sounds: Normal breath sounds.  Chest:     Chest wall: Tenderness present.  Musculoskeletal:       Arms:  Skin:    General: Skin is warm and dry.  Neurological:     General: No focal deficit present.     Mental Status: He is alert and oriented to person, place, and time.  Psychiatric:        Mood and Affect: Mood normal.        Behavior: Behavior normal.  UC Treatments / Results  Labs (all labs ordered are listed, but only abnormal results are displayed) Labs Reviewed - No data to display  EKG   Radiology No results found.  Procedures Procedures (including critical care time)  Medications Ordered in UC Medications - No data to display  Initial Impression / Assessment and Plan / UC Course  I have reviewed the triage vital signs and the nursing notes.  Pertinent labs & imaging results that were available during my care of the patient were reviewed by me and considered in my medical decision making (see chart for details).   EKG is reassuring. NSR. Suspect MSK pain and will recommend that patient use analgesics as tolerated avoid upper body workout until symptoms resolve.  Chart indicates intolerance to NSAIDs.  Follow-up with your primary care cardiology provider.   Final Clinical Impressions(s) / UC Diagnoses   Final diagnoses:  None    Discharge Instructions   None    ED Prescriptions   None    PDMP not reviewed this encounter.   Rose Phi, [facility] 02/23/22 2003

## 2022-02-23 NOTE — ED Triage Notes (Signed)
Pt. Presents to UC w/ c/o left chest pain. Pt. States his chest pain started on the right side and has now spread to the left side. Pt. Endorses an aching feeling. Pt.also states he has in th past had to see a cardiologist.

## 2022-02-24 ENCOUNTER — Encounter: Payer: Self-pay | Admitting: Internal Medicine

## 2022-02-24 ENCOUNTER — Ambulatory Visit (INDEPENDENT_AMBULATORY_CARE_PROVIDER_SITE_OTHER): Payer: Medicare Other | Admitting: Internal Medicine

## 2022-02-24 DIAGNOSIS — J4530 Mild persistent asthma, uncomplicated: Secondary | ICD-10-CM | POA: Diagnosis not present

## 2022-02-24 MED ORDER — BUDESONIDE-FORMOTEROL FUMARATE 160-4.5 MCG/ACT IN AERO: INHALATION_SPRAY | RESPIRATORY_TRACT | 12 refills | Status: DC

## 2022-02-24 NOTE — Patient Instructions (Signed)
Script sent for Symbicort inhaler. This would be your maintenance inhaler. Generic would be fine.  If this costs too much you should talk again with your [place] doctors, but whichever they give you should work fine.

## 2022-02-25 ENCOUNTER — Encounter: Payer: Self-pay | Admitting: Internal Medicine

## 2022-02-25 NOTE — Assessment & Plan Note (Signed)
His understanding is limited. Briefly discussed triggers. Plan- add maintenance inhaler for trial - Symbicort with discussion

## 2022-04-06 ENCOUNTER — Encounter: Payer: Self-pay | Admitting: Urology

## 2022-05-13 ENCOUNTER — Encounter: Payer: Self-pay | Admitting: Internal Medicine

## 2022-05-22 ENCOUNTER — Encounter: Payer: Self-pay | Admitting: Urology
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
## 2022-06-04 ENCOUNTER — Encounter: Payer: Self-pay | Admitting: Neurology

## 2022-06-04 ENCOUNTER — Telehealth (INDEPENDENT_AMBULATORY_CARE_PROVIDER_SITE_OTHER): Payer: Medicare Other | Admitting: Neurology

## 2022-06-04 VITALS — Ht 66.5 in | Wt 210.0 lb

## 2022-06-04 DIAGNOSIS — G43009 Migraine without aura, not intractable, without status migrainosus: Secondary | ICD-10-CM

## 2022-06-04 DIAGNOSIS — G44219 Episodic tension-type headache, not intractable: Secondary | ICD-10-CM | POA: Diagnosis not present

## 2022-08-10 ENCOUNTER — Encounter: Payer: Medicare Other | Admitting: Internal Medicine

## 2022-08-17 ENCOUNTER — Encounter: Payer: Self-pay | Admitting: Internal Medicine

## 2022-08-18 ENCOUNTER — Encounter: Payer: Self-pay | Admitting: Internal Medicine

## 2022-08-18 ENCOUNTER — Ambulatory Visit (INDEPENDENT_AMBULATORY_CARE_PROVIDER_SITE_OTHER): Payer: Medicare Other | Admitting: Internal Medicine

## 2022-08-18 VITALS — BP 126/80 | HR 78 | Temp 97.8°F | Ht 66.5 in | Wt 210.0 lb

## 2022-08-18 DIAGNOSIS — E559 Vitamin D deficiency, unspecified: Secondary | ICD-10-CM

## 2022-08-18 DIAGNOSIS — H40059 Ocular hypertension, unspecified eye: Secondary | ICD-10-CM | POA: Insufficient documentation

## 2022-08-18 DIAGNOSIS — E538 Deficiency of other specified B group vitamins: Secondary | ICD-10-CM | POA: Diagnosis not present

## 2022-08-18 DIAGNOSIS — Z0001 Encounter for general adult medical examination with abnormal findings: Secondary | ICD-10-CM

## 2022-08-18 DIAGNOSIS — E785 Hyperlipidemia, unspecified: Secondary | ICD-10-CM

## 2022-08-18 DIAGNOSIS — R739 Hyperglycemia, unspecified: Secondary | ICD-10-CM

## 2022-08-18 DIAGNOSIS — H259 Unspecified age-related cataract: Secondary | ICD-10-CM | POA: Insufficient documentation

## 2022-08-18 DIAGNOSIS — N529 Male erectile dysfunction, unspecified: Secondary | ICD-10-CM | POA: Diagnosis not present

## 2022-08-18 DIAGNOSIS — L919 Hypertrophic disorder of the skin, unspecified: Secondary | ICD-10-CM | POA: Insufficient documentation

## 2022-08-18 DIAGNOSIS — R972 Elevated prostate specific antigen [PSA]: Secondary | ICD-10-CM

## 2022-08-18 DIAGNOSIS — M2140 Flat foot [pes planus] (acquired), unspecified foot: Secondary | ICD-10-CM | POA: Insufficient documentation

## 2022-08-18 DIAGNOSIS — R7989 Other specified abnormal findings of blood chemistry: Secondary | ICD-10-CM

## 2022-08-18 DIAGNOSIS — Z961 Presence of intraocular lens: Secondary | ICD-10-CM | POA: Insufficient documentation

## 2022-08-18 DIAGNOSIS — F331 Major depressive disorder, recurrent, moderate: Secondary | ICD-10-CM | POA: Insufficient documentation

## 2022-08-18 NOTE — Patient Instructions (Signed)
Please continue all other medications as before, and refills have been done if requested.  Please have the pharmacy call with any other refills you may need.  Please continue your efforts at being more active, low cholesterol diet, and weight control.  You are otherwise up to date with prevention measures today.  Please keep your appointments with your specialists as you may have planned  Please go to the LAB at the blood drawing area for the tests to be done - at the [facility] lab tomorrow  Please make an Appointment to return for your 1 week

## 2022-08-18 NOTE — Progress Notes (Unsigned)
Patient ID: Anthony [name], male   DOB: Sep 26, 1965, 57 y.o.   MRN: [id number]         Chief Complaint:: wellness exam and hld, hyperglycemia, ED       HPI:  Anthony [name] is a 57 y.o. male here for wellness exam; for shiingrix and tdap at pharmacy, declines covid booster, o/w up to date                        Also Pt denies chest pain, increased sob or doe, wheezing, orthopnea, PND, increased LE swelling, palpitations, dizziness or syncope.   Pt denies polydipsia, polyuria, or new focal neuro s/s.    Pt denies fever, wt loss, night sweats, loss of appetite, or other constitutional symptoms     Wt Readings from Last 3 Encounters:  08/18/22 210 lb (95.3 kg)  06/04/22 210 lb (95.3 kg)  02/24/22 208 lb 9.6 oz (94.6 kg)   BP Readings from Last 3 Encounters:  08/18/22 126/80  02/24/22 114/66  02/23/22 124/86   Immunization History  Administered Date(s) Administered   Moderna Sars-Covid-2 Vaccination 07/27/2019   PFIZER(Purple Top)SARS-COV-2 Vaccination 08/18/2019, 09/17/2019   Tdap 01/19/2011   Health Maintenance Due  Topic Date Due   Medicare Annual Wellness (AWV)  Never done   Zoster Vaccines- Shingrix (1 of 2) Never done   DTaP/Tdap/Td (2 - Td or Tdap) 01/18/2021      Past Medical History:  Diagnosis Date   Allergy    Past hx    Anemia    Anxiety    Asthma    Cataract    removed left- forming on the right  DVT and PE from long travel trip    Chronic kidney disease 2006   kidney stones    Clotting disorder 2016   PE - small    Complication of anesthesia    HARD TO WAKE UP AFTER 2ND EYE SURGERY   COVID-19 virus infection    Depression    DVT (deep venous thrombosis)    DUE TO A LONG TRAVEL   Fatty liver    Genital herpes 05/06/2018   H/O renal calculi 2006   Headache    MIGRAINES   History of kidney stones    H/O   History of methicillin resistant staphylococcus aureus (MRSA)    Hyperlipidemia    Internal hemorrhoid    PE (pulmonary embolism)    Peptic ulcer  disease    Pneumonia    h/o   PTSD (post-traumatic stress disorder)    Sleep apnea    DOES NOT USE CPAP CURRENTLY-MASK DOES NOT FIT CORRECTLY [facility] ([facility]13-20)   Spondylosis    Tubular adenoma of colon 2011   Dr Juanda Chance   Past Surgical History:  Procedure Laterality Date   CATARACT EXTRACTION Left 1996   Dr Chales Abrahams; OS   COLONOSCOPY     colonoscopy with polypectomy  2011   Dr Juanda Chance, hemorrhoids   CYSTOSCOPY W/ STONE MANIPULATION  2006   EYE SURGERY Left    INGUINAL HERNIA REPAIR Right 1990   POLYPECTOMY     SHOULDER SURGERY Right 12/17/2010   Dr Madelon Lips ; shoulder impingement & torn tendon   TRANSURETHRAL RESECTION OF PROSTATE N/A 02/23/2020   Procedure: TRANSURETHRAL RESECTION OF THE PROSTATE (TURP);  Surgeon: Sondra Come, MD;  Location: [facility] ORS;  Service: Urology;  Laterality: N/A;    reports that he has never smoked.  He has never used smokeless tobacco. He reports current alcohol use. He reports that he does not use drugs. family history includes Cancer in his paternal grandmother; Dementia in his father and maternal aunt; Diabetes in his father; Esophageal cancer in his maternal uncle; Glaucoma in his father; Heart attack in his maternal uncle; Heart attack (age of onset: 33) in his mother; Heart attack (age of onset: 80) in his maternal grandmother; Stroke (age of onset: 28) in his father. Allergies  Allergen Reactions   Aleve [Naproxen] Rash    Patient stated it was years ago   Nsaids Other (See Comments)    Bleeding ulcers   Current Outpatient Medications on File Prior to Visit  Medication Sig Dispense Refill   acetaminophen (TYLENOL) 500 MG tablet Take 1,000 mg by mouth every 6 (six) hours as needed for moderate pain.     albuterol (PROVENTIL HFA;VENTOLIN HFA) 108 (90 Base) MCG/ACT inhaler Inhale 2 puffs into the lungs every 6 (six) hours as needed. For shortness of breath 1 Inhaler 1   aspirin EC 81 MG tablet Take 81 mg by mouth daily.      Brinzolamide-Brimonidine 1-0.2 % SUSP Place 1 drop into the left eye 2 (two) times daily.      budesonide-formoterol (SYMBICORT) 160-4.5 MCG/ACT inhaler Inhale 2 puffs then rinse mouth, twice every day 1 each 12   busPIRone (BUSPAR) 10 MG tablet      busPIRone (BUSPAR) 15 MG tablet TAKE ONE TABLET BY MOUTH TWICE A DAY FOR ANXIETY FOR ANXIETY     carboxymethylcellulose (REFRESH PLUS) 0.5 % SOLN Apply to eye.     cetirizine (ZYRTEC) 10 MG tablet Take 1 tablet by mouth daily as needed.     Cholecalciferol (VITAMIN D3 PO) Take 1 tablet by mouth daily.     diclofenac Sodium (VOLTAREN) 1 % GEL Apply topically.     fluticasone (FLONASE) 50 MCG/ACT nasal spray Place into the nose.     Multiple Vitamin (MULTIVITAMIN) tablet Take 1 tablet by mouth daily.     nortriptyline (PAMELOR) 25 MG capsule Take 1 capsule (25 mg total) by mouth at bedtime. 30 capsule 5   ondansetron (ZOFRAN-ODT) 8 MG disintegrating tablet Take 1 tablet by mouth every 12 (twelve) hours.     OXcarbazepine (TRILEPTAL) 150 MG tablet Take 150 mg by mouth as needed (FOR ANXIETY/PTSD).     PARoxetine (PAXIL) 40 MG tablet TAKE ONE-HALF TABLET BY MOUTH DAILY FOR MENTAL HEALTH     riboflavin (VITAMIN B-2) 100 MG TABS tablet TAKE FOUR TABLETS BY MOUTH DAILY FOR MIGRAINE PREVENTION     sertraline (ZOLOFT) 100 MG tablet TAKE ONE-HALF TABLET BY MOUTH IN THE MORNING FOR PTSD/MOOD FOR MENTAL HEALTH START IN 8 DAYS, AFTER DISCONTINUING PAROXETINE     sildenafil (VIAGRA) 100 MG tablet TAKE ONE TABLET BY MOUTH AS INSTRUCTED AS NEEDED ERECTILE DYSFUNCTION (TAKE 1 HOUR PRIOR TO SEXUAL ACTIVITY *DO NOT EXCEED 1 DOSE PER 24 HOUR PERIOD*)     tiZANidine (ZANAFLEX) 4 MG tablet TAKE ONE TABLET BY MOUTH AT BEDTIME FOR MUSCLE PAIN, SPASM; MAXIMUM 1 PILL A DAY; NO DRIVING FOR 8 HOURS     traMADol (ULTRAM) 50 MG tablet Take 1 tablet by mouth every 8 (eight) hours as needed.     triamcinolone cream (KENALOG) 0.1 % Apply topically.     Ubrogepant 100 MG TABS TAKE  ONE TABLET BY MOUTH DAILY AS NEEDED - NEW MEDICINE FOR MIGRAINE RELIEF     valACYclovir (VALTREX) 1000 MG tablet Take  1/2 tablet by mouth twice daily for 3 days or 1 tablet by mouth every day for 5 days at onset of breakout. 90 tablet 1   valACYclovir (VALTREX) 500 MG tablet TAKE ONE TABLET BY MOUTH TWICE A DAY FOR 3 DAYS WITH HERPES BREAKOUTS     ciprofloxacin (CIPRO) 500 MG tablet Take 1 tablet by mouth every 12 (twelve) hours. (Patient not taking: Reported on 06/04/2022)     clotrimazole-betamethasone (LOTRISONE) cream Use as directed twice per day as needed to the affected area (Patient not taking: Reported on 06/04/2022) 45 g 1   dextromethorphan-guaiFENesin (ROBITUSSIN-DM) 10-100 MG/5ML liquid TAKE 2 TEASPOONFULS BY MOUTH EVERY 6 HOURS AS NEEDED FOR COUGH (Patient not taking: Reported on 06/04/2022)     ketoconazole (NIZORAL) 2 % cream Apply topically. (Patient not taking: Reported on 06/04/2022)     ketotifen (ZADITOR) 0.025 % ophthalmic solution Apply to eye. (Patient not taking: Reported on 06/04/2022)     methocarbamol (ROBAXIN) 500 MG tablet Take 1 tablet (500 mg total) by mouth 4 (four) times daily. (Patient not taking: Reported on 06/04/2022) 60 tablet 2   methylPREDNISolone (MEDROL DOSEPAK) 4 MG TBPK tablet Take as directed (Patient not taking: Reported on 06/04/2022) 21 each 0   metroNIDAZOLE (FLAGYL) 500 MG tablet TAKE 4 TABLETS BY MOUTH AS ONE DOSE (Patient not taking: Reported on 06/04/2022)     phenazopyridine (PYRIDIUM) 200 MG tablet Take 200 mg by mouth 3 (three) times daily as needed. (Patient not taking: Reported on 06/04/2022)     pregabalin (LYRICA) 25 MG capsule Take by mouth. (Patient not taking: Reported on 06/04/2022)     [DISCONTINUED] omeprazole (PRILOSEC) 20 MG capsule Take 1 capsule (20 mg total) by mouth daily. 30 capsule 0   [DISCONTINUED] topiramate (TOPAMAX) 25 MG tablet topiramate 25 mg tablet (Patient not taking: No sig reported)     No current facility-administered medications on  file prior to visit.        ROS:  All others reviewed and negative.  Objective        PE:  BP 126/80 (BP Location: Right Arm, Patient Position: Sitting, Cuff Size: Normal)   Pulse 78   Temp 97.8 F (36.6 C) (Oral)   Ht 5' 6.5" (1.689 m)   Wt 210 lb (95.3 kg)   SpO2 97%   BMI 33.39 kg/m                 Constitutional: Pt appears in NAD               HENT: Head: NCAT.                Right Ear: External ear normal.                 Left Ear: External ear normal.                Eyes: . Pupils are equal, round, and reactive to light. Conjunctivae and EOM are normal               Nose: without d/c or deformity               Neck: Neck supple. Gross normal ROM               Cardiovascular: Normal rate and regular rhythm.                 Pulmonary/Chest: Effort normal and breath sounds without rales or wheezing.  Abd:  Soft, NT, ND, + BS, no organomegaly               Neurological: Pt is alert. At baseline orientation, motor grossly intact               Skin: Skin is warm. No rashes, no other new lesions, LE edema - none               Psychiatric: Pt behavior is normal without agitation   Micro: none  Cardiac tracings I have personally interpreted today:  none  Pertinent Radiological findings (summarize): none   Lab Results  Component Value Date   WBC 4.0 08/19/2022   HGB 15.1 08/19/2022   HCT 44.6 08/19/2022   PLT 230.0 08/19/2022   GLUCOSE 104 (H) 08/19/2022   CHOL 179 08/19/2022   TRIG 89.0 08/19/2022   HDL 54.20 08/19/2022   LDLDIRECT 89.0 04/20/2019   LDLCALC 107 (H) 08/19/2022   ALT 20 08/19/2022   AST 19 08/19/2022   NA 138 08/19/2022   K 4.1 08/19/2022   CL 106 08/19/2022   CREATININE 0.97 08/19/2022   BUN 13 08/19/2022   CO2 23 08/19/2022   TSH 1.48 08/19/2022   PSA 0.53 08/19/2022   HGBA1C 6.1 08/19/2022   Assessment/Plan:  Anthony [name] is a 57 y.o. Black or African American [2] male with  has a past medical history of Allergy, Anemia,  Anxiety, Asthma, Cataract, Chronic kidney disease (2006), Clotting disorder (2016), Complication of anesthesia, COVID-19 virus infection, Depression, DVT (deep venous thrombosis), Fatty liver, Genital herpes (05/06/2018), H/O renal calculi (2006), Headache, History of kidney stones, History of methicillin resistant staphylococcus aureus (MRSA), Hyperlipidemia, Internal hemorrhoid, PE (pulmonary embolism), Peptic ulcer disease, Pneumonia, PTSD (post-traumatic stress disorder), Sleep apnea, Spondylosis, and Tubular adenoma of colon (2011).  Encounter for well adult exam with abnormal findings Age and sex appropriate education and counseling updated with regular exercise and diet Referrals for preventative services - none needed Immunizations addressed - declines covid booster, for shingrix and tdap at pharmacy Smoking counseling  - none needed Evidence for depression or other mood disorder - none significant Most recent labs reviewed. I have personally reviewed and have noted: 1) the patient's medical and social history 2) The patient's current medications and supplements 3) The patient's height, weight, and BMI have been recorded in the chart   Elevated PSA Asympt, for f/u psa  Erectile dysfunction With recent mild worsening, for viagra prn  Low serum testosterone level For fu testosterone  Hyperglycemia Lab Results  Component Value Date   HGBA1C 6.1 08/19/2022   Stable, pt to continue current medical treatment  - diet wt control   Hyperlipidemia Lab Results  Component Value Date   LDLCALC 107 (H) 08/19/2022   Uncontrolled,  pt to incresae lipitor 20 mg qd, lower chol diet  Followup: Return in about 1 week (around 08/25/2022).  Oliver Barre, MD 08/20/2022 8:27 PM [facility]

## 2022-08-19 ENCOUNTER — Other Ambulatory Visit (INDEPENDENT_AMBULATORY_CARE_PROVIDER_SITE_OTHER): Payer: Medicare Other

## 2022-08-19 ENCOUNTER — Other Ambulatory Visit: Payer: Self-pay | Admitting: Internal Medicine

## 2022-08-19 ENCOUNTER — Encounter: Payer: Self-pay | Admitting: Internal Medicine

## 2022-08-19 DIAGNOSIS — E559 Vitamin D deficiency, unspecified: Secondary | ICD-10-CM

## 2022-08-19 DIAGNOSIS — E538 Deficiency of other specified B group vitamins: Secondary | ICD-10-CM

## 2022-08-19 DIAGNOSIS — E785 Hyperlipidemia, unspecified: Secondary | ICD-10-CM

## 2022-08-19 DIAGNOSIS — R972 Elevated prostate specific antigen [PSA]: Secondary | ICD-10-CM

## 2022-08-19 DIAGNOSIS — R739 Hyperglycemia, unspecified: Secondary | ICD-10-CM | POA: Diagnosis not present

## 2022-08-19 LAB — LIPID PANEL
Cholesterol: 179 mg/dL (ref 0–200)
HDL: 54.2 mg/dL (ref >39.00)
LDL Cholesterol: 107 mg/dL — ABNORMAL HIGH (ref 0–99)
NonHDL: 124.95
Total CHOL/HDL Ratio: 3
Triglycerides: 89 mg/dL (ref 0.0–149.0)
VLDL: 17.8 mg/dL (ref 0.0–40.0)

## 2022-08-19 LAB — CBC WITH DIFFERENTIAL/PLATELET
Basophils Absolute: 0.1 10*3/uL (ref 0.0–0.1)
Basophils Relative: 1.3 % (ref 0.0–3.0)
Eosinophils Absolute: 0.4 10*3/uL (ref 0.0–0.7)
Eosinophils Relative: 9.7 % — ABNORMAL HIGH (ref 0.0–5.0)
HCT: 44.6 % (ref 39.0–52.0)
Hemoglobin: 15.1 g/dL (ref 13.0–17.0)
Lymphocytes Relative: 36.4 % (ref 12.0–46.0)
Lymphs Abs: 1.4 10*3/uL (ref 0.7–4.0)
MCHC: 33.9 g/dL (ref 30.0–36.0)
MCV: 83.5 fl (ref 78.0–100.0)
Monocytes Absolute: 0.3 10*3/uL (ref 0.1–1.0)
Monocytes Relative: 8 % (ref 3.0–12.0)
Neutro Abs: 1.8 10*3/uL (ref 1.4–7.7)
Neutrophils Relative %: 44.6 % (ref 43.0–77.0)
Platelets: 230 10*3/uL (ref 150.0–400.0)
RBC: 5.34 Mil/uL (ref 4.22–5.81)
RDW: 15.4 % (ref 11.5–15.5)
WBC: 4 10*3/uL (ref 4.0–10.5)

## 2022-08-19 LAB — HEPATIC FUNCTION PANEL
ALT: 20 U/L (ref 0–53)
AST: 19 U/L (ref 0–37)
Albumin: 4.5 g/dL (ref 3.5–5.2)
Alkaline Phosphatase: 64 U/L (ref 39–117)
Bilirubin, Direct: 0.1 mg/dL (ref 0.0–0.3)
Total Bilirubin: 0.6 mg/dL (ref 0.2–1.2)
Total Protein: 7.2 g/dL (ref 6.0–8.3)

## 2022-08-19 LAB — URINALYSIS, ROUTINE W REFLEX MICROSCOPIC
Bilirubin Urine: NEGATIVE
Hgb urine dipstick: NEGATIVE
Ketones, ur: NEGATIVE
Leukocytes,Ua: NEGATIVE
Nitrite: NEGATIVE
RBC / HPF: NONE SEEN (ref 0–?)
Specific Gravity, Urine: 1.015 (ref 1.000–1.030)
Total Protein, Urine: NEGATIVE
Urine Glucose: NEGATIVE
Urobilinogen, UA: 0.2 (ref 0.0–1.0)
WBC, UA: NONE SEEN (ref 0–?)
pH: 7 (ref 5.0–8.0)

## 2022-08-19 LAB — HEMOGLOBIN A1C: Hgb A1c MFr Bld: 6.1 % (ref 4.6–6.5)

## 2022-08-19 LAB — VITAMIN D 25 HYDROXY (VIT D DEFICIENCY, FRACTURES): VITD: 45.52 ng/mL (ref 30.00–100.00)

## 2022-08-19 LAB — BASIC METABOLIC PANEL
BUN: 13 mg/dL (ref 6–23)
CO2: 23 mEq/L (ref 19–32)
Calcium: 9.3 mg/dL (ref 8.4–10.5)
Chloride: 106 mEq/L (ref 96–112)
Creatinine, Ser: 0.97 mg/dL (ref 0.40–1.50)
GFR: 87.84 mL/min (ref >60.00)
Glucose, Bld: 104 mg/dL — ABNORMAL HIGH (ref 70–99)
Potassium: 4.1 mEq/L (ref 3.5–5.1)
Sodium: 138 mEq/L (ref 135–145)

## 2022-08-19 LAB — TSH: TSH: 1.48 u[IU]/mL (ref 0.35–5.50)

## 2022-08-19 LAB — PSA: PSA: 0.53 ng/mL (ref 0.10–4.00)

## 2022-08-19 LAB — VITAMIN B12: Vitamin B-12: 706 pg/mL (ref 211–911)

## 2022-08-19 MED ORDER — ATORVASTATIN CALCIUM 20 MG PO TABS: 20.0000 mg | ORAL_TABLET | Freq: Every day | ORAL | 3 refills | Status: DC

## 2022-08-20 ENCOUNTER — Encounter: Payer: Self-pay | Admitting: Internal Medicine

## 2022-08-20 NOTE — Assessment & Plan Note (Signed)
Lab Results  Component Value Date   HGBA1C 6.1 08/19/2022   Stable, pt to continue current medical treatment  - diet wt control

## 2022-08-20 NOTE — Assessment & Plan Note (Signed)
Age and sex appropriate education and counseling updated with regular exercise and diet Referrals for preventative services - none needed Immunizations addressed - declines covid booster, for shingrix and tdap at pharmacy Smoking counseling  - none needed Evidence for depression or other mood disorder - none significant Most recent labs reviewed. I have personally reviewed and have noted: 1) the patient's medical and social history 2) The patient's current medications and supplements 3) The patient's height, weight, and BMI have been recorded in the chart

## 2022-08-20 NOTE — Assessment & Plan Note (Signed)
With recent mild worsening, for viagra prn

## 2022-08-20 NOTE — Assessment & Plan Note (Signed)
For f/u testosterone 

## 2022-08-20 NOTE — Assessment & Plan Note (Signed)
Lab Results  Component Value Date   LDLCALC 107 (H) 08/19/2022   Uncontrolled,  pt to incresae lipitor 20 mg qd, lower chol diet

## 2022-08-20 NOTE — Assessment & Plan Note (Signed)
Asympt, for f/u psa 

## 2022-09-07 ENCOUNTER — Other Ambulatory Visit (INDEPENDENT_AMBULATORY_CARE_PROVIDER_SITE_OTHER): Payer: Medicare Other

## 2022-09-07 ENCOUNTER — Other Ambulatory Visit: Payer: Medicare Other

## 2022-09-07 ENCOUNTER — Other Ambulatory Visit: Payer: Federal, State, Local not specified - PPO

## 2022-09-07 DIAGNOSIS — E785 Hyperlipidemia, unspecified: Secondary | ICD-10-CM

## 2022-09-07 LAB — LIPID PANEL
Cholesterol: 170 mg/dL (ref 0–200)
HDL: 49.1 mg/dL (ref >39.00)
LDL Cholesterol: 98 mg/dL (ref 0–99)
NonHDL: 121.26
Total CHOL/HDL Ratio: 3
Triglycerides: 116 mg/dL (ref 0.0–149.0)
VLDL: 23.2 mg/dL (ref 0.0–40.0)

## 2022-09-07 LAB — HEPATIC FUNCTION PANEL
ALT: 17 U/L (ref 0–53)
AST: 17 U/L (ref 0–37)
Albumin: 4 g/dL (ref 3.5–5.2)
Alkaline Phosphatase: 72 U/L (ref 39–117)
Bilirubin, Direct: 0.1 mg/dL (ref 0.0–0.3)
Total Bilirubin: 0.6 mg/dL (ref 0.2–1.2)
Total Protein: 6.8 g/dL (ref 6.0–8.3)

## 2022-09-09 ENCOUNTER — Ambulatory Visit (INDEPENDENT_AMBULATORY_CARE_PROVIDER_SITE_OTHER): Payer: Medicare Other | Admitting: Internal Medicine

## 2022-09-09 ENCOUNTER — Encounter: Payer: Self-pay | Admitting: Internal Medicine

## 2022-09-09 VITALS — BP 124/80 | HR 92 | Temp 97.7°F | Ht 66.5 in | Wt 208.0 lb

## 2022-09-09 DIAGNOSIS — F419 Anxiety disorder, unspecified: Secondary | ICD-10-CM

## 2022-09-09 DIAGNOSIS — E559 Vitamin D deficiency, unspecified: Secondary | ICD-10-CM

## 2022-09-09 DIAGNOSIS — R739 Hyperglycemia, unspecified: Secondary | ICD-10-CM

## 2022-09-09 DIAGNOSIS — E785 Hyperlipidemia, unspecified: Secondary | ICD-10-CM | POA: Diagnosis not present

## 2022-09-09 DIAGNOSIS — J45998 Other asthma: Secondary | ICD-10-CM | POA: Diagnosis not present

## 2022-09-09 NOTE — Progress Notes (Unsigned)
Patient ID: Anthony [name], male   DOB: 05-12-1965, 57 y.o.   MRN: [id number]        Chief Complaint: follow up HTN, HLD and hyperglycemia ***       HPI:  Anthony [name] is a 57 y.o. male here with c/o        Lost   Wt Readings from Last 3 Encounters:  09/09/22 208 lb (94.3 kg)  08/18/22 210 lb (95.3 kg)  06/04/22 210 lb (95.3 kg)   BP Readings from Last 3 Encounters:  09/09/22 124/80  08/18/22 126/80  02/24/22 114/66         Past Medical History:  Diagnosis Date   Allergy    Past hx    Anemia    Anxiety    Asthma    Cataract    removed left- forming on the right  DVT and PE from long travel trip    Chronic kidney disease 2006   kidney stones    Clotting disorder (HCC) 2016   PE - small    Complication of anesthesia    HARD TO WAKE UP AFTER 2ND EYE SURGERY   COVID-19 virus infection    Depression    DVT (deep venous thrombosis) (HCC)    DUE TO A LONG TRAVEL   Fatty liver    Genital herpes 05/06/2018   H/O renal calculi 2006   Headache    MIGRAINES   History of kidney stones    H/O   History of methicillin resistant staphylococcus aureus (MRSA)    Hyperlipidemia    Internal hemorrhoid    PE (pulmonary embolism)    Peptic ulcer disease    Pneumonia    h/o   PTSD (post-traumatic stress disorder)    Sleep apnea    DOES NOT USE CPAP CURRENTLY-MASK DOES NOT FIT CORRECTLY [facility]20[facility]   Spondylosis    Tubular adenoma of colon 2011   Dr Juanda Chance   Past Surgical History:  Procedure Laterality Date   CATARACT EXTRACTION Left 1996   Dr Chales Abrahams; OS   COLONOSCOPY     colonoscopy with polypectomy  2011   Dr Juanda Chance, hemorrhoids   CYSTOSCOPY W/ STONE MANIPULATION  2006   EYE SURGERY Left    INGUINAL HERNIA REPAIR Right 1990   POLYPECTOMY     SHOULDER SURGERY Right 12/17/2010   Dr Madelon Lips ; shoulder impingement & torn tendon   TRANSURETHRAL RESECTION OF PROSTATE N/A 02/23/2020   Procedure: TRANSURETHRAL RESECTION OF THE PROSTATE  (TURP);  Surgeon: Sondra Come, MD;  Location: [facility] ORS;  Service: Urology;  Laterality: N/A;    reports that he has never smoked. He has never used smokeless tobacco. He reports current alcohol use. He reports that he does not use drugs. family history includes Cancer in his paternal grandmother; Dementia in his father and maternal aunt; Diabetes in his father; Esophageal cancer in his maternal uncle; Glaucoma in his father; Heart attack in his maternal uncle; Heart attack (age of onset: 53) in his mother; Heart attack (age of onset: 29) in his maternal grandmother; Stroke (age of onset: 37) in his father. Allergies  Allergen Reactions   Aleve [Naproxen] Rash    Patient stated it was years ago   Nsaids Other (See Comments)    Bleeding ulcers   Current Outpatient Medications on File Prior to Visit  Medication Sig Dispense Refill   acetaminophen (TYLENOL) 500 MG tablet Take 1,000 mg by mouth  every 6 (six) hours as needed for moderate pain.     albuterol (PROVENTIL HFA;VENTOLIN HFA) 108 (90 Base) MCG/ACT inhaler Inhale 2 puffs into the lungs every 6 (six) hours as needed. For shortness of breath 1 Inhaler 1   aspirin EC 81 MG tablet Take 81 mg by mouth daily.     atorvastatin (LIPITOR) 20 MG tablet Take 1 tablet (20 mg total) by mouth daily. 90 tablet 3   Brinzolamide-Brimonidine 1-0.2 % SUSP Place 1 drop into the left eye 2 (two) times daily.      budesonide-formoterol (SYMBICORT) 160-4.5 MCG/ACT inhaler Inhale 2 puffs then rinse mouth, twice every day 1 each 12   busPIRone (BUSPAR) 10 MG tablet      busPIRone (BUSPAR) 15 MG tablet TAKE ONE TABLET BY MOUTH TWICE A DAY FOR ANXIETY FOR ANXIETY     carboxymethylcellulose (REFRESH PLUS) 0.5 % SOLN Apply to eye.     cetirizine (ZYRTEC) 10 MG tablet Take 1 tablet by mouth daily as needed.     Cholecalciferol (VITAMIN D3 PO) Take 1 tablet by mouth daily.     ciprofloxacin (CIPRO) 500 MG tablet Take 1 tablet by mouth every 12 (twelve) hours.      clotrimazole-betamethasone (LOTRISONE) cream Use as directed twice per day as needed to the affected area 45 g 1   dextromethorphan-guaiFENesin (ROBITUSSIN-DM) 10-100 MG/5ML liquid      diclofenac Sodium (VOLTAREN) 1 % GEL Apply topically.     fluticasone (FLONASE) 50 MCG/ACT nasal spray Place into the nose.     ketoconazole (NIZORAL) 2 % cream Apply topically.     ketotifen (ZADITOR) 0.025 % ophthalmic solution Apply to eye.     methocarbamol (ROBAXIN) 500 MG tablet Take 1 tablet (500 mg total) by mouth 4 (four) times daily. 60 tablet 2   methylPREDNISolone (MEDROL DOSEPAK) 4 MG TBPK tablet Take as directed 21 each 0   metroNIDAZOLE (FLAGYL) 500 MG tablet      Multiple Vitamin (MULTIVITAMIN) tablet Take 1 tablet by mouth daily.     nortriptyline (PAMELOR) 25 MG capsule Take 1 capsule (25 mg total) by mouth at bedtime. 30 capsule 5   ondansetron (ZOFRAN-ODT) 8 MG disintegrating tablet Take 1 tablet by mouth every 12 (twelve) hours.     OXcarbazepine (TRILEPTAL) 150 MG tablet Take 150 mg by mouth as needed (FOR ANXIETY/PTSD).     PARoxetine (PAXIL) 40 MG tablet TAKE ONE-HALF TABLET BY MOUTH DAILY FOR MENTAL HEALTH     phenazopyridine (PYRIDIUM) 200 MG tablet Take 200 mg by mouth 3 (three) times daily as needed.     pregabalin (LYRICA) 25 MG capsule Take by mouth.     riboflavin (VITAMIN B-2) 100 MG TABS tablet TAKE FOUR TABLETS BY MOUTH DAILY FOR MIGRAINE PREVENTION     sertraline (ZOLOFT) 100 MG tablet TAKE ONE-HALF TABLET BY MOUTH IN THE MORNING FOR PTSD/MOOD FOR MENTAL HEALTH START IN 8 DAYS, AFTER DISCONTINUING PAROXETINE     sildenafil (VIAGRA) 100 MG tablet TAKE ONE TABLET BY MOUTH AS INSTRUCTED AS NEEDED ERECTILE DYSFUNCTION (TAKE 1 HOUR PRIOR TO SEXUAL ACTIVITY *DO NOT EXCEED 1 DOSE PER 24 HOUR PERIOD*)     tiZANidine (ZANAFLEX) 4 MG tablet TAKE ONE TABLET BY MOUTH AT BEDTIME FOR MUSCLE PAIN, SPASM; MAXIMUM 1 PILL A DAY; NO DRIVING FOR 8 HOURS     traMADol (ULTRAM) 50 MG tablet Take 1  tablet by mouth every 8 (eight) hours as needed.     triamcinolone cream (KENALOG) 0.1 % Apply  topically.     Ubrogepant 100 MG TABS TAKE ONE TABLET BY MOUTH DAILY AS NEEDED - NEW MEDICINE FOR MIGRAINE RELIEF     valACYclovir (VALTREX) 1000 MG tablet Take 1/2 tablet by mouth twice daily for 3 days or 1 tablet by mouth every day for 5 days at onset of breakout. 90 tablet 1   valACYclovir (VALTREX) 500 MG tablet TAKE ONE TABLET BY MOUTH TWICE A DAY FOR 3 DAYS WITH HERPES BREAKOUTS     [DISCONTINUED] omeprazole (PRILOSEC) 20 MG capsule Take 1 capsule (20 mg total) by mouth daily. 30 capsule 0   [DISCONTINUED] topiramate (TOPAMAX) 25 MG tablet topiramate 25 mg tablet (Patient not taking: No sig reported)     No current facility-administered medications on file prior to visit.        ROS:  All others reviewed and negative.  Objective        PE:  BP 124/80 (BP Location: Right Arm, Patient Position: Sitting, Cuff Size: Normal)   Pulse 92   Temp 97.7 F (36.5 C) (Oral)   Ht 5' 6.5" (1.689 m)   Wt 208 lb (94.3 kg)   SpO2 97%   BMI 33.07 kg/m                 Constitutional: Pt appears in NAD               HENT: Head: NCAT.                Right Ear: External ear normal.                 Left Ear: External ear normal.                Eyes: . Pupils are equal, round, and reactive to light. Conjunctivae and EOM are normal               Nose: without d/c or deformity               Neck: Neck supple. Gross normal ROM               Cardiovascular: Normal rate and regular rhythm.                 Pulmonary/Chest: Effort normal and breath sounds without rales or wheezing.                Abd:  Soft, NT, ND, + BS, no organomegaly               Neurological: Pt is alert. At baseline orientation, motor grossly intact               Skin: Skin is warm. No rashes, no other new lesions, LE edema - ***               Psychiatric: Pt behavior is normal without agitation   Micro: none  Cardiac tracings I have  personally interpreted today:  none  Pertinent Radiological findings (summarize): none   Lab Results  Component Value Date   WBC 4.0 08/19/2022   HGB 15.1 08/19/2022   HCT 44.6 08/19/2022   PLT 230.0 08/19/2022   GLUCOSE 104 (H) 08/19/2022   CHOL 170 09/07/2022   TRIG 116.0 09/07/2022   HDL 49.10 09/07/2022   LDLDIRECT 89.0 04/20/2019   LDLCALC 98 09/07/2022   ALT 17 09/07/2022   AST 17 09/07/2022   NA 138 08/19/2022   K 4.1 08/19/2022   CL 106  08/19/2022   CREATININE 0.97 08/19/2022   BUN 13 08/19/2022   CO2 23 08/19/2022   TSH 1.48 08/19/2022   PSA 0.53 08/19/2022   HGBA1C 6.1 08/19/2022   Assessment/Plan:  Anthony [name] is a 57 y.o. Black or African American [2] male with  has a past medical history of Allergy, Anemia, Anxiety, Asthma, Cataract, Chronic kidney disease (2006), Clotting disorder (HCC) (2016), Complication of anesthesia, COVID-19 virus infection, Depression, DVT (deep venous thrombosis) (HCC), Fatty liver, Genital herpes (05/06/2018), H/O renal calculi (2006), Headache, History of kidney stones, History of methicillin resistant staphylococcus aureus (MRSA), Hyperlipidemia, Internal hemorrhoid, PE (pulmonary embolism), Peptic ulcer disease, Pneumonia, PTSD (post-traumatic stress disorder), Sleep apnea, Spondylosis, and Tubular adenoma of colon (2011).  No problem-specific Assessment & Plan notes found for this encounter.  Followup: No follow-ups on file.  Oliver Barre, MD 09/09/2022 1:37 PM [facility]  Primary Care - [facility]

## 2022-09-09 NOTE — Patient Instructions (Signed)
Please continue all other medications as before, including the omega 3 if you like to take this, as your cholesterol was improved!  Please have the pharmacy call with any other refills you may need.  Please continue your efforts at being more active, low cholesterol diet, and weight control.  You are otherwise up to date with prevention measures today.  Please keep your appointments with your specialists as you may have planned  Please make an Appointment to return in 6 months, or sooner if needed, also with Lab Appointment for testing done 3-5 days before at the FIRST FLOOR Lab (so this is for TWO appointments - please see the scheduling desk as you leave)

## 2022-09-10 ENCOUNTER — Ambulatory Visit: Payer: Medicare Other | Admitting: Urology

## 2022-09-10 ENCOUNTER — Encounter: Payer: Self-pay | Admitting: Internal Medicine

## 2022-09-10 NOTE — Assessment & Plan Note (Signed)
Stable overall, to continue inhaler prn °

## 2022-09-10 NOTE — Assessment & Plan Note (Signed)
Chronic mild persistent, cont trileptal 150 qd

## 2022-09-10 NOTE — Assessment & Plan Note (Signed)
Lab Results  Component Value Date   LDLCALC 98 09/07/2022   Improved and stable with diet and recent omega 3 use otc,, pt to continue same, alsof ro f/u nutrition at [facility] as planned

## 2022-09-10 NOTE — Assessment & Plan Note (Signed)
Lab Results  Component Value Date   HGBA1C 6.1 08/19/2022   Stable, pt to continue current medical treatment  - diet, wt control  

## 2022-09-16 ENCOUNTER — Ambulatory Visit: Payer: Federal, State, Local not specified - PPO | Admitting: Urology

## 2022-09-17 ENCOUNTER — Encounter: Payer: Self-pay | Admitting: Urology

## 2022-09-17 ENCOUNTER — Ambulatory Visit (INDEPENDENT_AMBULATORY_CARE_PROVIDER_SITE_OTHER): Payer: Medicare Other | Admitting: Urology

## 2022-09-17 VITALS — BP 120/70 | HR 90 | Ht 66.5 in | Wt 203.0 lb

## 2022-09-17 DIAGNOSIS — R350 Frequency of micturition: Secondary | ICD-10-CM

## 2022-09-17 DIAGNOSIS — N529 Male erectile dysfunction, unspecified: Secondary | ICD-10-CM | POA: Diagnosis not present

## 2022-09-17 DIAGNOSIS — Z125 Encounter for screening for malignant neoplasm of prostate: Secondary | ICD-10-CM

## 2022-09-17 LAB — BLADDER SCAN AMB NON-IMAGING

## 2022-09-17 MED ORDER — TADALAFIL 20 MG PO TABS: 10.0000 mg | ORAL_TABLET | Freq: Every day | ORAL | 6 refills | Status: AC | PRN

## 2022-09-17 NOTE — Progress Notes (Signed)
   09/17/2022 10:57 AM   Anthony [name] [birth year] [id number]  Reason for visit: Dysuria, history of prostate cyst, ED, PSA screening  HPI: 57 year old male with history of urinary symptoms of dysuria who was found to have a prostatic cyst, and ultimately underwent TUR of this lesion with me in October 2021.  Pathology showed only cystitis cystica.  This had resolved his burning with urination and other urinary symptoms.  He denies any urinary problems over the last year, specifically no dysuria, gross hematuria, or UTIs.  In the past had been seen at multiple urgent cares in 2022/2023 for dysuria but refused STD testing at those visits, ultimately was treated with a course of Cipro at an urgent care that resolved his symptoms.  PVR today is normal at 15ml.  I reviewed his recent lab work with PCP from April 2024.  Urinalysis completely benign, PSA normal at 0.53 and stable over the last 5 years.  We discussed the risk and benefits of PSA screening, and he will continue screening with PCP.  He also had questions about ED today.  He is using sildenafil 100 mg on demand with mixed results, also has bothersome headache from that medication.  I recommended a trial of Cialis 10 to 20 mg on demand, and risks and benefits were discussed.  He also had a number of questions about over-the-counter supplements, and I recommended he avoid these as they are not regulated by the FDA.  It sounds like he also has some issues relationship wise that may be a bigger factor.  Trial of Cialis 10 to 20 mg as needed for ED to replace sildenafil PSA normal, continue screening with PCP RTC 1 year PVR  Sondra Come, MD  [facility] [address] [place], [place] [address] [phone]

## 2022-09-23 ENCOUNTER — Ambulatory Visit: Payer: Self-pay

## 2022-09-23 ENCOUNTER — Emergency Department
Admission: EM | Admit: 2022-09-23 | Discharge: 2022-09-23 | Disposition: A | Discharge status: Home / Self Care | Payer: No Typology Code available for payment source | Attending: Emergency Medicine | Admitting: Emergency Medicine

## 2022-09-23 ENCOUNTER — Other Ambulatory Visit: Payer: Self-pay

## 2022-09-23 ENCOUNTER — Emergency Department: Payer: No Typology Code available for payment source

## 2022-09-23 DIAGNOSIS — M79605 Pain in left leg: Secondary | ICD-10-CM | POA: Insufficient documentation

## 2022-09-23 DIAGNOSIS — N189 Chronic kidney disease, unspecified: Secondary | ICD-10-CM | POA: Insufficient documentation

## 2022-09-23 NOTE — ED Notes (Signed)
Called for pt multiple times in lobby. Will check to see if pt in [place].

## 2022-09-23 NOTE — Discharge Instructions (Signed)
Please continue taking Valtrex for the next 1 week.  Please follow-up with your doctor for recheck/reevaluation.  Return to the emergency department for any symptom concerning to your self.

## 2022-09-23 NOTE — ED Notes (Signed)
Pt to room 48 now with [place] staff.

## 2022-09-23 NOTE — ED Provider Notes (Signed)
   [facility] Provider Note    Event Date/Time   First MD Initiated Contact with Patient 09/23/22 1003     (approximate)  History   Chief Complaint: Leg Pain  HPI  Anthony [name] is a 57 y.o. male with a past medical history of prior DVT/PE, hyperlipidemia, CKD, genital herpes, presents to the emergency department for left leg pain.  According to the patient over the past few days he has been experiencing some pain in the left inner thigh.  Patient states in the past he has had similar discomfort with herpes outbreaks however he states no outbreak currently.  Patient also states history of DVT as well as PE previously.  Patient denies any chest pain no trouble breathing.  Physical Exam   Triage Vital Signs: ED Triage Vitals  Enc Vitals Group     BP 09/23/22 0829 109/84     Pulse Rate 09/23/22 0829 69     Resp 09/23/22 0829 16     Temp 09/23/22 0829 98.1 F (36.7 C)     Temp src --      SpO2 09/23/22 0829 95 %     Weight 09/23/22 0830 207 lb (93.9 kg)     Height 09/23/22 0830 5\' 7"  (1.702 m)     Head Circumference --      Peak Flow --      Pain Score 09/23/22 0829 6     Pain Loc --      Pain Edu? --      Excl. in GC? --     Most recent vital signs: Vitals:   09/23/22 0829  BP: 109/84  Pulse: 69  Resp: 16  Temp: 98.1 F (36.7 C)  SpO2: 95%    General: Awake, no distress.  CV:  Good peripheral perfusion.   Resp:  Normal effort.   Abd:  No distention.  Other:  Patient has no tenderness to palpation over the left inguinal area but does state mild tenderness over the left medial thigh.  No popliteal tenderness.  No signs of any rash or herpetic lesions.  No obvious lymphadenopathy.   ED Results / Procedures / Treatments   RADIOLOGY  Ultrasound has resulted negative for DVT.   MEDICATIONS ORDERED IN ED: Medications - No data to display   IMPRESSION / MDM / ASSESSMENT AND PLAN / ED COURSE  I reviewed the triage vital signs and the  nursing notes.  Patient's presentation is most consistent with acute presentation with potential threat to life or bodily function.  Patient presents to the emergency department for left leg discomfort.  History of DVT previously.  Also states he had similar discomfort with herpes outbreaks but no current rash or lesions.  Patient's ultrasound reassuringly is negative for DVT.  Given the negative ultrasound I discussed with the patient to continue taking Valtrex for the next 1 week and to continue closely monitoring this area.  He will return to the emergency department for any symptom concerning to his self.  Patient is agreeable to this plan of care otherwise will be following up with his doctor.  FINAL CLINICAL IMPRESSION(S) / ED DIAGNOSES   Left leg pain   Note:  This document was prepared using Dragon voice recognition software and may include unintentional dictation errors.   Minna Antis, MD 09/23/22 1013

## 2022-09-23 NOTE — ED Notes (Addendum)
Pt in with "a sensation" to L thigh; states discomfort started at lateral thigh and shifted into medial thigh; when this RN tries to get pt to further describe pain pt cannot put a descriptor word to it other than "sensation"; pt denies swelling, change of skin color, heat or coolness at site; pt's skin dry, resp reg/unlabored and is sitting calmly in wheelchair. Denies change in ability to feel in limbs; denies tingling/numbness.

## 2022-09-23 NOTE — ED Triage Notes (Signed)
Pt to ED from [facility] for left leg pain x1 week. Denies redness or swelling. Reports increased pain with walking.  Hx DVT Denies injuries

## 2022-09-25 ENCOUNTER — Encounter: Payer: Self-pay | Admitting: Internal Medicine

## 2022-09-25 ENCOUNTER — Ambulatory Visit (INDEPENDENT_AMBULATORY_CARE_PROVIDER_SITE_OTHER): Payer: Federal, State, Local not specified - PPO | Admitting: Internal Medicine

## 2022-09-25 VITALS — BP 120/84 | HR 95 | Temp 97.8°F | Ht 67.0 in | Wt 210.0 lb

## 2022-09-25 DIAGNOSIS — R739 Hyperglycemia, unspecified: Secondary | ICD-10-CM

## 2022-09-25 DIAGNOSIS — M79605 Pain in left leg: Secondary | ICD-10-CM | POA: Diagnosis not present

## 2022-09-25 DIAGNOSIS — J45998 Other asthma: Secondary | ICD-10-CM | POA: Diagnosis not present

## 2022-09-25 DIAGNOSIS — F419 Anxiety disorder, unspecified: Secondary | ICD-10-CM

## 2022-09-25 MED ORDER — MELOXICAM 15 MG PO TABS: 15.0000 mg | ORAL_TABLET | Freq: Every day | ORAL | 1 refills | Status: DC | PRN

## 2022-09-25 NOTE — Patient Instructions (Signed)
Please take all new medication as prescribed - the anti inflammatory for pain  Please continue all other medications as before, and refills have been done if requested.  Please have the pharmacy call with any other refills you may need.  Please continue your efforts at being more active, low cholesterol diet, and weight control.  Please keep your appointments with your specialists as you may have planned

## 2022-09-25 NOTE — Progress Notes (Unsigned)
Patient ID: Anthony [name], male   DOB: January 07, 1966, 57 y.o.   MRN: [id number]        Chief Complaint: follow up left medial localized mid upper leg pain,asthma, anxiety, hyperglycemia       HPI:  [name] is a 57 y.o. male here with c/o focal localized left medial left upper leg pain, tender to palpate a certain spot, but no skin change, sweling, ulcer or swelling; worse to walk, had venous doppler neg 5.22 wondering now if needs more tests.  Pt denies chest pain, increased sob or doe, wheezing, orthopnea, PND, increased LE swelling, palpitations, dizziness or syncope.   Pt denies polydipsia, polyuria, or new focal neuro s/s.    Pt denies fever, wt loss, night sweats, loss of appetite, or other constitutional symptoms  Denies worsening depressive symptoms, suicidal ideation, or panic       Wt Readings from Last 3 Encounters:  09/25/22 210 lb (95.3 kg)  09/23/22 207 lb (93.9 kg)  09/17/22 203 lb (92.1 kg)   BP Readings from Last 3 Encounters:  09/25/22 120/84  09/23/22 109/84  09/17/22 120/70         Past Medical History:  Diagnosis Date   Allergy    Past hx    Anemia    Anxiety    Asthma    Cataract    removed left- forming on the right  DVT and PE from long travel trip    Chronic kidney disease 2006   kidney stones    Clotting disorder (HCC) 2016   PE - small    Complication of anesthesia    HARD TO WAKE UP AFTER 2ND EYE SURGERY   COVID-19 virus infection    Depression    DVT (deep venous thrombosis) (HCC)    DUE TO A LONG TRAVEL   Fatty liver    Genital herpes 05/06/2018   H/O renal calculi 2006   Headache    MIGRAINES   History of kidney stones    H/O   History of methicillin resistant staphylococcus aureus (MRSA)    Hyperlipidemia    Internal hemorrhoid    PE (pulmonary embolism)    Peptic ulcer disease    Pneumonia    h/o   PTSD (post-traumatic stress disorder)    Sleep apnea    DOES NOT USE CPAP CURRENTLY-[facility] NOT [facility]20)   Spondylosis    Tubular adenoma of colon 2011   Dr Juanda Chance   Past Surgical History:  Procedure Laterality Date   CATARACT EXTRACTION Left 1996   Dr Chales Abrahams; OS   COLONOSCOPY     colonoscopy with polypectomy  2011   Dr Juanda Chance, hemorrhoids   CYSTOSCOPY W/ STONE MANIPULATION  2006   EYE SURGERY Left    INGUINAL HERNIA REPAIR Right 1990   POLYPECTOMY     SHOULDER SURGERY Right 12/17/2010   Dr Madelon Lips ; shoulder impingement & torn tendon   TRANSURETHRAL RESECTION OF PROSTATE N/A 02/23/2020   Procedure: TRANSURETHRAL RESECTION OF THE PROSTATE (TURP);  Surgeon: Sondra Come, MD;  Location: [facility] ORS;  Service: Urology;  Laterality: N/A;    reports that he has never smoked. He has never used smokeless tobacco. He reports current alcohol use. He reports that he does not use drugs. family history includes Cancer in his paternal grandmother; Dementia in his father and maternal aunt; Diabetes in his father; Esophageal cancer in his maternal uncle; Glaucoma  in his father; Heart attack in his maternal uncle; Heart attack (age of onset: 75) in his mother; Heart attack (age of onset: 30) in his maternal grandmother; Stroke (age of onset: 71) in his father. Allergies  Allergen Reactions   Aleve [Naproxen] Rash    Patient stated it was years ago   Nsaids Other (See Comments)    Bleeding ulcers   Current Outpatient Medications on File Prior to Visit  Medication Sig Dispense Refill   acetaminophen (TYLENOL) 500 MG tablet Take 1,000 mg by mouth every 6 (six) hours as needed for moderate pain.     albuterol (PROVENTIL HFA;VENTOLIN HFA) 108 (90 Base) MCG/ACT inhaler Inhale 2 puffs into the lungs every 6 (six) hours as needed. For shortness of breath 1 Inhaler 1   aspirin EC 81 MG tablet Take 81 mg by mouth daily.     atorvastatin (LIPITOR) 20 MG tablet Take 1 tablet (20 mg total) by mouth daily. 90 tablet 3   Brinzolamide-Brimonidine 1-0.2 % SUSP Place 1 drop into the left eye  2 (two) times daily.      budesonide-formoterol (SYMBICORT) 160-4.5 MCG/ACT inhaler Inhale 2 puffs then rinse mouth, twice every day 1 each 12   busPIRone (BUSPAR) 10 MG tablet      busPIRone (BUSPAR) 15 MG tablet TAKE ONE TABLET BY MOUTH TWICE A DAY FOR ANXIETY FOR ANXIETY     carboxymethylcellulose (REFRESH PLUS) 0.5 % SOLN Apply to eye.     cetirizine (ZYRTEC) 10 MG tablet Take 1 tablet by mouth daily as needed.     Cholecalciferol (VITAMIN D3 PO) Take 1 tablet by mouth daily.     ciprofloxacin (CIPRO) 500 MG tablet Take 1 tablet by mouth every 12 (twelve) hours.     clotrimazole-betamethasone (LOTRISONE) cream Use as directed twice per day as needed to the affected area 45 g 1   dextromethorphan-guaiFENesin (ROBITUSSIN-DM) 10-100 MG/5ML liquid      diclofenac Sodium (VOLTAREN) 1 % GEL Apply topically.     fluticasone (FLONASE) 50 MCG/ACT nasal spray Place into the nose.     ketoconazole (NIZORAL) 2 % cream Apply topically.     ketotifen (ZADITOR) 0.025 % ophthalmic solution Apply to eye.     methocarbamol (ROBAXIN) 500 MG tablet Take 1 tablet (500 mg total) by mouth 4 (four) times daily. 60 tablet 2   methylPREDNISolone (MEDROL DOSEPAK) 4 MG TBPK tablet Take as directed 21 each 0   metroNIDAZOLE (FLAGYL) 500 MG tablet      Multiple Vitamin (MULTIVITAMIN) tablet Take 1 tablet by mouth daily.     nortriptyline (PAMELOR) 25 MG capsule Take 1 capsule (25 mg total) by mouth at bedtime. 30 capsule 5   ondansetron (ZOFRAN-ODT) 8 MG disintegrating tablet Take 1 tablet by mouth every 12 (twelve) hours.     OXcarbazepine (TRILEPTAL) 150 MG tablet Take 150 mg by mouth as needed (FOR ANXIETY/PTSD).     PARoxetine (PAXIL) 40 MG tablet TAKE ONE-HALF TABLET BY MOUTH DAILY FOR MENTAL HEALTH     phenazopyridine (PYRIDIUM) 200 MG tablet Take 200 mg by mouth 3 (three) times daily as needed.     pregabalin (LYRICA) 25 MG capsule Take by mouth.     riboflavin (VITAMIN B-2) 100 MG TABS tablet TAKE FOUR TABLETS  BY MOUTH DAILY FOR MIGRAINE PREVENTION     sertraline (ZOLOFT) 100 MG tablet TAKE ONE-HALF TABLET BY MOUTH IN THE MORNING FOR PTSD/MOOD FOR MENTAL HEALTH START IN 8 DAYS, AFTER DISCONTINUING PAROXETINE  sildenafil (VIAGRA) 100 MG tablet TAKE ONE TABLET BY MOUTH AS INSTRUCTED AS NEEDED ERECTILE DYSFUNCTION (TAKE 1 HOUR PRIOR TO SEXUAL ACTIVITY *DO NOT EXCEED 1 DOSE PER 24 HOUR PERIOD*)     tadalafil (CIALIS) 20 MG tablet Take 0.5-1 tablets (10-20 mg total) by mouth daily as needed for erectile dysfunction (take 45 minutes prior to sexual activity). 30 tablet 6   tiZANidine (ZANAFLEX) 4 MG tablet TAKE ONE TABLET BY MOUTH AT BEDTIME FOR MUSCLE PAIN, SPASM; MAXIMUM 1 PILL A DAY; NO DRIVING FOR 8 HOURS     traMADol (ULTRAM) 50 MG tablet Take 1 tablet by mouth every 8 (eight) hours as needed.     triamcinolone cream (KENALOG) 0.1 % Apply topically.     Ubrogepant 100 MG TABS TAKE ONE TABLET BY MOUTH DAILY AS NEEDED - NEW MEDICINE FOR MIGRAINE RELIEF     valACYclovir (VALTREX) 1000 MG tablet Take 1/2 tablet by mouth twice daily for 3 days or 1 tablet by mouth every day for 5 days at onset of breakout. 90 tablet 1   valACYclovir (VALTREX) 500 MG tablet TAKE ONE TABLET BY MOUTH TWICE A DAY FOR 3 DAYS WITH HERPES BREAKOUTS     [DISCONTINUED] omeprazole (PRILOSEC) 20 MG capsule Take 1 capsule (20 mg total) by mouth daily. 30 capsule 0   [DISCONTINUED] topiramate (TOPAMAX) 25 MG tablet topiramate 25 mg tablet (Patient not taking: No sig reported)     No current facility-administered medications on file prior to visit.        ROS:  All others reviewed and negative.  Objective        PE:  BP 120/84 (BP Location: Right Arm, Patient Position: Sitting, Cuff Size: Normal)   Pulse 95   Temp 97.8 F (36.6 C) (Oral)   Ht 5\' 7"  (1.702 m)   Wt 210 lb (95.3 kg)   SpO2 98%   BMI 32.89 kg/m                 Constitutional: Pt appears in NAD               HENT: Head: NCAT.                Right Ear: External  ear normal.                 Left Ear: External ear normal.                Eyes: . Pupils are equal, round, and reactive to light. Conjunctivae and EOM are normal               Nose: without d/c or deformity               Neck: Neck supple. Gross normal ROM               Cardiovascular: Normal rate and regular rhythm.                 Pulmonary/Chest: Effort normal and breath sounds without rales or wheezing.                Abd:  Soft, NT, ND, + BS, no organomegaly               Neurological: Pt is alert. At baseline orientation, motor grossly intact               Skin: Skin is warm. No rashes, no other new lesions, LE edema - none  Psychiatric: Pt behavior is normal without agitation , mod nervous  Micro: none  Cardiac tracings I have personally interpreted today:  none  Pertinent Radiological findings (summarize): none   Lab Results  Component Value Date   WBC 4.0 08/19/2022   HGB 15.1 08/19/2022   HCT 44.6 08/19/2022   PLT 230.0 08/19/2022   GLUCOSE 104 (H) 08/19/2022   CHOL 170 09/07/2022   TRIG 116.0 09/07/2022   HDL 49.10 09/07/2022   LDLDIRECT 89.0 04/20/2019   LDLCALC 98 09/07/2022   ALT 17 09/07/2022   AST 17 09/07/2022   NA 138 08/19/2022   K 4.1 08/19/2022   CL 106 08/19/2022   CREATININE 0.97 08/19/2022   BUN 13 08/19/2022   CO2 23 08/19/2022   TSH 1.48 08/19/2022   PSA 0.53 08/19/2022   HGBA1C 6.1 08/19/2022   Assessment/Plan:  DETRICH FAYER is a 57 y.o. Black or African American [2] male with  has a past medical history of Allergy, Anemia, Anxiety, Asthma, Cataract, Chronic kidney disease (2006), Clotting disorder (HCC) (2016), Complication of anesthesia, COVID-19 virus infection, Depression, DVT (deep venous thrombosis) (HCC), Fatty liver, Genital herpes (05/06/2018), H/O renal calculi (2006), Headache, History of kidney stones, History of methicillin resistant staphylococcus aureus (MRSA), Hyperlipidemia, Internal hemorrhoid, PE (pulmonary  embolism), Peptic ulcer disease, Pneumonia, PTSD (post-traumatic stress disorder), Sleep apnea, Spondylosis, and Tubular adenoma of colon (2011).  Anxiety Chronic mild persistent, declines need for change in tx or counseling  Other asthma Stable, cont inhaler prn  Hyperglycemia Lab Results  Component Value Date   HGBA1C 6.1 08/19/2022   Stable, pt to continue current medical treatment - diet, wt control   Pain in left leg C/w msk pain, for mobic 15 qd prn, consider sport med if not improved  Followup: Return if symptoms worsen or fail to improve.  Oliver Barre, MD 09/27/2022 8:53 PM [facility]

## 2022-09-27 ENCOUNTER — Encounter: Payer: Self-pay | Admitting: Internal Medicine

## 2022-09-27 NOTE — Assessment & Plan Note (Signed)
Chronic mild persistent, declines need for change in tx or counseling 

## 2022-09-27 NOTE — Assessment & Plan Note (Signed)
Lab Results  Component Value Date   HGBA1C 6.1 08/19/2022   Stable, pt to continue current medical treatment  - diet, wt control  

## 2022-09-27 NOTE — Assessment & Plan Note (Signed)
Stable , cont inhaler prn 

## 2022-09-27 NOTE — Assessment & Plan Note (Signed)
C/w msk pain, for mobic 15 qd prn, consider sport med if not improved

## 2022-09-29 MED ORDER — PANTOPRAZOLE SODIUM 40 MG PO TBEC: 40.0000 mg | DELAYED_RELEASE_TABLET | Freq: Every day | ORAL | 5 refills | Status: DC

## 2022-09-29 NOTE — Telephone Encounter (Signed)
Pt states he can not take the Meloxicam that was rx for him.Marland KitchenRaechel Chute
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
## 2022-10-07 ENCOUNTER — Ambulatory Visit (INDEPENDENT_AMBULATORY_CARE_PROVIDER_SITE_OTHER): Payer: Medicare Other | Admitting: Neurology

## 2022-10-07 ENCOUNTER — Telehealth: Payer: Self-pay

## 2022-10-07 ENCOUNTER — Encounter: Payer: Self-pay | Admitting: Neurology

## 2022-10-07 VITALS — BP 115/77 | HR 69 | Ht 66.0 in | Wt 208.0 lb

## 2022-10-07 DIAGNOSIS — G44219 Episodic tension-type headache, not intractable: Secondary | ICD-10-CM

## 2022-10-07 DIAGNOSIS — G43009 Migraine without aura, not intractable, without status migrainosus: Secondary | ICD-10-CM | POA: Diagnosis not present

## 2022-10-07 NOTE — Telephone Encounter (Signed)
done

## 2022-10-07 NOTE — Telephone Encounter (Signed)
DMV forms window tint  received.

## 2022-10-07 NOTE — Patient Instructions (Addendum)
Nortriptyline 25mg  at bedtime Ubrelvy as needed for migriane Tylenol with tizandine for tension headache Follow up

## 2022-10-14 NOTE — Telephone Encounter (Signed)
Pt called in and left a message with the access nurse. He said he left some forms to be filled out at his last visit and would like to find out if they have been completed.

## 2022-10-15 NOTE — Telephone Encounter (Signed)
F/u  Patient checking on the status of forms, asking for a call back to discuss

## 2022-10-16 NOTE — Telephone Encounter (Signed)
Forms faxed and LMOVM for patient to advise.

## 2022-10-19 ENCOUNTER — Encounter: Payer: Self-pay | Admitting: Internal Medicine

## 2022-10-21 MED ORDER — ATORVASTATIN CALCIUM 40 MG PO TABS: 40.0000 mg | ORAL_TABLET | Freq: Every day | ORAL | 3 refills | Status: DC

## 2022-10-21 NOTE — Addendum Note (Signed)
Addended by: Corwin Levins on: 10/21/2022 12:20 PM   Modules accepted: Orders

## 2022-11-06 ENCOUNTER — Other Ambulatory Visit: Payer: Self-pay | Admitting: Internal Medicine

## 2022-11-12 ENCOUNTER — Encounter: Payer: Self-pay | Admitting: Internal Medicine

## 2022-11-13 ENCOUNTER — Encounter: Payer: Self-pay | Admitting: Internal Medicine

## 2022-11-13 ENCOUNTER — Ambulatory Visit (INDEPENDENT_AMBULATORY_CARE_PROVIDER_SITE_OTHER): Payer: Medicare Other | Admitting: Internal Medicine

## 2022-11-13 VITALS — BP 120/78 | HR 74 | Temp 98.3°F | Ht 66.0 in | Wt 206.0 lb

## 2022-11-13 DIAGNOSIS — R110 Nausea: Secondary | ICD-10-CM

## 2022-11-13 DIAGNOSIS — F419 Anxiety disorder, unspecified: Secondary | ICD-10-CM

## 2022-11-13 DIAGNOSIS — R42 Dizziness and giddiness: Secondary | ICD-10-CM

## 2022-11-13 DIAGNOSIS — U071 COVID-19: Secondary | ICD-10-CM | POA: Diagnosis not present

## 2022-11-13 DIAGNOSIS — J45998 Other asthma: Secondary | ICD-10-CM

## 2022-11-13 LAB — POC COVID19 BINAXNOW: SARS Coronavirus 2 Ag: POSITIVE — AB

## 2022-11-13 MED ORDER — NIRMATRELVIR/RITONAVIR (PAXLOVID)TABLET: 3.0000 | ORAL_TABLET | Freq: Two times a day (BID) | ORAL | 0 refills | Status: AC

## 2022-11-13 MED ORDER — MECLIZINE HCL 12.5 MG PO TABS: 12.5000 mg | ORAL_TABLET | Freq: Three times a day (TID) | ORAL | 1 refills | Status: DC | PRN

## 2022-11-13 MED ORDER — HYDROCODONE BIT-HOMATROP MBR 5-1.5 MG/5ML PO SOLN: 5.0000 mL | Freq: Four times a day (QID) | ORAL | 0 refills | Status: AC | PRN

## 2022-11-13 NOTE — Progress Notes (Unsigned)
Patient ID: Anthony [name], male   DOB: 01-13-1966, 57 y.o.   MRN: [id number]        Chief Complaint: follow up URI symptoms found COVID + today, vertigo, asthma, anxiety       HPI:  Anthony [name] is a 57 y.o. male  Here with 5 days acute onset fever, facial pain, pressure, headache, general weakness and malaise, and greenish d/c, with mild ST and cough, but pt denies chest pain, wheezing, increased sob or doe, orthopnea, PND, increased LE swelling, palpitations, or syncope, but has had dizziness vertigo with lying down.  .Girlfriend with similar symptoms onset just prior to his.   Pt denies polydipsia, polyuria, or new focal neuro s/s.   Denies worsening depressive symptoms, suicidal ideation, or panic; has ongoing anxiety       Wt Readings from Last 3 Encounters:  11/13/22 206 lb (93.4 kg)  10/07/22 208 lb (94.3 kg)  09/25/22 210 lb (95.3 kg)   BP Readings from Last 3 Encounters:  11/13/22 120/78  10/07/22 115/77  09/25/22 120/84         Past Medical History:  Diagnosis Date   Allergy    Past hx    Anemia    Anxiety    Asthma    Cataract    removed left- forming on the right  DVT and PE from long travel trip    Chronic kidney disease 2006   kidney stones    Clotting disorder (HCC) 2016   PE - small    Complication of anesthesia    HARD TO WAKE UP AFTER 2ND EYE SURGERY   COVID-19 virus infection    Depression    DVT (deep venous thrombosis) (HCC)    DUE TO A LONG TRAVEL   Fatty liver    Genital herpes 05/06/2018   H/O renal calculi 2006   Headache    MIGRAINES   History of kidney stones    H/O   History of methicillin resistant staphylococcus aureus (MRSA)    Hyperlipidemia    Internal hemorrhoid    PE (pulmonary embolism)    Peptic ulcer disease    Pneumonia    h/o   PTSD (post-traumatic stress disorder)    Sleep apnea    DOES NOT USE CPAP CURRENTLY-[facility] DO[facility] NOT [facility]   Spondylosis    Tubular adenoma of  colon 2011   Dr Juanda Chance   Past Surgical History:  Procedure Laterality Date   CATARACT EXTRACTION Left 1996   Dr Chales Abrahams; OS   COLONOSCOPY     colonoscopy with polypectomy  2011   Dr Juanda Chance, hemorrhoids   CYSTOSCOPY W/ STONE MANIPULATION  2006   EYE SURGERY Left    INGUINAL HERNIA REPAIR Right 1990   POLYPECTOMY     SHOULDER SURGERY Right 12/17/2010   Dr Madelon Lips ; shoulder impingement & torn tendon   TRANSURETHRAL RESECTION OF PROSTATE N/A 02/23/2020   Procedure: TRANSURETHRAL RESECTION OF THE PROSTATE (TURP);  Surgeon: Sondra Come, MD;  Location: [facility] ORS;  Service: Urology;  Laterality: N/A;    reports that he has never smoked. He has never used smokeless tobacco. He reports current alcohol use. He reports that he does not use drugs. family history includes Cancer in his paternal grandmother; Dementia in his father and maternal aunt; Diabetes in his father; Esophageal cancer in his maternal uncle; Glaucoma in his father; Heart attack in his maternal uncle; Heart attack (  age of onset: 58) in his mother; Heart attack (age of onset: 57) in his maternal grandmother; Stroke (age of onset: 63) in his father. Allergies  Allergen Reactions   Aleve [Naproxen] Rash    Patient stated it was years ago   Nsaids Other (See Comments)    Bleeding ulcers   Current Outpatient Medications on File Prior to Visit  Medication Sig Dispense Refill   acetaminophen (TYLENOL) 500 MG tablet Take 1,000 mg by mouth every 6 (six) hours as needed for moderate pain.     albuterol (PROVENTIL HFA;VENTOLIN HFA) 108 (90 Base) MCG/ACT inhaler Inhale 2 puffs into the lungs every 6 (six) hours as needed. For shortness of breath 1 Inhaler 1   aspirin EC 81 MG tablet Take 81 mg by mouth daily.     atorvastatin (LIPITOR) 40 MG tablet Take 1 tablet (40 mg total) by mouth daily. 90 tablet 3   Brinzolamide-Brimonidine 1-0.2 % SUSP Place 1 drop into the left eye 2 (two) times daily.      budesonide-formoterol (SYMBICORT)  160-4.5 MCG/ACT inhaler Inhale 2 puffs then rinse mouth, twice every day 1 each 12   busPIRone (BUSPAR) 10 MG tablet      busPIRone (BUSPAR) 15 MG tablet TAKE ONE TABLET BY MOUTH TWICE A DAY FOR ANXIETY FOR ANXIETY     carboxymethylcellulose (REFRESH PLUS) 0.5 % SOLN Apply to eye.     cetirizine (ZYRTEC) 10 MG tablet Take 1 tablet by mouth daily as needed.     Cholecalciferol (VITAMIN D3 PO) Take 1 tablet by mouth daily.     clotrimazole-betamethasone (LOTRISONE) cream Use as directed twice per day as needed to the affected area 45 g 1   dextromethorphan-guaiFENesin (ROBITUSSIN-DM) 10-100 MG/5ML liquid      diclofenac Sodium (VOLTAREN) 1 % GEL Apply topically.     fluticasone (FLONASE) 50 MCG/ACT nasal spray Place into the nose.     ketoconazole (NIZORAL) 2 % cream Apply topically.     ketotifen (ZADITOR) 0.025 % ophthalmic solution Apply to eye.     meloxicam (MOBIC) 15 MG tablet Take 1 tablet (15 mg total) by mouth daily as needed for pain. 90 tablet 1   methocarbamol (ROBAXIN) 500 MG tablet Take 1 tablet (500 mg total) by mouth 4 (four) times daily. 60 tablet 2   methylPREDNISolone (MEDROL DOSEPAK) 4 MG TBPK tablet Take as directed 21 each 0   Multiple Vitamin (MULTIVITAMIN) tablet Take 1 tablet by mouth daily.     nortriptyline (PAMELOR) 25 MG capsule Take 1 capsule (25 mg total) by mouth at bedtime. 30 capsule 5   ondansetron (ZOFRAN-ODT) 8 MG disintegrating tablet Take 1 tablet by mouth every 12 (twelve) hours.     OXcarbazepine (TRILEPTAL) 150 MG tablet Take 150 mg by mouth as needed (FOR ANXIETY/PTSD).     PARoxetine (PAXIL) 40 MG tablet TAKE ONE-HALF TABLET BY MOUTH DAILY FOR MENTAL HEALTH     phenazopyridine (PYRIDIUM) 200 MG tablet Take 200 mg by mouth 3 (three) times daily as needed.     pregabalin (LYRICA) 25 MG capsule Take by mouth.     riboflavin (VITAMIN B-2) 100 MG TABS tablet TAKE FOUR TABLETS BY MOUTH DAILY FOR MIGRAINE PREVENTION     sertraline (ZOLOFT) 100 MG tablet TAKE  ONE-HALF TABLET BY MOUTH IN THE MORNING FOR PTSD/MOOD FOR MENTAL HEALTH START IN 8 DAYS, AFTER DISCONTINUING PAROXETINE     sildenafil (VIAGRA) 100 MG tablet TAKE ONE TABLET BY MOUTH AS INSTRUCTED AS NEEDED ERECTILE DYSFUNCTION (  TAKE 1 HOUR PRIOR TO SEXUAL ACTIVITY *DO NOT EXCEED 1 DOSE PER 24 HOUR PERIOD*)     tadalafil (CIALIS) 20 MG tablet Take 0.5-1 tablets (10-20 mg total) by mouth daily as needed for erectile dysfunction (take 45 minutes prior to sexual activity). 30 tablet 6   tiZANidine (ZANAFLEX) 4 MG tablet TAKE ONE TABLET BY MOUTH AT BEDTIME FOR MUSCLE PAIN, SPASM; MAXIMUM 1 PILL A DAY; NO DRIVING FOR 8 HOURS     traMADol (ULTRAM) 50 MG tablet Take 1 tablet by mouth every 8 (eight) hours as needed.     triamcinolone cream (KENALOG) 0.1 % Apply topically.     Ubrogepant 100 MG TABS TAKE ONE TABLET BY MOUTH DAILY AS NEEDED - NEW MEDICINE FOR MIGRAINE RELIEF     valACYclovir (VALTREX) 1000 MG tablet Take 1/2 tablet by mouth twice daily for 3 days or 1 tablet by mouth every day for 5 days at onset of breakout. 90 tablet 1   valACYclovir (VALTREX) 500 MG tablet TAKE ONE TABLET BY MOUTH TWICE A DAY FOR 3 DAYS WITH HERPES BREAKOUTS     [DISCONTINUED] omeprazole (PRILOSEC) 20 MG capsule Take 1 capsule (20 mg total) by mouth daily. 30 capsule 0   [DISCONTINUED] topiramate (TOPAMAX) 25 MG tablet topiramate 25 mg tablet (Patient not taking: No sig reported)     No current facility-administered medications on file prior to visit.        ROS:  All others reviewed and negative.  Objective        PE:  BP 120/78 (BP Location: Right Arm, Patient Position: Sitting, Cuff Size: Large)   Pulse 74   Temp 98.3 F (36.8 C) (Oral)   Ht 5\' 6"  (1.676 m)   Wt 206 lb (93.4 kg)   SpO2 97%   BMI 33.25 kg/m                 Constitutional: Pt appears in NAD               HENT: Head: NCAT.                Right Ear: External ear normal.                 Left Ear: External ear normal.                Eyes: .  Pupils are equal, round, and reactive to light. Conjunctivae and EOM are normal               Nose: without d/c or deformity               Neck: Neck supple. Gross normal ROM               Cardiovascular: Normal rate and regular rhythm.                 Pulmonary/Chest: Effort normal and breath sounds without rales or wheezing.                Abd:  Soft, NT, ND, + BS, no organomegaly               Neurological: Pt is alert. At baseline orientation, motor grossly intact               Skin: Skin is warm. No rashes, no other new lesions, LE edema - none               Psychiatric: Pt behavior  is normal without agitation   Micro: none  Cardiac tracings I have personally interpreted today:  none  Pertinent Radiological findings (summarize): none   Lab Results  Component Value Date   WBC 4.0 08/19/2022   HGB 15.1 08/19/2022   HCT 44.6 08/19/2022   PLT 230.0 08/19/2022   GLUCOSE 104 (H) 08/19/2022   CHOL 170 09/07/2022   TRIG 116.0 09/07/2022   HDL 49.10 09/07/2022   LDLDIRECT 89.0 04/20/2019   LDLCALC 98 09/07/2022   ALT 17 09/07/2022   AST 17 09/07/2022   NA 138 08/19/2022   K 4.1 08/19/2022   CL 106 08/19/2022   CREATININE 0.97 08/19/2022   BUN 13 08/19/2022   CO2 23 08/19/2022   TSH 1.48 08/19/2022   PSA 0.53 08/19/2022   HGBA1C 6.1 08/19/2022   POCT - COVID - positive  Assessment/Plan:  Anthony [name] is a 57 y.o. Black or African American [2] male with  has a past medical history of Allergy, Anemia, Anxiety, Asthma, Cataract, Chronic kidney disease (2006), Clotting disorder (HCC) (2016), Complication of anesthesia, COVID-19 virus infection, Depression, DVT (deep venous thrombosis) (HCC), Fatty liver, Genital herpes (05/06/2018), H/O renal calculi (2006), Headache, History of kidney stones, History of methicillin resistant staphylococcus aureus (MRSA), Hyperlipidemia, Internal hemorrhoid, PE (pulmonary embolism), Peptic ulcer disease, Pneumonia, PTSD (post-traumatic stress  disorder), Sleep apnea, Spondylosis, and Tubular adenoma of colon (2011).  COVID-19 virus infection Mild to mod, for antibx course paxlovid, cough med prn,  to f/u any worsening symptoms or concerns   Vertigo Ok for meclizine prn,  to f/u any worsening symptoms or concerns   Other asthma Stable, exam benign, ok for inhaler prn  Anxiety Mild to mod chronic persistent, declines change in tx for now  Followup: Return if symptoms worsen or fail to improve.  Oliver Barre, MD 11/15/2022 7:42 PM [facility] - [facility]

## 2022-11-13 NOTE — Patient Instructions (Addendum)
Your COVID testing was done today= Positive  Please take all new medication as prescribed - the antibiotic paxlovid, cough medicine, and meclizine as needed for the dizziness part  Please continue all other medications as before, and refills have been done if requested.  Please have the pharmacy call with any other refills you may need.  Please keep your appointments with your specialists as you may have planned  You may wish for your girlfriend to be tested as well

## 2022-11-15 ENCOUNTER — Encounter: Payer: Self-pay | Admitting: Internal Medicine

## 2022-11-15 DIAGNOSIS — R42 Dizziness and giddiness: Secondary | ICD-10-CM | POA: Insufficient documentation

## 2022-11-15 DIAGNOSIS — U071 COVID-19: Secondary | ICD-10-CM | POA: Insufficient documentation

## 2022-11-15 NOTE — Assessment & Plan Note (Signed)
Stable, exam benign, ok for inhaler prn

## 2022-11-15 NOTE — Assessment & Plan Note (Signed)
Mild to mod chronic persistent, declines change in tx for now

## 2022-11-15 NOTE — Assessment & Plan Note (Signed)
Ok for meclizine prn,  to f/u any worsening symptoms or concerns 

## 2022-11-15 NOTE — Assessment & Plan Note (Signed)
Mild to mod, for antibx course  - paxlovid, cough med prn,  to f/u any worsening symptoms or concerns °

## 2022-11-27 ENCOUNTER — Encounter: Payer: Self-pay | Admitting: Internal Medicine

## 2022-12-21 ENCOUNTER — Encounter (HOSPITAL_BASED_OUTPATIENT_CLINIC_OR_DEPARTMENT_OTHER): Payer: Self-pay | Admitting: Student

## 2022-12-21 ENCOUNTER — Ambulatory Visit (INDEPENDENT_AMBULATORY_CARE_PROVIDER_SITE_OTHER): Payer: Medicare Other

## 2022-12-21 ENCOUNTER — Ambulatory Visit (INDEPENDENT_AMBULATORY_CARE_PROVIDER_SITE_OTHER): Payer: Medicare Other | Admitting: Student

## 2022-12-21 ENCOUNTER — Other Ambulatory Visit (HOSPITAL_BASED_OUTPATIENT_CLINIC_OR_DEPARTMENT_OTHER): Payer: Self-pay

## 2022-12-21 DIAGNOSIS — M25879 Other specified joint disorders, unspecified ankle and foot: Secondary | ICD-10-CM | POA: Diagnosis not present

## 2022-12-21 DIAGNOSIS — M25571 Pain in right ankle and joints of right foot: Secondary | ICD-10-CM | POA: Diagnosis not present

## 2022-12-21 MED ORDER — MELOXICAM 15 MG PO TABS
15.0000 mg | ORAL_TABLET | Freq: Every day | ORAL | 0 refills | Status: AC
  Filled 2022-12-21: qty 7, 7d supply, fill #0

## 2022-12-21 NOTE — Progress Notes (Signed)
Chief Complaint: Right ankle pain     History of Present Illness:    Anthony [name] is a 57 y.o. male presenting today with atraumatic right ankle pain.  This began about 3 weeks ago.  He states that he began noticing the pain shortly after running out to his car.  Pain is located mainly on the medial ankle and is moderate in severity.  He has been using an ankle brace as well as taking Tylenol for pain.  Denies any pain radiating down into the foot.  Reports history of bilateral flatfeet as well as "weak ankles".  Does also report one occurrence of stomach ulcer due to NSAIDs, so he tries to avoid these when possible.   Surgical History:   None  PMH/PSH/Family History/Social History/Meds/Allergies:    Past Medical History:  Diagnosis Date   Allergy    Past hx    Anemia    Anxiety    Asthma    Cataract    removed left- forming on the right  DVT and PE from long travel trip    Chronic kidney disease 2006   kidney stones    Clotting disorder (HCC) 2016   PE - small    Complication of anesthesia    HARD TO WAKE UP AFTER 2ND EYE SURGERY   COVID-19 virus infection    Depression    DVT (deep venous thrombosis) (HCC)    DUE TO A LONG TRAVEL   Fatty liver    Genital herpes 05/06/2018   H/O renal calculi 2006   Headache    MIGRAINES   History of kidney stones    H/O   History of methicillin resistant staphylococcus aureus (MRSA)    Hyperlipidemia    Internal hemorrhoid    PE (pulmonary embolism)    Peptic ulcer disease    Pneumonia    h/o   PTSD (post-traumatic stress disorder)    Sleep apnea    DOES NOT USE CPAP CURRENTLY-[facility] NOT [facility]20)   Spondylosis    Tubular adenoma of colon 2011   Dr Juanda Chance   Past Surgical History:  Procedure Laterality Date   CATARACT EXTRACTION Left 1996   Dr Chales Abrahams; OS   COLONOSCOPY     colonoscopy with polypectomy  2011   Dr Juanda Chance, hemorrhoids    CYSTOSCOPY W/ STONE MANIPULATION  2006   EYE SURGERY Left    INGUINAL HERNIA REPAIR Right 1990   POLYPECTOMY     SHOULDER SURGERY Right 12/17/2010   Dr Madelon Lips ; shoulder impingement & torn tendon   TRANSURETHRAL RESECTION OF PROSTATE N/A 02/23/2020   Procedure: TRANSURETHRAL RESECTION OF THE PROSTATE (TURP);  Surgeon: Sondra Come, MD;  Location: [facility] ORS;  Service: Urology;  Laterality: N/A;   Social History   Socioeconomic History   Marital status: Divorced    Spouse name: Not on file   Number of children: 4   Years of education: 12   Highest education level: Not on file  Occupational History   Not on file  Tobacco Use   Smoking status: Never   Smokeless tobacco: Never  Vaping Use   Vaping status: Never Used  Substance and Sexual Activity   Alcohol use: Yes    Alcohol/week: 0.0 standard drinks  of alcohol    Comment: Socially , < 2X/ month   Drug use: No   Sexual activity: Yes    Birth control/protection: None    Comment: Last encounter: 10th of Jan. 2023, Wife.  Other Topics Concern   Not on file  Social History Narrative   Fun: [employer]   Right handed   Goes to the [place]    Drinks no caffeine   2 floor home   Daughter lives with him   Social Determinants of Health   Financial Resource Strain: Not on file  Food Insecurity: Not on file  Transportation Needs: Not on file  Physical Activity: Not on file  Stress: Not on file  Social Connections: Unknown (01/09/2022)   Received [facility]   Social Network    Social Network: Not on file   Family History  Problem Relation Age of Onset   Heart attack Mother 76   Dementia Father    Stroke Father 59   Diabetes Father        PVD   Glaucoma Father        blindness   Dementia Maternal Aunt    Heart attack Maternal Uncle         X2,pre 55   Esophageal cancer Maternal Uncle    Heart attack Maternal Grandmother 50   Cancer Paternal Grandmother        ? primary   Asthma Neg Hx    COPD Neg  Hx    Colon cancer Neg Hx    Colon polyps Neg Hx    Stomach cancer Neg Hx    Rectal cancer Neg Hx    Allergies  Allergen Reactions   Aleve [Naproxen] Rash    Patient stated it was years ago   Nsaids Other (See Comments)    Bleeding ulcers   Current Outpatient Medications  Medication Sig Dispense Refill   meloxicam (MOBIC) 15 MG tablet Take 1 tablet (15 mg total) by mouth daily for 7 days. 7 tablet 0   acetaminophen (TYLENOL) 500 MG tablet Take 1,000 mg by mouth every 6 (six) hours as needed for moderate pain.     albuterol (PROVENTIL HFA;VENTOLIN HFA) 108 (90 Base) MCG/ACT inhaler Inhale 2 puffs into the lungs every 6 (six) hours as needed. For shortness of breath 1 Inhaler 1   aspirin EC 81 MG tablet Take 81 mg by mouth daily.     atorvastatin (LIPITOR) 40 MG tablet Take 1 tablet (40 mg total) by mouth daily. 90 tablet 3   Brinzolamide-Brimonidine 1-0.2 % SUSP Place 1 drop into the left eye 2 (two) times daily.      budesonide-formoterol (SYMBICORT) 160-4.5 MCG/ACT inhaler Inhale 2 puffs then rinse mouth, twice every day 1 each 12   busPIRone (BUSPAR) 10 MG tablet      busPIRone (BUSPAR) 15 MG tablet TAKE ONE TABLET BY MOUTH TWICE A DAY FOR ANXIETY FOR ANXIETY     carboxymethylcellulose (REFRESH PLUS) 0.5 % SOLN Apply to eye.     cetirizine (ZYRTEC) 10 MG tablet Take 1 tablet by mouth daily as needed.     Cholecalciferol (VITAMIN D3 PO) Take 1 tablet by mouth daily.     clotrimazole-betamethasone (LOTRISONE) cream Use as directed twice per day as needed to the affected area 45 g 1   dextromethorphan-guaiFENesin (ROBITUSSIN-DM) 10-100 MG/5ML liquid      diclofenac Sodium (VOLTAREN) 1 % GEL Apply topically.     fluticasone (FLONASE) 50 MCG/ACT nasal spray Place  into the nose.     ketoconazole (NIZORAL) 2 % cream Apply topically.     ketotifen (ZADITOR) 0.025 % ophthalmic solution Apply to eye.     meclizine (ANTIVERT) 12.5 MG tablet Take 1 tablet (12.5 mg total) by mouth 3 (three)  times daily as needed for dizziness. 30 tablet 1   methocarbamol (ROBAXIN) 500 MG tablet Take 1 tablet (500 mg total) by mouth 4 (four) times daily. 60 tablet 2   methylPREDNISolone (MEDROL DOSEPAK) 4 MG TBPK tablet Take as directed 21 each 0   Multiple Vitamin (MULTIVITAMIN) tablet Take 1 tablet by mouth daily.     nortriptyline (PAMELOR) 25 MG capsule Take 1 capsule (25 mg total) by mouth at bedtime. 30 capsule 5   ondansetron (ZOFRAN-ODT) 8 MG disintegrating tablet Take 1 tablet by mouth every 12 (twelve) hours.     OXcarbazepine (TRILEPTAL) 150 MG tablet Take 150 mg by mouth as needed (FOR ANXIETY/PTSD).     PARoxetine (PAXIL) 40 MG tablet TAKE ONE-HALF TABLET BY MOUTH DAILY FOR MENTAL HEALTH     phenazopyridine (PYRIDIUM) 200 MG tablet Take 200 mg by mouth 3 (three) times daily as needed.     pregabalin (LYRICA) 25 MG capsule Take by mouth.     riboflavin (VITAMIN B-2) 100 MG TABS tablet TAKE FOUR TABLETS BY MOUTH DAILY FOR MIGRAINE PREVENTION     sertraline (ZOLOFT) 100 MG tablet TAKE ONE-HALF TABLET BY MOUTH IN THE MORNING FOR PTSD/MOOD FOR MENTAL HEALTH START IN 8 DAYS, AFTER DISCONTINUING PAROXETINE     sildenafil (VIAGRA) 100 MG tablet TAKE ONE TABLET BY MOUTH AS INSTRUCTED AS NEEDED ERECTILE DYSFUNCTION (TAKE 1 HOUR PRIOR TO SEXUAL ACTIVITY *DO NOT EXCEED 1 DOSE PER 24 HOUR PERIOD*)     tadalafil (CIALIS) 20 MG tablet Take 0.5-1 tablets (10-20 mg total) by mouth daily as needed for erectile dysfunction (take 45 minutes prior to sexual activity). 30 tablet 6   tiZANidine (ZANAFLEX) 4 MG tablet TAKE ONE TABLET BY MOUTH AT BEDTIME FOR MUSCLE PAIN, SPASM; MAXIMUM 1 PILL A DAY; NO DRIVING FOR 8 HOURS     traMADol (ULTRAM) 50 MG tablet Take 1 tablet by mouth every 8 (eight) hours as needed.     triamcinolone cream (KENALOG) 0.1 % Apply topically.     Ubrogepant 100 MG TABS TAKE ONE TABLET BY MOUTH DAILY AS NEEDED - NEW MEDICINE FOR MIGRAINE RELIEF     valACYclovir (VALTREX) 1000 MG tablet Take  1/2 tablet by mouth twice daily for 3 days or 1 tablet by mouth every day for 5 days at onset of breakout. 90 tablet 1   valACYclovir (VALTREX) 500 MG tablet TAKE ONE TABLET BY MOUTH TWICE A DAY FOR 3 DAYS WITH HERPES BREAKOUTS     No current facility-administered medications for this visit.   No results found.  Review of Systems:   A ROS was performed including pertinent positives and negatives as documented in the HPI.  Physical Exam :   Constitutional: NAD and appears stated age Neurological: Alert and oriented Psych: Appropriate affect and cooperative There were no vitals taken for this visit.   Comprehensive Musculoskeletal Exam:    Notable flatfoot deformity bilaterally.  Tenderness palpation of the medial right ankle of the PT tendon insertion as well as the anteromedial and anterolateral aspects of the ankle.  Active ankle range of motion from 20 degrees dorsiflexion to 30 degrees plantarflexion.  No pain with forced dorsiflexion.  Negative anterior drawer.  DP pulse 2+.  Neurosensory  exam intact.  Imaging:   Xray (right ankle 3 views): Dorsal talar osteophytes.  Otherwise negative.    I personally reviewed and interpreted the radiographs.   Assessment:   56 y.o. male with atraumatic right ankle pain.  He does have spurring of the dorsal talus on today's radiographs suggesting possible anterior ankle impingement.  Does not have notable pain with dorsiflexion, however he does have some pain to palpation in the anterior ankle consistent with this diagnosis.  He also does have discomfort around the insertion of the posterior tibialis tendon, which could also be contributing.  In discussion of treatment options, I do recommend proceeding first with conservative therapies including supportive footwear and arch support as well as anti-inflammatories.  I will only put him on a short course of meloxicam due to previous ulcer.  Will also consider physical therapy and cortisone injections  should symptoms continues to suggest some degree of impingement.  Plan :    -Return to clinic as needed     I personally saw and evaluated the patient, and participated in the management and treatment plan.  Hazle Nordmann, PA-C Orthopedics  This document was dictated using Conservation officer, historic buildings. A reasonable attempt at proof reading has been made to minimize errors.

## 2023-01-15 ENCOUNTER — Encounter: Payer: Self-pay | Admitting: Internal Medicine

## 2023-01-15 MED ORDER — CYCLOBENZAPRINE HCL 5 MG PO TABS: 5.0000 mg | ORAL_TABLET | Freq: Three times a day (TID) | ORAL | 1 refills | Status: DC | PRN

## 2023-02-02 ENCOUNTER — Ambulatory Visit (INDEPENDENT_AMBULATORY_CARE_PROVIDER_SITE_OTHER): Payer: Federal, State, Local not specified - PPO

## 2023-02-02 ENCOUNTER — Ambulatory Visit
Admission: EM | Admit: 2023-02-02 | Discharge: 2023-02-02 | Disposition: A | Discharge status: Home / Self Care | Payer: Federal, State, Local not specified - PPO | Attending: Emergency Medicine | Admitting: Emergency Medicine

## 2023-02-02 DIAGNOSIS — M2011 Hallux valgus (acquired), right foot: Secondary | ICD-10-CM

## 2023-02-02 DIAGNOSIS — R202 Paresthesia of skin: Secondary | ICD-10-CM | POA: Diagnosis not present

## 2023-02-02 NOTE — ED Triage Notes (Addendum)
Patient states that his right great toe is hurting. Doesn't know if he stepped on something. Toe feels numb

## 2023-02-02 NOTE — Discharge Instructions (Addendum)
Your x-rays demonstrated that you have a deformity to the joint of your right big toe where your toe meets your foot as well as some degenerative changes.  This may be causing the pain you are experiencing and the numbness in your toe.  I have referred you to podiatry for further evaluation and management.  They will call you to make an appointment.

## 2023-02-02 NOTE — ED Provider Notes (Signed)
[facility]    CSN: [id number] Arrival date & time: 02/02/23  1823      History   Chief Complaint Chief Complaint  Patient presents with   Toe Injury    HPI Anthony [name] is a 57 y.o. male.   HPI  57 year old male with a past medical history significant for asthma, depression, MRSA, clotting disorder, PE, DVT, renal calculi, anxiety, headache, CKD, and PTSD presents for evaluation of numbness to the pad of his right big toe that began yesterday.  He states that he was walking across the floor and when he transitioned from the hardwood floor to the carpet he felt like something poked him in the bottom of his foot.  He states he feels this frequently when he makes this transition but he is never found anything on the floor.  He reports that this time he developed numbness to the pad of his left great toe where he is not developed that in the past.  No other toes or areas of his foot are affected.  He does endorse feeling some pain with weightbearing to the ball of his foot just behind the MTP joint of the great toe.  Past Medical History:  Diagnosis Date   Allergy    Past hx    Anemia    Anxiety    Asthma    Cataract    removed left- forming on the right  DVT and PE from long travel trip    Chronic kidney disease 2006   kidney stones    Clotting disorder (HCC) 2016   PE - small    Complication of anesthesia    HARD TO WAKE UP AFTER 2ND EYE SURGERY   COVID-19 virus infection    Depression    DVT (deep venous thrombosis) (HCC)    DUE TO A LONG TRAVEL   Fatty liver    Genital herpes 05/06/2018   H/O renal calculi 2006   Headache    MIGRAINES   History of kidney stones    H/O   History of methicillin resistant staphylococcus aureus (MRSA)    Hyperlipidemia    Internal hemorrhoid    PE (pulmonary embolism)    Peptic ulcer disease    Pneumonia    h/o   PTSD (post-traumatic stress disorder)    Sleep apnea    DOES NOT USE CPAP CURRENTLY-MASK DOES NOT FIT  CORRECTLY [facility]13[facility]20)   Spondylosis    Tubular adenoma of colon 2011   Dr Juanda Chance    Patient Active Problem List   Diagnosis Date Noted   COVID-19 virus infection 11/15/2022   Vertigo 11/15/2022   Artificial lens present 08/18/2022   Age-related cataract of right eye 08/18/2022   Flat foot (pes planus) (acquired), unspecified foot 08/18/2022   Hypertrophic disorder of the skin, unspecified 08/18/2022   Major depressive disorder, recurrent, moderate (HCC) 08/18/2022   Ocular hypertension 08/18/2022   Erectile dysfunction 08/18/2022   Presbyopia 02/23/2022   Cervical radiculopathy 02/10/2022   Bilateral posterior neck pain 01/17/2022   Tachycardia 10/11/2021   Dental caries on smooth surface penetrating into dentin 10/09/2021   Tachycardia, unspecified 10/09/2021   Chronic chest wall pain 06/26/2021   Ocular hypertension, 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   Unspecified disorder of refraction 05/09/2021   Bilateral impacted cerumen 04/26/2021   Cellulitis of left elbow 11/19/2020   Allergic rhinitis 10/15/2020   Brow ptosis, left 10/15/2020   Bunion of right foot 10/15/2020  Contact with and (suspected) exposure to covid-19 10/15/2020   Deposits (accretions) on teeth 10/15/2020   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, left eye, stage unspecified 10/15/2020   Hematuria, unspecified 10/15/2020   Inflammatory disease of prostate, unspecified 10/15/2020   Male erectile disorder (CODE) 10/15/2020   Male erectile dysfunction, unspecified 10/15/2020   Nondependent alcohol abuse, continuous drinking behavior 10/15/2020   Obesity 10/15/2020   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   Unspecified ptosis of left eyelid 10/15/2020   Unspecified symptoms and signs involving cognitive functions and awareness 10/15/2020   Other recurrent depressive disorders (HCC) 10/15/2020   Post-traumatic stress disorder, unspecified 10/15/2020   Adjustment disorder, unspecified 10/15/2020    Encounter for dental examination and cleaning without abnormal findings 10/15/2020   Recurrent major depression (HCC) 10/15/2020   Major depressive disorder, recurrent, unspecified (HCC) 10/15/2020   Mood disorder (HCC) 10/15/2020   Dietary counseling and surveillance 10/15/2020   Other specified counseling 10/15/2020   Headache, unspecified 10/15/2020   Herpes simplex 10/15/2020   Migraine with aura, intractable, with status migrainosus 10/15/2020   Migraine without aura, not intractable, with status migrainosus 10/15/2020   Low back pain, unspecified 10/15/2020   Pain in left leg 10/15/2020   Other asthma 10/15/2020   Meralgia paresthetica of left side 07/03/2020   Major depressive disorder, single episode, unspecified 06/25/2020   Leg cramping 09/04/2019   Pain in joint of right elbow 08/03/2019   Lateral epicondylitis of right elbow 08/03/2019   Nephrolithiasis 05/11/2019   Epigastric pain 05/11/2019   Other constipation 05/11/2019   Right sided abdominal pain 05/11/2019   Paresthesia 04/20/2019   Chronic post-traumatic stress disorder 01/14/2019   Left ear hearing loss 01/14/2019   Low back pain 01/14/2019   Intractable migraine with aura without status migrainosus 01/07/2019   Genital herpes 05/06/2018   STD exposure 05/06/2018   Obstructive sleep apnea 02/13/2018   Hyperglycemia 01/24/2018   Pelvic pain 07/10/2017   Exposure to blood or body fluid 07/07/2017   Trigger thumb of right hand 05/21/2017   Trigger finger, right middle finger 03/18/2017   Anxiety 03/18/2017   Chronic tension-type headache, not intractable 02/12/2017   Dysuria 12/02/2016   Pain of right thumb 11/13/2016   Other chest pain 07/27/2016   Onychomycosis 06/12/2016   Encounter for well adult exam with abnormal findings 06/12/2016   Generalized headache 05/15/2016   Lumbar paraspinal muscle spasm 05/15/2016   History of DVT (deep vein thrombosis) 11/18/2015   Elevated PSA 11/18/2015   Acute  pulmonary embolism (HCC) 2016 05/09/2015   Peptic ulcer 05/09/2015   Dyspnea 05/02/2015   DVT (deep venous thrombosis) (HCC) 04/26/2015   Duodenal ulcer 04/25/2015   Positive D dimer 04/23/2015   Internal hemorrhoid, bleeding 04/04/2015   Acute gastrointestinal hemorrhage 04/03/2015   Duodenitis 04/03/2015   Postural dizziness with presyncope 04/03/2015   Microscopic hematuria 04/03/2015   Dislocated IOL (intraocular lens), initial encounter 12/10/2014   MRSA infection 09/14/2013   History of methicillin resistant Staphylococcus aureus infection 02/17/2013   Spondylosis of lumbar joint 09/28/2012   Fatty liver disease, nonalcoholic 09/28/2012   Lumbosacral spondylosis without myelopathy 09/28/2012   Fatty metamorphosis of liver 09/28/2012   Atopic dermatitis, unspecified 05/13/2012   Seasonal and perennial allergic rhinitis 10/01/2011   Family history of ischemic heart disease 04/02/2011   Low serum testosterone level 03/12/2011   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/12/2011   History of colonic polyps 06/21/2009   Hyperlipidemia 02/16/2008   Asthma, mild persistent 02/16/2008   NONSPECIFIC ABNORMAL ELECTROCARDIOGRAM 02/16/2008  NEPHROLITHIASIS, HX OF 02/16/2008    Past Surgical History:  Procedure Laterality Date   CATARACT EXTRACTION Left 1996   Dr Chales Abrahams; OS   COLONOSCOPY     colonoscopy with polypectomy  2011   Dr Juanda Chance, hemorrhoids   CYSTOSCOPY W/ STONE MANIPULATION  2006   EYE SURGERY Left    INGUINAL HERNIA REPAIR Right 1990   POLYPECTOMY     SHOULDER SURGERY Right 12/17/2010   Dr Madelon Lips ; shoulder impingement & torn tendon   TRANSURETHRAL RESECTION OF PROSTATE N/A 02/23/2020   Procedure: TRANSURETHRAL RESECTION OF THE PROSTATE (TURP);  Surgeon: Sondra Come, MD;  Location: [facility];  Service: Urology;  Laterality: N/A;       Home Medications    Prior to Admission medications   Medication Sig Start Date End Date Taking? Authorizing Provider  aspirin EC 81 MG tablet  Take 81 mg by mouth daily.   Yes [provider]  atorvastatin (LIPITOR) 40 MG tablet Take 1 tablet (40 mg total) by mouth daily. 10/21/22  Yes Corwin Levins, MD  acetaminophen (TYLENOL) 500 MG tablet Take 1,000 mg by mouth every 6 (six) hours as needed for moderate pain.    [provider]  albuterol (PROVENTIL HFA;VENTOLIN HFA) 108 (90 Base) MCG/ACT inhaler Inhale 2 puffs into the lungs every 6 (six) hours as needed. For shortness of breath 06/29/16   [place], Betty G, MD  Brinzolamide-Brimonidine 1-0.2 % SUSP Place 1 drop into the left eye 2 (two) times daily.     [provider]  budesonide-formoterol (SYMBICORT) 160-4.5 MCG/ACT inhaler Inhale 2 puffs then rinse mouth, twice every day 02/24/22   Waymon Budge, MD  busPIRone (BUSPAR) 10 MG tablet  08/08/21   [provider]  busPIRone (BUSPAR) 15 MG tablet TAKE ONE TABLET BY MOUTH TWICE A DAY FOR ANXIETY FOR ANXIETY 12/29/21   [provider]  carboxymethylcellulose (REFRESH PLUS) 0.5 % SOLN Apply to eye. 11/15/20   [provider]  cetirizine (ZYRTEC) 10 MG tablet Take 1 tablet by mouth daily as needed. 08/18/21   [provider]  Cholecalciferol (VITAMIN D3 PO) Take 1 tablet by mouth daily.    [provider]  clotrimazole-betamethasone (LOTRISONE) cream Use as directed twice per day as needed to the affected area 12/27/20   Corwin Levins, MD  cyclobenzaprine (FLEXERIL) 5 MG tablet Take 1 tablet (5 mg total) by mouth 3 (three) times daily as needed. 01/15/23   Corwin Levins, MD  dextromethorphan-guaiFENesin (ROBITUSSIN-DM) 10-100 MG/5ML liquid  10/06/21   [provider]  diclofenac Sodium (VOLTAREN) 1 % GEL Apply topically. 12/18/21   [provider]  fluticasone (FLONASE) 50 MCG/ACT nasal spray Place into the nose. 07/12/20   [provider]  ketoconazole (NIZORAL) 2 % cream Apply topically. 07/12/20   [provider]  ketotifen (ZADITOR) 0.025 %  ophthalmic solution Apply to eye. 11/15/20   [provider]  meclizine (ANTIVERT) 12.5 MG tablet Take 1 tablet (12.5 mg total) by mouth 3 (three) times daily as needed for dizziness. 11/13/22 11/13/23  Corwin Levins, MD  methocarbamol (ROBAXIN) 500 MG tablet Take 1 tablet (500 mg total) by mouth 4 (four) times daily. 01/14/22   Corwin Levins, MD  methylPREDNISolone (MEDROL DOSEPAK) 4 MG TBPK tablet Take as directed 11/26/21   Candelaria Stagers, DPM  Multiple Vitamin (MULTIVITAMIN) tablet Take 1 tablet by mouth daily.    [provider]  nortriptyline (PAMELOR) 25 MG capsule Take  1 capsule (25 mg total) by mouth at bedtime. 11/25/21   Everlena Cooper, Adam R, DO  ondansetron (ZOFRAN-ODT) 8 MG disintegrating tablet Take 1 tablet by mouth every 12 (twelve) hours.    [provider]  OXcarbazepine (TRILEPTAL) 150 MG tablet Take 150 mg by mouth as needed (FOR ANXIETY/PTSD).    [provider]  PARoxetine (PAXIL) 40 MG tablet TAKE ONE-HALF TABLET BY MOUTH DAILY FOR MENTAL HEALTH 01/01/20   [provider]  phenazopyridine (PYRIDIUM) 200 MG tablet Take 200 mg by mouth 3 (three) times daily as needed. 10/03/21   [provider]  pregabalin (LYRICA) 25 MG capsule Take by mouth. 02/19/22   [provider]  riboflavin (VITAMIN B-2) 100 MG TABS tablet TAKE FOUR TABLETS BY MOUTH DAILY FOR MIGRAINE PREVENTION 08/18/21   [provider]  sertraline (ZOLOFT) 100 MG tablet TAKE ONE-HALF TABLET BY MOUTH IN THE MORNING FOR PTSD/MOOD FOR MENTAL HEALTH START IN 8 DAYS, AFTER DISCONTINUING PAROXETINE 10/27/21   [provider]  sildenafil (VIAGRA) 100 MG tablet TAKE ONE TABLET BY MOUTH AS INSTRUCTED AS NEEDED ERECTILE DYSFUNCTION (TAKE 1 HOUR PRIOR TO SEXUAL ACTIVITY *DO NOT EXCEED 1 DOSE PER 24 HOUR PERIOD*) 08/29/21   [provider]  tadalafil (CIALIS) 20 MG tablet Take 0.5-1 tablets (10-20 mg total) by mouth daily as needed for erectile dysfunction (take  45 minutes prior to sexual activity). 09/17/22   Sondra Come, MD  traMADol (ULTRAM) 50 MG tablet Take 1 tablet by mouth every 8 (eight) hours as needed.    [provider]  triamcinolone cream (KENALOG) 0.1 % Apply topically. 09/06/20   [provider]  Ubrogepant 100 MG TABS TAKE ONE TABLET BY MOUTH DAILY AS NEEDED - NEW MEDICINE FOR MIGRAINE RELIEF 02/17/21   [provider]  valACYclovir (VALTREX) 1000 MG tablet Take 1/2 tablet by mouth twice daily for 3 days or 1 tablet by mouth every day for 5 days at onset of breakout. 04/24/21   Corwin Levins, MD  valACYclovir (VALTREX) 500 MG tablet TAKE ONE TABLET BY MOUTH TWICE A DAY FOR 3 DAYS WITH HERPES BREAKOUTS 12/18/21   [provider]  omeprazole (PRILOSEC) 20 MG capsule Take 1 capsule (20 mg total) by mouth daily. 04/03/20 04/13/20  Moshe Cipro, FNP  topiramate (TOPAMAX) 25 MG tablet topiramate 25 mg tablet Patient not taking: No sig reported  04/13/20  [provider]    Family History Family History  Problem Relation Age of Onset   Heart attack Mother 43   Dementia Father    Stroke Father 56   Diabetes Father        PVD   Glaucoma Father        blindness   Dementia Maternal Aunt    Heart attack Maternal Uncle         X2,pre 55   Esophageal cancer Maternal Uncle    Heart attack Maternal Grandmother 54   Cancer Paternal Grandmother        ? primary   Asthma Neg Hx    COPD Neg Hx    Colon cancer Neg Hx    Colon polyps Neg Hx    Stomach cancer Neg Hx    Rectal cancer Neg Hx     Social History Social History   Tobacco Use   Smoking status: Never   Smokeless tobacco: Never  Vaping Use   Vaping status: Never Used  Substance Use Topics   Alcohol use: Yes  Alcohol/week: 0.0 standard drinks of alcohol    Comment: Socially , < 2X/ month   Drug use: No     Allergies   Aleve [naproxen] and Nsaids   Review of Systems Review of Systems  Musculoskeletal:  Negative  for arthralgias and joint swelling.  Skin:  Negative for color change and wound.  Neurological:  Positive for numbness.     Physical Exam Triage Vital Signs ED Triage Vitals  Encounter Vitals Group     BP      Systolic BP Percentile      Diastolic BP Percentile      Pulse      Resp      Temp      Temp src      SpO2      Weight      Height      Head Circumference      Peak Flow      Pain Score      Pain Loc      Pain Education      Exclude from Growth Chart    No data found.  Updated Vital Signs BP 135/88 (BP Location: Left Arm)   Pulse 82   Temp 98.2 F (36.8 C) (Oral)   Resp 18   SpO2 97%   Visual Acuity Right Eye Distance:   Left Eye Distance:   Bilateral Distance:    Right Eye Near:   Left Eye Near:    Bilateral Near:     Physical Exam Vitals and nursing note reviewed.  Constitutional:      Appearance: Normal appearance. He is not ill-appearing.  HENT:     Head: Normocephalic and atraumatic.  Musculoskeletal:        General: Tenderness present. No swelling or signs of injury.  Skin:    General: Skin is warm and dry.     Capillary Refill: Capillary refill takes less than 2 seconds.     Findings: No bruising or erythema.  Neurological:     General: No focal deficit present.     Mental Status: He is alert and oriented to person, place, and time.      UC Treatments / Results  Labs (all labs ordered are listed, but only abnormal results are displayed) Labs Reviewed - No data to display  EKG   Radiology No results found.  Procedures Procedures (including critical care time)  Medications Ordered in UC Medications - No data to display  Initial Impression / Assessment and Plan / UC Course  I have reviewed the triage vital signs and the nursing notes.  Pertinent labs & imaging results that were available during my care of the patient were reviewed by me and considered in my medical decision making (see chart for details).   Patient is a  nontoxic-appearing 57 year old male presenting for evaluation of numbness to the pad of his right great toe that started yesterday.  He is also endorsing some pain proximal to the MTP joint of his great toe without any known injury.  On exam the patient's toe is in normal anatomical alignment and is free of edema, ecchymosis, or erythema.  No tenderness to palpation of the MTP joint, proximal phalanx, IP joint, or distal phalanx.  He does endorse some mild pain behind the MTP joint of the right great toe but I am unable to find it on palpation.  He does have a developing callus, which he reports has been there for a number of years.  Etiology of the patient's symptoms is unclear.  I will obtain a radiograph of the right great toe to evaluate for any bony abnormality.  Right great toe films independently reviewed and evaluated by me.  Impression: No evidence of fracture or dislocation.  Patient does have a hallux valgus deformity at the MTP joint of the right great toe.  There is some spurring present on the head of the first metatarsal.  Radiology read is pending.  I will discharge patient home with a diagnosis of hallux valgus of the MTP joint and paresthesias to the right great toe.  I will refer him to podiatry for further evaluation and management.   Final Clinical Impressions(s) / UC Diagnoses   Final diagnoses:  Hallux valgus (acquired), right foot  Paresthesia     Discharge Instructions      Your x-rays demonstrated that you have a deformity to the joint of your right big toe where your toe meets your foot as well as some degenerative changes.  This may be causing the pain you are experiencing and the numbness in your toe.  I have referred you to podiatry for further evaluation and management.  They will call you to make an appointment.     ED Prescriptions   None    PDMP not reviewed this encounter.   Becky Augusta, NP 02/02/23 1931

## 2023-02-11 ENCOUNTER — Encounter: Payer: Self-pay | Admitting: Podiatry

## 2023-02-11 ENCOUNTER — Ambulatory Visit: Payer: Medicare Other | Admitting: Podiatry

## 2023-02-11 VITALS — BP 132/80 | HR 88

## 2023-02-11 DIAGNOSIS — M2012 Hallux valgus (acquired), left foot: Secondary | ICD-10-CM

## 2023-02-11 DIAGNOSIS — M7751 Other enthesopathy of right foot: Secondary | ICD-10-CM | POA: Diagnosis not present

## 2023-02-11 NOTE — Progress Notes (Signed)
Subjective:  Patient ID: Anthony [name], male    DOB: 1965-07-23,  MRN: [id number]  Chief Complaint  Patient presents with   Numbness    "I have numbness under my right big toe."    57 y.o. male presents with the above complaint.  Patient presents with follow-up of right first MPJ pain.  Patient states started to act up again.  He states he is doing a lot better.  He denies any other acute complaints.  He would like to continue doing steroid injection for now.   Review of Systems: Negative except as noted in the HPI. Denies N/V/F/Ch.  Past Medical History:  Diagnosis Date   Allergy    Past hx    Anemia    Anxiety    Asthma    Cataract    removed left- forming on the right  DVT and PE from long travel trip    Chronic kidney disease 2006   kidney stones    Clotting disorder (HCC) 2016   PE - small    Complication of anesthesia    HARD TO WAKE UP AFTER 2ND EYE SURGERY   COVID-19 virus infection    Depression    DVT (deep venous thrombosis) (HCC)    DUE TO A LONG TRAVEL   Fatty liver    Genital herpes 05/06/2018   H/O renal calculi 2006   Headache    MIGRAINES   History of kidney stones    H/O   History of methicillin resistant staphylococcus aureus (MRSA)    Hyperlipidemia    Internal hemorrhoid    PE (pulmonary embolism)    Peptic ulcer disease    Pneumonia    h/o   PTSD (post-traumatic stress disorder)    Sleep apnea    DOES NOT USE CPAP CURRENTLY-[facility] DO[facility] NOT [facility]   Spondylosis    Tubular adenoma of colon 2011   Dr Juanda Chance    Current Outpatient Medications:    acetaminophen (TYLENOL) 500 MG tablet, Take 1,000 mg by mouth every 6 (six) hours as needed for moderate pain., Disp: , Rfl:    albuterol (PROVENTIL HFA;VENTOLIN HFA) 108 (90 Base) MCG/ACT inhaler, Inhale 2 puffs into the lungs every 6 (six) hours as needed. For shortness of breath, Disp: 1 Inhaler, Rfl: 1   aspirin EC 81 MG tablet, Take 81 mg by  mouth daily., Disp: , Rfl:    atorvastatin (LIPITOR) 40 MG tablet, Take 1 tablet (40 mg total) by mouth daily., Disp: 90 tablet, Rfl: 3   Brinzolamide-Brimonidine 1-0.2 % SUSP, Place 1 drop into the left eye 2 (two) times daily. , Disp: , Rfl:    budesonide-formoterol (SYMBICORT) 160-4.5 MCG/ACT inhaler, Inhale 2 puffs then rinse mouth, twice every day, Disp: 1 each, Rfl: 12   busPIRone (BUSPAR) 10 MG tablet, , Disp: , Rfl:    busPIRone (BUSPAR) 15 MG tablet, TAKE ONE TABLET BY MOUTH TWICE A DAY FOR ANXIETY FOR ANXIETY, Disp: , Rfl:    carboxymethylcellulose (REFRESH PLUS) 0.5 % SOLN, Apply to eye., Disp: , Rfl:    cetirizine (ZYRTEC) 10 MG tablet, Take 1 tablet by mouth daily as needed., Disp: , Rfl:    Cholecalciferol (VITAMIN D3 PO), Take 1 tablet by mouth daily., Disp: , Rfl:    clotrimazole-betamethasone (LOTRISONE) cream, Use as directed twice per day as needed to the affected area, Disp: 45 g, Rfl: 1   cyclobenzaprine (FLEXERIL) 5 MG tablet, Take  1 tablet (5 mg total) by mouth 3 (three) times daily as needed., Disp: 40 tablet, Rfl: 1   dextromethorphan-guaiFENesin (ROBITUSSIN-DM) 10-100 MG/5ML liquid, , Disp: , Rfl:    diclofenac Sodium (VOLTAREN) 1 % GEL, Apply topically., Disp: , Rfl:    fluticasone (FLONASE) 50 MCG/ACT nasal spray, Place into the nose., Disp: , Rfl:    ketoconazole (NIZORAL) 2 % cream, Apply topically., Disp: , Rfl:    ketotifen (ZADITOR) 0.025 % ophthalmic solution, Apply to eye., Disp: , Rfl:    meclizine (ANTIVERT) 12.5 MG tablet, Take 1 tablet (12.5 mg total) by mouth 3 (three) times daily as needed for dizziness., Disp: 30 tablet, Rfl: 1   methocarbamol (ROBAXIN) 500 MG tablet, Take 1 tablet (500 mg total) by mouth 4 (four) times daily., Disp: 60 tablet, Rfl: 2   Multiple Vitamin (MULTIVITAMIN) tablet, Take 1 tablet by mouth daily., Disp: , Rfl:    OXcarbazepine (TRILEPTAL) 150 MG tablet, Take 150 mg by mouth as needed (FOR ANXIETY/PTSD)., Disp: , Rfl:     PARoxetine (PAXIL) 40 MG tablet, TAKE ONE-HALF TABLET BY MOUTH DAILY FOR MENTAL HEALTH, Disp: , Rfl:    phenazopyridine (PYRIDIUM) 200 MG tablet, Take 200 mg by mouth 3 (three) times daily as needed., Disp: , Rfl:    pregabalin (LYRICA) 25 MG capsule, Take by mouth., Disp: , Rfl:    riboflavin (VITAMIN B-2) 100 MG TABS tablet, TAKE FOUR TABLETS BY MOUTH DAILY FOR MIGRAINE PREVENTION, Disp: , Rfl:    sertraline (ZOLOFT) 100 MG tablet, TAKE ONE-HALF TABLET BY MOUTH IN THE MORNING FOR PTSD/MOOD FOR MENTAL HEALTH START IN 8 DAYS, AFTER DISCONTINUING PAROXETINE, Disp: , Rfl:    sildenafil (VIAGRA) 100 MG tablet, TAKE ONE TABLET BY MOUTH AS INSTRUCTED AS NEEDED ERECTILE DYSFUNCTION (TAKE 1 HOUR PRIOR TO SEXUAL ACTIVITY *DO NOT EXCEED 1 DOSE PER 24 HOUR PERIOD*), Disp: , Rfl:    tadalafil (CIALIS) 20 MG tablet, Take 0.5-1 tablets (10-20 mg total) by mouth daily as needed for erectile dysfunction (take 45 minutes prior to sexual activity)., Disp: 30 tablet, Rfl: 6   traMADol (ULTRAM) 50 MG tablet, Take 1 tablet by mouth every 8 (eight) hours as needed., Disp: , Rfl:    triamcinolone cream (KENALOG) 0.1 %, Apply topically., Disp: , Rfl:    Ubrogepant 100 MG TABS, TAKE ONE TABLET BY MOUTH DAILY AS NEEDED - NEW MEDICINE FOR MIGRAINE RELIEF, Disp: , Rfl:    valACYclovir (VALTREX) 1000 MG tablet, Take 1/2 tablet by mouth twice daily for 3 days or 1 tablet by mouth every day for 5 days at onset of breakout., Disp: 90 tablet, Rfl: 1   valACYclovir (VALTREX) 500 MG tablet, TAKE ONE TABLET BY MOUTH TWICE A DAY FOR 3 DAYS WITH HERPES BREAKOUTS, Disp: , Rfl:    methylPREDNISolone (MEDROL DOSEPAK) 4 MG TBPK tablet, Take as directed (Patient not taking: Reported on 02/11/2023), Disp: 21 each, Rfl: 0   nortriptyline (PAMELOR) 25 MG capsule, Take 1 capsule (25 mg total) by mouth at bedtime. (Patient not taking: Reported on 02/11/2023), Disp: 30 capsule, Rfl: 5   ondansetron (ZOFRAN-ODT) 8 MG disintegrating tablet, Take 1  tablet by mouth every 12 (twelve) hours. (Patient not taking: Reported on 02/11/2023), Disp: , Rfl:   Social History   Tobacco Use  Smoking Status Never  Smokeless Tobacco Never    Allergies  Allergen Reactions   Aleve [Naproxen] Rash    Patient stated it was years ago   Nsaids Other (See Comments)  Bleeding ulcers   Objective:   Vitals:   02/11/23 1527  BP: 132/80  Pulse: 88   There is no height or weight on file to calculate BMI. Constitutional Well developed. Well nourished.  Vascular Dorsalis pedis pulses palpable bilaterally. Posterior tibial pulses palpable bilaterally. Capillary refill normal to all digits.  No cyanosis or clubbing noted. Pedal hair growth normal.  Neurologic Normal speech. Oriented to person, place, and time. Epicritic sensation to light touch grossly present bilaterally.  Dermatologic Nails well groomed and normal in appearance. No open wounds. No skin lesions.  Orthopedic: Pain on palpation right first MPJ.  Mild pain with range of motion of the MPJ.  Pain on the medial eminence.  No deep intra-articular first MPJ pain noted.  No extensor or flexor tendinitis noted.  No pain at the sesamoidal complex.   Radiographs: 3 views of skeletally mature adult left foot.  There is increasing hypertrophy of the medial eminence.  No osteoarthritic changes noted at the first MPJ.  Sesamoid position is 5 out of 7. Assessment:   1. Capsulitis of metatarsophalangeal (MTP) joint of right foot   2. Hav (hallux abducto valgus), left      Plan:  Patient was evaluated and treated and all questions answered.  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  Given the amount of pain that he is experiencing I believe he will benefit from a steroid injection help decrease acute inflammatory component associate with pain.  Patient agrees with plan  like to proceed with a steroid injection. -Another steroid injection was performed at left first MPJ using 1% plain Lidocaine and  10 mg of Kenalog. This was well tolerated. -I discussed shoe gear modification with the patient in extensive detail as well. -Patient will eventually need an MPJ fusion to help address the deformity he states understanding he will think about it   No follow-ups on file.

## 2023-02-16 ENCOUNTER — Ambulatory Visit: Payer: Federal, State, Local not specified - PPO | Admitting: Podiatry

## 2023-02-17 ENCOUNTER — Encounter: Payer: Self-pay | Admitting: Internal Medicine

## 2023-02-17 NOTE — Progress Notes (Signed)
Cardiology Office Note:   Date:  02/19/2023  ID:  ARSHAAN HOSTLER, DOB 07-12-1965, MRN [id number] PCP:  Corwin Levins, MD  [facility] Providers Cardiologist:  Alverda Skeans, MD Referring MD: Corwin Levins, MD  Chief Complaint/Reason for Referral: Palpitations ASSESSMENT:    1. Palpitations   2. BMI 33.0-33.9,adult   3. CKD (chronic kidney disease) stage 2, GFR 60-89 ml/min   4. Diastolic dysfunction   5. Precordial pain     PLAN:   In order of problems listed above: Palpitations: He has had 2 monitors that have been reassuring.  I think his symptoms are mostly related to stress at home.  Continue to monitor and focus on stress management. Elevated BMI: Continue diet and exercise modification.  He is not a diabetic so a GLP-1 receptor agonist would not be covered by insurance. CKD stage II: Monitor for now Diastolic dysfunction: Mild LVH on echocardiogram.   Chest pain: Atypical in nature as it only occurs at home in his stressful environment.  He has no exertional angina outside of the home or during exercise.            Dispo:  Return in about 1 year (around 02/19/2024).      Medication Adjustments/Labs and Tests Ordered: Current medicines are reviewed at length with the patient today.  Concerns regarding medicines are outlined above.  The following changes have been made:  no change   Labs/tests ordered: Orders Placed This Encounter  Procedures   EKG 12-Lead    Medication Changes: No orders of the defined types were placed in this encounter.   Current medicines are reviewed at length with the patient today.  The patient does not have concerns regarding medicines.  History of Present Illness:      FOCUSED PROBLEM LIST:   Hyperlipidemia Obstructive sleep apnea not on CPAP History of DVT 2016 BMI 33 CKD stage II Palpitations Monitor 2023 asymptomatic NSVT Monitor 2024 [facility] rare PACs and PVCs  July 2023:  The patient is a 57 y.o. male with the indicated  medical history here for recommendations regarding tachycardia.  The patient was seen in his primary care provider's office recently.  He had complaints of a high heartbeat on his oximeter at times.  His heart rate and his PCPs office was abnormal at 90.  He was concerned and requested cardiology evaluation.  No laboratories were drawn.  He was referred for monitor.   The patient has been well.  He exercises on a regular basis.  He denies any exertional angina, exertional dyspnea, palpitations, paroxysmal nocturnal dyspnea, or orthopnea.  He has had no severe bleeding or bruising.  He is not required emergency room visits or hospitalizations.  He is otherwise well and without complaints.   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  Plan: Follow-up monitor, obtain echocardiogram, check reflex TSH, CBC, CMP; as needed follow-up.  October 2024: In the interim the monitor from 2023 and his echocardiogram were reassuring.  His echocardiogram did show mild LVH however.  His labs were within normal limits.  The patient reached out to our office due to palpitations.  He had a monitor placed at the [place] recently which demonstrated predominant sinus rhythm with rare PACs and PVCs without atrial fibrillation or ventricular tachycardia.  He is here to discuss further.  The patient tells me that in late July his daughter and her significant other moved in with him.  Since that time he has been  very stressed at home.  He will get a chest pain at home when he sees things at his house or not the way that he wants them.  Interestingly outside of his home he does not get chest pain.  He worked out at [employer] recently on [address] [employer] and had no episodes of chest discomfort.  When he traveled to [place] and [place] he had no chest discomfort.  He denies any shortness of breath.  He has had no significant palpitations recently.  He has noticed an right flank pain at times especially after returning  from [place].  He denies any other significant symptoms.          Current Medications: Current Meds  Medication Sig   acetaminophen (TYLENOL) 500 MG tablet Take 1,000 mg by mouth every 6 (six) hours as needed for moderate pain.   albuterol (PROVENTIL HFA;VENTOLIN HFA) 108 (90 Base) MCG/ACT inhaler Inhale 2 puffs into the lungs every 6 (six) hours as needed. For shortness of breath   aspirin EC 81 MG tablet Take 81 mg by mouth daily.   atorvastatin (LIPITOR) 40 MG tablet Take 1 tablet (40 mg total) by mouth daily.   Brinzolamide-Brimonidine 1-0.2 % SUSP Place 1 drop into the left eye 2 (two) times daily.    budesonide-formoterol (SYMBICORT) 160-4.5 MCG/ACT inhaler Inhale 2 puffs then rinse mouth, twice every day   busPIRone (BUSPAR) 10 MG tablet    busPIRone (BUSPAR) 15 MG tablet TAKE ONE TABLET BY MOUTH TWICE A DAY FOR ANXIETY FOR ANXIETY   carboxymethylcellulose (REFRESH PLUS) 0.5 % SOLN Apply to eye.   cetirizine (ZYRTEC) 10 MG tablet Take 1 tablet by mouth daily as needed.   Cholecalciferol (VITAMIN D3 PO) Take 1 tablet by mouth daily.   clotrimazole-betamethasone (LOTRISONE) cream Use as directed twice per day as needed to the affected area   cyclobenzaprine (FLEXERIL) 5 MG tablet Take 1 tablet (5 mg total) by mouth 3 (three) times daily as needed.   dextromethorphan-guaiFENesin (ROBITUSSIN-DM) 10-100 MG/5ML liquid    diclofenac Sodium (VOLTAREN) 1 % GEL Apply topically.   fluticasone (FLONASE) 50 MCG/ACT nasal spray Place into the nose.   ketoconazole (NIZORAL) 2 % cream Apply topically.   ketotifen (ZADITOR) 0.025 % ophthalmic solution Apply to eye.   methocarbamol (ROBAXIN) 500 MG tablet Take 1 tablet (500 mg total) by mouth 4 (four) times daily.   methylPREDNISolone (MEDROL DOSEPAK) 4 MG TBPK tablet Take as directed   Multiple Vitamin (MULTIVITAMIN) tablet Take 1 tablet by mouth daily.   nortriptyline (PAMELOR) 25 MG capsule Take 1 capsule (25 mg total) by mouth at bedtime.    ondansetron (ZOFRAN-ODT) 8 MG disintegrating tablet Take 1 tablet by mouth every 12 (twelve) hours.   OXcarbazepine (TRILEPTAL) 150 MG tablet Take 150 mg by mouth as needed (FOR ANXIETY/PTSD).   riboflavin (VITAMIN B-2) 100 MG TABS tablet TAKE FOUR TABLETS BY MOUTH DAILY FOR MIGRAINE PREVENTION   sertraline (ZOLOFT) 100 MG tablet TAKE ONE-HALF TABLET BY MOUTH IN THE MORNING FOR PTSD/MOOD FOR MENTAL HEALTH START IN 8 DAYS, AFTER DISCONTINUING PAROXETINE   sildenafil (VIAGRA) 100 MG tablet TAKE ONE TABLET BY MOUTH AS INSTRUCTED AS NEEDED ERECTILE DYSFUNCTION (TAKE 1 HOUR PRIOR TO SEXUAL ACTIVITY *DO NOT EXCEED 1 DOSE PER 24 HOUR PERIOD*)   tadalafil (CIALIS) 20 MG tablet Take 0.5-1 tablets (10-20 mg total) by mouth daily as needed for erectile dysfunction (take 45 minutes prior to sexual activity).   triamcinolone cream (KENALOG) 0.1 % Apply  topically.   Ubrogepant 100 MG TABS TAKE ONE TABLET BY MOUTH DAILY AS NEEDED - NEW MEDICINE FOR MIGRAINE RELIEF   valACYclovir (VALTREX) 1000 MG tablet Take 1/2 tablet by mouth twice daily for 3 days or 1 tablet by mouth every day for 5 days at onset of breakout.   valACYclovir (VALTREX) 500 MG tablet TAKE ONE TABLET BY MOUTH TWICE A DAY FOR 3 DAYS WITH HERPES BREAKOUTS   [DISCONTINUED] meclizine (ANTIVERT) 12.5 MG tablet Take 1 tablet (12.5 mg total) by mouth 3 (three) times daily as needed for dizziness.   [DISCONTINUED] PARoxetine (PAXIL) 40 MG tablet TAKE ONE-HALF TABLET BY MOUTH DAILY FOR MENTAL HEALTH   [DISCONTINUED] phenazopyridine (PYRIDIUM) 200 MG tablet Take 200 mg by mouth 3 (three) times daily as needed.   [DISCONTINUED] pregabalin (LYRICA) 25 MG capsule Take by mouth.   [DISCONTINUED] traMADol (ULTRAM) 50 MG tablet Take 1 tablet by mouth every 8 (eight) hours as needed.     Review of Systems:   Please see the history of present illness.    All other systems reviewed and are negative.     EKGs/Labs/Other Test Reviewed:   EKG: EKG performed  July 2023 that I reviewed demonstrates normal sinus rhythm  EKG Interpretation Date/Time:  Friday February 19 2023 13:54:55 EDT Ventricular Rate:  92 PR Interval:  162 QRS Duration:  74 QT Interval:  344 QTC Calculation: 425 R Axis:   20  Text Interpretation: Normal sinus rhythm Normal ECG When compared with ECG of 13-Sep-2020 19:18, No significant change was found Confirmed by Alverda Skeans ([id number]) on 02/19/2023 3:06:27 PM         Risk Assessment/Calculations:          Physical Exam:   VS:  BP 124/76   Pulse (!) 101   Ht 5\' 6"  (1.676 m)   Wt 217 lb 6.4 oz (98.6 kg)   SpO2 96%   BMI 35.09 kg/m        Wt Readings from Last 3 Encounters:  02/19/23 217 lb 6.4 oz (98.6 kg)  11/13/22 206 lb (93.4 kg)  10/07/22 208 lb (94.3 kg)      GENERAL:  No apparent distress, AOx3 HEENT:  No carotid bruits, +2 carotid impulses, no scleral icterus CAR: RRR no murmurs, gallops, rubs, or thrills RES:  Clear to auscultation bilaterally ABD:  Soft, nontender, nondistended, positive bowel sounds x 4 VASC:  +2 radial pulses, +2 carotid pulses NEURO:  CN 2-12 grossly intact; motor and sensory grossly intact PSYCH:  No active depression or anxiety EXT:  No edema, ecchymosis, or cyanosis  Signed, Orbie Pyo, MD  02/19/2023 3:07 PM    [facility] [address] [place], [place], [place]  [address] Phone: [phone]; Fax: [phone]   Note:  This document was prepared using Dragon voice recognition software and may include unintentional dictation errors.

## 2023-02-19 ENCOUNTER — Encounter: Payer: Self-pay | Admitting: Internal Medicine

## 2023-02-19 ENCOUNTER — Ambulatory Visit: Payer: Federal, State, Local not specified - PPO | Attending: Internal Medicine | Admitting: Internal Medicine

## 2023-02-19 VITALS — BP 124/76 | HR 101 | Ht 66.0 in | Wt 217.4 lb

## 2023-02-19 DIAGNOSIS — Z6833 Body mass index (BMI) 33.0-33.9, adult: Secondary | ICD-10-CM

## 2023-02-19 DIAGNOSIS — I5189 Other ill-defined heart diseases: Secondary | ICD-10-CM | POA: Diagnosis not present

## 2023-02-19 DIAGNOSIS — N182 Chronic kidney disease, stage 2 (mild): Secondary | ICD-10-CM | POA: Diagnosis not present

## 2023-02-19 DIAGNOSIS — R002 Palpitations: Secondary | ICD-10-CM | POA: Diagnosis not present

## 2023-02-19 DIAGNOSIS — R072 Precordial pain: Secondary | ICD-10-CM

## 2023-02-19 NOTE — Patient Instructions (Signed)
Medication Instructions:  No changes *If you need a refill on your cardiac medications before your next appointment, please call your pharmacy*   Lab Work: none If you have labs (blood work) drawn today and your tests are completely normal, you will receive your results only by: MyChart Message (if you have MyChart) OR A paper copy in the mail If you have any lab test that is abnormal or we need to change your treatment, we will call you to review the results.   Testing/Procedures: none   Follow-Up: At [facility], you and your health needs are our priority.  As part of our continuing mission to provide you with exceptional heart care, we have created designated Provider Care Teams.  These Care Teams include your primary Cardiologist (physician) and Advanced Practice Providers (APPs -  Physician Assistants and Nurse Practitioners) who all work together to provide you with the care you need, when you need it.   Your next appointment:   12 month(s)  Provider:   Jari Favre, PA-C, Ronie Spies, PA-C, Robin Searing, NP, Jacolyn Reedy, PA-C, Eligha Bridegroom, NP, Tereso Newcomer, PA-C, or Perlie Gold, PA-C

## 2023-02-24 NOTE — Progress Notes (Signed)
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 PFT: 10/08/2011-mild obstructive airways disease-small airways, insignificant response to bronchodilator Hypercoag panel 04/26/15- incr Protein C,/ managed by Carver Fila, FNP- Eliquis He brings military service records indicating wheezing and bronchospasm "probably secondary to pneumonia" in 1988 when he was in service in [place]. He was diagnosed with asthma and bronchopneumonia at that time. He says he had no asthma before that pneumonia and he denies family history of asthma. He requests a letter supporting his argument that asthma developed as a consequence of pneumonia which occurred while he was in [employer] service.  ---------------------------------------------------------------------------------------   02/23/22- 57 year old male never smoker, [employer],  followed for allergic Rhinitis, Asthma, complicated by history PE/DVT 2016, peptic ulcer, depression ([facility]), Glaucoma, OSA ([facility]), Covid infection jan, 2022,  -Advair 250, albuterol HFA, Flonase Covid vax- 2 Moderna Flu vax- declines OSA is managed by [place]. -----Pt states he had a asthma attack in august or September, but is doing better since Using Advair because he says the [place] would not give him brand-name.  Had an asthma flare about a month ago but no idea what the trigger was and he manages okay with his rescue inhaler.  We discussed medication and his history of glaucoma. Had tachycardia and wore a monitor from cardiology- developed when he got sick visiting grandchildren in [place].  He insists it was not COVID this time.  02/25/23- 57 year old male never smoker, [employer],  followed for allergic Rhinitis, Asthma, complicated by  history PE/DVT 2016, peptic ulcer, depression ([facility]), Glaucoma, OSA ([facility] manages), Covid infection jan, 2022,  -Symbicort 160, albuterol HFA, Flonase Resolved covid infection earlier this year. Declines flu vax. [facility]. Wants to continue to follow here. CXR 06/26/22-  IMPRESSION: No active cardiopulmonary disease.  Stable chest.   ROS-see HPI + = positive Constitutional:   No-   weight loss, night sweats, fevers, chills, fatigue, lassitude. HEENT:   No-  headaches, difficulty swallowing, tooth/dental problems, sore throat,    no-sneezing, no-itching, ear ache, no- nasal congestion, post nasal drip,  CV:  +chest pain, No- orthopnea, PND, swelling in lower extremities, anasarca, dizziness, palpitations Resp: +shortness of breath with exertion or at rest.              No-  productive cough,  No non-productive cough,  No- coughing up of blood.                change in color of mucus.  No- wheezing.   Skin:  GI:  No-   heartburn, indigestion, abdominal pain, nausea, vomiting, GU:, MS:  No-   joint pain or swelling.   Neuro-     nothing unusual Psych:  No- change in mood or affect. No depression or anxiety.  No memory loss.  OBJ- Physical Exam General- Alert, Oriented, Affect-appropriate, Distress- none acute Skin- + mild hyperkeratosis on anterior chest Lymphadenopathy- none Head- atraumatic            Eyes- Gross vision intact, PERRLA, conjunctivae and secretions clear            Ears- Hearing, canals-normal            Nose-no-sniffing, no-Septal dev, polyps, erosion, perforation             Throat- Mallampati III ,  mucosa clear , drainage- none, tonsils- atrophic Neck- flexible , trachea midline, no stridor , thyroid nl, carotid no bruit Chest - symmetrical excursion , unlabored           Heart/CV- RRR+ rapid , no murmur , no gallop  , no rub, nl s1 s2                           - JVD- none , edema- none, stasis changes- none, varices- none           Lung- clear to P&A,  wheeze-none, cough- none , dullness-none, rub- none           Chest wall-  Abd-  Br/ Gen/ Rectal- Not done, not indicated Extrem- cyanosis- none, clubbing, none, atrophy- none, strength- nl, negative Homan's Neuro- grossly intact to observation

## 2023-02-25 ENCOUNTER — Encounter: Payer: Self-pay | Admitting: Internal Medicine

## 2023-02-25 ENCOUNTER — Ambulatory Visit (INDEPENDENT_AMBULATORY_CARE_PROVIDER_SITE_OTHER): Payer: Medicare Other | Admitting: Internal Medicine

## 2023-02-25 VITALS — BP 112/82 | HR 74 | Ht 67.5 in | Wt 218.0 lb

## 2023-02-25 DIAGNOSIS — G4733 Obstructive sleep apnea (adult) (pediatric): Secondary | ICD-10-CM | POA: Diagnosis not present

## 2023-02-25 DIAGNOSIS — J4530 Mild persistent asthma, uncomplicated: Secondary | ICD-10-CM | POA: Diagnosis not present

## 2023-02-25 NOTE — Patient Instructions (Signed)
Glad you are doing well. Please call if we can help 

## 2023-03-09 ENCOUNTER — Encounter: Payer: Self-pay | Admitting: Internal Medicine

## 2023-03-09 ENCOUNTER — Ambulatory Visit (INDEPENDENT_AMBULATORY_CARE_PROVIDER_SITE_OTHER): Payer: Medicare Other

## 2023-03-09 VITALS — Ht 66.0 in | Wt 218.0 lb

## 2023-03-09 DIAGNOSIS — Z0000 Encounter for general adult medical examination without abnormal findings: Secondary | ICD-10-CM | POA: Diagnosis not present

## 2023-03-09 DIAGNOSIS — B009 Herpesviral infection, unspecified: Secondary | ICD-10-CM

## 2023-03-09 NOTE — Patient Instructions (Signed)
Mr. Sailors , Thank you for taking time to come for your Medicare Wellness Visit. I appreciate your ongoing commitment to your health goals. Please review the following plan we discussed and let me know if I can assist you in the future.   Referrals/Orders/Follow-Ups/Clinician Recommendations: Keep up the good work.  This is a list of the screening recommended for you and due dates:  Health Maintenance  Topic Date Due   COVID-19 Vaccine (4 - 2023-24 season) 01/03/2023   Flu Shot  08/02/2023*   Medicare Annual Wellness Visit  03/08/2024   Colon Cancer Screening  08/15/2030   DTaP/Tdap/Td vaccine (3 - Td or Tdap) 02/18/2033   Hepatitis C Screening  Completed   HIV Screening  Completed   HPV Vaccine  Aged Out   Zoster (Shingles) Vaccine  Discontinued  *Topic was postponed. The date shown is not the original due date.    Advanced directives: (Copy Requested) Please bring a copy of your health care power of attorney and living will to the office to be added to your chart at your convenience.  Next Medicare Annual Wellness Visit scheduled for next year: Yes

## 2023-03-09 NOTE — Progress Notes (Signed)
Subjective:   Anthony [name] is a 57 y.o. male who presents for an Initial Medicare Annual Wellness Visit.  Visit Complete: Virtual I connected with  Anthony [name] on 03/09/23 by a audio enabled telemedicine application and verified that I am speaking with the correct person using two identifiers.  Patient Location: Home  Provider Location: Home Office  I discussed the limitations of evaluation and management by telemedicine. The patient expressed understanding and agreed to proceed.  Vital Signs: Because this visit was a virtual/telehealth visit, some criteria may be missing or patient reported. Any vitals not documented were not able to be obtained and vitals that have been documented are patient reported.    Cardiac Risk Factors include: advanced age (>78men, >59 women);male gender;Other (see comment);dyslipidemia, Risk factor comments: Fatty liver, OSA,     Objective:    Today's Vitals   03/09/23 1343  Weight: 218 lb (98.9 kg)  Height: 5\' 6"  (1.676 m)   Body mass index is 35.19 kg/m.     03/09/2023    1:51 PM 02/02/2023    6:34 PM 10/07/2022   10:45 AM 09/23/2022    8:31 AM 06/04/2022    8:47 AM 11/25/2021   11:06 AM 05/21/2021    4:43 PM  Advanced Directives  Does Patient Have a Medical Advance Directive? No No No No No No No  Would patient like information on creating a medical advance directive?  No - Patient declined         Current Medications (verified) Outpatient Encounter Medications as of 03/09/2023  Medication Sig   acetaminophen (TYLENOL) 500 MG tablet Take 1,000 mg by mouth every 6 (six) hours as needed for moderate pain.   albuterol (PROVENTIL HFA;VENTOLIN HFA) 108 (90 Base) MCG/ACT inhaler Inhale 2 puffs into the lungs every 6 (six) hours as needed. For shortness of breath   aspirin EC 81 MG tablet Take 81 mg by mouth daily.   atorvastatin (LIPITOR) 40 MG tablet Take 1 tablet (40 mg total) by mouth daily.   Brinzolamide-Brimonidine 1-0.2 % SUSP Place 1  drop into the left eye 2 (two) times daily.    budesonide-formoterol (SYMBICORT) 160-4.5 MCG/ACT inhaler Inhale 2 puffs then rinse mouth, twice every day   busPIRone (BUSPAR) 10 MG tablet    busPIRone (BUSPAR) 15 MG tablet TAKE ONE TABLET BY MOUTH TWICE A DAY FOR ANXIETY FOR ANXIETY   carboxymethylcellulose (REFRESH PLUS) 0.5 % SOLN Apply to eye.   cetirizine (ZYRTEC) 10 MG tablet Take 1 tablet by mouth daily as needed.   Cholecalciferol (VITAMIN D3 PO) Take 1 tablet by mouth daily.   clotrimazole-betamethasone (LOTRISONE) cream Use as directed twice per day as needed to the affected area   cyclobenzaprine (FLEXERIL) 5 MG tablet Take 1 tablet (5 mg total) by mouth 3 (three) times daily as needed.   dextromethorphan-guaiFENesin (ROBITUSSIN-DM) 10-100 MG/5ML liquid    diclofenac Sodium (VOLTAREN) 1 % GEL Apply topically.   fluticasone (FLONASE) 50 MCG/ACT nasal spray Place into the nose.   ketoconazole (NIZORAL) 2 % cream Apply topically.   ketotifen (ZADITOR) 0.025 % ophthalmic solution Apply to eye.   methocarbamol (ROBAXIN) 500 MG tablet Take 1 tablet (500 mg total) by mouth 4 (four) times daily.   Multiple Vitamin (MULTIVITAMIN) tablet Take 1 tablet by mouth daily.   nortriptyline (PAMELOR) 25 MG capsule Take 1 capsule (25 mg total) by mouth at bedtime.   ondansetron (ZOFRAN-ODT) 8 MG disintegrating tablet Take 1 tablet by mouth every 12 (twelve)  hours.   OXcarbazepine (TRILEPTAL) 150 MG tablet Take 150 mg by mouth as needed (FOR ANXIETY/PTSD).   riboflavin (VITAMIN B-2) 100 MG TABS tablet TAKE FOUR TABLETS BY MOUTH DAILY FOR MIGRAINE PREVENTION   sertraline (ZOLOFT) 100 MG tablet TAKE ONE-HALF TABLET BY MOUTH IN THE MORNING FOR PTSD/MOOD FOR MENTAL HEALTH START IN 8 DAYS, AFTER DISCONTINUING PAROXETINE   sildenafil (VIAGRA) 100 MG tablet TAKE ONE TABLET BY MOUTH AS INSTRUCTED AS NEEDED ERECTILE DYSFUNCTION (TAKE 1 HOUR PRIOR TO SEXUAL ACTIVITY *DO NOT EXCEED 1 DOSE PER 24 HOUR PERIOD*)    tadalafil (CIALIS) 20 MG tablet Take 0.5-1 tablets (10-20 mg total) by mouth daily as needed for erectile dysfunction (take 45 minutes prior to sexual activity).   triamcinolone cream (KENALOG) 0.1 % Apply topically.   Ubrogepant 100 MG TABS TAKE ONE TABLET BY MOUTH DAILY AS NEEDED - NEW MEDICINE FOR MIGRAINE RELIEF   valACYclovir (VALTREX) 1000 MG tablet Take 1/2 tablet by mouth twice daily for 3 days or 1 tablet by mouth every day for 5 days at onset of breakout.   valACYclovir (VALTREX) 500 MG tablet TAKE ONE TABLET BY MOUTH TWICE A DAY FOR 3 DAYS WITH HERPES BREAKOUTS   [DISCONTINUED] omeprazole (PRILOSEC) 20 MG capsule Take 1 capsule (20 mg total) by mouth daily.   [DISCONTINUED] topiramate (TOPAMAX) 25 MG tablet topiramate 25 mg tablet (Patient not taking: No sig reported)   No facility-administered encounter medications on file as of 03/09/2023.    Allergies (verified) Aleve [naproxen] and Nsaids   History: Past Medical History:  Diagnosis Date   Allergy    Past hx    Anemia    Anxiety    Asthma    Cataract    removed left- forming on the right  DVT and PE from long travel trip    Chronic kidney disease 2006   kidney stones    Clotting disorder (HCC) 2016   PE - small    Complication of anesthesia    HARD TO WAKE UP AFTER 2ND EYE SURGERY   COVID-19 virus infection    Depression    DVT (deep venous thrombosis) (HCC)    DUE TO A LONG TRAVEL   Fatty liver    Genital herpes 05/06/2018   H/O renal calculi 2006   Headache    MIGRAINES   History of kidney stones    H/O   History of methicillin resistant staphylococcus aureus (MRSA)    Hyperlipidemia    Internal hemorrhoid    PE (pulmonary embolism)    Peptic ulcer disease    Pneumonia    h/o   PTSD (post-traumatic stress disorder)    Sleep apnea    DOES NOT USE CPAP CURRENTLY-MASK DOES NOT FIT CORRECTLY [facility]13-20)   Spondylosis    Tubular adenoma of colon 2011   Dr Juanda Chance   Past  Surgical History:  Procedure Laterality Date   CATARACT EXTRACTION Left 1996   Dr Chales Abrahams; OS   COLONOSCOPY     colonoscopy with polypectomy  2011   Dr Juanda Chance, hemorrhoids   CYSTOSCOPY W/ STONE MANIPULATION  2006   EYE SURGERY Left    INGUINAL HERNIA REPAIR Right 1990   POLYPECTOMY     SHOULDER SURGERY Right 12/17/2010   Dr Madelon Lips ; shoulder impingement & torn tendon   TRANSURETHRAL RESECTION OF PROSTATE N/A 02/23/2020   Procedure: TRANSURETHRAL RESECTION OF THE PROSTATE (TURP);  Surgeon: Sondra Come, MD;  Location:  [facility];  Service: Urology;  Laterality: N/A;   Family History  Problem Relation Age of Onset   Heart attack Mother 65   Dementia Father    Stroke Father 89   Diabetes Father        PVD   Glaucoma Father        blindness   Dementia Maternal Aunt    Heart attack Maternal Uncle         X2,pre 55   Esophageal cancer Maternal Uncle    Heart attack Maternal Grandmother 32   Cancer Paternal Grandmother        ? primary   Asthma Neg Hx    COPD Neg Hx    Colon cancer Neg Hx    Colon polyps Neg Hx    Stomach cancer Neg Hx    Rectal cancer Neg Hx    Social History   Socioeconomic History   Marital status: Divorced    Spouse name: Not on file   Number of children: 4   Years of education: 12   Highest education level: Not on file  Occupational History   Occupation: Disablity  Tobacco Use   Smoking status: Never   Smokeless tobacco: Never  Vaping Use   Vaping status: Never Used  Substance and Sexual Activity   Alcohol use: Yes    Alcohol/week: 0.0 standard drinks of alcohol    Comment: Socially , < 2X/ month   Drug use: No   Sexual activity: Yes    Birth control/protection: None    Comment: Last encounter: 10th of Jan. 2023, Wife.  Other Topics Concern   Not on file  Social History Narrative   Fun: [employer]   Right handed   Goes to the [place]    Drinks no caffeine   2 floor home   Daughter lives with him   Social Determinants of Health    Financial Resource Strain: Low Risk  (03/09/2023)   Overall Financial Resource Strain (CARDIA)    Difficulty of Paying Living Expenses: Not very hard  Food Insecurity: No Food Insecurity (03/09/2023)   Hunger Vital Sign    Worried About Running Out of Food in the Last Year: Never true    Ran Out of Food in the Last Year: Never true  Transportation Needs: No Transportation Needs (03/09/2023)   PRAPARE - Administrator, Civil Service (Medical): No    Lack of Transportation (Non-Medical): No  Physical Activity: Insufficiently Active (03/09/2023)   Exercise Vital Sign    Days of Exercise per Week: 3 days    Minutes of Exercise per Session: 30 min  Stress: Stress Concern Present (03/09/2023)   [employer] of Occupational Health - Occupational Stress Questionnaire    Feeling of Stress : Rather much  Social Connections: Moderately Isolated (03/09/2023)   Social Connection and Isolation Panel [NHANES]    Frequency of Communication with Friends and Family: More than three times a week    Frequency of Social Gatherings with Friends and Family: More than three times a week    Attends Religious Services: More than 4 times per year    Active Member of [employer] or Organizations: No    Attends Banker Meetings: Never    Marital Status: Divorced    Tobacco Counseling Counseling given: Not Answered   Clinical Intake:  Pre-visit preparation completed: Yes  Pain : No/denies pain     BMI - recorded: 35.19 Nutritional Status: BMI > 30  Obese Nutritional Risks:  None  How often do you need to have someone help you when you read instructions, pamphlets, or other written materials from your doctor or pharmacy?: 1 - Never  Interpreter Needed?: No  Information entered by :: Kenneisha Cochrane, RMA   Activities of Daily Living    03/09/2023    1:47 PM  In your present state of health, do you have any difficulty performing the following activities:  Hearing? 0  Vision?  0  Difficulty concentrating or making decisions? 0  Walking or climbing stairs? 0  Dressing or bathing? 0  Doing errands, shopping? 0  Preparing Food and eating ? N  Using the Toilet? N  In the past six months, have you accidently leaked urine? N  Do you have problems with loss of bowel control? N  Managing your Medications? N  Managing your Finances? N  Housekeeping or managing your Housekeeping? N    Patient Care Team: Corwin Levins, MD as PCP - General (Internal Medicine) Orbie Pyo, MD as PCP - Cardiology (Cardiology) Drema Dallas, DO as Consulting Physician (Neurology) Mateo Flow, MD as Consulting Physician (Ophthalmology)  Indicate any recent Medical Services you may have received from other than [facility] providers in the past year (date may be approximate).     Assessment:   This is a routine wellness examination for Anthony [name].  Hearing/Vision screen Hearing Screening - Comments:: Denies hearing difficulties   Vision Screening - Comments:: Wears eyeglasses   Goals Addressed               This Visit's Progress     Patient Stated (pt-stated)        Want to lose weight      Depression Screen    03/09/2023    1:58 PM 09/25/2022   10:50 AM 09/09/2022    1:30 PM 08/18/2022    2:37 PM 10/09/2021    2:27 PM 05/09/2021    2:19 PM 10/15/2020    1:51 PM  PHQ 2/9 Scores  PHQ - 2 Score 1 0 0 2 1 0 0  PHQ- 9 Score 2 3 0 5 2 0 0    Fall Risk    03/09/2023    1:51 PM 10/07/2022   10:45 AM 09/25/2022   10:50 AM 09/09/2022    1:30 PM 08/18/2022    2:37 PM  Fall Risk   Falls in the past year? 0 0 0 0 0  Number falls in past yr: 0 0 0 0 0  Injury with Fall? 0 0 0 0 0  Risk for fall due to : No Fall Risks  No Fall Risks No Fall Risks No Fall Risks  Follow up Falls prevention discussed;Falls evaluation completed Falls evaluation completed Falls evaluation completed Falls evaluation completed Falls evaluation completed    MEDICARE RISK AT HOME: Medicare Risk at  Home Any stairs in or around the home?: Yes If so, are there any without handrails?: Yes Home free of loose throw rugs in walkways, pet beds, electrical cords, etc?: Yes Adequate lighting in your home to reduce risk of falls?: Yes Life alert?: No Use of a cane, walker or w/c?: No Grab bars in the bathroom?: No Shower chair or bench in shower?: No Elevated toilet seat or a handicapped toilet?: No  TIMED UP AND GO:  Was the test performed? No    Cognitive Function:        03/09/2023    1:52 PM  6CIT Screen  What Year? 0 points  What month? 0 points  What time? 0 points  Count back from 20 0 points  Months in reverse 0 points  Repeat phrase 0 points  Total Score 0 points    Immunizations Immunization History  Administered Date(s) Administered   Moderna Sars-Covid-2 Vaccination 07/27/2019   PFIZER(Purple Top)SARS-COV-2 Vaccination 08/18/2019, 09/17/2019   Tdap 01/19/2011, 02/19/2023    TDAP status: Up to date  Flu Vaccine status: Declined, Education has been provided regarding the importance of this vaccine but patient still declined. Advised may receive this vaccine at local pharmacy or Health Dept. Aware to provide a copy of the vaccination record if obtained from local pharmacy or Health Dept. Verbalized acceptance and understanding.  Pneumococcal vaccine status: Declined,  Education has been provided regarding the importance of this vaccine but patient still declined. Advised may receive this vaccine at local pharmacy or Health Dept. Aware to provide a copy of the vaccination record if obtained from local pharmacy or Health Dept. Verbalized acceptance and understanding.   Covid-19 vaccine status: Declined, Education has been provided regarding the importance of this vaccine but patient still declined. Advised may receive this vaccine at local pharmacy or Health Dept.or vaccine clinic. Aware to provide a copy of the vaccination record if obtained from local pharmacy or  Health Dept. Verbalized acceptance and understanding.  Qualifies for Shingles Vaccine? Yes   Zostavax completed No   Shingrix Completed?: No.    Education has been provided regarding the importance of this vaccine. Patient has been advised to call insurance company to determine out of pocket expense if they have not yet received this vaccine. Advised may also receive vaccine at local pharmacy or Health Dept. Verbalized acceptance and understanding.  Screening Tests Health Maintenance  Topic Date Due   COVID-19 Vaccine (4 - 2023-24 season) 01/03/2023   INFLUENZA VACCINE  08/02/2023 (Originally 12/03/2022)   Medicare Annual Wellness (AWV)  03/08/2024   Colonoscopy  08/15/2030   DTaP/Tdap/Td (3 - Td or Tdap) 02/18/2033   Hepatitis C Screening  Completed   HIV Screening  Completed   HPV VACCINES  Aged Out   Zoster Vaccines- Shingrix  Discontinued    Health Maintenance  Health Maintenance Due  Topic Date Due   COVID-19 Vaccine (4 - 2023-24 season) 01/03/2023    Colorectal cancer screening: Type of screening: Colonoscopy. Completed 08/14/2020. Repeat every 10 years  Lung Cancer Screening: (Low Dose CT Chest recommended if Age 35-80 years, 20 pack-year currently smoking OR have quit w/in 15years.) does not qualify.   Lung Cancer Screening Referral: N/A  Additional Screening:  Hepatitis C Screening: does qualify; Completed 06/25/2020  Vision Screening: Recommended annual ophthalmology exams for early detection of glaucoma and other disorders of the eye. Is the patient up to date with their annual eye exam?  Yes  Who is the provider or what is the name of the office in which the patient attends annual eye exams? Dr. Elmer Picker If pt is not established with a provider, would they like to be referred to a provider to establish care? No .   Dental Screening: Recommended annual dental exams for proper oral hygiene   Community Resource Referral / Chronic Care Management: CRR required this  visit?  No   CCM required this visit?  No    Plan:     I have personally reviewed and noted the following in the patient's chart:   Medical and social history Use of alcohol, tobacco or illicit drugs  Current medications and supplements including opioid  prescriptions. Patient is not currently taking opioid prescriptions. Functional ability and status Nutritional status Physical activity Advanced directives List of other physicians Hospitalizations, surgeries, and ER visits in previous 12 months Vitals Screenings to include cognitive, depression, and falls Referrals and appointments  In addition, I have reviewed and discussed with patient certain preventive protocols, quality metrics, and best practice recommendations. A written personalized care plan for preventive services as well as general preventive health recommendations were provided to patient.     Anthony [name], CMA   03/09/2023   After Visit Summary: (MyChart) Due to this being a telephonic visit, the after visit summary with patients personalized plan was offered to patient via MyChart   Nurse Notes: Patient declines the Flu vaccine, Covid vaccine, and Shingrix vaccine.  He is up to date with all other health maintenance. Patient had no other concerns to address today.

## 2023-03-10 MED ORDER — VALACYCLOVIR HCL 1 G PO TABS
ORAL_TABLET | ORAL | 1 refills | Status: AC
Start: 2023-03-10 — End: ?

## 2023-03-12 ENCOUNTER — Encounter: Payer: Self-pay | Admitting: Internal Medicine

## 2023-03-12 ENCOUNTER — Ambulatory Visit (INDEPENDENT_AMBULATORY_CARE_PROVIDER_SITE_OTHER): Payer: Medicare Other | Admitting: Internal Medicine

## 2023-03-12 VITALS — BP 132/78 | HR 65 | Temp 97.8°F | Ht 66.0 in | Wt 214.0 lb

## 2023-03-12 DIAGNOSIS — R300 Dysuria: Secondary | ICD-10-CM

## 2023-03-12 DIAGNOSIS — R1011 Right upper quadrant pain: Secondary | ICD-10-CM | POA: Diagnosis not present

## 2023-03-12 DIAGNOSIS — E785 Hyperlipidemia, unspecified: Secondary | ICD-10-CM | POA: Diagnosis not present

## 2023-03-12 DIAGNOSIS — R739 Hyperglycemia, unspecified: Secondary | ICD-10-CM

## 2023-03-12 DIAGNOSIS — R002 Palpitations: Secondary | ICD-10-CM | POA: Insufficient documentation

## 2023-03-12 DIAGNOSIS — Z202 Contact with and (suspected) exposure to infections with a predominantly sexual mode of transmission: Secondary | ICD-10-CM | POA: Diagnosis not present

## 2023-03-12 DIAGNOSIS — G4733 Obstructive sleep apnea (adult) (pediatric): Secondary | ICD-10-CM | POA: Diagnosis not present

## 2023-03-12 DIAGNOSIS — Z114 Encounter for screening for human immunodeficiency virus [HIV]: Secondary | ICD-10-CM

## 2023-03-12 DIAGNOSIS — R7303 Prediabetes: Secondary | ICD-10-CM | POA: Insufficient documentation

## 2023-03-12 LAB — CBC WITH DIFFERENTIAL/PLATELET
Basophils Absolute: 0.1 10*3/uL (ref 0.0–0.1)
Basophils Relative: 1.5 % (ref 0.0–3.0)
Eosinophils Absolute: 0.5 10*3/uL (ref 0.0–0.7)
Eosinophils Relative: 10.4 % — ABNORMAL HIGH (ref 0.0–5.0)
HCT: 44.3 % (ref 39.0–52.0)
Hemoglobin: 14.7 g/dL (ref 13.0–17.0)
Lymphocytes Relative: 31.6 % (ref 12.0–46.0)
Lymphs Abs: 1.4 10*3/uL (ref 0.7–4.0)
MCHC: 33.2 g/dL (ref 30.0–36.0)
MCV: 85.7 fL (ref 78.0–100.0)
Monocytes Absolute: 0.4 10*3/uL (ref 0.1–1.0)
Monocytes Relative: 9.6 % (ref 3.0–12.0)
Neutro Abs: 2.1 10*3/uL (ref 1.4–7.7)
Neutrophils Relative %: 46.9 % (ref 43.0–77.0)
Platelets: 238 10*3/uL (ref 150.0–400.0)
RBC: 5.17 Mil/uL (ref 4.22–5.81)
RDW: 15.3 % (ref 11.5–15.5)
WBC: 4.5 10*3/uL (ref 4.0–10.5)

## 2023-03-12 LAB — URINALYSIS, ROUTINE W REFLEX MICROSCOPIC
Bilirubin Urine: NEGATIVE
Hgb urine dipstick: NEGATIVE
Ketones, ur: NEGATIVE
Leukocytes,Ua: NEGATIVE
Nitrite: NEGATIVE
RBC / HPF: NONE SEEN (ref 0–?)
Specific Gravity, Urine: 1.015 (ref 1.000–1.030)
Total Protein, Urine: NEGATIVE
Urine Glucose: NEGATIVE
Urobilinogen, UA: 0.2 (ref 0.0–1.0)
pH: 7.5 (ref 5.0–8.0)

## 2023-03-12 LAB — LIPID PANEL
Cholesterol: 132 mg/dL (ref 0–200)
HDL: 51.5 mg/dL (ref >39.00)
LDL Cholesterol: 65 mg/dL (ref 0–99)
NonHDL: 80.75
Total CHOL/HDL Ratio: 3
Triglycerides: 79 mg/dL (ref 0.0–149.0)
VLDL: 15.8 mg/dL (ref 0.0–40.0)

## 2023-03-12 LAB — HEPATIC FUNCTION PANEL
ALT: 22 U/L (ref 0–53)
AST: 20 U/L (ref 0–37)
Albumin: 4.5 g/dL (ref 3.5–5.2)
Alkaline Phosphatase: 79 U/L (ref 39–117)
Bilirubin, Direct: 0.1 mg/dL (ref 0.0–0.3)
Total Bilirubin: 0.8 mg/dL (ref 0.2–1.2)
Total Protein: 7.6 g/dL (ref 6.0–8.3)

## 2023-03-12 LAB — BASIC METABOLIC PANEL
BUN: 11 mg/dL (ref 6–23)
CO2: 27 meq/L (ref 19–32)
Calcium: 9.5 mg/dL (ref 8.4–10.5)
Chloride: 105 meq/L (ref 96–112)
Creatinine, Ser: 0.94 mg/dL (ref 0.40–1.50)
GFR: 90.86 mL/min (ref >60.00)
Glucose, Bld: 109 mg/dL — ABNORMAL HIGH (ref 70–99)
Potassium: 4.1 meq/L (ref 3.5–5.1)
Sodium: 137 meq/L (ref 135–145)

## 2023-03-12 LAB — HEMOGLOBIN A1C: Hgb A1c MFr Bld: 6.1 % (ref 4.6–6.5)

## 2023-03-12 LAB — LIPASE: Lipase: 23 U/L (ref 11.0–59.0)

## 2023-03-12 MED ORDER — DOXYCYCLINE HYCLATE 100 MG PO TABS: 100.0000 mg | ORAL_TABLET | Freq: Two times a day (BID) | ORAL | 0 refills | Status: DC

## 2023-03-12 MED ORDER — ATORVASTATIN CALCIUM 10 MG PO TABS: 10.0000 mg | ORAL_TABLET | Freq: Every day | ORAL | Status: DC

## 2023-03-12 NOTE — Assessment & Plan Note (Signed)
Lab Results  Component Value Date   HGBA1C 6.1 08/19/2022   Stable, pt to continue current medical treatment  - diet, wt control  

## 2023-03-12 NOTE — Patient Instructions (Addendum)
Please take all new medication as prescribed - the antibiotic for the prostate  Please continue all other medications as before, and refills have been done if requested.  Please have the pharmacy call with any other refills you may need.  Please continue your efforts at being more active, low cholesterol diet, and weight control.  Please keep your appointments with your specialists as you may have planned  Please go to the LAB at the blood drawing area for the tests to be done  You will be contacted by phone if any changes need to be made immediately.  Otherwise, you will receive a letter about your results with an explanation, but please check with MyChart first.  You will be contacted regarding the referral for: pulmonary for sleep apnea  Please make an Appointment to return in 6 months, or sooner if needed

## 2023-03-12 NOTE — Progress Notes (Signed)
Patient ID: Anthony [name], male   DOB: 1965/07/14, 57 y.o.   MRN: [id number]        Chief Complaint: follow up urinary urgency and dysuria, STD exposure, hld, RUQ pain       HPI:  Anthony [name] is a 57 y.o. male here with c/o 5 days onset mild dysuria with urinary urgency, last episode x 2 yrs, has been sexually active recent, no rash but wondering about STD exposure.  Denies urinary symptoms such as flank pain, hematuria or n/v, fever, chills.  Pt denies chest pain, increased sob or doe, wheezing, orthopnea, PND, increased LE swelling, palpitations, dizziness or syncope, but has had RUQ /right lower anterior chest pain Pt also reports he is on 10 mg lipitor in the past month per [place], though had ben on 40 mg last dose here.  Due for sleep apnea follow up and [facility].       Wt Readings from Last 3 Encounters:  03/12/23 214 lb (97.1 kg)  03/09/23 218 lb (98.9 kg)  02/25/23 218 lb (98.9 kg)   BP Readings from Last 3 Encounters:  03/12/23 132/78  02/25/23 112/82  02/19/23 124/76         Past Medical History:  Diagnosis Date   Allergy    Past hx    Anemia    Anxiety    Asthma    Cataract    removed left- forming on the right  DVT and PE from long travel trip    Chronic kidney disease 2006   kidney stones    Clotting disorder (HCC) 2016   PE - small    Complication of anesthesia    HARD TO WAKE UP AFTER 2ND EYE SURGERY   COVID-19 virus infection    Depression    DVT (deep venous thrombosis) (HCC)    DUE TO A LONG TRAVEL   Fatty liver    Genital herpes 05/06/2018   H/O renal calculi 2006   Headache    MIGRAINES   History of kidney stones    H/O   History of methicillin resistant staphylococcus aureus (MRSA)    Hyperlipidemia    Internal hemorrhoid    PE (pulmonary embolism)    Peptic ulcer disease    Pneumonia    h/o   PTSD (post-traumatic stress disorder)    Sleep apnea    DOES NOT USE CPAP CURRENTLY-MASK DOES NOT FIT CORRECTLY [facility]13[facility]20)   Spondylosis    Tubular adenoma of colon 2011   Dr Juanda Chance   Past Surgical History:  Procedure Laterality Date   CATARACT EXTRACTION Left 1996   Dr Chales Abrahams; OS   COLONOSCOPY     colonoscopy with polypectomy  2011   Dr Juanda Chance, hemorrhoids   CYSTOSCOPY W/ STONE MANIPULATION  2006   EYE SURGERY Left    INGUINAL HERNIA REPAIR Right 1990   POLYPECTOMY     SHOULDER SURGERY Right 12/17/2010   Dr Madelon Lips ; shoulder impingement & torn tendon   TRANSURETHRAL RESECTION OF PROSTATE N/A 02/23/2020   Procedure: TRANSURETHRAL RESECTION OF THE PROSTATE (TURP);  Surgeon: Sondra Come, MD;  Location: [facility] ORS;  Service: Urology;  Laterality: N/A;    reports that he has never smoked. He has never used smokeless tobacco. He reports current alcohol use. He reports that he does not use drugs. family history includes Cancer in his paternal grandmother; Dementia in his father and  maternal aunt; Diabetes in his father; Esophageal cancer in his maternal uncle; Glaucoma in his father; Heart attack in his maternal uncle; Heart attack (age of onset: 35) in his mother; Heart attack (age of onset: 71) in his maternal grandmother; Stroke (age of onset: 56) in his father. Allergies  Allergen Reactions   Aleve [Naproxen] Rash    Patient stated it was years ago   Nsaids Other (See Comments)    Bleeding ulcers   Current Outpatient Medications on File Prior to Visit  Medication Sig Dispense Refill   acetaminophen (TYLENOL) 500 MG tablet Take 1,000 mg by mouth every 6 (six) hours as needed for moderate pain.     albuterol (PROVENTIL HFA;VENTOLIN HFA) 108 (90 Base) MCG/ACT inhaler Inhale 2 puffs into the lungs every 6 (six) hours as needed. For shortness of breath 1 Inhaler 1   aspirin EC 81 MG tablet Take 81 mg by mouth daily.     Brinzolamide-Brimonidine 1-0.2 % SUSP Place 1 drop into the left eye 2 (two) times daily.      budesonide-formoterol (SYMBICORT) 160-4.5 MCG/ACT inhaler  Inhale 2 puffs then rinse mouth, twice every day 1 each 12   busPIRone (BUSPAR) 10 MG tablet      busPIRone (BUSPAR) 15 MG tablet TAKE ONE TABLET BY MOUTH TWICE A DAY FOR ANXIETY FOR ANXIETY     carboxymethylcellulose (REFRESH PLUS) 0.5 % SOLN Apply to eye.     cetirizine (ZYRTEC) 10 MG tablet Take 1 tablet by mouth daily as needed.     Cholecalciferol (VITAMIN D3 PO) Take 1 tablet by mouth daily.     clotrimazole-betamethasone (LOTRISONE) cream Use as directed twice per day as needed to the affected area 45 g 1   cyclobenzaprine (FLEXERIL) 5 MG tablet Take 1 tablet (5 mg total) by mouth 3 (three) times daily as needed. 40 tablet 1   dextromethorphan-guaiFENesin (ROBITUSSIN-DM) 10-100 MG/5ML liquid      diclofenac Sodium (VOLTAREN) 1 % GEL Apply topically.     fluticasone (FLONASE) 50 MCG/ACT nasal spray Place into the nose.     ketoconazole (NIZORAL) 2 % cream Apply topically.     ketotifen (ZADITOR) 0.025 % ophthalmic solution Apply to eye.     methocarbamol (ROBAXIN) 500 MG tablet Take 1 tablet (500 mg total) by mouth 4 (four) times daily. 60 tablet 2   Multiple Vitamin (MULTIVITAMIN) tablet Take 1 tablet by mouth daily.     nortriptyline (PAMELOR) 25 MG capsule Take 1 capsule (25 mg total) by mouth at bedtime. 30 capsule 5   ondansetron (ZOFRAN-ODT) 8 MG disintegrating tablet Take 1 tablet by mouth every 12 (twelve) hours.     OXcarbazepine (TRILEPTAL) 150 MG tablet Take 150 mg by mouth as needed (FOR ANXIETY/PTSD).     riboflavin (VITAMIN B-2) 100 MG TABS tablet TAKE FOUR TABLETS BY MOUTH DAILY FOR MIGRAINE PREVENTION     sertraline (ZOLOFT) 100 MG tablet TAKE ONE-HALF TABLET BY MOUTH IN THE MORNING FOR PTSD/MOOD FOR MENTAL HEALTH START IN 8 DAYS, AFTER DISCONTINUING PAROXETINE     sildenafil (VIAGRA) 100 MG tablet TAKE ONE TABLET BY MOUTH AS INSTRUCTED AS NEEDED ERECTILE DYSFUNCTION (TAKE 1 HOUR PRIOR TO SEXUAL ACTIVITY *DO NOT EXCEED 1 DOSE PER 24 HOUR PERIOD*)     tadalafil (CIALIS) 20  MG tablet Take 0.5-1 tablets (10-20 mg total) by mouth daily as needed for erectile dysfunction (take 45 minutes prior to sexual activity). 30 tablet 6   triamcinolone cream (KENALOG) 0.1 % Apply topically.  Ubrogepant 100 MG TABS TAKE ONE TABLET BY MOUTH DAILY AS NEEDED - NEW MEDICINE FOR MIGRAINE RELIEF     valACYclovir (VALTREX) 1000 MG tablet Take 1/2 tablet by mouth twice daily for 3 days or 1 tablet by mouth every day for 5 days at onset of breakout. 90 tablet 1   [DISCONTINUED] omeprazole (PRILOSEC) 20 MG capsule Take 1 capsule (20 mg total) by mouth daily. 30 capsule 0   [DISCONTINUED] topiramate (TOPAMAX) 25 MG tablet topiramate 25 mg tablet (Patient not taking: No sig reported)     No current facility-administered medications on file prior to visit.        ROS:  All others reviewed and negative.  Objective        PE:  BP 132/78 (BP Location: Left Arm, Patient Position: Sitting, Cuff Size: Normal)   Pulse 65   Temp 97.8 F (36.6 C) (Oral)   Ht 5\' 6"  (1.676 m)   Wt 214 lb (97.1 kg)   SpO2 98%   BMI 34.54 kg/m                 Constitutional: Pt appears in NAD               HENT: Head: NCAT.                Right Ear: External ear normal.                 Left Ear: External ear normal.                Eyes: . Pupils are equal, round, and reactive to light. Conjunctivae and EOM are normal               Nose: without d/c or deformity               Neck: Neck supple. Gross normal ROM               Cardiovascular: Normal rate and regular rhythm.                 Pulmonary/Chest: Effort normal and breath sounds without rales or wheezing.                Abd:  Soft, NT, ND, + BS, no organomegaly               Neurological: Pt is alert. At baseline orientation, motor grossly intact               Skin: Skin is warm. No rashes, no other new lesions, LE edema - none                Psychiatric: Pt behavior is normal without agitation   Micro: none  Cardiac tracings I have personally  interpreted today:  none  Pertinent Radiological findings (summarize): none   Lab Results  Component Value Date   WBC 4.0 08/19/2022   HGB 15.1 08/19/2022   HCT 44.6 08/19/2022   PLT 230.0 08/19/2022   GLUCOSE 104 (H) 08/19/2022   CHOL 170 09/07/2022   TRIG 116.0 09/07/2022   HDL 49.10 09/07/2022   LDLDIRECT 89.0 04/20/2019   LDLCALC 98 09/07/2022   ALT 17 09/07/2022   AST 17 09/07/2022   NA 138 08/19/2022   K 4.1 08/19/2022   CL 106 08/19/2022   CREATININE 0.97 08/19/2022   BUN 13 08/19/2022   CO2 23 08/19/2022   TSH 1.48 08/19/2022   PSA 0.53 08/19/2022  HGBA1C 6.1 08/19/2022   Assessment/Plan:  Anthony [name] is a 57 y.o. Black or African American [2] male with  has a past medical history of Allergy, Anemia, Anxiety, Asthma, Cataract, Chronic kidney disease (2006), Clotting disorder (HCC) (2016), Complication of anesthesia, COVID-19 virus infection, Depression, DVT (deep venous thrombosis) (HCC), Fatty liver, Genital herpes (05/06/2018), H/O renal calculi (2006), Headache, History of kidney stones, History of methicillin resistant staphylococcus aureus (MRSA), Hyperlipidemia, Internal hemorrhoid, PE (pulmonary embolism), Peptic ulcer disease, Pneumonia, PTSD (post-traumatic stress disorder), Sleep apnea, Spondylosis, and Tubular adenoma of colon (2011).  STD exposure Pt with concern - for f/u STD testing today as well  Obstructive sleep apnea For pulm referral per pt request  Hyperlipidemia Lab Results  Component Value Date   LDLCALC 98 09/07/2022   Uncontrolled on 40 mg, pt [facility], pt to continue current statin lipitor 10 and f/u lab   Hyperglycemia Lab Results  Component Value Date   HGBA1C 6.1 08/19/2022   Stable, pt to continue current medical treatment  - diet, wt control  Dysuria Also for UA and cx, and doxycycyline course for probable prostatitis   RUQ pain Likely msk but also for LFTs with labs  Followup: Return in about 6 months  (around 09/09/2023).  Oliver Barre, MD 03/12/2023 9:48 AM [facility]

## 2023-03-12 NOTE — Assessment & Plan Note (Signed)
Lab Results  Component Value Date   LDLCALC 98 09/07/2022   Uncontrolled on 40 mg, pt [facility], pt to continue current statin lipitor 10 and f/u lab

## 2023-03-12 NOTE — Assessment & Plan Note (Signed)
Pt with concern - for f/u STD testing today as well

## 2023-03-12 NOTE — Progress Notes (Signed)
The test results show that your current treatment is OK, as the tests are stable.  Please continue the same plan.  There is no other need for change of treatment or further evaluation based on these results, at this time.  thanks 

## 2023-03-12 NOTE — Assessment & Plan Note (Signed)
For pulm referral per pt request

## 2023-03-12 NOTE — Assessment & Plan Note (Signed)
Also for UA and cx, and doxycycyline course for probable prostatitis

## 2023-03-12 NOTE — Assessment & Plan Note (Signed)
Likely msk but also for LFTs with labs

## 2023-03-15 LAB — GC/CHLAMYDIA PROBE AMP
Chlamydia trachomatis, NAA: NEGATIVE
Neisseria Gonorrhoeae by PCR: NEGATIVE

## 2023-03-15 NOTE — Progress Notes (Signed)
The test results show that your current treatment is OK, as the tests are stable.  Please continue the same plan.  There is no other need for change of treatment or further evaluation based on these results, at this time.  thanks 

## 2023-03-16 LAB — HIV ANTIBODY (ROUTINE TESTING W REFLEX): HIV 1&2 Ab, 4th Generation: NONREACTIVE

## 2023-03-16 LAB — RPR: RPR Ser Ql: NONREACTIVE

## 2023-03-16 LAB — HSV 2 ANTIBODY, IGG: HSV 2 Glycoprotein G Ab, IgG: 4.06 {index} — ABNORMAL HIGH

## 2023-03-24 ENCOUNTER — Encounter: Payer: Self-pay | Admitting: Internal Medicine

## 2023-03-24 NOTE — Assessment & Plan Note (Signed)
[facility] 

## 2023-03-24 NOTE — Assessment & Plan Note (Signed)
Mild intermittent uncomplicated [facility] continue to fill inhalers. We can see him annually and if needed, per request

## 2023-03-30 ENCOUNTER — Encounter: Payer: Self-pay | Admitting: Internal Medicine

## 2023-04-20 ENCOUNTER — Ambulatory Visit: Payer: Federal, State, Local not specified - PPO | Admitting: Urology

## 2023-04-30 ENCOUNTER — Telehealth: Payer: Medicare Other | Admitting: Physician Assistant

## 2023-04-30 DIAGNOSIS — B9689 Other specified bacterial agents as the cause of diseases classified elsewhere: Secondary | ICD-10-CM | POA: Diagnosis not present

## 2023-04-30 DIAGNOSIS — J069 Acute upper respiratory infection, unspecified: Secondary | ICD-10-CM | POA: Diagnosis not present

## 2023-04-30 MED ORDER — AZITHROMYCIN 250 MG PO TABS: ORAL_TABLET | ORAL | 0 refills | Status: AC

## 2023-04-30 MED ORDER — PROMETHAZINE-DM 6.25-15 MG/5ML PO SYRP: 5.0000 mL | ORAL_SOLUTION | Freq: Four times a day (QID) | ORAL | 0 refills | Status: DC | PRN

## 2023-04-30 MED ORDER — BENZONATATE 100 MG PO CAPS: 100.0000 mg | ORAL_CAPSULE | Freq: Three times a day (TID) | ORAL | 0 refills | Status: DC | PRN

## 2023-04-30 NOTE — Progress Notes (Signed)
Virtual Visit Consent   Anthony [name], you are scheduled for a virtual visit with a [facility] provider today. Just as with appointments in the office, your consent must be obtained to participate. Your consent will be active for this visit and any virtual visit you may have with one of our providers in the next 365 days. If you have a MyChart account, a copy of this consent can be sent to you electronically.  As this is a virtual visit, video technology does not allow for your provider to perform a traditional examination. This may limit your provider's ability to fully assess your condition. If your provider identifies any concerns that need to be evaluated in person or the need to arrange testing (such as labs, EKG, etc.), we will make arrangements to do so. Although advances in technology are sophisticated, we cannot ensure that it will always work on either your end or our end. If the connection with a video visit is poor, the visit may have to be switched to a telephone visit. With either a video or telephone visit, we are not always able to ensure that we have a secure connection.  By engaging in this virtual visit, you consent to the provision of healthcare and authorize for your insurance to be billed (if applicable) for the services provided during this visit. Depending on your insurance coverage, you may receive a charge related to this service.  I need to obtain your verbal consent now. Are you willing to proceed with your visit today? Anthony [name] has provided verbal consent on 04/30/2023 for a virtual visit (video or telephone). Margaretann Loveless, PA-C  Date: 04/30/2023 9:09 AM  Virtual Visit via Video Note   I, Margaretann Loveless, connected with  Anthony [name]  ([id number], 1965-10-18) on 04/30/23 at  9:00 AM EST by a video-enabled telemedicine application and verified that I am speaking with the correct person using two identifiers.  Location: Patient: Virtual Visit Location  Patient: Home Provider: Virtual Visit Location Provider: Home Office   I discussed the limitations of evaluation and management by telemedicine and the availability of in person appointments. The patient expressed understanding and agreed to proceed.    History of Present Illness: Anthony [name] is a 57 y.o. who identifies as a male who was assigned male at birth, and is being seen today for cough and congestion.  HPI: URI  This is a new problem. The current episode started 1 to 4 weeks ago (1 week). The problem has been unchanged. There has been no fever. Associated symptoms include congestion, coughing, diarrhea (loose stool), headaches and a plugged ear sensation (occasional right ear). Pertinent negatives include no chest pain, ear pain, nausea, neck pain, rhinorrhea, sinus pain, sore throat, vomiting or wheezing. Associated symptoms comments: diaphoresis. Treatments tried: mucinex, hydrocodone. The treatment provided no relief.   At home covid test negative x 2   Problems:  Patient Active Problem List   Diagnosis Date Noted   Palpitations 03/12/2023   Prediabetes 03/12/2023   RUQ pain 03/12/2023   COVID-19 virus infection 11/15/2022   Vertigo 11/15/2022   Artificial lens present 08/18/2022   Age-related cataract of right eye 08/18/2022   Flat foot (pes planus) (acquired), unspecified foot 08/18/2022   Hypertrophic disorder of the skin, unspecified 08/18/2022   Major depressive disorder, recurrent, moderate (HCC) 08/18/2022   Ocular hypertension 08/18/2022   Erectile dysfunction 08/18/2022   Presbyopia 02/23/2022   Cervical radiculopathy 02/10/2022   Bilateral posterior  neck pain 01/17/2022   Tachycardia 10/11/2021   Dental caries on smooth surface penetrating into dentin 10/09/2021   Tachycardia, unspecified 10/09/2021   Chronic chest wall pain 06/26/2021   Ocular hypertension, 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   Unspecified disorder of refraction 05/09/2021   Bilateral impacted cerumen  04/26/2021   Cellulitis of left elbow 11/19/2020   Allergic rhinitis 10/15/2020   Brow ptosis, left 10/15/2020   Bunion of right foot 10/15/2020   Contact with and (suspected) exposure to covid-19 10/15/2020   Deposits (accretions) on teeth 10/15/2020   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, left eye, stage unspecified 10/15/2020   Hematuria, unspecified 10/15/2020   Inflammatory disease of prostate, unspecified 10/15/2020   Male erectile disorder (CODE) 10/15/2020   Male erectile dysfunction, unspecified 10/15/2020   Nondependent alcohol abuse, continuous drinking behavior 10/15/2020   Obesity 10/15/2020   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   Unspecified ptosis of left eyelid 10/15/2020   Unspecified symptoms and signs involving cognitive functions and awareness 10/15/2020   Other recurrent depressive disorders (HCC) 10/15/2020   Post-traumatic stress disorder, unspecified 10/15/2020   Adjustment disorder, unspecified 10/15/2020   Encounter for dental examination and cleaning without abnormal findings 10/15/2020   Recurrent major depression (HCC) 10/15/2020   Major depressive disorder, recurrent, unspecified (HCC) 10/15/2020   Mood disorder (HCC) 10/15/2020   Dietary counseling and surveillance 10/15/2020   Other specified counseling 10/15/2020   Headache, unspecified 10/15/2020   Herpes simplex 10/15/2020   Migraine with aura, intractable, with status migrainosus 10/15/2020   Migraine without aura, not intractable, with status migrainosus 10/15/2020   Low back pain, unspecified 10/15/2020   Pain in left leg 10/15/2020   Other asthma 10/15/2020   Meralgia paresthetica of left side 07/03/2020   Major depressive disorder, single episode, unspecified 06/25/2020   Leg cramping 09/04/2019   Pain in joint of right elbow 08/03/2019   Lateral epicondylitis of right elbow 08/03/2019   Nephrolithiasis 05/11/2019   Epigastric pain 05/11/2019   Other constipation 05/11/2019   Right sided  abdominal pain 05/11/2019   Paresthesia 04/20/2019   Chronic post-traumatic stress disorder 01/14/2019   Left ear hearing loss 01/14/2019   Low back pain 01/14/2019   Intractable migraine with aura without status migrainosus 01/07/2019   Genital herpes 05/06/2018   STD exposure 05/06/2018   Obstructive sleep apnea 02/13/2018   Hyperglycemia 01/24/2018   Pelvic pain 07/10/2017   Exposure to blood or body fluid 07/07/2017   Trigger thumb of right hand 05/21/2017   Trigger finger, right middle finger 03/18/2017   Anxiety 03/18/2017   Chronic tension-type headache, not intractable 02/12/2017   Dysuria 12/02/2016   Pain of right thumb 11/13/2016   Other chest pain 07/27/2016   Onychomycosis 06/12/2016   Encounter for well adult exam with abnormal findings 06/12/2016   Generalized headache 05/15/2016   Lumbar paraspinal muscle spasm 05/15/2016   History of DVT (deep vein thrombosis) 11/18/2015   Elevated PSA 11/18/2015   Acute pulmonary embolism (HCC) 2016 05/09/2015   Peptic ulcer 05/09/2015   Dyspnea 05/02/2015   DVT (deep venous thrombosis) (HCC) 04/26/2015   Duodenal ulcer 04/25/2015   Internal hemorrhoid, bleeding 04/04/2015   Acute gastrointestinal hemorrhage 04/03/2015   Duodenitis 04/03/2015   Postural dizziness with presyncope 04/03/2015   Microscopic hematuria 04/03/2015   Dislocated IOL (intraocular lens), initial encounter 12/10/2014   MRSA infection 09/14/2013   History of methicillin resistant Staphylococcus aureus infection 02/17/2013   Spondylosis of lumbar joint 09/28/2012   Fatty liver disease, nonalcoholic 09/28/2012  Lumbosacral spondylosis without myelopathy 09/28/2012   Fatty metamorphosis of liver 09/28/2012   Atopic dermatitis, unspecified 05/13/2012   Seasonal and perennial allergic rhinitis 10/01/2011   Family history of ischemic heart disease 04/02/2011   Low serum testosterone level 03/12/2011   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/12/2011   History of colonic  polyps 06/21/2009   Hyperlipidemia 02/16/2008   Asthma, mild persistent 02/16/2008   NONSPECIFIC ABNORMAL ELECTROCARDIOGRAM 02/16/2008   NEPHROLITHIASIS, HX OF 02/16/2008    Allergies:  Allergies  Allergen Reactions   Aleve [Naproxen] Rash    Patient stated it was years ago   Nsaids Other (See Comments)    Bleeding ulcers   Medications:  Current Outpatient Medications:    azithromycin (ZITHROMAX) 250 MG tablet, Take 2 tablets on day 1, then 1 tablet daily on days 2 through 5, Disp: 6 tablet, Rfl: 0   benzonatate (TESSALON) 100 MG capsule, Take 1-2 capsules (100-200 mg total) by mouth 3 (three) times daily as needed., Disp: 30 capsule, Rfl: 0   promethazine-dextromethorphan (PROMETHAZINE-DM) 6.25-15 MG/5ML syrup, Take 5 mLs by mouth 4 (four) times daily as needed., Disp: 118 mL, Rfl: 0   acetaminophen (TYLENOL) 500 MG tablet, Take 1,000 mg by mouth every 6 (six) hours as needed for moderate pain., Disp: , Rfl:    albuterol (PROVENTIL HFA;VENTOLIN HFA) 108 (90 Base) MCG/ACT inhaler, Inhale 2 puffs into the lungs every 6 (six) hours as needed. For shortness of breath, Disp: 1 Inhaler, Rfl: 1   aspirin EC 81 MG tablet, Take 81 mg by mouth daily., Disp: , Rfl:    atorvastatin (LIPITOR) 10 MG tablet, Take 1 tablet (10 mg total) by mouth daily., Disp: , Rfl:    Brinzolamide-Brimonidine 1-0.2 % SUSP, Place 1 drop into the left eye 2 (two) times daily. , Disp: , Rfl:    budesonide-formoterol (SYMBICORT) 160-4.5 MCG/ACT inhaler, Inhale 2 puffs then rinse mouth, twice every day, Disp: 1 each, Rfl: 12   busPIRone (BUSPAR) 10 MG tablet, , Disp: , Rfl:    busPIRone (BUSPAR) 15 MG tablet, TAKE ONE TABLET BY MOUTH TWICE A DAY FOR ANXIETY FOR ANXIETY, Disp: , Rfl:    carboxymethylcellulose (REFRESH PLUS) 0.5 % SOLN, Apply to eye., Disp: , Rfl:    cetirizine (ZYRTEC) 10 MG tablet, Take 1 tablet by mouth daily as needed., Disp: , Rfl:    Cholecalciferol (VITAMIN D3 PO), Take 1 tablet by mouth daily., Disp:  , Rfl:    clotrimazole-betamethasone (LOTRISONE) cream, Use as directed twice per day as needed to the affected area, Disp: 45 g, Rfl: 1   cyclobenzaprine (FLEXERIL) 5 MG tablet, Take 1 tablet (5 mg total) by mouth 3 (three) times daily as needed., Disp: 40 tablet, Rfl: 1   dextromethorphan-guaiFENesin (ROBITUSSIN-DM) 10-100 MG/5ML liquid, , Disp: , Rfl:    diclofenac Sodium (VOLTAREN) 1 % GEL, Apply topically., Disp: , Rfl:    doxycycline (VIBRA-TABS) 100 MG tablet, Take 1 tablet (100 mg total) by mouth 2 (two) times daily., Disp: 20 tablet, Rfl: 0   fluticasone (FLONASE) 50 MCG/ACT nasal spray, Place into the nose., Disp: , Rfl:    ketoconazole (NIZORAL) 2 % cream, Apply topically., Disp: , Rfl:    ketotifen (ZADITOR) 0.025 % ophthalmic solution, Apply to eye., Disp: , Rfl:    methocarbamol (ROBAXIN) 500 MG tablet, Take 1 tablet (500 mg total) by mouth 4 (four) times daily., Disp: 60 tablet, Rfl: 2   Multiple Vitamin (MULTIVITAMIN) tablet, Take 1 tablet by mouth daily., Disp: , Rfl:  nortriptyline (PAMELOR) 25 MG capsule, Take 1 capsule (25 mg total) by mouth at bedtime., Disp: 30 capsule, Rfl: 5   ondansetron (ZOFRAN-ODT) 8 MG disintegrating tablet, Take 1 tablet by mouth every 12 (twelve) hours., Disp: , Rfl:    OXcarbazepine (TRILEPTAL) 150 MG tablet, Take 150 mg by mouth as needed (FOR ANXIETY/PTSD)., Disp: , Rfl:    riboflavin (VITAMIN B-2) 100 MG TABS tablet, TAKE FOUR TABLETS BY MOUTH DAILY FOR MIGRAINE PREVENTION, Disp: , Rfl:    sertraline (ZOLOFT) 100 MG tablet, TAKE ONE-HALF TABLET BY MOUTH IN THE MORNING FOR PTSD/MOOD FOR MENTAL HEALTH START IN 8 DAYS, AFTER DISCONTINUING PAROXETINE, Disp: , Rfl:    sildenafil (VIAGRA) 100 MG tablet, TAKE ONE TABLET BY MOUTH AS INSTRUCTED AS NEEDED ERECTILE DYSFUNCTION (TAKE 1 HOUR PRIOR TO SEXUAL ACTIVITY *DO NOT EXCEED 1 DOSE PER 24 HOUR PERIOD*), Disp: , Rfl:    tadalafil (CIALIS) 20 MG tablet, Take 0.5-1 tablets (10-20 mg total) by mouth daily as  needed for erectile dysfunction (take 45 minutes prior to sexual activity)., Disp: 30 tablet, Rfl: 6   triamcinolone cream (KENALOG) 0.1 %, Apply topically., Disp: , Rfl:    Ubrogepant 100 MG TABS, TAKE ONE TABLET BY MOUTH DAILY AS NEEDED - NEW MEDICINE FOR MIGRAINE RELIEF, Disp: , Rfl:    valACYclovir (VALTREX) 1000 MG tablet, Take 1/2 tablet by mouth twice daily for 3 days or 1 tablet by mouth every day for 5 days at onset of breakout., Disp: 90 tablet, Rfl: 1  Observations/Objective: Patient is well-developed, well-nourished in no acute distress.  Resting comfortably at home.  Head is normocephalic, atraumatic.  No labored breathing.  Speech is clear and coherent with logical content.  Patient is alert and oriented at baseline.    Assessment and Plan: 1. Bacterial upper respiratory infection (Primary) - azithromycin (ZITHROMAX) 250 MG tablet; Take 2 tablets on day 1, then 1 tablet daily on days 2 through 5  Dispense: 6 tablet; Refill: 0 - benzonatate (TESSALON) 100 MG capsule; Take 1-2 capsules (100-200 mg total) by mouth 3 (three) times daily as needed.  Dispense: 30 capsule; Refill: 0 - promethazine-dextromethorphan (PROMETHAZINE-DM) 6.25-15 MG/5ML syrup; Take 5 mLs by mouth 4 (four) times daily as needed.  Dispense: 118 mL; Refill: 0  - Worsening over a week despite OTC medications - Will treat with Z-pack, Promethazine DM and tessalon perles - Can continue Mucinex  - Push fluids.  - Rest.  - Steam and humidifier can help - Seek in person evaluation if worsening or symptoms fail to improve    Follow Up Instructions: I discussed the assessment and treatment plan with the patient. The patient was provided an opportunity to ask questions and all were answered. The patient agreed with the plan and demonstrated an understanding of the instructions.  A copy of instructions were sent to the patient via MyChart unless otherwise noted below.    The patient was advised to call back or  seek an in-person evaluation if the symptoms worsen or if the condition fails to improve as anticipated.    Margaretann Loveless, PA-C

## 2023-04-30 NOTE — Patient Instructions (Addendum)
[name], thank you for joining Margaretann Loveless, PA-C for today's virtual visit.  While this provider is not your primary care provider (PCP), if your PCP is located in our provider database this encounter information will be shared with them immediately following your visit.   A [facility] MyChart account gives you access to today's visit and all your visits, tests, and labs performed at [facility] " click here if you don't have a [facility] MyChart account or go to [email or web address]  Consent: (Patient) [name] provided verbal consent for this virtual visit at the beginning of the encounter.  Current Medications:  Current Outpatient Medications:    azithromycin (ZITHROMAX) 250 MG tablet, Take 2 tablets on day 1, then 1 tablet daily on days 2 through 5, Disp: 6 tablet, Rfl: 0   benzonatate (TESSALON) 100 MG capsule, Take 1-2 capsules (100-200 mg total) by mouth 3 (three) times daily as needed., Disp: 30 capsule, Rfl: 0   promethazine-dextromethorphan (PROMETHAZINE-DM) 6.25-15 MG/5ML syrup, Take 5 mLs by mouth 4 (four) times daily as needed., Disp: 118 mL, Rfl: 0   acetaminophen (TYLENOL) 500 MG tablet, Take 1,000 mg by mouth every 6 (six) hours as needed for moderate pain., Disp: , Rfl:    albuterol (PROVENTIL HFA;VENTOLIN HFA) 108 (90 Base) MCG/ACT inhaler, Inhale 2 puffs into the lungs every 6 (six) hours as needed. For shortness of breath, Disp: 1 Inhaler, Rfl: 1   aspirin EC 81 MG tablet, Take 81 mg by mouth daily., Disp: , Rfl:    atorvastatin (LIPITOR) 10 MG tablet, Take 1 tablet (10 mg total) by mouth daily., Disp: , Rfl:    Brinzolamide-Brimonidine 1-0.2 % SUSP, Place 1 drop into the left eye 2 (two) times daily. , Disp: , Rfl:    budesonide-formoterol (SYMBICORT) 160-4.5 MCG/ACT inhaler, Inhale 2 puffs then rinse mouth, twice every day, Disp: 1 each, Rfl: 12   busPIRone (BUSPAR) 10 MG tablet, , Disp: , Rfl:    busPIRone (BUSPAR) 15 MG tablet,  TAKE ONE TABLET BY MOUTH TWICE A DAY FOR ANXIETY FOR ANXIETY, Disp: , Rfl:    carboxymethylcellulose (REFRESH PLUS) 0.5 % SOLN, Apply to eye., Disp: , Rfl:    cetirizine (ZYRTEC) 10 MG tablet, Take 1 tablet by mouth daily as needed., Disp: , Rfl:    Cholecalciferol (VITAMIN D3 PO), Take 1 tablet by mouth daily., Disp: , Rfl:    clotrimazole-betamethasone (LOTRISONE) cream, Use as directed twice per day as needed to the affected area, Disp: 45 g, Rfl: 1   cyclobenzaprine (FLEXERIL) 5 MG tablet, Take 1 tablet (5 mg total) by mouth 3 (three) times daily as needed., Disp: 40 tablet, Rfl: 1   dextromethorphan-guaiFENesin (ROBITUSSIN-DM) 10-100 MG/5ML liquid, , Disp: , Rfl:    diclofenac Sodium (VOLTAREN) 1 % GEL, Apply topically., Disp: , Rfl:    doxycycline (VIBRA-TABS) 100 MG tablet, Take 1 tablet (100 mg total) by mouth 2 (two) times daily., Disp: 20 tablet, Rfl: 0   fluticasone (FLONASE) 50 MCG/ACT nasal spray, Place into the nose., Disp: , Rfl:    ketoconazole (NIZORAL) 2 % cream, Apply topically., Disp: , Rfl:    ketotifen (ZADITOR) 0.025 % ophthalmic solution, Apply to eye., Disp: , Rfl:    methocarbamol (ROBAXIN) 500 MG tablet, Take 1 tablet (500 mg total) by mouth 4 (four) times daily., Disp: 60 tablet, Rfl: 2   Multiple Vitamin (MULTIVITAMIN) tablet, Take 1 tablet by mouth daily., Disp: , Rfl:    nortriptyline (PAMELOR) 25  MG capsule, Take 1 capsule (25 mg total) by mouth at bedtime., Disp: 30 capsule, Rfl: 5   ondansetron (ZOFRAN-ODT) 8 MG disintegrating tablet, Take 1 tablet by mouth every 12 (twelve) hours., Disp: , Rfl:    OXcarbazepine (TRILEPTAL) 150 MG tablet, Take 150 mg by mouth as needed (FOR ANXIETY/PTSD)., Disp: , Rfl:    riboflavin (VITAMIN B-2) 100 MG TABS tablet, TAKE FOUR TABLETS BY MOUTH DAILY FOR MIGRAINE PREVENTION, Disp: , Rfl:    sertraline (ZOLOFT) 100 MG tablet, TAKE ONE-HALF TABLET BY MOUTH IN THE MORNING FOR PTSD/MOOD FOR MENTAL HEALTH START IN 8 DAYS, AFTER  DISCONTINUING PAROXETINE, Disp: , Rfl:    sildenafil (VIAGRA) 100 MG tablet, TAKE ONE TABLET BY MOUTH AS INSTRUCTED AS NEEDED ERECTILE DYSFUNCTION (TAKE 1 HOUR PRIOR TO SEXUAL ACTIVITY *DO NOT EXCEED 1 DOSE PER 24 HOUR PERIOD*), Disp: , Rfl:    tadalafil (CIALIS) 20 MG tablet, Take 0.5-1 tablets (10-20 mg total) by mouth daily as needed for erectile dysfunction (take 45 minutes prior to sexual activity)., Disp: 30 tablet, Rfl: 6   triamcinolone cream (KENALOG) 0.1 %, Apply topically., Disp: , Rfl:    Ubrogepant 100 MG TABS, TAKE ONE TABLET BY MOUTH DAILY AS NEEDED - NEW MEDICINE FOR MIGRAINE RELIEF, Disp: , Rfl:    valACYclovir (VALTREX) 1000 MG tablet, Take 1/2 tablet by mouth twice daily for 3 days or 1 tablet by mouth every day for 5 days at onset of breakout., Disp: 90 tablet, Rfl: 1   Medications ordered in this encounter:  Meds ordered this encounter  Medications   azithromycin (ZITHROMAX) 250 MG tablet    Sig: Take 2 tablets on day 1, then 1 tablet daily on days 2 through 5    Dispense:  6 tablet    Refill:  0    Supervising Provider:   Merrilee Jansky [[id number]]   benzonatate (TESSALON) 100 MG capsule    Sig: Take 1-2 capsules (100-200 mg total) by mouth 3 (three) times daily as needed.    Dispense:  30 capsule    Refill:  0    Supervising Provider:   Merrilee Jansky [[id number]]   promethazine-dextromethorphan (PROMETHAZINE-DM) 6.25-15 MG/5ML syrup    Sig: Take 5 mLs by mouth 4 (four) times daily as needed.    Dispense:  118 mL    Refill:  0    Supervising Provider:   Merrilee Jansky [[id number]]     *If you need refills on other medications prior to your next appointment, please contact your pharmacy*  Follow-Up: Call back or seek an in-person evaluation if the symptoms worsen or if the condition fails to improve as anticipated.  [facility] [phone]  Other Instructions  Upper Respiratory Infection, Adult An upper respiratory infection (URI) is a  common viral infection of the nose, throat, and upper air passages that lead to the lungs. The most common type of URI is the common cold. URIs usually get better on their own, without medical treatment. What are the causes? A URI is caused by a virus. You may catch a virus by: Breathing in droplets from an infected person's cough or sneeze. Touching something that has been exposed to the virus (is contaminated) and then touching your mouth, nose, or eyes. What increases the risk? You are more likely to get a URI if: You are very young or very old. You have close contact with others, such as at work, school, or a health care facility. You smoke.  You have long-term (chronic) heart or lung disease. You have a weakened disease-fighting system (immune system). You have nasal allergies or asthma. You are experiencing a lot of stress. You have poor nutrition. What are the signs or symptoms? A URI usually involves some of the following symptoms: Runny or stuffy (congested) nose. Cough. Sneezing. Sore throat. Headache. Fatigue. Fever. Loss of appetite. Pain in your forehead, behind your eyes, and over your cheekbones (sinus pain). Muscle aches. Redness or irritation of the eyes. Pressure in the ears or face. How is this diagnosed? This condition may be diagnosed based on your medical history and symptoms, and a physical exam. Your health care provider may use a swab to take a mucus sample from your nose (nasal swab). This sample can be tested to determine what virus is causing the illness. How is this treated? URIs usually get better on their own within 7-10 days. Medicines cannot cure URIs, but your health care provider may recommend certain medicines to help relieve symptoms, such as: Over-the-counter cold medicines. Cough suppressants. Coughing is a type of defense against infection that helps to clear the respiratory system, so take these medicines only as recommended by your health  care provider. Fever-reducing medicines. Follow these instructions at home: Activity Rest as needed. If you have a fever, stay home from work or school until your fever is gone or until your health care provider says your URI cannot spread to other people (is no longer contagious). Your health care provider may have you wear a face mask to prevent your infection from spreading. Relieving symptoms Gargle with a mixture of salt and water 3-4 times a day or as needed. To make salt water, completely dissolve -1 tsp (3-6 g) of salt in 1 cup (237 mL) of warm water. Use a cool-mist humidifier to add moisture to the air. This can help you breathe more easily. Eating and drinking  Drink enough fluid to keep your urine pale yellow. Eat soups and other clear broths. General instructions  Take over-the-counter and prescription medicines only as told by your health care provider. These include cold medicines, fever reducers, and cough suppressants. Do not use any products that contain nicotine or tobacco. These products include cigarettes, chewing tobacco, and vaping devices, such as e-cigarettes. If you need help quitting, ask your health care provider. Stay away from secondhand smoke. Stay up to date on all immunizations, including the yearly (annual) flu vaccine. Keep all follow-up visits. This is important. How to prevent the spread of infection to others URIs can be contagious. To prevent the infection from spreading: Wash your hands with soap and water for at least 20 seconds. If soap and water are not available, use hand sanitizer. Avoid touching your mouth, face, eyes, or nose. Cough or sneeze into a tissue or your sleeve or elbow instead of into your hand or into the air.  Contact a health care provider if: You are getting worse instead of better. You have a fever or chills. Your mucus is brown or red. You have yellow or brown discharge coming from your nose. You have pain in your face,  especially when you bend forward. You have swollen neck glands. You have pain while swallowing. You have white areas in the back of your throat. Get help right away if: You have shortness of breath that gets worse. You have severe or persistent: Headache. Ear pain. Sinus pain. Chest pain. You have chronic lung disease along with any of the following: Making high-pitched whistling  sounds when you breathe, most often when you breathe out (wheezing). Prolonged cough (more than 14 days). Coughing up blood. A change in your usual mucus. You have a stiff neck. You have changes in your: Vision. Hearing. Thinking. Mood. These symptoms may be an emergency. Get help right away. Call 911. Do not wait to see if the symptoms will go away. Do not drive yourself to the hospital. Summary An upper respiratory infection (URI) is a common infection of the nose, throat, and upper air passages that lead to the lungs. A URI is caused by a virus. URIs usually get better on their own within 7-10 days. Medicines cannot cure URIs, but your health care provider may recommend certain medicines to help relieve symptoms. This information is not intended to replace advice given to you by your health care provider. Make sure you discuss any questions you have with your health care provider. Document Revised: 11/20/2020 Document Reviewed: 11/20/2020 Elsevier Patient Education  2024 Elsevier Inc.    If you have been instructed to have an in-person evaluation today at a local Urgent Care facility, please use the link below. It will take you to a list of all of our available [facility], including address, phone number and hours of operation. Please do not delay care.  [facility]  If you or a family member do not have a primary care provider, use the link below to schedule a visit and establish care. When you choose a [facility] primary care physician or advanced practice provider,  you gain a long-term partner in health. Find a Primary Care Provider  Learn more about [facility]'s in-office and virtual care options: [facility] - Get Care Now

## 2023-05-11 ENCOUNTER — Other Ambulatory Visit: Payer: Self-pay

## 2023-05-11 DIAGNOSIS — R300 Dysuria: Secondary | ICD-10-CM

## 2023-05-11 DIAGNOSIS — R350 Frequency of micturition: Secondary | ICD-10-CM

## 2023-05-12 ENCOUNTER — Other Ambulatory Visit
Admission: RE | Admit: 2023-05-12 | Discharge: 2023-05-12 | Disposition: A | Discharge status: Home / Self Care | Payer: Medicare Other | Attending: Urology | Admitting: Urology

## 2023-05-12 ENCOUNTER — Ambulatory Visit: Payer: Medicare Other | Admitting: Urology

## 2023-05-12 VITALS — BP 158/89 | Ht 66.0 in | Wt 216.6 lb

## 2023-05-12 DIAGNOSIS — R300 Dysuria: Secondary | ICD-10-CM | POA: Diagnosis present

## 2023-05-12 DIAGNOSIS — R350 Frequency of micturition: Secondary | ICD-10-CM | POA: Diagnosis present

## 2023-05-12 LAB — URINALYSIS, COMPLETE (UACMP) WITH MICROSCOPIC
Bacteria, UA: NONE SEEN
Bilirubin Urine: NEGATIVE
Glucose, UA: NEGATIVE mg/dL
Hgb urine dipstick: NEGATIVE
Ketones, ur: NEGATIVE mg/dL
Leukocytes,Ua: NEGATIVE
Nitrite: NEGATIVE
Protein, ur: NEGATIVE mg/dL
Specific Gravity, Urine: 1.01 (ref 1.005–1.030)
Squamous Epithelial / HPF: NONE SEEN /[HPF] (ref 0–5)
WBC, UA: NONE SEEN WBC/hpf (ref 0–5)
pH: 6 (ref 5.0–8.0)

## 2023-05-12 LAB — BLADDER SCAN AMB NON-IMAGING

## 2023-05-12 NOTE — Progress Notes (Signed)
   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  Pathology showed only cystitis cystica.  This had resolved his burning with urination and other urinary symptoms.   He overall has done well, but has had intermittent urgent care visits for dysuria and treated with courses of antibiotics.  He has previously deferred STD testing intermittently.  Most recently, developed some burning after sexual activity and was seen by PCP in November 2024.  Urinalysis was benign, genital herpes was positive, but G/C and syphilis were  negative.  It sounds like he was treated with doxycycline  at that time without any significant improvement in the urinary symptoms.  They resolve spontaneously after a few weeks.  He denies any complaints today.  Urinalysis today is completely benign.  PSAs have been normal.  He is a very challenging historian again today.  He also has a urologist at the [facility], but those records are not available to me.  PVR today normal at 50ml.  I recommended he avoid any chemical irritation of the urethral meatus, and return precautions were discussed.  Could also consider a trial of acyclovir if recurrent symptoms.  Atypicals sent today 1 year follow-up with [facility] If recurrent urinary symptoms recommend urinalysis, STD testing, atypicals  Anthony JAYSON Burnet, MD  [facility] [address] [place], [place] [address] [phone]

## 2023-05-13 LAB — URINE CULTURE: Culture: NO GROWTH

## 2023-05-18 LAB — MISC LABCORP TEST (SEND OUT): Labcorp test code: 86884

## 2023-05-25 ENCOUNTER — Ambulatory Visit: Payer: Medicare Other | Admitting: Internal Medicine

## 2023-05-25 ENCOUNTER — Encounter: Payer: Self-pay | Admitting: Internal Medicine

## 2023-05-25 VITALS — BP 122/84 | HR 79 | Temp 97.6°F | Ht 66.0 in | Wt 211.0 lb

## 2023-05-25 DIAGNOSIS — R7303 Prediabetes: Secondary | ICD-10-CM | POA: Diagnosis not present

## 2023-05-25 DIAGNOSIS — E669 Obesity, unspecified: Secondary | ICD-10-CM

## 2023-05-25 DIAGNOSIS — R1011 Right upper quadrant pain: Secondary | ICD-10-CM | POA: Diagnosis not present

## 2023-05-25 DIAGNOSIS — E785 Hyperlipidemia, unspecified: Secondary | ICD-10-CM | POA: Diagnosis not present

## 2023-05-25 DIAGNOSIS — R739 Hyperglycemia, unspecified: Secondary | ICD-10-CM

## 2023-05-25 NOTE — Patient Instructions (Signed)
Please continue all other medications as before, and refills have been done if requested.  Please have the pharmacy call with any other refills you may need.  Please continue your efforts at being more active, low cholesterol diet, and weight control.  Please keep your appointments with your specialists as you may have planned  You will be contacted regarding the referral for: Nutrition

## 2023-05-25 NOTE — Progress Notes (Unsigned)
Patient ID: Anthony [name], male   DOB: 1965/07/12, 58 y.o.   MRN: [id number]        Chief Complaint: follow up ruq pain, obesity, hyperglycemia, hld, htn       HPI:  Anthony [name] is a 58 y.o. male here overall doing ok, but does has sharp ruq pain with bending forward, sort of pinching.feeling  Hard to lose wt, asks for nutrition referral.  Pt denies chest pain, increased sob or doe, wheezing, orthopnea, PND, increased LE swelling, palpitations, dizziness or syncope.   Pt denies polydipsia, polyuria, or new focal neuro s/s.    Pt denies fever, wt loss, night sweats, loss of appetite, or other constitutional symptoms         Wt Readings from Last 3 Encounters:  05/25/23 211 lb (95.7 kg)  05/12/23 216 lb 9.6 oz (98.2 kg)  03/12/23 214 lb (97.1 kg)   BP Readings from Last 3 Encounters:  05/25/23 122/84  05/12/23 (!) 158/89  03/12/23 132/78         Past Medical History:  Diagnosis Date   Allergy    Past hx    Anemia    Anxiety    Asthma    Cataract    removed left- forming on the right  DVT and PE from long travel trip    Chronic kidney disease 2006   kidney stones    Clotting disorder (HCC) 2016   PE - small    Complication of anesthesia    HARD TO WAKE UP AFTER 2ND EYE SURGERY   COVID-19 virus infection    Depression    DVT (deep venous thrombosis) (HCC)    DUE TO A LONG TRAVEL   Fatty liver    Genital herpes 05/06/2018   H/O renal calculi 2006   Headache    MIGRAINES   History of kidney stones    H/O   History of methicillin resistant staphylococcus aureus (MRSA)    Hyperlipidemia    Internal hemorrhoid    PE (pulmonary embolism)    Peptic ulcer disease    Pneumonia    h/o   PTSD (post-traumatic stress disorder)    Sleep apnea    DOES NOT USE CPAP CURRENTLY-MASK DOES NOT FIT CORRECTLY [facility]13[facility]20)   Spondylosis    Tubular adenoma of colon 2011   Dr Juanda Chance   Past Surgical History:  Procedure Laterality Date   CATARACT  EXTRACTION Left 1996   Dr Chales Abrahams; OS   COLONOSCOPY     colonoscopy with polypectomy  2011   Dr Juanda Chance, hemorrhoids   CYSTOSCOPY W/ STONE MANIPULATION  2006   EYE SURGERY Left    INGUINAL HERNIA REPAIR Right 1990   POLYPECTOMY     SHOULDER SURGERY Right 12/17/2010   Dr Madelon Lips ; shoulder impingement & torn tendon   TRANSURETHRAL RESECTION OF PROSTATE N/A 02/23/2020   Procedure: TRANSURETHRAL RESECTION OF THE PROSTATE (TURP);  Surgeon: Sondra Come, MD;  Location: [facility] ORS;  Service: Urology;  Laterality: N/A;    reports that he has never smoked. He has never used smokeless tobacco. He reports current alcohol use. He reports that he does not use drugs. family history includes Cancer in his paternal grandmother; Dementia in his father and maternal aunt; Diabetes in his father; Esophageal cancer in his maternal uncle; Glaucoma in his father; Heart attack in his maternal uncle; Heart attack (age of onset: 64) in his mother; Heart attack (age  of onset: 110) in his maternal grandmother; Stroke (age of onset: 24) in his father. Allergies  Allergen Reactions   Aleve [Naproxen] Rash    Patient stated it was years ago   Nsaids Other (See Comments)    Bleeding ulcers   Current Outpatient Medications on File Prior to Visit  Medication Sig Dispense Refill   acetaminophen (TYLENOL) 500 MG tablet Take 1,000 mg by mouth every 6 (six) hours as needed for moderate pain.     albuterol (PROVENTIL HFA;VENTOLIN HFA) 108 (90 Base) MCG/ACT inhaler Inhale 2 puffs into the lungs every 6 (six) hours as needed. For shortness of breath 1 Inhaler 1   aspirin EC 81 MG tablet Take 81 mg by mouth daily.     atorvastatin (LIPITOR) 10 MG tablet Take 1 tablet (10 mg total) by mouth daily.     Cholecalciferol (VITAMIN D3 PO) Take 1 tablet by mouth daily.     Multiple Vitamin (MULTIVITAMIN) tablet Take 1 tablet by mouth daily.     sildenafil (VIAGRA) 100 MG tablet TAKE ONE TABLET BY MOUTH AS INSTRUCTED AS NEEDED ERECTILE  DYSFUNCTION (TAKE 1 HOUR PRIOR TO SEXUAL ACTIVITY *DO NOT EXCEED 1 DOSE PER 24 HOUR PERIOD*)     tadalafil (CIALIS) 20 MG tablet Take 0.5-1 tablets (10-20 mg total) by mouth daily as needed for erectile dysfunction (take 45 minutes prior to sexual activity). 30 tablet 6   valACYclovir (VALTREX) 1000 MG tablet Take 1/2 tablet by mouth twice daily for 3 days or 1 tablet by mouth every day for 5 days at onset of breakout. 90 tablet 1   [DISCONTINUED] omeprazole (PRILOSEC) 20 MG capsule Take 1 capsule (20 mg total) by mouth daily. 30 capsule 0   [DISCONTINUED] topiramate (TOPAMAX) 25 MG tablet topiramate 25 mg tablet (Patient not taking: No sig reported)     No current facility-administered medications on file prior to visit.        ROS:  All others reviewed and negative.  Objective        PE:  BP 122/84 (BP Location: Left Arm, Patient Position: Sitting, Cuff Size: Large)   Pulse 79   Temp 97.6 F (36.4 C) (Temporal)   Ht 5\' 6"  (1.676 m)   Wt 211 lb (95.7 kg)   SpO2 98%   BMI 34.06 kg/m                 Constitutional: Pt appears in NAD               HENT: Head: NCAT.                Right Ear: External ear normal.                 Left Ear: External ear normal.                Eyes: . Pupils are equal, round, and reactive to light. Conjunctivae and EOM are normal               Nose: without d/c or deformity               Neck: Neck supple. Gross normal ROM               Cardiovascular: Normal rate and regular rhythm.                 Pulmonary/Chest: Effort normal and breath sounds without rales or wheezing.  Abd:  Soft, NT, ND, + BS, no organomegaly               Neurological: Pt is alert. At baseline orientation, motor grossly intact               Skin: Skin is warm. No rashes, no other new lesions, LE edema - none               Psychiatric: Pt behavior is normal without agitation   Micro: none  Cardiac tracings I have personally interpreted today:  none  Pertinent  Radiological findings (summarize): none   Lab Results  Component Value Date   WBC 4.5 03/12/2023   HGB 14.7 03/12/2023   HCT 44.3 03/12/2023   PLT 238.0 03/12/2023   GLUCOSE 109 (H) 03/12/2023   CHOL 132 03/12/2023   TRIG 79.0 03/12/2023   HDL 51.50 03/12/2023   LDLDIRECT 89.0 04/20/2019   LDLCALC 65 03/12/2023   ALT 22 03/12/2023   AST 20 03/12/2023   NA 137 03/12/2023   K 4.1 03/12/2023   CL 105 03/12/2023   CREATININE 0.94 03/12/2023   BUN 11 03/12/2023   CO2 27 03/12/2023   TSH 1.48 08/19/2022   PSA 0.53 08/19/2022   HGBA1C 6.1 03/12/2023   Assessment/Plan:  DEYMAR NIVENS is a 58 y.o. Black or African American [2] male with  has a past medical history of Allergy, Anemia, Anxiety, Asthma, Cataract, Chronic kidney disease (2006), Clotting disorder (HCC) (2016), Complication of anesthesia, COVID-19 virus infection, Depression, DVT (deep venous thrombosis) (HCC), Fatty liver, Genital herpes (05/06/2018), H/O renal calculi (2006), Headache, History of kidney stones, History of methicillin resistant staphylococcus aureus (MRSA), Hyperlipidemia, Internal hemorrhoid, PE (pulmonary embolism), Peptic ulcer disease, Pneumonia, PTSD (post-traumatic stress disorder), Sleep apnea, Spondylosis, and Tubular adenoma of colon (2011).  RUQ pain C/w msk pain, exam benign, no further eval or tx needed,  to f/u any worsening symptoms or concerns  Hyperglycemia Lab Results  Component Value Date   HGBA1C 6.1 03/12/2023   Stable, pt to continue current medical treatment  - diet, wt control   Hyperlipidemia Lab Results  Component Value Date   LDLCALC 65 03/12/2023   Stable, pt to continue current statin lipitor 10 qd   Prediabetes Lab Results  Component Value Date   HGBA1C 6.1 03/12/2023   Stable, pt to continue current medical treatment  - diet wt control   Obesity Worsening, for nutrition counseling referral  Followup: Return if symptoms worsen or fail to improve.  Oliver Barre,  MD 05/27/2023 8:11 PM [facility]

## 2023-05-27 ENCOUNTER — Encounter: Payer: Self-pay | Admitting: Internal Medicine

## 2023-05-27 NOTE — Assessment & Plan Note (Signed)
Lab Results  Component Value Date   HGBA1C 6.1 03/12/2023   Stable, pt to continue current medical treatment  - diet wt control

## 2023-05-27 NOTE — Assessment & Plan Note (Signed)
Lab Results  Component Value Date   HGBA1C 6.1 03/12/2023   Stable, pt to continue current medical treatment  - diet, wt control

## 2023-05-27 NOTE — Assessment & Plan Note (Signed)
C/w msk pain, exam benign, no further eval or tx needed,  to f/u any worsening symptoms or concerns

## 2023-05-27 NOTE — Assessment & Plan Note (Signed)
Lab Results  Component Value Date   LDLCALC 65 03/12/2023   Stable, pt to continue current statin lipitor 10 qd

## 2023-05-27 NOTE — Assessment & Plan Note (Signed)
Worsening, for nutrition counseling referral
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
## 2023-06-09 ENCOUNTER — Ambulatory Visit: Payer: Medicare Other | Admitting: Neurology

## 2023-07-01 ENCOUNTER — Encounter: Payer: Self-pay | Admitting: Internal Medicine

## 2023-07-01 ENCOUNTER — Ambulatory Visit (INDEPENDENT_AMBULATORY_CARE_PROVIDER_SITE_OTHER): Payer: Medicare Other | Admitting: Internal Medicine

## 2023-07-01 VITALS — BP 122/70 | HR 75 | Temp 97.7°F | Ht 66.0 in | Wt 212.0 lb

## 2023-07-01 DIAGNOSIS — J019 Acute sinusitis, unspecified: Secondary | ICD-10-CM | POA: Diagnosis not present

## 2023-07-01 DIAGNOSIS — R7303 Prediabetes: Secondary | ICD-10-CM | POA: Diagnosis not present

## 2023-07-01 DIAGNOSIS — R42 Dizziness and giddiness: Secondary | ICD-10-CM

## 2023-07-01 DIAGNOSIS — J4530 Mild persistent asthma, uncomplicated: Secondary | ICD-10-CM | POA: Diagnosis not present

## 2023-07-01 DIAGNOSIS — E785 Hyperlipidemia, unspecified: Secondary | ICD-10-CM

## 2023-07-01 DIAGNOSIS — E663 Overweight: Secondary | ICD-10-CM | POA: Insufficient documentation

## 2023-07-01 DIAGNOSIS — H6691 Otitis media, unspecified, right ear: Secondary | ICD-10-CM | POA: Insufficient documentation

## 2023-07-01 MED ORDER — AZITHROMYCIN 250 MG PO TABS: ORAL_TABLET | ORAL | 1 refills | Status: AC

## 2023-07-01 MED ORDER — GUAIFENESIN ER 600 MG PO TB12: 1200.0000 mg | ORAL_TABLET | Freq: Two times a day (BID) | ORAL | 1 refills | Status: AC | PRN

## 2023-07-01 NOTE — Progress Notes (Signed)
 Patient ID: Anthony [name], male   DOB: 1966-03-24, 58 y.o.   MRN: [id number]        Chief Complaint: follow up sinusitis, asthma, preDM       HPI:  Anthony [name] is a 58 y.o. male  Here with 2-3 days acute onset fever, facial pain, pressure, headache, general weakness and malaise, and greenish d/c, with mild ST and cough, but pt denies chest pain, wheezing, increased sob or doe, orthopnea, PND, increased LE swelling, palpitations, dizziness or syncope.   Pt denies polydipsia, polyuria, or new focal neuro s/s except has mild intemittent vertigo in the past wk as well. .      Wt Readings from Last 3 Encounters:  07/01/23 212 lb (96.2 kg)  05/25/23 211 lb (95.7 kg)  05/12/23 216 lb 9.6 oz (98.2 kg)   BP Readings from Last 3 Encounters:  07/01/23 122/70  05/25/23 122/84  05/12/23 (!) 158/89         Past Medical History:  Diagnosis Date   Allergy    Past hx    Anemia    Anxiety    Asthma    Cataract    removed left- forming on the right  DVT and PE from long travel trip    Chronic kidney disease 2006   kidney stones    Clotting disorder (HCC) 2016   PE - small    Complication of anesthesia    HARD TO WAKE UP AFTER 2ND EYE SURGERY   COVID-19 virus infection    Depression    DVT (deep venous thrombosis) (HCC)    DUE TO A LONG TRAVEL   Fatty liver    Genital herpes 05/06/2018   H/O renal calculi 2006   Headache    MIGRAINES   History of kidney stones    H/O   History of methicillin resistant staphylococcus aureus (MRSA)    Hyperlipidemia    Internal hemorrhoid    PE (pulmonary embolism)    Peptic ulcer disease    Pneumonia    h/o   PTSD (post-traumatic stress disorder)    Sleep apnea    DOES NOT USE CPAP CURRENTLY-MASK DOES NOT FIT CORRECTLY [facility] ([facility]-13-20)   Spondylosis    Tubular adenoma of colon 2011   Dr Juanda Chance   Past Surgical History:  Procedure Laterality Date   CATARACT EXTRACTION Left 1996   Dr Chales Abrahams; OS   COLONOSCOPY      colonoscopy with polypectomy  2011   Dr Juanda Chance, hemorrhoids   CYSTOSCOPY W/ STONE MANIPULATION  2006   EYE SURGERY Left    INGUINAL HERNIA REPAIR Right 1990   POLYPECTOMY     SHOULDER SURGERY Right 12/17/2010   Dr Madelon Lips ; shoulder impingement & torn tendon   TRANSURETHRAL RESECTION OF PROSTATE N/A 02/23/2020   Procedure: TRANSURETHRAL RESECTION OF THE PROSTATE (TURP);  Surgeon: Sondra Come, MD;  Location: [facility] ORS;  Service: Urology;  Laterality: N/A;    reports that he has never smoked. He has never used smokeless tobacco. He reports current alcohol use. He reports that he does not use drugs. family history includes Cancer in his paternal grandmother; Dementia in his father and maternal aunt; Diabetes in his father; Esophageal cancer in his maternal uncle; Glaucoma in his father; Heart attack in his maternal uncle; Heart attack (age of onset: 26) in his mother; Heart attack (age of onset: 47) in his maternal grandmother; Stroke (age of onset: 68)  in his father. Allergies  Allergen Reactions   Aleve [Naproxen] Rash    Patient stated it was years ago   Nsaids Other (See Comments)    Bleeding ulcers   Current Outpatient Medications on File Prior to Visit  Medication Sig Dispense Refill   acetaminophen (TYLENOL) 500 MG tablet Take 1,000 mg by mouth every 6 (six) hours as needed for moderate pain.     albuterol (PROVENTIL HFA;VENTOLIN HFA) 108 (90 Base) MCG/ACT inhaler Inhale 2 puffs into the lungs every 6 (six) hours as needed. For shortness of breath 1 Inhaler 1   aspirin EC 81 MG tablet Take 81 mg by mouth daily.     atorvastatin (LIPITOR) 10 MG tablet Take 1 tablet (10 mg total) by mouth daily.     Cholecalciferol (VITAMIN D3 PO) Take 1 tablet by mouth daily.     Multiple Vitamin (MULTIVITAMIN) tablet Take 1 tablet by mouth daily.     sildenafil (VIAGRA) 100 MG tablet TAKE ONE TABLET BY MOUTH AS INSTRUCTED AS NEEDED ERECTILE DYSFUNCTION (TAKE 1 HOUR PRIOR TO SEXUAL ACTIVITY *DO NOT  EXCEED 1 DOSE PER 24 HOUR PERIOD*)     tadalafil (CIALIS) 20 MG tablet Take 0.5-1 tablets (10-20 mg total) by mouth daily as needed for erectile dysfunction (take 45 minutes prior to sexual activity). 30 tablet 6   valACYclovir (VALTREX) 1000 MG tablet Take 1/2 tablet by mouth twice daily for 3 days or 1 tablet by mouth every day for 5 days at onset of breakout. 90 tablet 1   [DISCONTINUED] omeprazole (PRILOSEC) 20 MG capsule Take 1 capsule (20 mg total) by mouth daily. 30 capsule 0   [DISCONTINUED] topiramate (TOPAMAX) 25 MG tablet topiramate 25 mg tablet (Patient not taking: No sig reported)     No current facility-administered medications on file prior to visit.        ROS:  All others reviewed and negative.  Objective        PE:  BP 122/70 (BP Location: Right Arm, Patient Position: Sitting, Cuff Size: Normal)   Pulse 75   Temp 97.7 F (36.5 C) (Oral)   Ht 5\' 6"  (1.676 m)   Wt 212 lb (96.2 kg)   SpO2 98%   BMI 34.22 kg/m                 Constitutional: Pt appears in NAD               HENT: Head: NCAT.                Right Ear: External ear normal.                 Left Ear: External ear normal. Bilat tm's with mild erythema.  Max sinus areas mild tender.  Pharynx with mild erythema, no exudate               Eyes: . Pupils are equal, round, and reactive to light. Conjunctivae and EOM are normal               Nose: without d/c or deformity               Neck: Neck supple. Gross normal ROM               Cardiovascular: Normal rate and regular rhythm.                 Pulmonary/Chest: Effort normal and breath sounds without rales or wheezing.  Abd:  Soft, NT, ND, + BS, no organomegaly               Neurological: Pt is alert. At baseline orientation, motor grossly intact               Skin: Skin is warm. No rashes, no other new lesions, LE edema - none               Psychiatric: Pt behavior is normal without agitation   Micro: none  Cardiac tracings I have personally  interpreted today:  none  Pertinent Radiological findings (summarize): none   Lab Results  Component Value Date   WBC 4.5 03/12/2023   HGB 14.7 03/12/2023   HCT 44.3 03/12/2023   PLT 238.0 03/12/2023   GLUCOSE 109 (H) 03/12/2023   CHOL 132 03/12/2023   TRIG 79.0 03/12/2023   HDL 51.50 03/12/2023   LDLDIRECT 89.0 04/20/2019   LDLCALC 65 03/12/2023   ALT 22 03/12/2023   AST 20 03/12/2023   NA 137 03/12/2023   K 4.1 03/12/2023   CL 105 03/12/2023   CREATININE 0.94 03/12/2023   BUN 11 03/12/2023   CO2 27 03/12/2023   TSH 1.48 08/19/2022   PSA 0.53 08/19/2022   HGBA1C 6.1 03/12/2023   Assessment/Plan:  DEAVIN FORST is a 58 y.o. Black or African American [2] male with  has a past medical history of Allergy, Anemia, Anxiety, Asthma, Cataract, Chronic kidney disease (2006), Clotting disorder (HCC) (2016), Complication of anesthesia, COVID-19 virus infection, Depression, DVT (deep venous thrombosis) (HCC), Fatty liver, Genital herpes (05/06/2018), H/O renal calculi (2006), Headache, History of kidney stones, History of methicillin resistant staphylococcus aureus (MRSA), Hyperlipidemia, Internal hemorrhoid, PE (pulmonary embolism), Peptic ulcer disease, Pneumonia, PTSD (post-traumatic stress disorder), Sleep apnea, Spondylosis, and Tubular adenoma of colon (2011).  Vertigo Mild intermittent for melcizine if tolerated  Acute sinusitis Mild to mod, for antibx course zpack,  to f/u any worsening symptoms or concerns  Asthma, mild persistent Stable overall, cont in haler prn  Prediabetes Lab Results  Component Value Date   HGBA1C 6.1 03/12/2023   Stable, pt to continue current medical treatment - diet, wt control   Hyperlipidemia Lab Results  Component Value Date   LDLCALC 65 03/12/2023   Stable, pt to continue current statin lipitor 10 qd  Followup: No follow-ups on file.  Oliver Barre, MD 07/04/2023 1:40 PM [facility]

## 2023-07-01 NOTE — Assessment & Plan Note (Signed)
 Mild intermittent for melcizine if tolerated

## 2023-07-04 DIAGNOSIS — J019 Acute sinusitis, unspecified: Secondary | ICD-10-CM | POA: Insufficient documentation

## 2023-07-04 NOTE — Assessment & Plan Note (Signed)
 Lab Results  Component Value Date   LDLCALC 65 03/12/2023   Stable, pt to continue current statin lipitor 10 qd

## 2023-07-04 NOTE — Assessment & Plan Note (Signed)
 Lab Results  Component Value Date   HGBA1C 6.1 03/12/2023   Stable, pt to continue current medical treatment  - diet, wt control

## 2023-07-04 NOTE — Patient Instructions (Signed)
Please take all new medication as prescribed  Please continue all other medications as before, and refills have been done if requested.  Please have the pharmacy call with any other refills you may need.  Please continue your efforts at being more active, low cholesterol diet, and weight control.  Please keep your appointments with your specialists as you may have planned    

## 2023-07-04 NOTE — Assessment & Plan Note (Signed)
Stable overall, cont inhaler prn 

## 2023-07-04 NOTE — Assessment & Plan Note (Signed)
Mild to mod, for antibx course zpack,  to f/u any worsening symptoms or concerns 

## 2023-07-26 ENCOUNTER — Encounter: Payer: Self-pay | Admitting: Internal Medicine

## 2023-07-26 DIAGNOSIS — K119 Disease of salivary gland, unspecified: Secondary | ICD-10-CM

## 2023-07-27 ENCOUNTER — Other Ambulatory Visit: Payer: Self-pay

## 2023-08-11 ENCOUNTER — Emergency Department
Admission: EM | Admit: 2023-08-11 | Discharge: 2023-08-11 | Disposition: A | Discharge status: Home / Self Care | Attending: Emergency Medicine | Admitting: Emergency Medicine

## 2023-08-11 ENCOUNTER — Other Ambulatory Visit: Payer: Self-pay

## 2023-08-11 DIAGNOSIS — W228XXA Striking against or struck by other objects, initial encounter: Secondary | ICD-10-CM | POA: Insufficient documentation

## 2023-08-11 DIAGNOSIS — S0181XA Laceration without foreign body of other part of head, initial encounter: Secondary | ICD-10-CM | POA: Diagnosis not present

## 2023-08-11 DIAGNOSIS — S0990XA Unspecified injury of head, initial encounter: Secondary | ICD-10-CM

## 2023-08-11 NOTE — ED Triage Notes (Signed)
 Pt was hit in the head with a shovel handle while working in the yard. Pt does not take any blood thinners. Pt denies HA, n/v.

## 2023-08-11 NOTE — ED Provider Notes (Signed)
 [facility] Provider Note    None    (approximate)   History   Head Injury    HPI  Anthony [name] is a 58 y.o. male , with no significant past medical history who presents to the ED complaining of head trauma after hitting himself in his head with a shovel handle while working in the yard today.  Patient is taking aspirin daily.  Patient denies headache, blurry vision.      Physical Exam   Triage Vital Signs: ED Triage Vitals  Encounter Vitals Group     BP 08/11/23 1842 (!) 118/97     Systolic BP Percentile --      Diastolic BP Percentile --      Pulse Rate 08/11/23 1842 100     Resp 08/11/23 1842 17     Temp 08/11/23 1840 97.9 F (36.6 C)     Temp Source 08/11/23 1840 Oral     SpO2 08/11/23 1842 99 %     Weight --      Height --      Head Circumference --      Peak Flow --      Pain Score 08/11/23 1842 0     Pain Loc --      Pain Education --      Exclude from Growth Chart --     Most recent vital signs: Vitals:   08/11/23 1840 08/11/23 1842  BP:  (!) 118/97  Pulse:  100  Resp:  17  Temp: 97.9 F (36.6 C)   SpO2:  99%     Constitutional: Alert, NAD. Able to speak in complete sentences without cough or dyspnea  Eyes: Conjunctivae are normal.  Head: A right temporal area presence of small laceration of 0.2 cm with no active bleeding Nose: No congestion/rhinnorhea. Mouth/Throat: Mucous membranes are moist.   Neck: Painless ROM. Supple. No JVD, nodes, thyromegaly  Cardiovascular:   Good peripheral circulation.RRR no murmurs, gallops, rubs  Respiratory: Normal respiratory effort.  No retractions. Clear to auscultation bilaterally without wheezing or crackles  Gastrointestinal: Soft and nontender.  Musculoskeletal:  no deformity Neurologic:  MAE spontaneously. No gross focal neurologic deficits are appreciated.  Skin:  Skin is warm, dry and intact. No rash noted. Psychiatric: Mood and affect are normal. Speech and behavior are  normal.    ED Results / Procedures / Treatments   Labs (all labs ordered are listed, but only abnormal results are displayed) Labs Reviewed - No data to display   EKG     RADIOLOGY     PROCEDURES:  Critical Care performed:   Procedures   MEDICATIONS ORDERED IN ED: Medications - No data to display    IMPRESSION / MDM / ASSESSMENT AND PLAN / ED COURSE  I reviewed the triage vital signs and the nursing notes.  Differential diagnosis includes, but is not limited to, headache, fracture, hemorrhage  Patient's presentation is most consistent with acute, uncomplicated illness.   Patient's diagnosis is consistent with minor head trauma.  physical exam is reassuring. I did review the patient's allergies and medications.The patient is in stable and satisfactory condition for discharge home  Patient will be discharged home without prescriptions . Patient is to follow up with PCP as needed or otherwise directed. Patient is given ED precautions to return to the ED for any worsening or new symptoms. Discussed plan of care with patient, answered all of patient's questions, Patient agreeable to plan of care. . Patient  verbalized understanding.    FINAL CLINICAL IMPRESSION(S) / ED DIAGNOSES   Final diagnoses:  Minor head injury, initial encounter     Rx / DC Orders   ED Discharge Orders     None        Note:  This document was prepared using Dragon voice recognition software and may include unintentional dictation errors.   Gladys Damme, PA-C 08/11/23 1851    Claybon Jabs, MD 08/12/23 [phone]

## 2023-08-11 NOTE — ED Provider Triage Note (Signed)
 Emergency Medicine Provider Triage Evaluation Note  ZAY YEARGAN , a 58 y.o. male  was evaluated in triage.  Pt complains of head trauma.  Patient states he hit his head without shovel handle while working in the yard.  Denies loss of consciousness or headache.  Patient is taking aspirin daily  Review of Systems  Positive:  Negative:   Physical Exam  BP (!) 118/97   Pulse 100   Temp 97.9 F (36.6 C) (Oral)   Resp 17   SpO2 99%  Gen:   Awake, no distress   Resp:  Normal effort MSK:   Moves extremities without difficulty  Other:  Head: 0.3 cm laceration in the right temporal area no active bleeding at the moment  Medical Decision Making  Medically screening exam initiated at 6:43 PM.  Appropriate orders placed.  TARRANCE JANUSZEWSKI was informed that the remainder of the evaluation will be completed by another provider, this initial triage assessment does not replace that evaluation, and the importance of remaining in the ED until their evaluation is complete.  Patient with head trauma without loss of consciousness.   Gladys Damme, PA-C 08/11/23 1845

## 2023-08-11 NOTE — Discharge Instructions (Addendum)
 Have been diagnosed with minor head injury.  If you have a headache please take acetaminophen every 6 hours.  Please come back to ED or go to your PCP if you have new symptoms or symptoms worsen.

## 2023-08-18 ENCOUNTER — Encounter: Payer: Self-pay | Admitting: Internal Medicine

## 2023-08-18 DIAGNOSIS — G5601 Carpal tunnel syndrome, right upper limb: Secondary | ICD-10-CM

## 2023-09-03 ENCOUNTER — Other Ambulatory Visit: Payer: Self-pay

## 2023-09-03 ENCOUNTER — Emergency Department
Admission: EM | Admit: 2023-09-03 | Discharge: 2023-09-03 | Disposition: A | Discharge status: Home / Self Care | Attending: Emergency Medicine | Admitting: Emergency Medicine

## 2023-09-03 ENCOUNTER — Emergency Department

## 2023-09-03 DIAGNOSIS — M79605 Pain in left leg: Secondary | ICD-10-CM | POA: Insufficient documentation

## 2023-09-03 NOTE — ED Triage Notes (Signed)
 Pt sts that he is here to get check for a DVT. Pt sts that he came back from [place]  on Monday and started to have pain in the lower left leg. No swelling or redness present.

## 2023-09-03 NOTE — ED Notes (Signed)
 Pt in [place]  and c/o right leg pain also. New orders updated at this time.

## 2023-09-03 NOTE — ED Provider Notes (Cosign Needed Addendum)
 [facility] Provider Note    Event Date/Time   First MD Initiated Contact with Patient 09/03/23 1548     (approximate)   History   Leg Pain    HPI  Anthony [name] is a 58 y.o. male     with a past medical history of BPPV, sinusitis, obstructive sleep apnea,  who presents to the ED complaining of    According to the patient, he recently was driving to [place] , he was stopping every 2 hours to go for a walk.  Patient is concerned about having a DVT because he has a previous DVT on DVT in 2016.  She is taking baby aspirin every day.  Patient states he is going to have follow-up appointment with her his PCP in a week.      Physical Exam   Triage Vital Signs: ED Triage Vitals  Encounter Vitals Group     BP 09/03/23 1352 (!) 129/100     Systolic BP Percentile --      Diastolic BP Percentile --      Pulse Rate 09/03/23 1352 (!) 127     Resp 09/03/23 1352 18     Temp 09/03/23 1352 98.4 F (36.9 C)     Temp Source 09/03/23 1352 Oral     SpO2 09/03/23 1352 97 %     Weight 09/03/23 1353 208 lb (94.3 kg)     Height 09/03/23 1547 5\' 6"  (1.676 m)     Head Circumference --      Peak Flow --      Pain Score 09/03/23 1353 0     Pain Loc --      Pain Education --      Exclude from Growth Chart --     Most recent vital signs: Vitals:   09/03/23 1352 09/03/23 1621  BP: (!) 129/100 120/88  Pulse: (!) 127 99  Resp: 18 18  Temp: 98.4 F (36.9 C)   SpO2: 97% 97%     Constitutional: Alert, NAD. Able to speak in complete sentences without cough or dyspnea  Eyes: Conjunctivae are normal.  Head: Atraumatic. Nose: No congestion/rhinnorhea. Mouth/Throat: Mucous membranes are moist.   Neck: Painless ROM. Supple. No JVD, nodes, thyromegaly  Cardiovascular:   Good peripheral circulation.RRR no murmurs, gallops, rubs  Respiratory: Normal respiratory effort.  No retractions. Clear to auscultation bilaterally without wheezing or crackles  Gastrointestinal: Soft  and nontender.  Musculoskeletal:  no deformity Left leg: Skin is intact, no discoloration, not tender to palpation, no pitting edema Neurologic:  MAE spontaneously. No gross focal neurologic deficits are appreciated.  Skin:  Skin is warm, dry and intact. No rash noted. Psychiatric: Mood and affect are normal. Speech and behavior are normal.    ED Results / Procedures / Treatments   Labs (all labs ordered are listed, but only abnormal results are displayed) Labs Reviewed - No data to display   EKG     RADIOLOGY I independently reviewed and interpreted imaging and agree with radiologists findings.      PROCEDURES:  Critical Care performed:   Procedures   MEDICATIONS ORDERED IN ED: Medications - No data to display Clinical Course as of 09/03/23 1701  Fri Sep 03, 2023  1635 US  Venous Img Lower Bilateral (DVT) No evidence of bilateral lower extremity DVT. [AE]    Clinical Course User Index [AE] Awilda Lennox, PA-C    IMPRESSION / MDM / ASSESSMENT AND PLAN / ED COURSE  I reviewed the  triage vital signs and the nursing notes.  Differential diagnosis includes, but is not limited to, DVT, muscle strain, thrombophlebitis  Patient's presentation is most consistent with acute complicated illness / injury requiring diagnostic workup.  Patient's diagnosis is consistent with left leg muscle strain. I independently reviewed and interpreted imaging and agree with radiologists findings and ruling  out DVT. I did review the patient's allergies and medications.The patient is in stable and satisfactory condition for discharge home  Patient will be discharged home without prescriptions. Patient is to follow up with PCP as needed or otherwise directed. Patient is given ED precautions to return to the ED for any worsening or new symptoms. Discussed plan of care with patient, answered all of patient's questions, Patient agreeable to plan of care.  Advised patient to use compression  socks when he is driving, and to exercise with his legs when he is unable to walk, patient verbalized understanding.    FINAL CLINICAL IMPRESSION(S) / ED DIAGNOSES   Final diagnoses:  Left leg pain     Rx / DC Orders   ED Discharge Orders     None        Note:  This document was prepared using Dragon voice recognition software and may include unintentional dictation errors.   Awilda Lennox, PA-C 09/03/23 1802    Awilda Lennox, PA-C 09/03/23 1803    Jacquie Maudlin, MD 09/04/23 2006

## 2023-09-03 NOTE — ED Notes (Signed)
 See triage note  Presents with pain to left lower leg  States pain is mainly posterior Min swelling noted

## 2023-09-03 NOTE — Discharge Instructions (Signed)
 You have been diagnosed with left leg pain, ultrasound rule out DVT.  Wear your compression socks when you are driving or flying.  Please come back to ED or go to your PCP if you have new symptoms or symptoms worsen.  Please go to your appointment next week for a follow-up with your PCP for an annual checkup.

## 2023-09-06 ENCOUNTER — Telehealth: Payer: Self-pay

## 2023-09-06 NOTE — Transitions of Care (Post Inpatient/ED Visit) (Signed)
   09/06/2023  Name: Anthony [name] MRN: [id number] DOB: 1966/05/02  Today's TOC FU Call Status: Today's TOC FU Call Status:: Unsuccessful Call (1st Attempt) Unsuccessful Call (1st Attempt) Date: 09/06/23  Attempted to reach the patient regarding the most recent Inpatient/ED visit.  Follow Up Plan: Additional outreach attempts will be made to reach the patient to complete the Transitions of Care (Post Inpatient/ED visit) call.   Signature : Devon Fogo, CMA

## 2023-09-09 ENCOUNTER — Encounter: Payer: Self-pay | Admitting: Internal Medicine

## 2023-09-09 ENCOUNTER — Ambulatory Visit (INDEPENDENT_AMBULATORY_CARE_PROVIDER_SITE_OTHER): Payer: Medicare Other | Admitting: Internal Medicine

## 2023-09-09 VITALS — BP 132/80 | HR 65 | Temp 97.7°F | Ht 66.0 in | Wt 212.0 lb

## 2023-09-09 DIAGNOSIS — E785 Hyperlipidemia, unspecified: Secondary | ICD-10-CM

## 2023-09-09 DIAGNOSIS — G5601 Carpal tunnel syndrome, right upper limb: Secondary | ICD-10-CM | POA: Diagnosis not present

## 2023-09-09 DIAGNOSIS — Z961 Presence of intraocular lens: Secondary | ICD-10-CM | POA: Insufficient documentation

## 2023-09-09 DIAGNOSIS — E559 Vitamin D deficiency, unspecified: Secondary | ICD-10-CM

## 2023-09-09 DIAGNOSIS — R972 Elevated prostate specific antigen [PSA]: Secondary | ICD-10-CM | POA: Diagnosis not present

## 2023-09-09 DIAGNOSIS — R7303 Prediabetes: Secondary | ICD-10-CM | POA: Diagnosis not present

## 2023-09-09 DIAGNOSIS — A6000 Herpesviral infection of urogenital system, unspecified: Secondary | ICD-10-CM

## 2023-09-09 LAB — CBC WITH DIFFERENTIAL/PLATELET
Basophils Absolute: 0.1 10*3/uL (ref 0.0–0.1)
Basophils Relative: 1.1 % (ref 0.0–3.0)
Eosinophils Absolute: 0.6 10*3/uL (ref 0.0–0.7)
Eosinophils Relative: 11.6 % — ABNORMAL HIGH (ref 0.0–5.0)
HCT: 47.4 % (ref 39.0–52.0)
Hemoglobin: 15.8 g/dL (ref 13.0–17.0)
Lymphocytes Relative: 30.4 % (ref 12.0–46.0)
Lymphs Abs: 1.5 10*3/uL (ref 0.7–4.0)
MCHC: 33.3 g/dL (ref 30.0–36.0)
MCV: 86.7 fl (ref 78.0–100.0)
Monocytes Absolute: 0.4 10*3/uL (ref 0.1–1.0)
Monocytes Relative: 8.8 % (ref 3.0–12.0)
Neutro Abs: 2.4 10*3/uL (ref 1.4–7.7)
Neutrophils Relative %: 48.1 % (ref 43.0–77.0)
Platelets: 218 10*3/uL (ref 150.0–400.0)
RBC: 5.46 Mil/uL (ref 4.22–5.81)
RDW: 15.8 % — ABNORMAL HIGH (ref 11.5–15.5)
WBC: 4.9 10*3/uL (ref 4.0–10.5)

## 2023-09-09 LAB — LIPID PANEL
Cholesterol: 185 mg/dL (ref 0–200)
HDL: 52.6 mg/dL (ref >39.00)
LDL Cholesterol: 108 mg/dL — ABNORMAL HIGH (ref 0–99)
NonHDL: 132.42
Total CHOL/HDL Ratio: 4
Triglycerides: 120 mg/dL (ref 0.0–149.0)
VLDL: 24 mg/dL (ref 0.0–40.0)

## 2023-09-09 LAB — PSA: PSA: 0.82 ng/mL (ref 0.10–4.00)

## 2023-09-09 LAB — BASIC METABOLIC PANEL WITH GFR
BUN: 12 mg/dL (ref 6–23)
CO2: 27 meq/L (ref 19–32)
Calcium: 9.5 mg/dL (ref 8.4–10.5)
Chloride: 105 meq/L (ref 96–112)
Creatinine, Ser: 0.93 mg/dL (ref 0.40–1.50)
GFR: 91.71 mL/min (ref >60.00)
Glucose, Bld: 97 mg/dL (ref 70–99)
Potassium: 4.4 meq/L (ref 3.5–5.1)
Sodium: 138 meq/L (ref 135–145)

## 2023-09-09 LAB — URINALYSIS, ROUTINE W REFLEX MICROSCOPIC
Bilirubin Urine: NEGATIVE
Ketones, ur: NEGATIVE
Leukocytes,Ua: NEGATIVE
Nitrite: NEGATIVE
Specific Gravity, Urine: 1.015 (ref 1.000–1.030)
Total Protein, Urine: NEGATIVE
Urine Glucose: NEGATIVE
Urobilinogen, UA: 0.2 (ref 0.0–1.0)
pH: 6.5 (ref 5.0–8.0)

## 2023-09-09 LAB — VITAMIN D 25 HYDROXY (VIT D DEFICIENCY, FRACTURES): VITD: 39.25 ng/mL (ref 30.00–100.00)

## 2023-09-09 LAB — HEPATIC FUNCTION PANEL
ALT: 20 U/L (ref 0–53)
AST: 18 U/L (ref 0–37)
Albumin: 4.6 g/dL (ref 3.5–5.2)
Alkaline Phosphatase: 83 U/L (ref 39–117)
Bilirubin, Direct: 0.1 mg/dL (ref 0.0–0.3)
Total Bilirubin: 0.8 mg/dL (ref 0.2–1.2)
Total Protein: 7.3 g/dL (ref 6.0–8.3)

## 2023-09-09 LAB — TSH: TSH: 1.14 u[IU]/mL (ref 0.35–5.50)

## 2023-09-09 LAB — HEMOGLOBIN A1C: Hgb A1c MFr Bld: 6.2 % (ref 4.6–6.5)

## 2023-09-09 NOTE — Progress Notes (Signed)
 Patient ID: Anthony [name], male   DOB: 15-Mar-1966, 58 y.o.   MRN: [id number]        Chief Complaint: follow up hld, elevated psa, preDM, right carpal tunel syndrome, low vit d       HPI:  Anthony [name] is a 58 y.o. male here with c/o midl to mod worsening right hand numbness and neuritic pains, without weakness for several months.  Pt denies chest pain, increased sob or doe, wheezing, orthopnea, PND, increased LE swelling, palpitations, dizziness or syncope.   Pt denies polydipsia, polyuria, or new focal neuro s/s.    Pt denies fever, wt loss, night sweats, loss of appetite, or other constitutional symptoms         Wt Readings from Last 3 Encounters:  09/09/23 212 lb (96.2 kg)  09/03/23 207 lb 14.3 oz (94.3 kg)  07/01/23 212 lb (96.2 kg)   BP Readings from Last 3 Encounters:  09/09/23 132/80  09/03/23 120/88  07/01/23 122/70         Past Medical History:  Diagnosis Date   Allergy     Past hx    Anemia    Anxiety    Asthma    Cataract    removed left- forming on the right  DVT and PE from long travel trip    Chronic kidney disease 2006   kidney stones    Clotting disorder (HCC) 2016   PE - small    Complication of anesthesia    HARD TO WAKE UP AFTER 2ND EYE SURGERY   COVID-19 virus infection    Depression    DVT (deep venous thrombosis) (HCC)    DUE TO A LONG TRAVEL   Fatty liver    Genital herpes 05/06/2018   H/O renal calculi 2006   Headache    MIGRAINES   History of kidney stones    H/O   History of methicillin resistant staphylococcus aureus (MRSA)    Hyperlipidemia    Internal hemorrhoid    PE (pulmonary embolism)    Peptic ulcer disease    Pneumonia    h/o   PTSD (post-traumatic stress disorder)    Sleep apnea    DOES NOT USE CPAP CURRENTLY-MASK DOES NOT FIT CORRECTLY SO [facility]13[facility]20)   Spondylosis    Tubular adenoma of colon 2011   Dr Grandville Lax   Past Surgical History:  Procedure Laterality Date   CATARACT EXTRACTION Left 1996    Dr Zackary Heron; OS   COLONOSCOPY     colonoscopy with polypectomy  2011   Dr Grandville Lax, hemorrhoids   CYSTOSCOPY W/ STONE MANIPULATION  2006   EYE SURGERY Left    INGUINAL HERNIA REPAIR Right 1990   POLYPECTOMY     SHOULDER SURGERY Right 12/17/2010   Dr Marland Silvas ; shoulder impingement & torn tendon   TRANSURETHRAL RESECTION OF PROSTATE N/A 02/23/2020   Procedure: TRANSURETHRAL RESECTION OF THE PROSTATE (TURP);  Surgeon: Lawerence Pressman, MD;  Location: [facility] ORS;  Service: Urology;  Laterality: N/A;    reports that he has never smoked. He has never used smokeless tobacco. He reports current alcohol use. He reports that he does not use drugs. family history includes Cancer in his paternal grandmother; Dementia in his father and maternal aunt; Diabetes in his father; Esophageal cancer in his maternal uncle; Glaucoma in his father; Heart attack in his maternal uncle; Heart attack (age of onset: 3) in his mother; Heart attack (age of onset: 60)  in his maternal grandmother; Stroke (age of onset: 60) in his father. Allergies  Allergen Reactions   Aleve [Naproxen] Rash    Patient stated it was years ago   Nsaids Other (See Comments)    Bleeding ulcers   Current Outpatient Medications on File Prior to Visit  Medication Sig Dispense Refill   acetaminophen  (TYLENOL ) 500 MG tablet Take 1,000 mg by mouth every 6 (six) hours as needed for moderate pain.     albuterol  (PROVENTIL  HFA;VENTOLIN  HFA) 108 (90 Base) MCG/ACT inhaler Inhale 2 puffs into the lungs every 6 (six) hours as needed. For shortness of breath 1 Inhaler 1   aspirin EC 81 MG tablet Take 81 mg by mouth daily.     atorvastatin  (LIPITOR) 10 MG tablet Take 1 tablet (10 mg total) by mouth daily.     Brinzolamide-Brimonidine 1-0.2 % SUSP Apply to eye.     busPIRone  (BUSPAR ) 15 MG tablet Take 15 mg by mouth.     cetirizine  (ZYRTEC ) 10 MG tablet Take 10 mg by mouth.     Cholecalciferol (VITAMIN D3 PO) Take 1 tablet by mouth daily.     fluticasone   (FLONASE ) 50 MCG/ACT nasal spray Place into the nose.     fluticasone -salmeterol (ADVAIR) 250-50 MCG/ACT AEPB Take by mouth.     guaiFENesin  (MUCINEX ) 600 MG 12 hr tablet Take 2 tablets (1,200 mg total) by mouth 2 (two) times daily as needed. 60 tablet 1   HYDROcodone -acetaminophen  (NORCO/VICODIN) 5-325 MG tablet Take 1 tablet by mouth every 6 (six) hours as needed.     hydrocortisone 2.5 % cream Apply topically.     ketoconazole (NIZORAL) 2 % cream Apply topically.     Multiple Vitamin (MULTIVITAMIN) tablet Take 1 tablet by mouth daily.     OXcarbazepine (TRILEPTAL) 150 MG tablet Take 150 mg by mouth.     prednisoLONE acetate (PRED FORTE) 1 % ophthalmic suspension Apply 1 drop to eye.     sertraline (ZOLOFT) 50 MG tablet Take 50 mg by mouth.     sildenafil (VIAGRA) 100 MG tablet TAKE ONE TABLET BY MOUTH AS INSTRUCTED AS NEEDED ERECTILE DYSFUNCTION (TAKE 1 HOUR PRIOR TO SEXUAL ACTIVITY *DO NOT EXCEED 1 DOSE PER 24 HOUR PERIOD*)     tadalafil  (CIALIS ) 20 MG tablet Take 0.5-1 tablets (10-20 mg total) by mouth daily as needed for erectile dysfunction (take 45 minutes prior to sexual activity). 30 tablet 6   valACYclovir  (VALTREX ) 1000 MG tablet Take 1/2 tablet by mouth twice daily for 3 days or 1 tablet by mouth every day for 5 days at onset of breakout. 90 tablet 1   [DISCONTINUED] omeprazole  (PRILOSEC) 20 MG capsule Take 1 capsule (20 mg total) by mouth daily. 30 capsule 0   [DISCONTINUED] topiramate (TOPAMAX) 25 MG tablet topiramate 25 mg tablet (Patient not taking: No sig reported)     No current facility-administered medications on file prior to visit.        ROS:  All others reviewed and negative.  Objective        PE:  BP 132/80 (BP Location: Right Arm, Patient Position: Sitting, Cuff Size: Normal)   Pulse 65   Temp 97.7 F (36.5 C) (Oral)   Ht 5\' 6"  (1.676 m)   Wt 212 lb (96.2 kg)   SpO2 97%   BMI 34.22 kg/m                 Constitutional: Pt appears in NAD  HENT:  Head: NCAT.                Right Ear: External ear normal.                 Left Ear: External ear normal.                Eyes: . Pupils are equal, round, and reactive to light. Conjunctivae and EOM are normal               Nose: without d/c or deformity               Neck: Neck supple. Gross normal ROM               Cardiovascular: Normal rate and regular rhythm.                 Pulmonary/Chest: Effort normal and breath sounds without rales or wheezing.                Abd:  Soft, NT, ND, + BS, no organomegaly               Neurological: Pt is alert. At baseline orientation, motor grossly intact               Skin: Skin is warm. No rashes, no other new lesions, LE edema - none               Psychiatric: Pt behavior is normal without agitation   Micro: none  Cardiac tracings I have personally interpreted today:  none  Pertinent Radiological findings (summarize): none   Lab Results  Component Value Date   WBC 4.9 09/09/2023   HGB 15.8 09/09/2023   HCT 47.4 09/09/2023   PLT 218.0 09/09/2023   GLUCOSE 97 09/09/2023   CHOL 185 09/09/2023   TRIG 120.0 09/09/2023   HDL 52.60 09/09/2023   LDLDIRECT 89.0 04/20/2019   LDLCALC 108 (H) 09/09/2023   ALT 20 09/09/2023   AST 18 09/09/2023   NA 138 09/09/2023   K 4.4 09/09/2023   CL 105 09/09/2023   CREATININE 0.93 09/09/2023   BUN 12 09/09/2023   CO2 27 09/09/2023   TSH 1.14 09/09/2023   PSA 0.82 09/09/2023   HGBA1C 6.2 09/09/2023   Assessment/Plan:  Anthony [name] is a 59 y.o. Black or African American [2] male with  has a past medical history of Allergy , Anemia, Anxiety, Asthma, Cataract, Chronic kidney disease (2006), Clotting disorder (HCC) (2016), Complication of anesthesia, COVID-19 virus infection, Depression, DVT (deep venous thrombosis) (HCC), Fatty liver, Genital herpes (05/06/2018), H/O renal calculi (2006), Headache, History of kidney stones, History of methicillin resistant staphylococcus aureus (MRSA), Hyperlipidemia, Internal  hemorrhoid, PE (pulmonary embolism), Peptic ulcer disease, Pneumonia, PTSD (post-traumatic stress disorder), Sleep apnea, Spondylosis, and Tubular adenoma of colon (2011).  Hyperlipidemia Lab Results  Component Value Date   LDLCALC 108 (H) 09/09/2023   uncontrolled, pt to continue current statin lipitor 10 mg every day and lower chol diet,, declines other change for now   Elevated PSA Lab Results  Component Value Date   PSA1 0.6 09/04/2021   PSA 0.82 09/09/2023   PSA 0.53 08/19/2022   PSA 0.46 06/26/2021   Stable, cont current tx  Prediabetes Lab Results  Component Value Date   HGBA1C 6.2 09/09/2023   Stable, pt to continue current medical treatment  - diet, wt control   Right carpal tunnel syndrome Mild to mod, for right wrist splint at bedtime  prn, f/u hand surgury as planned  Vitamin D  deficiency Last vitamin D  Lab Results  Component Value Date   VD25OH 39.25 09/09/2023   Low, to start oral replacement  Followup: Return in about 6 months (around 03/11/2024).  Rosalia Colonel, MD 09/11/2023 9:27 PM [facility]

## 2023-09-09 NOTE — Progress Notes (Signed)
 The test results show that your current treatment is OK, as the tests are stable.  Please continue the same plan.  There is no other need for change of treatment or further evaluation based on these results, at this time.  thanks

## 2023-09-09 NOTE — Patient Instructions (Signed)
 Ok to use the Wrist Splint for the right wrist only at night  Please continue all other medications as before, and refills have been done if requested.  Please have the pharmacy call with any other refills you may need.  Please keep your appointments with your specialists as you may have planned - hand surgury  Please go to the LAB at the blood drawing area for the tests to be done  You will be contacted by phone if any changes need to be made immediately.  Otherwise, you will receive a letter about your results with an explanation, but please check with MyChart first.  Please make an Appointment to return in 6 months, or sooner if needed

## 2023-09-11 ENCOUNTER — Encounter: Payer: Self-pay | Admitting: Internal Medicine

## 2023-09-11 DIAGNOSIS — E559 Vitamin D deficiency, unspecified: Secondary | ICD-10-CM | POA: Insufficient documentation

## 2023-09-11 NOTE — Assessment & Plan Note (Signed)
 Lab Results  Component Value Date   HGBA1C 6.2 09/09/2023   Stable, pt to continue current medical treatment  - diet, wt control

## 2023-09-11 NOTE — Assessment & Plan Note (Signed)
 Last vitamin D  Lab Results  Component Value Date   VD25OH 39.25 09/09/2023   Low, to start oral replacement

## 2023-09-11 NOTE — Assessment & Plan Note (Signed)
 Lab Results  Component Value Date   PSA1 0.6 09/04/2021   PSA 0.82 09/09/2023   PSA 0.53 08/19/2022   PSA 0.46 06/26/2021   Stable, cont current tx

## 2023-09-11 NOTE — Assessment & Plan Note (Signed)
 Lab Results  Component Value Date   LDLCALC 108 (H) 09/09/2023   uncontrolled, pt to continue current statin lipitor 10 mg every day and lower chol diet,, declines other change for now

## 2023-09-11 NOTE — Assessment & Plan Note (Signed)
 Mild to mod, for right wrist splint at bedtime prn, f/u hand surgury as planned

## 2023-09-16 ENCOUNTER — Ambulatory Visit: Payer: Federal, State, Local not specified - PPO | Admitting: Urology

## 2023-09-20 ENCOUNTER — Ambulatory Visit (INDEPENDENT_AMBULATORY_CARE_PROVIDER_SITE_OTHER): Admitting: Orthopedic Surgery

## 2023-09-20 ENCOUNTER — Other Ambulatory Visit: Payer: Self-pay

## 2023-09-20 DIAGNOSIS — R202 Paresthesia of skin: Secondary | ICD-10-CM

## 2023-09-20 DIAGNOSIS — R200 Anesthesia of skin: Secondary | ICD-10-CM | POA: Diagnosis not present

## 2023-09-20 DIAGNOSIS — M25531 Pain in right wrist: Secondary | ICD-10-CM | POA: Diagnosis not present

## 2023-09-20 NOTE — Progress Notes (Signed)
 Anthony [name] - 58 y.o. male MRN [id number]  Date of birth: [birth year]  Office Visit Note: Visit Date: 09/20/2023 PCP: Roslyn Coombe, MD Referred by: Roslyn Coombe, MD  Subjective: No chief complaint on file.  HPI: Anthony [name] is a pleasant 58 y.o. male who presents today for evaluation of right hand numbness and tingling of the radial sided digits that initially began after using a vibratory leaf blower excessively a few months prior.  He states that he has since changed the equipment he is using, and has noticed a significant resolution in his symptoms.  Did not have any prior numbness and tingling.  Denies any significant nocturnal symptoms.  No other treatments have been tried to date.  He is seeing me today for specific hand surgical evaluation.  Pertinent ROS were reviewed with the patient and found to be negative unless otherwise specified above in HPI.   Visit Reason: right hand Duration of symptoms: 07/13/23 Hand dominance: right Occupation:retired Diabetic: No Smoking: No Heart/Lung History:none Blood Thinners: none   Prior Testing/EMG: none Injections (Date):none Treatments: none Prior Surgery:none  Assessment & Plan: Visit Diagnoses:  1. Numbness and tingling in right hand   2. Pain in right wrist     Plan: Extensive discussion was had with patient today regarding his right hand complaints.  He does appear that the vibratory mechanism created by the leaf blower was causing some underlying carpal tunnel type syndrome in the hand.  I am pleased to see that this has resolved with simple activity modification which he has already done on his own.  I did explain to him the underlying etiology and pathophysiology of carpal tunnel syndrome as well as its potential for progression or recurrence.  I did explain that should he develop potential nocturnal symptoms in the future or recurrent symptoms, he could always trial wrist bracing.  He stated he did not have a wrist  brace, wrist brace was provided to him today.  He is welcome to follow-up with me as needed in the future.  He expressed full understanding.  Follow-up: No follow-ups on file.   Meds & Orders: No orders of the defined types were placed in this encounter.   Orders Placed This Encounter  Procedures   XR Wrist Complete Right     Procedures: No procedures performed      Clinical History: No specialty comments available.  He reports that he has never smoked. He has never used smokeless tobacco.  Recent Labs    03/12/23 0942 09/09/23 0954  HGBA1C 6.1 6.2    Objective:   Vital Signs: There were no vitals taken for this visit.  Physical Exam  Gen: Well-appearing, in no acute distress; non-toxic CV: Regular Rate. Well-perfused. Warm.  Resp: Breathing unlabored on room air; no wheezing. Psych: Fluid speech in conversation; appropriate affect; normal thought process  Ortho Exam PHYSICAL EXAM:  General: Patient is well appearing and in no distress.   Skin and Muscle: No skin changes are apparent to upper extremities.   Range of Motion and Palpation Tests: Mobility is full about the elbows with flexion and extension.  Forearm supination and pronation are 85/85 bilaterally.  Wrist flexion/extension is 75/65 bilaterally.  Digital flexion and extension are full.  Thumb opposition is full to the base of the small fingers bilaterally.    No cords or nodules are palpated.  No triggering is observed.     Neurologic, Vascular, Motor: Sensation is intact to light touch  in the bilateral median/radial/ulnar distribution.    Thenar atrophy: Negative bilaterally Tinel sign: Negative bilateral carpal tunnel and cubital tunnel  Motor bilateral hand FPL: 5/5 Index FDP: 5/5 APB: 5/5  Fingers pink and well perfused.  Capillary refill is brisk.     Lab Results  Component Value Date   HGBA1C 6.2 09/09/2023     Imaging: No results found.  Past Medical/Family/Surgical/Social  History: Medications & Allergies reviewed per EMR, new medications updated. Patient Active Problem List   Diagnosis Date Noted   Vitamin D  deficiency 09/11/2023   Pseudophakia of left eye 09/09/2023   Right carpal tunnel syndrome 09/09/2023   Acute sinusitis 07/04/2023   Overweight 07/01/2023   Right otitis media 07/01/2023   Palpitations 03/12/2023   Prediabetes 03/12/2023   RUQ pain 03/12/2023   COVID-19 virus infection 11/15/2022   Vertigo 11/15/2022   Artificial lens present 08/18/2022   Age-related cataract of right eye 08/18/2022   Flat foot (pes planus) (acquired), unspecified foot 08/18/2022   Hypertrophic disorder of the skin, unspecified 08/18/2022   Major depressive disorder, recurrent, moderate (HCC) 08/18/2022   Ocular hypertension 08/18/2022   Erectile dysfunction 08/18/2022   Presbyopia 02/23/2022   Cervical radiculopathy 02/10/2022   Bilateral posterior neck pain 01/17/2022   Tachycardia 10/11/2021   Dental caries on smooth surface penetrating into dentin 10/09/2021   Tachycardia, unspecified 10/09/2021   Chronic chest wall pain 06/26/2021   Ocular hypertension, 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   Unspecified disorder of refraction 05/09/2021   Bilateral impacted cerumen 04/26/2021   Cellulitis of left elbow 11/19/2020   Allergic rhinitis 10/15/2020   Brow ptosis, left 10/15/2020   Bunion of right foot 10/15/2020   Contact with and (suspected) exposure to covid-19 10/15/2020   Deposits (accretions) on teeth 10/15/2020   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, left eye, stage unspecified 10/15/2020   Hematuria, unspecified 10/15/2020   Inflammatory disease of prostate, unspecified 10/15/2020   Male erectile disorder (CODE) 10/15/2020   Male erectile dysfunction, unspecified 10/15/2020   Nondependent alcohol abuse, continuous drinking behavior 10/15/2020   Obesity 10/15/2020   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   Unspecified ptosis of left eyelid 10/15/2020   Unspecified  symptoms and signs involving cognitive functions and awareness 10/15/2020   Other recurrent depressive disorders (HCC) 10/15/2020   Post-traumatic stress disorder, unspecified 10/15/2020   Adjustment disorder, unspecified 10/15/2020   Encounter for dental examination and cleaning without abnormal findings 10/15/2020   Recurrent major depression (HCC) 10/15/2020   Major depressive disorder, recurrent, unspecified (HCC) 10/15/2020   Mood disorder (HCC) 10/15/2020   Dietary counseling and surveillance 10/15/2020   Other specified counseling 10/15/2020   Headache, unspecified 10/15/2020   Herpes simplex 10/15/2020   Migraine with aura, intractable, with status migrainosus 10/15/2020   Migraine without aura, not intractable, with status migrainosus 10/15/2020   Low back pain, unspecified 10/15/2020   Pain in left leg 10/15/2020   Other asthma 10/15/2020   Meralgia paresthetica of left side 07/03/2020   Major depressive disorder, single episode, unspecified 06/25/2020   Leg cramping 09/04/2019   Pain in joint of right elbow 08/03/2019   Lateral epicondylitis of right elbow 08/03/2019   Nephrolithiasis 05/11/2019   Epigastric pain 05/11/2019   Other constipation 05/11/2019   Right sided abdominal pain 05/11/2019   Paresthesia 04/20/2019   Chronic post-traumatic stress disorder 01/14/2019   Left ear hearing loss 01/14/2019   Low back pain 01/14/2019   Intractable migraine with aura without status migrainosus 01/07/2019   Genital herpes 05/06/2018  STD exposure 05/06/2018   Obstructive sleep apnea 02/13/2018   Pelvic pain 07/10/2017   Exposure to blood or body fluid 07/07/2017   Trigger thumb of right hand 05/21/2017   Encounter for other orthopedic aftercare 05/21/2017   Trigger finger, right middle finger 03/18/2017   Anxiety 03/18/2017   Chronic tension-type headache, not intractable 02/12/2017   Dysuria 12/02/2016   Pain of right thumb 11/13/2016   Other chest pain 07/27/2016    Onychomycosis 06/12/2016   Encounter for well adult exam with abnormal findings 06/12/2016   Generalized headache 05/15/2016   Lumbar paraspinal muscle spasm 05/15/2016   History of DVT (deep vein thrombosis) 11/18/2015   Elevated PSA 11/18/2015   Acute pulmonary embolism (HCC) 2016 05/09/2015   Peptic ulcer 05/09/2015   Dyspnea 05/02/2015   DVT (deep venous thrombosis) (HCC) 04/26/2015   Duodenal ulcer 04/25/2015   Internal hemorrhoid, bleeding 04/04/2015   Acute gastrointestinal hemorrhage 04/03/2015   Duodenitis 04/03/2015   Postural dizziness with presyncope 04/03/2015   Microscopic hematuria 04/03/2015   Dislocated IOL (intraocular lens), initial encounter 12/10/2014   MRSA infection 09/14/2013   History of methicillin resistant Staphylococcus aureus infection 02/17/2013   Spondylosis of lumbar joint 09/28/2012   Fatty liver disease, nonalcoholic 09/28/2012   Lumbosacral spondylosis without myelopathy 09/28/2012   Fatty metamorphosis of liver 09/28/2012   Atopic dermatitis, unspecified 05/13/2012   Seasonal and perennial allergic rhinitis 10/01/2011   Family history of ischemic heart disease 04/02/2011   Low serum testosterone  level 03/12/2011   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/12/2011   History of colonic polyps 06/21/2009   Hyperlipidemia 02/16/2008   Asthma, mild persistent 02/16/2008   NONSPECIFIC ABNORMAL ELECTROCARDIOGRAM 02/16/2008   NEPHROLITHIASIS, HX OF 02/16/2008   Past Medical History:  Diagnosis Date   Allergy     Past hx    Anemia    Anxiety    Asthma    Cataract    removed left- forming on the right  DVT and PE from long travel trip    Chronic kidney disease 2006   kidney stones    Clotting disorder (HCC) 2016   PE - small    Complication of anesthesia    HARD TO WAKE UP AFTER 2ND EYE SURGERY   COVID-19 virus infection    Depression    DVT (deep venous thrombosis) (HCC)    DUE TO A LONG TRAVEL   Fatty liver    Genital herpes 05/06/2018   H/O renal  calculi 2006   Headache    MIGRAINES   History of kidney stones    H/O   History of methicillin resistant staphylococcus aureus (MRSA)    Hyperlipidemia    Internal hemorrhoid    PE (pulmonary embolism)    Peptic ulcer disease    Pneumonia    h/o   PTSD (post-traumatic stress disorder)    Sleep apnea    DOES NOT USE CPAP CURRENTLY-MASK DOES NOT FIT CORRECTLY [facility]13-20)   Spondylosis    Tubular adenoma of colon 2011   Dr Grandville Lax   Family History  Problem Relation Age of Onset   Heart attack Mother 69   Dementia Father    Stroke Father 97   Diabetes Father        PVD   Glaucoma Father        blindness   Dementia Maternal Aunt    Heart attack Maternal Uncle         X2,pre 57  Esophageal cancer Maternal Uncle    Heart attack Maternal Grandmother 49   Cancer Paternal Grandmother        ? primary   Asthma Neg Hx    COPD Neg Hx    Colon cancer Neg Hx    Colon polyps Neg Hx    Stomach cancer Neg Hx    Rectal cancer Neg Hx    Past Surgical History:  Procedure Laterality Date   CATARACT EXTRACTION Left 1996   Dr Zackary Heron; OS   COLONOSCOPY     colonoscopy with polypectomy  2011   Dr Grandville Lax, hemorrhoids   CYSTOSCOPY W/ STONE MANIPULATION  2006   EYE SURGERY Left    INGUINAL HERNIA REPAIR Right 1990   POLYPECTOMY     SHOULDER SURGERY Right 12/17/2010   Dr Marland Silvas ; shoulder impingement & torn tendon   TRANSURETHRAL RESECTION OF PROSTATE N/A 02/23/2020   Procedure: TRANSURETHRAL RESECTION OF THE PROSTATE (TURP);  Surgeon: Lawerence Pressman, MD;  Location: [facility] ORS;  Service: Urology;  Laterality: N/A;   Social History   Occupational History   Occupation: Disablity  Tobacco Use   Smoking status: Never   Smokeless tobacco: Never  Vaping Use   Vaping status: Never Used  Substance and Sexual Activity   Alcohol use: Yes    Alcohol/week: 0.0 standard drinks of alcohol    Comment: Socially , < 2X/ month   Drug use: No   Sexual  activity: Yes    Birth control/protection: None    Comment: Last encounter: 10th of Jan. 2023, Wife.    Kemi Gell Alvia Jointer, M.D. [facility], Hand Surgery

## 2023-09-23 ENCOUNTER — Ambulatory Visit: Payer: Federal, State, Local not specified - PPO | Admitting: Urology

## 2023-10-21 ENCOUNTER — Encounter: Payer: Self-pay | Admitting: Internal Medicine

## 2023-10-21 ENCOUNTER — Other Ambulatory Visit: Payer: Self-pay

## 2023-10-21 MED ORDER — ATORVASTATIN CALCIUM 10 MG PO TABS: 10.0000 mg | ORAL_TABLET | Freq: Every day | ORAL | 3 refills | Status: AC
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
## 2023-11-03 ENCOUNTER — Ambulatory Visit (INDEPENDENT_AMBULATORY_CARE_PROVIDER_SITE_OTHER): Payer: Medicare Other | Admitting: Neurology

## 2023-11-03 ENCOUNTER — Encounter: Payer: Self-pay | Admitting: Neurology

## 2023-11-03 VITALS — BP 114/84 | HR 67 | Ht 66.0 in | Wt 219.0 lb

## 2023-11-03 DIAGNOSIS — G44229 Chronic tension-type headache, not intractable: Secondary | ICD-10-CM

## 2023-11-03 DIAGNOSIS — G43009 Migraine without aura, not intractable, without status migrainosus: Secondary | ICD-10-CM | POA: Diagnosis not present

## 2023-11-03 MED ORDER — UBRELVY 100 MG PO TABS: 1.0000 | ORAL_TABLET | ORAL | 11 refills | Status: AC | PRN

## 2023-11-03 MED ORDER — NORTRIPTYLINE HCL 25 MG PO CAPS: 25.0000 mg | ORAL_CAPSULE | Freq: Every day | ORAL | 7 refills | Status: AC

## 2023-11-03 NOTE — Patient Instructions (Signed)
 Restart nortriptyline  25mg  at bedtime.  If no improvement in migraines and daily headache in 2 months, contact me Holland - take 1 tablet at earliest onset.  May repeat after 2hours.  Maximum 2 tablets in 24 hours Limit use of pain relievers to no more than 9 days out of the month to prevent risk of rebound or medication-overuse headache. Keep headache diary Follow up 8 months.

## 2023-11-26 ENCOUNTER — Other Ambulatory Visit (HOSPITAL_COMMUNITY): Payer: Self-pay

## 2023-11-26 ENCOUNTER — Telehealth: Payer: Self-pay

## 2023-11-26 NOTE — Telephone Encounter (Signed)
 Pharmacy Patient Advocate Encounter   Received notification from CoverMyMeds that prior authorization for Ubrelvy  100MG  tablets is required/requested.   Insurance verification completed.   The patient is insured through [facility] .   Per test claim: PA required; PA submitted to above mentioned insurance via CoverMyMeds Key/confirmation #/[id number] Status is pending

## 2023-11-26 NOTE — Telephone Encounter (Signed)
 Pharmacy Patient Advocate Encounter  Received notification from [facility] that Prior Authorization for UBRELVY  100MG  has been APPROVED from 4.26.25 to 7.25.26. Unable to obtain price due to refill too soon rejection, last fill date 7.25.25 next available fill date8.17.25   PA #/Case ID/Reference #: [id number]

## 2023-12-16 ENCOUNTER — Encounter: Payer: Self-pay | Admitting: Urology

## 2023-12-21 ENCOUNTER — Other Ambulatory Visit: Payer: Self-pay

## 2023-12-21 ENCOUNTER — Ambulatory Visit (INDEPENDENT_AMBULATORY_CARE_PROVIDER_SITE_OTHER): Admitting: Urology

## 2023-12-21 ENCOUNTER — Other Ambulatory Visit
Admission: RE | Admit: 2023-12-21 | Discharge: 2023-12-21 | Disposition: A | Discharge status: Home / Self Care | Attending: Urology | Admitting: Urology

## 2023-12-21 DIAGNOSIS — R300 Dysuria: Secondary | ICD-10-CM

## 2023-12-21 LAB — URINALYSIS, COMPLETE (UACMP) WITH MICROSCOPIC
Bilirubin Urine: NEGATIVE
Glucose, UA: NEGATIVE mg/dL
Ketones, ur: NEGATIVE mg/dL
Leukocytes,Ua: NEGATIVE
Nitrite: NEGATIVE
Protein, ur: NEGATIVE mg/dL
Specific Gravity, Urine: 1.015 (ref 1.005–1.030)
Squamous Epithelial / HPF: NONE SEEN /HPF (ref 0–5)
WBC, UA: NONE SEEN WBC/hpf (ref 0–5)
pH: 7 (ref 5.0–8.0)

## 2023-12-21 LAB — CHLAMYDIA/NGC RT PCR (ARMC ONLY)
Chlamydia Tr: NOT DETECTED
N gonorrhoeae: NOT DETECTED

## 2023-12-21 NOTE — Progress Notes (Signed)
   12/21/2023 11:25 AM   Adriana LITTIE Sharps 1966-01-09 [id number]  Reason for visit: Dysuria  HPI: 58 year old male with history of urinary symptoms of dysuria who was found to have a prostatic cyst, and ultimately underwent TUR of this lesion with me in October 2021.  Pathology showed only cystitis cystica.  This had resolved his burning with urination and other urinary symptoms.   He overall has done well, but has had intermittent urgent care visits for dysuria that typically occur or sexual activity with different partners.  He has deferred STD testing in the past.  Urinalysis today is benign  His most recent dysuria started after sexual activity with a partner he had not been with any while.  He felt there was an odor at the time of sexual intercourse.  He has had intermittent dysuria since then about a week ago, thinks things may be improving.  Denies any gross hematuria.  Urine today sent for G/C and atypicals, call with results  Redell JAYSON Burnet, MD  [facility] [address], [address] [place], [place] [address] [phone]

## 2023-12-22 ENCOUNTER — Ambulatory Visit: Payer: Self-pay | Admitting: Urology

## 2023-12-28 LAB — MISC LABCORP TEST (SEND OUT): Labcorp test code: 86884

## 2024-01-10 ENCOUNTER — Encounter: Payer: Self-pay | Admitting: Internal Medicine

## 2024-01-11 NOTE — Telephone Encounter (Signed)
 Ok to try OTC Nasacort and /or Allegra 180 mg po every day prn, but if has fever, worsening pain , sob or wheezing, he should consider ROV

## 2024-01-11 NOTE — Telephone Encounter (Signed)
 Also if he can do a home test for COVID, that would also be helpful.

## 2024-02-25 ENCOUNTER — Ambulatory Visit: Payer: Medicare Other | Admitting: Internal Medicine

## 2024-03-06 ENCOUNTER — Encounter: Payer: Self-pay | Admitting: Radiology

## 2024-03-15 ENCOUNTER — Ambulatory Visit: Payer: Self-pay | Admitting: Internal Medicine

## 2024-03-15 ENCOUNTER — Encounter: Payer: Self-pay | Admitting: Internal Medicine

## 2024-03-15 ENCOUNTER — Ambulatory Visit (INDEPENDENT_AMBULATORY_CARE_PROVIDER_SITE_OTHER)

## 2024-03-15 ENCOUNTER — Ambulatory Visit: Admitting: Internal Medicine

## 2024-03-15 VITALS — BP 118/80 | HR 67 | Temp 98.6°F | Ht 66.0 in | Wt 209.0 lb

## 2024-03-15 VITALS — BP 118/80 | HR 67 | Ht 66.0 in | Wt 209.0 lb

## 2024-03-15 DIAGNOSIS — E559 Vitamin D deficiency, unspecified: Secondary | ICD-10-CM | POA: Diagnosis not present

## 2024-03-15 DIAGNOSIS — E785 Hyperlipidemia, unspecified: Secondary | ICD-10-CM | POA: Diagnosis not present

## 2024-03-15 DIAGNOSIS — M19041 Primary osteoarthritis, right hand: Secondary | ICD-10-CM

## 2024-03-15 DIAGNOSIS — H9193 Unspecified hearing loss, bilateral: Secondary | ICD-10-CM | POA: Diagnosis not present

## 2024-03-15 DIAGNOSIS — Z0000 Encounter for general adult medical examination without abnormal findings: Secondary | ICD-10-CM

## 2024-03-15 DIAGNOSIS — R7303 Prediabetes: Secondary | ICD-10-CM

## 2024-03-15 DIAGNOSIS — J069 Acute upper respiratory infection, unspecified: Secondary | ICD-10-CM | POA: Diagnosis not present

## 2024-03-15 DIAGNOSIS — M19049 Primary osteoarthritis, unspecified hand: Secondary | ICD-10-CM | POA: Insufficient documentation

## 2024-03-15 LAB — BASIC METABOLIC PANEL WITH GFR
BUN: 12 mg/dL (ref 6–23)
CO2: 26 meq/L (ref 19–32)
Calcium: 9.1 mg/dL (ref 8.4–10.5)
Chloride: 104 meq/L (ref 96–112)
Creatinine, Ser: 0.92 mg/dL (ref 0.40–1.50)
GFR: 92.58 mL/min (ref >60.00)
Glucose, Bld: 93 mg/dL (ref 70–99)
Potassium: 4.2 meq/L (ref 3.5–5.1)
Sodium: 138 meq/L (ref 135–145)

## 2024-03-15 LAB — HEPATIC FUNCTION PANEL
ALT: 18 U/L (ref 0–53)
AST: 17 U/L (ref 0–37)
Albumin: 4.2 g/dL (ref 3.5–5.2)
Alkaline Phosphatase: 80 U/L (ref 39–117)
Bilirubin, Direct: 0.1 mg/dL (ref 0.0–0.3)
Total Bilirubin: 0.6 mg/dL (ref 0.2–1.2)
Total Protein: 7.4 g/dL (ref 6.0–8.3)

## 2024-03-15 LAB — LIPID PANEL
Cholesterol: 122 mg/dL (ref 0–200)
HDL: 47 mg/dL (ref >39.00)
LDL Cholesterol: 58 mg/dL (ref 0–99)
NonHDL: 74.5
Total CHOL/HDL Ratio: 3
Triglycerides: 83 mg/dL (ref 0.0–149.0)
VLDL: 16.6 mg/dL (ref 0.0–40.0)

## 2024-03-15 LAB — CBC WITH DIFFERENTIAL/PLATELET
Basophils Absolute: 0 K/uL (ref 0.0–0.1)
Basophils Relative: 0.8 % (ref 0.0–3.0)
Eosinophils Absolute: 0.6 K/uL (ref 0.0–0.7)
Eosinophils Relative: 9.5 % — ABNORMAL HIGH (ref 0.0–5.0)
HCT: 44.1 % (ref 39.0–52.0)
Hemoglobin: 14.6 g/dL (ref 13.0–17.0)
Lymphocytes Relative: 32.3 % (ref 12.0–46.0)
Lymphs Abs: 1.9 K/uL (ref 0.7–4.0)
MCHC: 33.1 g/dL (ref 30.0–36.0)
MCV: 83.8 fl (ref 78.0–100.0)
Monocytes Absolute: 0.6 K/uL (ref 0.1–1.0)
Monocytes Relative: 10.7 % (ref 3.0–12.0)
Neutro Abs: 2.7 K/uL (ref 1.4–7.7)
Neutrophils Relative %: 46.7 % (ref 43.0–77.0)
Platelets: 252 K/uL (ref 150.0–400.0)
RBC: 5.26 Mil/uL (ref 4.22–5.81)
RDW: 15.5 % (ref 11.5–15.5)
WBC: 5.8 K/uL (ref 4.0–10.5)

## 2024-03-15 LAB — URINALYSIS, ROUTINE W REFLEX MICROSCOPIC
Bilirubin Urine: NEGATIVE
Hgb urine dipstick: NEGATIVE
Ketones, ur: NEGATIVE
Leukocytes,Ua: NEGATIVE
Nitrite: NEGATIVE
Specific Gravity, Urine: 1.01 (ref 1.000–1.030)
Total Protein, Urine: NEGATIVE
Urine Glucose: NEGATIVE
Urobilinogen, UA: 0.2 (ref 0.0–1.0)
pH: 6.5 (ref 5.0–8.0)

## 2024-03-15 LAB — HEMOGLOBIN A1C: Hgb A1c MFr Bld: 6.2 % (ref 4.6–6.5)

## 2024-03-15 NOTE — Assessment & Plan Note (Signed)
 Lab Results  Component Value Date   HGBA1C 6.2 09/09/2023   Stable, pt to continue current medical treatment  - diet, wt control

## 2024-03-15 NOTE — Assessment & Plan Note (Signed)
 Last vitamin D  Lab Results  Component Value Date   VD25OH 39.25 09/09/2023   Low, reminded to start oral replacement '

## 2024-03-15 NOTE — Assessment & Plan Note (Signed)
 Lab Results  Component Value Date   LDLCALC 108 (H) 09/09/2023   Mild uncontrolled, pt declines increase so continue lipiitor 10 mg every day for now

## 2024-03-15 NOTE — Patient Instructions (Signed)
 Ok to try the OTC Voltaren  gel as needed for the arthritis in the fingers  Please continue all other medications as before, and refills have been done if requested.  Please have the pharmacy call with any other refills you may need.  Please continue your efforts at being more active, low cholesterol diet, and weight control.  Please keep your appointments with your specialists as you may have planned  Please go to the LAB at the blood drawing area for the tests to be done  You will be contacted by phone if any changes need to be made immediately.  Otherwise, you will receive a letter about your results with an explanation, but please check with MyChart first.  Please make an Appointment to return in 6 months, or sooner if needed

## 2024-03-15 NOTE — Assessment & Plan Note (Signed)
 Mild worsening pain, for otc Voltaren  gel prn

## 2024-03-15 NOTE — Patient Instructions (Addendum)
 Mr. Denomme,  Thank you for taking the time for your Medicare Wellness Visit. I appreciate your continued commitment to your health goals. Please review the care plan we discussed, and feel free to reach out if I can assist you further.  Please note that Annual Wellness Visits do not include a physical exam. Some assessments may be limited, especially if the visit was conducted virtually. If needed, we may recommend an in-person follow-up with your provider.  Ongoing Care Seeing your primary care provider every 3 to 6 months helps us  monitor your health and provide consistent, personalized care.   Referrals If a referral was made during today's visit and you haven't received any updates within two weeks, please contact the referred provider directly to check on the status.  Recommended Screenings:  Health Maintenance  Topic Date Due   Pneumococcal Vaccine for age over 17 (1 of 2 - PCV) Never done   Hepatitis B Vaccine (1 of 3 - 19+ 3-dose series) Never done   Flu Shot  Never done   COVID-19 Vaccine (4 - 2025-26 season) 01/03/2024   Medicare Annual Wellness Visit  03/15/2025   Colon Cancer Screening  08/15/2030   DTaP/Tdap/Td vaccine (3 - Td or Tdap) 02/18/2033   Hepatitis C Screening  Completed   HIV Screening  Completed   HPV Vaccine  Aged Out   Meningitis B Vaccine  Aged Out   Zoster (Shingles) Vaccine  Discontinued       03/15/2024   10:34 AM  Advanced Directives  Does Patient Have a Medical Advance Directive? No  Would patient like information on creating a medical advance directive? Yes (Inpatient - patient requests chaplain consult to create a medical advance directive);No - Patient declined    Vision: Annual vision screenings are recommended for early detection of glaucoma, cataracts, and diabetic retinopathy. These exams can also reveal signs of chronic conditions such as diabetes and high blood pressure.  Dental: Annual dental screenings help detect early signs of oral  cancer, gum disease, and other conditions linked to overall health, including heart disease and diabetes.

## 2024-03-15 NOTE — Progress Notes (Signed)
 The test results show that your current treatment is OK, as the tests are stable.  Please continue the same plan.  There is no other need for change of treatment or further evaluation based on these results, at this time.  thanks

## 2024-03-15 NOTE — Assessment & Plan Note (Signed)
 Resolving, pt reassured, no further tx needed

## 2024-03-15 NOTE — Progress Notes (Addendum)
 Chief Complaint  Patient presents with   Medicare Wellness     Subjective:   Anthony [name] is a 58 y.o. male who presents for a Medicare Annual Wellness Visit.  Allergies (verified) Aleve [naproxen] and Nsaids   History: Past Medical History:  Diagnosis Date   Allergy     Past hx    Anemia    Anxiety    Asthma    Cataract    removed left- forming on the right  DVT and PE from long travel trip    Chronic kidney disease 2006   kidney stones    Clotting disorder 2016   PE - small    Complication of anesthesia    HARD TO WAKE UP AFTER 2ND EYE SURGERY   COVID-19 virus infection    Depression    DVT (deep venous thrombosis) (HCC)    DUE TO A LONG TRAVEL   Fatty liver    Genital herpes 05/06/2018   H/O renal calculi 2006   Headache    MIGRAINES   History of kidney stones    H/O   History of methicillin resistant staphylococcus aureus (MRSA)    Hyperlipidemia    Internal hemorrhoid    PE (pulmonary embolism)    Peptic ulcer disease    Pneumonia    h/o   PTSD (post-traumatic stress disorder)    Sleep apnea    DOES NOT USE CPAP CURRENTLY-MASK DOES NOT FIT CORRECTLY [facility]13-20)   Spondylosis    Tubular adenoma of colon 2011   Dr Obie   Past Surgical History:  Procedure Laterality Date   CATARACT EXTRACTION Left 1996   Dr Lilian; OS   COLONOSCOPY     colonoscopy with polypectomy  2011   Dr Obie, hemorrhoids   CYSTOSCOPY W/ STONE MANIPULATION  2006   EYE SURGERY Left    INGUINAL HERNIA REPAIR Right 1990   POLYPECTOMY     SHOULDER SURGERY Right 12/17/2010   Dr Shari ; shoulder impingement & torn tendon   TRANSURETHRAL RESECTION OF PROSTATE N/A 02/23/2020   Procedure: TRANSURETHRAL RESECTION OF THE PROSTATE (TURP);  Surgeon: Francisca Redell BROCKS, MD;  Location: [facility] ORS;  Service: Urology;  Laterality: N/A;   Family History  Problem Relation Age of Onset   Heart attack Mother 3   Dementia Father    Stroke Father 60    Diabetes Father        PVD   Glaucoma Father        blindness   Dementia Maternal Aunt    Heart attack Maternal Uncle         X2,pre 55   Esophageal cancer Maternal Uncle    Heart attack Maternal Grandmother 64   Cancer Paternal Grandmother        ? primary   Asthma Neg Hx    COPD Neg Hx    Colon cancer Neg Hx    Colon polyps Neg Hx    Stomach cancer Neg Hx    Rectal cancer Neg Hx    Social History   Occupational History   Occupation: [employer]  Tobacco Use   Smoking status: Never   Smokeless tobacco: Never  Vaping Use   Vaping status: Never Used  Substance and Sexual Activity   Alcohol use: Yes    Alcohol/week: 1.0 standard drink of alcohol    Types: 1 Shots of liquor per week    Comment: Socially , < 2X/ month  Drug use: No   Sexual activity: Yes    Birth control/protection: Condom    Comment: Last encounter: 10th of Jan. 2023, Wife.   Tobacco Counseling Counseling given: Not Answered  SDOH Screenings   Food Insecurity: No Food Insecurity (03/15/2024)  Housing: Unknown (03/15/2024)  Transportation Needs: No Transportation Needs (03/15/2024)  Utilities: Not At Risk (03/15/2024)  Alcohol Screen: Low Risk  (03/09/2023)  Depression (PHQ2-9): Low Risk  (03/15/2024)  Financial Resource Strain: Low Risk  (11/02/2023)   Received from [facility]  Physical Activity: Inactive (03/15/2024)  Social Connections: Moderately Isolated (03/15/2024)  Stress: No Stress Concern Present (03/15/2024)  Tobacco Use: Low Risk  (03/15/2024)  Health Literacy: Adequate Health Literacy (03/15/2024)   Depression Screen    03/15/2024   10:52 AM 09/09/2023    9:05 AM 07/01/2023    3:26 PM 03/12/2023    9:07 AM 03/09/2023    1:58 PM 09/25/2022   10:50 AM 09/09/2022    1:30 PM  PHQ 2/9 Scores  PHQ - 2 Score 0 0 0 0 1 0 0  PHQ- 9 Score 0  0  0  2  3  0      Data saved with a previous flowsheet row definition     Goals Addressed               This Visit's Progress     Patient  Stated (pt-stated)        Patient stated he plans to continue walking but want to lose weight and watch sugar intake       Visit info / Clinical Intake: Medicare Wellness Visit Type:: Subsequent Annual Wellness Visit Persons participating in visit:: patient Medicare Wellness Visit Mode:: In-person (required for WTM) Information given by:: patient Interpreter Needed?: No Pre-visit prep was completed: yes AWV questionnaire completed by patient prior to visit?: no Living arrangements:: with family/others (daughter lives with him) Patient's Overall Health Status Rating: good Typical amount of pain: none Does pain affect daily life?: no Are you currently prescribed opioids?: no  Dietary Habits and Nutritional Risks How many meals a day?: 2 Eats fruit and vegetables daily?: yes Most meals are obtained by: preparing own meals In the last 2 weeks, have you had any of the following?: none Diabetic:: no  Functional Status Activities of Daily Living (to include ambulation/medication): Independent Ambulation: Independent with device- listed below Home Assistive Devices/Equipment: Eyeglasses; CPAP Medication Administration: Independent Home Management: Independent Manage your own finances?: yes Primary transportation is: driving Concerns about vision?: no *vision screening is required for WTM* Concerns about hearing?: no  Fall Screening Falls in the past year?: 0 Number of falls in past year: 0 Was there an injury with Fall?: 0 Fall Risk Category Calculator: 0 Patient Fall Risk Level: Low Fall Risk  Fall Risk Patient at Risk for Falls Due to: No Fall Risks Fall risk Follow up: Falls evaluation completed; Falls prevention discussed  Home and Transportation Safety: All rugs have non-skid backing?: yes All stairs or steps have railings?: yes Grab bars in the bathtub or shower?: (!) no Have non-skid surface in bathtub or shower?: yes Good home lighting?: yes Regular seat belt  use?: yes Hospital stays in the last year:: no  Cognitive Assessment Difficulty concentrating, remembering, or making decisions? : yes Will 6CIT or Mini Cog be Completed: yes What year is it?: 0 points What month is it?: 0 points Give patient an address phrase to remember (5 components): [address] [place], [place] About what time  is it?: 0 points Count backwards from 20 to 1: 0 points Say the months of the year in reverse: 0 points Repeat the address phrase from earlier: 0 points 6 CIT Score: 0 points  Advance Directives (For Healthcare) Does Patient Have a Medical Advance Directive?: No Would patient like information on creating a medical advance directive?: Yes (Inpatient - patient requests chaplain consult to create a medical advance directive); No - Patient declined  Reviewed/Updated  Reviewed/Updated: Reviewed All (Medical, Surgical, Family, Medications, Allergies, Care Teams, Patient Goals)        Objective:    Today's Vitals   03/15/24 1007  BP: 118/80  Pulse: 67  SpO2: 97%  Weight: 209 lb (94.8 kg)  Height: 5' 6 (1.676 m)   Body mass index is 33.73 kg/m.  Current Medications (verified) Outpatient Encounter Medications as of 03/15/2024  Medication Sig   acetaminophen  (TYLENOL ) 500 MG tablet Take 1,000 mg by mouth every 6 (six) hours as needed for moderate pain.   albuterol  (PROVENTIL  HFA;VENTOLIN  HFA) 108 (90 Base) MCG/ACT inhaler Inhale 2 puffs into the lungs every 6 (six) hours as needed. For shortness of breath   aspirin EC 81 MG tablet Take 81 mg by mouth daily.   atorvastatin  (LIPITOR) 10 MG tablet Take 1 tablet (10 mg total) by mouth daily.   Brinzolamide-Brimonidine 1-0.2 % SUSP Apply to eye.   budesonide -formoterol  (SYMBICORT ) 160-4.5 MCG/ACT inhaler Inhale 2 puffs into the lungs.   busPIRone  (BUSPAR ) 15 MG tablet Take 15 mg by mouth.   celecoxib  (CELEBREX ) 200 MG capsule Take 200 mg by mouth 3 (three) times daily.   cetirizine  (ZYRTEC ) 10 MG tablet  Take 10 mg by mouth.   Cholecalciferol (VITAMIN D3 PO) Take 1 tablet by mouth daily.   fluticasone  (FLONASE ) 50 MCG/ACT nasal spray Place into the nose.   guaiFENesin  (MUCINEX ) 600 MG 12 hr tablet Take 2 tablets (1,200 mg total) by mouth 2 (two) times daily as needed.   HYDROcodone -acetaminophen  (NORCO/VICODIN) 5-325 MG tablet Take 1 tablet by mouth every 6 (six) hours as needed.   hydrocortisone 2.5 % cream Apply topically.   ketoconazole (NIZORAL) 2 % cream Apply topically.   Multiple Vitamin (MULTIVITAMIN) tablet Take 1 tablet by mouth daily.   nortriptyline  (PAMELOR ) 25 MG capsule Take 1 capsule (25 mg total) by mouth at bedtime.   OXcarbazepine (TRILEPTAL) 150 MG tablet Take 150 mg by mouth. (Patient taking differently: Take 150 mg by mouth as needed.)   prednisoLONE acetate (PRED FORTE) 1 % ophthalmic suspension Apply 1 drop to eye.   riboflavin (VITAMIN B-2) 100 MG TABS tablet Take 400 mg by mouth.   sertraline (ZOLOFT) 50 MG tablet Take 50 mg by mouth. (Patient taking differently: Take 50 mg by mouth as needed.)   tadalafil  (CIALIS ) 20 MG tablet Take 0.5-1 tablets (10-20 mg total) by mouth daily as needed for erectile dysfunction (take 45 minutes prior to sexual activity).   Ubrogepant  (UBRELVY ) 100 MG TABS Take 1 tablet (100 mg total) by mouth as needed. May repeat after 2 hours.  Maximum 2 tablets in 24 hours.   valACYclovir  (VALTREX ) 1000 MG tablet Take 1/2 tablet by mouth twice daily for 3 days or 1 tablet by mouth every day for 5 days at onset of breakout. (Patient taking differently: Take 500 mg by mouth as needed. Take 1/2 tablet by mouth twice daily for 3 days or 1 tablet by mouth every day for 5 days at onset of breakout.)   [DISCONTINUED] omeprazole  (PRILOSEC)  20 MG capsule Take 1 capsule (20 mg total) by mouth daily.   [DISCONTINUED] topiramate (TOPAMAX) 25 MG tablet topiramate 25 mg tablet (Patient not taking: No sig reported)   No facility-administered encounter medications on  file as of 03/15/2024.   Hearing/Vision screen Hearing Screening - Comments:: Denies hearing difficulties   Vision Screening - Comments:: Wears rx glasses - up to date with routine eye exams with Dr Fleeta of [facility] Immunizations and Health Maintenance Health Maintenance  Topic Date Due   Pneumococcal Vaccine: 50+ Years (1 of 2 - PCV) Never done   Hepatitis B Vaccines 19-59 Average Risk (1 of 3 - 19+ 3-dose series) Never done   Influenza Vaccine  Never done   COVID-19 Vaccine (4 - 2025-26 season) 01/03/2024   Medicare Annual Wellness (AWV)  03/15/2025   Colonoscopy  08/15/2030   DTaP/Tdap/Td (3 - Td or Tdap) 02/18/2033   Hepatitis C Screening  Completed   HIV Screening  Completed   HPV VACCINES  Aged Out   Meningococcal B Vaccine  Aged Out   Zoster Vaccines- Shingrix  Discontinued        Assessment/Plan:  This is a routine wellness examination for Mihail.  Patient Care Team: Norleen Lynwood ORN, MD as PCP - General (Internal Medicine) Thukkani, Arun K, MD as PCP - Cardiology (Cardiology) Skeet Juliene SAUNDERS, DO as Consulting Physician (Neurology) Cleatus Collar, MD as Consulting Physician (Ophthalmology)  I have personally reviewed and noted the following in the patient's chart:   Medical and social history Use of alcohol, tobacco or illicit drugs  Current medications and supplements including opioid prescriptions. Functional ability and status Nutritional status Physical activity Advanced directives List of other physicians Hospitalizations, surgeries, and ER visits in previous 12 months Vitals Screenings to include cognitive, depression, and falls Referrals and appointments  Orders Placed This Encounter  Procedures   Ambulatory referral to Audiology    Referral Priority:   Routine    Referral Type:   Audiology Exam    Referral Reason:   Specialty Services Required    Referred to Provider:   Helane Darryle LABOR, AUD    Number of Visits Requested:   1   Amb ref to Medical  Nutrition Therapy-MNT    Referral Priority:   Routine    Referral Type:   Consultation    Referral Reason:   Specialty Services Required    Referred to Provider:   Kristine Ronal BIRCH, RD    Requested Specialty:   Nutrition    Number of Visits Requested:   1   In addition, I have reviewed and discussed with patient certain preventive protocols, quality metrics, and best practice recommendations. A written personalized care plan for preventive services as well as general preventive health recommendations were provided to patient.   Verdie CHRISTELLA Saba, CMA   03/15/2024   Return in 1 year (on 03/15/2025).  After Visit Summary: (In Person-Declined) Patient declined AVS at this time.  Nurse Notes: Scheduled 2026 AWV appt.

## 2024-03-15 NOTE — Progress Notes (Signed)
 Patient ID: Anthony [name], male   DOB: 07/18/1965, 58 y.o.   MRN: [id number]        Chief Complaint: follow up viral URI, right hand arthritis, low vit d, preDM, hld       HPI:  Anthony [name] is a 59 y.o. male here overall doing ok except for worsening pain and bony swelling to the third finger right hand PIP with some reduced ROM.   Did also have viral URI last wk after caring for the 2 yo grandchild but no fever, pain and feeling better now after seen at [facility] with tx with sudafed, tessalon , and mucinex .  Pt denies chest pain, increased sob or doe, wheezing, orthopnea, PND, increased LE swelling, palpitations, dizziness or syncope.   Pt denies polydipsia, polyuria, or new focal neuro s/s.    Pt denies fever, wt loss, night sweats, loss of appetite, or other constitutional symptoms         Wt Readings from Last 3 Encounters:  03/15/24 209 lb (94.8 kg)  03/15/24 209 lb (94.8 kg)  12/21/23 215 lb 3.2 oz (97.6 kg)   BP Readings from Last 3 Encounters:  03/15/24 118/80  03/15/24 118/80  12/21/23 118/83         Past Medical History:  Diagnosis Date   Allergy     Past hx    Anemia    Anxiety    Asthma    Cataract    removed left- forming on the right  DVT and PE from long travel trip    Chronic kidney disease 2006   kidney stones    Clotting disorder 2016   PE - small    Complication of anesthesia    HARD TO WAKE UP AFTER 2ND EYE SURGERY   COVID-19 virus infection    Depression    DVT (deep venous thrombosis) (HCC)    DUE TO A LONG TRAVEL   Fatty liver    Genital herpes 05/06/2018   H/O renal calculi 2006   Headache    MIGRAINES   History of kidney stones    H/O   History of methicillin resistant staphylococcus aureus (MRSA)    Hyperlipidemia    Internal hemorrhoid    PE (pulmonary embolism)    Peptic ulcer disease    Pneumonia    h/o   PTSD (post-traumatic stress disorder)    Sleep apnea    DOES NOT USE CPAP CURRENTLY-MASK DOES NOT FIT CORRECTLY [facility] (02-13-20)   Spondylosis    Tubular adenoma of colon 2011   Dr Obie   Past Surgical History:  Procedure Laterality Date   CATARACT EXTRACTION Left 1996   Dr Lilian; OS   COLONOSCOPY     colonoscopy with polypectomy  2011   Dr Obie, hemorrhoids   CYSTOSCOPY W/ STONE MANIPULATION  2006   EYE SURGERY Left    INGUINAL HERNIA REPAIR Right 1990   POLYPECTOMY     SHOULDER SURGERY Right 12/17/2010   Dr Shari ; shoulder impingement & torn tendon   TRANSURETHRAL RESECTION OF PROSTATE N/A 02/23/2020   Procedure: TRANSURETHRAL RESECTION OF THE PROSTATE (TURP);  Surgeon: Francisca Redell BROCKS, MD;  Location: [facility] ORS;  Service: Urology;  Laterality: N/A;    reports that he has never smoked. He has never used smokeless tobacco. He reports current alcohol use. He reports that he does not use drugs. family history includes Cancer in his paternal grandmother; Dementia in his father  and maternal aunt; Diabetes in his father; Esophageal cancer in his maternal uncle; Glaucoma in his father; Heart attack in his maternal uncle; Heart attack (age of onset: 72) in his mother; Heart attack (age of onset: 35) in his maternal grandmother; Stroke (age of onset: 17) in his father. Allergies  Allergen Reactions   Aleve [Naproxen] Rash    Patient stated it was years ago   Nsaids Other (See Comments)    Bleeding ulcers   Current Outpatient Medications on File Prior to Visit  Medication Sig Dispense Refill   acetaminophen  (TYLENOL ) 500 MG tablet Take 1,000 mg by mouth every 6 (six) hours as needed for moderate pain.     albuterol  (PROVENTIL  HFA;VENTOLIN  HFA) 108 (90 Base) MCG/ACT inhaler Inhale 2 puffs into the lungs every 6 (six) hours as needed. For shortness of breath 1 Inhaler 1   aspirin EC 81 MG tablet Take 81 mg by mouth daily.     atorvastatin  (LIPITOR) 10 MG tablet Take 1 tablet (10 mg total) by mouth daily. 90 tablet 3   Brinzolamide-Brimonidine 1-0.2 % SUSP Apply to eye.      budesonide -formoterol  (SYMBICORT ) 160-4.5 MCG/ACT inhaler Inhale 2 puffs into the lungs.     busPIRone  (BUSPAR ) 15 MG tablet Take 15 mg by mouth.     celecoxib  (CELEBREX ) 200 MG capsule Take 200 mg by mouth 3 (three) times daily.     cetirizine  (ZYRTEC ) 10 MG tablet Take 10 mg by mouth.     Cholecalciferol (VITAMIN D3 PO) Take 1 tablet by mouth daily.     fluticasone  (FLONASE ) 50 MCG/ACT nasal spray Place into the nose.     guaiFENesin  (MUCINEX ) 600 MG 12 hr tablet Take 2 tablets (1,200 mg total) by mouth 2 (two) times daily as needed. 60 tablet 1   HYDROcodone -acetaminophen  (NORCO/VICODIN) 5-325 MG tablet Take 1 tablet by mouth every 6 (six) hours as needed.     hydrocortisone 2.5 % cream Apply topically.     ketoconazole (NIZORAL) 2 % cream Apply topically.     Multiple Vitamin (MULTIVITAMIN) tablet Take 1 tablet by mouth daily.     nortriptyline  (PAMELOR ) 25 MG capsule Take 1 capsule (25 mg total) by mouth at bedtime. 30 capsule 7   OXcarbazepine (TRILEPTAL) 150 MG tablet Take 150 mg by mouth. (Patient taking differently: Take 150 mg by mouth as needed.)     prednisoLONE acetate (PRED FORTE) 1 % ophthalmic suspension Apply 1 drop to eye.     riboflavin (VITAMIN B-2) 100 MG TABS tablet Take 400 mg by mouth.     sertraline (ZOLOFT) 50 MG tablet Take 50 mg by mouth. (Patient taking differently: Take 50 mg by mouth as needed.)     tadalafil  (CIALIS ) 20 MG tablet Take 0.5-1 tablets (10-20 mg total) by mouth daily as needed for erectile dysfunction (take 45 minutes prior to sexual activity). 30 tablet 6   Ubrogepant  (UBRELVY ) 100 MG TABS Take 1 tablet (100 mg total) by mouth as needed. May repeat after 2 hours.  Maximum 2 tablets in 24 hours. 16 tablet 11   valACYclovir  (VALTREX ) 1000 MG tablet Take 1/2 tablet by mouth twice daily for 3 days or 1 tablet by mouth every day for 5 days at onset of breakout. (Patient taking differently: Take 500 mg by mouth as needed. Take 1/2 tablet by mouth twice daily  for 3 days or 1 tablet by mouth every day for 5 days at onset of breakout.) 90 tablet 1   [DISCONTINUED] omeprazole  (  PRILOSEC) 20 MG capsule Take 1 capsule (20 mg total) by mouth daily. 30 capsule 0   [DISCONTINUED] topiramate (TOPAMAX) 25 MG tablet topiramate 25 mg tablet (Patient not taking: No sig reported)     No current facility-administered medications on file prior to visit.        ROS:  All others reviewed and negative.  Objective        PE:  BP 118/80 (BP Location: Left Arm, Patient Position: Sitting, Cuff Size: Normal)   Pulse 67   Temp 98.6 F (37 C) (Oral)   Ht 5' 6 (1.676 m)   Wt 209 lb (94.8 kg)   SpO2 97%   BMI 33.73 kg/m                 Constitutional: Pt appears in NAD               HENT: Head: NCAT.                Right Ear: External ear normal.                 Left Ear: External ear normal. Bilat tm's with mild erythema.  Max sinus areas non tender.  Pharynx with mild erythema, no exudate                Eyes: . Pupils are equal, round, and reactive to light. Conjunctivae and EOM are normal               Nose: without d/c or deformity               Neck: Neck supple. Gross normal ROM               Cardiovascular: Normal rate and regular rhythm.                 Pulmonary/Chest: Effort normal and breath sounds without rales or wheezing.                Abd:  Soft, NT, ND, + BS, no organomegaly               Neurological: Pt is alert. At baseline orientation, motor grossly intact               Skin: Skin is warm. No rashes, no other new lesions, LE edema - none, right third finger PIP with bony swelilng mild tender               Psychiatric: Pt behavior is normal without agitation   Micro: none  Cardiac tracings I have personally interpreted today:  none  Pertinent Radiological findings (summarize): none   Lab Results  Component Value Date   WBC 4.9 09/09/2023   HGB 15.8 09/09/2023   HCT 47.4 09/09/2023   PLT 218.0 09/09/2023   GLUCOSE 97 09/09/2023    CHOL 185 09/09/2023   TRIG 120.0 09/09/2023   HDL 52.60 09/09/2023   LDLDIRECT 89.0 04/20/2019   LDLCALC 108 (H) 09/09/2023   ALT 20 09/09/2023   AST 18 09/09/2023   NA 138 09/09/2023   K 4.4 09/09/2023   CL 105 09/09/2023   CREATININE 0.93 09/09/2023   BUN 12 09/09/2023   CO2 27 09/09/2023   TSH 1.14 09/09/2023   PSA 0.82 09/09/2023   HGBA1C 6.2 09/09/2023   Assessment/Plan:  Anthony [name] is a 58 y.o. Black or African American [2] male with  has a past medical history of Allergy , Anemia, Anxiety,  Asthma, Cataract, Chronic kidney disease (2006), Clotting disorder (2016), Complication of anesthesia, COVID-19 virus infection, Depression, DVT (deep venous thrombosis) (HCC), Fatty liver, Genital herpes (05/06/2018), H/O renal calculi (2006), Headache, History of kidney stones, History of methicillin resistant staphylococcus aureus (MRSA), Hyperlipidemia, Internal hemorrhoid, PE (pulmonary embolism), Peptic ulcer disease, Pneumonia, PTSD (post-traumatic stress disorder), Sleep apnea, Spondylosis, and Tubular adenoma of colon (2011).  Hand arthritis Mild worsening pain, for otc Voltaren  gel prn  Viral URI Resolving, pt reassured, no further tx needed  Vitamin D  deficiency Last vitamin D  Lab Results  Component Value Date   VD25OH 39.25 09/09/2023   Low, reminded to start oral replacement '  Prediabetes Lab Results  Component Value Date   HGBA1C 6.2 09/09/2023   Stable, pt to continue current medical treatment - diet, wt control   Hyperlipidemia Lab Results  Component Value Date   LDLCALC 108 (H) 09/09/2023   Mild uncontrolled, pt declines increase so continue lipiitor 10 mg every day for now  Followup: Return in about 6 months (around 09/12/2024).  Anthony Rush, MD 03/15/2024 10:23 AM [facility]

## 2024-03-16 ENCOUNTER — Ambulatory Visit

## 2024-04-10 ENCOUNTER — Telehealth: Payer: Self-pay | Admitting: Internal Medicine

## 2024-04-10 NOTE — Telephone Encounter (Signed)
 Patient would like to switch from Dr. Wendel to Dr. Ren Ny.

## 2024-04-18 ENCOUNTER — Encounter: Admitting: Nutrition

## 2024-04-18 ENCOUNTER — Encounter: Payer: Self-pay | Admitting: Nutrition

## 2024-04-18 VITALS — Ht 66.0 in | Wt 217.0 lb

## 2024-04-18 DIAGNOSIS — R7303 Prediabetes: Secondary | ICD-10-CM | POA: Insufficient documentation

## 2024-04-18 NOTE — Patient Instructions (Addendum)
 Get back to the gym for 1-2 hours a day 5 days per week Eat thee meals per day Try some Healthy Choice Meals for lunch and dinner. Get A1C less than 5.7% Cut out processed food and fast food. Watch portion sizes.

## 2024-04-18 NOTE — Progress Notes (Signed)
 Medical Nutrition Therapy  Appointment Start time:  1400  Appointment End time:  1515  Primary concerns today: Pre Diabetes  Referral diagnosis: R73.03 Preferred learning style: No preference  Learning readiness: Ready    NUTRITION ASSESSMENT  58 yr old [name] referred for Pre diabetes. I am not claiming I have pre diabetes. A1C 6.2%. Eats 2 meals per day. Use to work out and had stopped. Willing to get back on track with exercise. Doesn't like to cook. Eats out often.  Willing to work on using some [employer]. Willing to work on meal prepping and meal planning some.   Clinical Medical Hx:  Past Medical History:  Diagnosis Date   Allergy     Past hx    Anemia    Anxiety    Asthma    Cataract    removed left- forming on the right  DVT and PE from long travel trip    Chronic kidney disease 2006   kidney stones    Clotting disorder 2016   PE - small    Complication of anesthesia    HARD TO WAKE UP AFTER 2ND EYE SURGERY   COVID-19 virus infection    Depression    DVT (deep venous thrombosis) (HCC)    DUE TO A LONG TRAVEL   Fatty liver    Genital herpes 05/06/2018   H/O renal calculi 2006   Headache    MIGRAINES   History of kidney stones    H/O   History of methicillin resistant staphylococcus aureus (MRSA)    Hyperlipidemia    Internal hemorrhoid    PE (pulmonary embolism)    Peptic ulcer disease    Pneumonia    h/o   PTSD (post-traumatic stress disorder)    Sleep apnea    DOES NOT USE CPAP CURRENTLY-MASK DOES NOT FIT CORRECTLY [facility]20)   Spondylosis    Tubular adenoma of colon 2011   Dr Obie    Medications: Medications Ordered Prior to Encounter[1]  Labs:  Lab Results  Component Value Date   HGBA1C 6.2 03/15/2024      Latest Ref Rng & Units 03/15/2024   11:02 AM 09/09/2023    9:54 AM 03/12/2023    9:42 AM  CMP  Glucose 70 - 99 mg/dL 93  97  890   BUN 6 - 23 mg/dL 12  12  11    Creatinine 0.40 - 1.50  mg/dL 9.07  9.06  9.05   Sodium 135 - 145 mEq/L 138  138  137   Potassium 3.5 - 5.1 mEq/L 4.2  4.4  4.1   Chloride 96 - 112 mEq/L 104  105  105   CO2 19 - 32 mEq/L 26  27  27    Calcium  8.4 - 10.5 mg/dL 9.1  9.5  9.5   Total Protein 6.0 - 8.3 g/dL 7.4  7.3  7.6   Total Bilirubin 0.2 - 1.2 mg/dL 0.6  0.8  0.8   Alkaline Phos 39 - 117 U/L 80  83  79   AST 0 - 37 U/L 17  18  20    ALT 0 - 53 U/L 18  20  22     Lipid Panel     Component Value Date/Time   CHOL 122 03/15/2024 1102   CHOL 206 (H) 10/19/2014 0955   TRIG 83.0 03/15/2024 1102   TRIG 116 10/19/2014 0955   HDL 47.00 03/15/2024 1102   HDL 55 10/19/2014  0955   CHOLHDL 3 03/15/2024 1102   VLDL 16.6 03/15/2024 1102   LDLCALC 58 03/15/2024 1102   LDLCALC 65 11/08/2019 1006   LDLCALC 128 (H) 10/19/2014 0955   LDLDIRECT 89.0 04/20/2019 1348   Notable Signs/Symptoms: none  Lifestyle & Dietary Hx LIves by himself  Estimated daily fluid intake: 40 oz Supplements:  Sleep:  Stress / self-care: \Current average weekly physical activity: ADL  24-Hr Dietary Recall Typically eats 2 meals per day. Doesn't cook much. Prefers [employer] or [employer]   Estimated Energy Needs Calories: 1800-2000 Carbohydrate: 200g Protein: 135g Fat: 50g   NUTRITION DIAGNOSIS  NB-1.1 Food and nutrition-related knowledge deficit As related to Pre diabetes.  As evidenced by A1C 6.2%.   NUTRITION INTERVENTION  Nutrition education (E-1) on the following topics:  Nutrition and  Pre Diabetes education provided on My Plate, CHO counting, meal planning, portion sizes, timing of meals, avoiding snacks between meals unless having a low blood sugar, target ranges for A1C and blood sugars, signs/symptoms and treatment of hyper/hypoglycemia, monitoring blood sugars, taking medications as prescribed, benefits of exercising 30 minutes per day and prevention of complications of DM. Lifestyle Medicine  - Whole Food, Plant Predominant Nutrition is highly recommended: Eat  Plenty of vegetables, Mushrooms, fruits, Legumes, Whole Grains, Nuts, seeds in lieu of processed meats, processed snacks/pastries red meat, poultry, eggs.    -It is better to avoid simple carbohydrates including: Cakes, Sweet Desserts, Ice Cream, Soda (diet and regular), Sweet Tea, Candies, Chips, Cookies, Store Bought Juices, Alcohol in Excess of  1-2 drinks a day, Lemonade,  Artificial Sweeteners, Doughnuts, Coffee Creamers, Sugar-free Products, etc, etc.  This is not a complete list.....  Exercise: If you are able: 30 -60 minutes a day ,4 days a week, or 150 minutes a week.  The longer the better.  Combine stretch, strength, and aerobic activities.  If you were told in the past that you have high risk for cardiovascular diseases, you may seek evaluation by your heart doctor prior to initiating moderate to intense exercise programs.   Handouts Provided Include  Pre Diabetes My Plate Lifestyle Medicine  Learning Style & Readiness for Change Teaching method utilized: Visual & Auditory  Demonstrated degree of understanding via: Teach Back  Barriers to learning/adherence to lifestyle change: willingness to start cooking and meal prepping.  Goals Established by Pt  Get back to the gym for 1-2 hours a day 5 days per week Eat thee meals per day Try some Healthy Choice Meals for lunch and dinner. Get A1C less than 5.7% Cut out processed food and fast food. Watch portion sizes.  MONITORING & EVALUATION Dietary intake, weekly physical activity, and weight in 2 months.  Next Steps  Patient is to work on eating better balanced meals and exercise..     [1]  Current Outpatient Medications on File Prior to Visit  Medication Sig Dispense Refill   acetaminophen  (TYLENOL ) 500 MG tablet Take 1,000 mg by mouth every 6 (six) hours as needed for moderate pain.     albuterol  (PROVENTIL  HFA;VENTOLIN  HFA) 108 (Anthony Base) MCG/ACT inhaler Inhale 2 puffs into the lungs every 6 (six) hours as needed. For  shortness of breath 1 Inhaler 1   aspirin EC 81 MG tablet Take 81 mg by mouth daily.     atorvastatin  (LIPITOR) 10 MG tablet Take 1 tablet (10 mg total) by mouth daily. Anthony tablet 3   Brinzolamide-Brimonidine 1-0.2 % SUSP Apply to eye.     budesonide -formoterol  (SYMBICORT ) 160-4.5 MCG/ACT  inhaler Inhale 2 puffs into the lungs.     busPIRone  (BUSPAR ) 15 MG tablet Take 15 mg by mouth.     celecoxib  (CELEBREX ) 200 MG capsule Take 200 mg by mouth 3 (three) times daily.     cetirizine  (ZYRTEC ) 10 MG tablet Take 10 mg by mouth.     Cholecalciferol (VITAMIN D3 PO) Take 1 tablet by mouth daily.     fluticasone  (FLONASE ) 50 MCG/ACT nasal spray Place into the nose.     guaiFENesin  (MUCINEX ) 600 MG 12 hr tablet Take 2 tablets (1,200 mg total) by mouth 2 (two) times daily as needed. 60 tablet 1   HYDROcodone -acetaminophen  (NORCO/VICODIN) 5-325 MG tablet Take 1 tablet by mouth every 6 (six) hours as needed.     hydrocortisone 2.5 % cream Apply topically.     ketoconazole (NIZORAL) 2 % cream Apply topically.     Multiple Vitamin (MULTIVITAMIN) tablet Take 1 tablet by mouth daily.     nortriptyline  (PAMELOR ) 25 MG capsule Take 1 capsule (25 mg total) by mouth at bedtime. 30 capsule 7   OXcarbazepine (TRILEPTAL) 150 MG tablet Take 150 mg by mouth. (Patient taking differently: Take 150 mg by mouth as needed.)     prednisoLONE acetate (PRED FORTE) 1 % ophthalmic suspension Apply 1 drop to eye.     riboflavin (VITAMIN B-2) 100 MG TABS tablet Take 400 mg by mouth.     sertraline (ZOLOFT) 50 MG tablet Take 50 mg by mouth. (Patient taking differently: Take 50 mg by mouth as needed.)     tadalafil  (CIALIS ) 20 MG tablet Take 0.5-1 tablets (10-20 mg total) by mouth daily as needed for erectile dysfunction (take 45 minutes prior to sexual activity). 30 tablet 6   Ubrogepant  (UBRELVY ) 100 MG TABS Take 1 tablet (100 mg total) by mouth as needed. May repeat after 2 hours.  Maximum 2 tablets in 24 hours. 16 tablet 11    valACYclovir  (VALTREX ) 1000 MG tablet Take 1/2 tablet by mouth twice daily for 3 days or 1 tablet by mouth every day for 5 days at onset of breakout. (Patient taking differently: Take 500 mg by mouth as needed. Take 1/2 tablet by mouth twice daily for 3 days or 1 tablet by mouth every day for 5 days at onset of breakout.) Anthony tablet 1   [DISCONTINUED] omeprazole  (PRILOSEC) 20 MG capsule Take 1 capsule (20 mg total) by mouth daily. 30 capsule 0   [DISCONTINUED] topiramate (TOPAMAX) 25 MG tablet topiramate 25 mg tablet (Patient not taking: No sig reported)     No current facility-administered medications on file prior to visit.

## 2024-04-19 ENCOUNTER — Encounter: Payer: Self-pay | Admitting: Internal Medicine

## 2024-04-19 ENCOUNTER — Ambulatory Visit: Admitting: Internal Medicine

## 2024-04-19 VITALS — BP 138/80 | HR 75 | Temp 98.0°F | Ht 66.0 in | Wt 218.0 lb

## 2024-04-19 DIAGNOSIS — R10A1 Flank pain, right side: Secondary | ICD-10-CM | POA: Diagnosis not present

## 2024-04-19 DIAGNOSIS — E785 Hyperlipidemia, unspecified: Secondary | ICD-10-CM

## 2024-04-19 DIAGNOSIS — R7303 Prediabetes: Secondary | ICD-10-CM | POA: Diagnosis not present

## 2024-04-19 DIAGNOSIS — E559 Vitamin D deficiency, unspecified: Secondary | ICD-10-CM | POA: Diagnosis not present

## 2024-04-19 DIAGNOSIS — S29012A Strain of muscle and tendon of back wall of thorax, initial encounter: Secondary | ICD-10-CM | POA: Insufficient documentation

## 2024-04-19 DIAGNOSIS — R1011 Right upper quadrant pain: Secondary | ICD-10-CM

## 2024-04-19 MED ORDER — TIZANIDINE HCL 4 MG PO TABS: 4.0000 mg | ORAL_TABLET | Freq: Four times a day (QID) | ORAL | 0 refills | Status: AC | PRN

## 2024-04-19 NOTE — Assessment & Plan Note (Signed)
 Lab Results  Component Value Date   HGBA1C 6.2 03/15/2024   Stable, pt to continue current medical treatment  - diet, wt control

## 2024-04-19 NOTE — Patient Instructions (Signed)
 Please take all new medication as prescribed  - the tizanidine  as needed for muscle relaxer  Please continue all other medications as before, and refills have been done if requested.  Please have the pharmacy call with any other refills you may need.  Please keep your appointments with your specialists as you may have planned  You will be contacted regarding the referral for: CT renal  Please go to the LAB at the blood drawing area for the tests to be done  You will be contacted by phone if any changes need to be made immediately.  Otherwise, you will receive a letter about your results with an explanation, but please check with MyChart first.

## 2024-04-19 NOTE — Assessment & Plan Note (Signed)
 Lab Results  Component Value Date   LDLCALC 58 03/15/2024   Stable, pt to continue current statin lipitor 10 mg qd

## 2024-04-19 NOTE — Assessment & Plan Note (Signed)
 Last vitamin D  Lab Results  Component Value Date   VD25OH 39.25 09/09/2023   Low, to start oral replacement

## 2024-04-19 NOTE — Assessment & Plan Note (Signed)
?   Msk but not amenable to tx with flexeril  ; for tizanidine  4 mg prn, UA cbc bmp and CT renal r/o stone

## 2024-04-19 NOTE — Progress Notes (Signed)
 Patient ID: Anthony [name], male   DOB: 07-06-1965, 58 y.o.   MRN: [id number]        Chief Complaint: follow up right flank pain, preDM, low vit d, hld       HPI:  Anthony [name] is a 58 y.o. male here with c/o 10 days onset right flank pain with some pain radiating around and down towards the RLQ, Denies urinary symptoms such as dysuria, frequency, urgency, hematuria or n/v, fever, chills.  Was seen at [facility] 1 wk ago then ED visit 5 days ago with pt reports ua neg, no imaging done, and tx with flexeril  which did not seem to help.  Denies worsening reflux, dysphagia, n/v, bowel change or blood.  Pt denies chest pain, increased sob or doe, wheezing, orthopnea, PND, increased LE swelling, palpitations, dizziness or syncope.       Wt Readings from Last 3 Encounters:  04/19/24 218 lb (98.9 kg)  04/18/24 217 lb (98.4 kg)  03/15/24 209 lb (94.8 kg)   BP Readings from Last 3 Encounters:  04/19/24 138/80  03/15/24 118/80  03/15/24 118/80         Past Medical History:  Diagnosis Date   Allergy     Past hx    Anemia    Anxiety    Asthma    Cataract    removed left- forming on the right  DVT and PE from long travel trip    Chronic kidney disease 2006   kidney stones    Clotting disorder 2016   PE - small    Complication of anesthesia    HARD TO WAKE UP AFTER 2ND EYE SURGERY   COVID-19 virus infection    Depression    DVT (deep venous thrombosis) (HCC)    DUE TO A LONG TRAVEL   Fatty liver    Genital herpes 05/06/2018   H/O renal calculi 2006   Headache    MIGRAINES   History of kidney stones    H/O   History of methicillin resistant staphylococcus aureus (MRSA)    Hyperlipidemia    Internal hemorrhoid    PE (pulmonary embolism)    Peptic ulcer disease    Pneumonia    h/o   PTSD (post-traumatic stress disorder)    Sleep apnea    DOES NOT USE CPAP CURRENTLY-MASK DOES NOT FIT CORRECTLY [facility]20)   Spondylosis    Tubular adenoma of colon 2011    Dr Anthony [name]   Past Surgical History:  Procedure Laterality Date   CATARACT EXTRACTION Left 1996   Dr Anthony [name]; OS   COLONOSCOPY     colonoscopy with polypectomy  2011   Dr Anthony [name], hemorrhoids   CYSTOSCOPY W/ STONE MANIPULATION  2006   EYE SURGERY Left    INGUINAL HERNIA REPAIR Right 1990   POLYPECTOMY     SHOULDER SURGERY Right 12/17/2010   Dr Anthony [name] ; shoulder impingement & torn tendon   TRANSURETHRAL RESECTION OF PROSTATE N/A 02/23/2020   Procedure: TRANSURETHRAL RESECTION OF THE PROSTATE (TURP);  Surgeon: Anthony Redell BROCKS, MD;  Location: [facility] ORS;  Service: Urology;  Laterality: N/A;    reports that he has never smoked. He has never used smokeless tobacco. He reports current alcohol use of about 1.0 standard drink of alcohol per week. He reports that he does not use drugs. family history includes Cancer in his paternal grandmother; Dementia in his father and maternal aunt; Diabetes in his father; Esophageal cancer  in his maternal uncle; Glaucoma in his father; Heart attack in his maternal uncle; Heart attack (age of onset: 87) in his mother; Heart attack (age of onset: 33) in his maternal grandmother; Stroke (age of onset: 24) in his father. Allergies[1] Medications Ordered Prior to Encounter[2]      ROS:  All others reviewed and negative.  Objective        PE:  BP 138/80 (BP Location: Left Arm, Patient Position: Sitting, Cuff Size: Normal)   Pulse 75   Temp 98 F (36.7 C) (Oral)   Ht 5' 6 (1.676 m)   Wt 218 lb (98.9 kg)   SpO2 97%   BMI 35.19 kg/m                 Constitutional: Pt appears in NAD               HENT: Head: NCAT.                Right Ear: External ear normal.                 Left Ear: External ear normal.                Eyes: . Pupils are equal, round, and reactive to light. Conjunctivae and EOM are normal               Nose: without d/c or deformity               Neck: Neck supple. Gross normal ROM               Cardiovascular: Normal rate and regular  rhythm.                 Pulmonary/Chest: Effort normal and breath sounds without rales or wheezing.                Abd:  Soft, NT, ND, + BS, no organomegaly               Neurological: Pt is alert. At baseline orientation, motor grossly intact               Skin: Skin is warm. No rashes, no other new lesions, LE edema - none               Psychiatric: Pt behavior is normal without agitation   Micro: none  Cardiac tracings I have personally interpreted today:  none  Pertinent Radiological findings (summarize): none   Lab Results  Component Value Date   WBC 5.8 03/15/2024   HGB 14.6 03/15/2024   HCT 44.1 03/15/2024   PLT 252.0 03/15/2024   GLUCOSE 93 03/15/2024   CHOL 122 03/15/2024   TRIG 83.0 03/15/2024   HDL 47.00 03/15/2024   LDLDIRECT 89.0 04/20/2019   LDLCALC 58 03/15/2024   ALT 18 03/15/2024   AST 17 03/15/2024   NA 138 03/15/2024   K 4.2 03/15/2024   CL 104 03/15/2024   CREATININE 0.92 03/15/2024   BUN 12 03/15/2024   CO2 26 03/15/2024   TSH 1.14 09/09/2023   PSA 0.82 09/09/2023   HGBA1C 6.2 03/15/2024   Assessment/Plan:  Anthony [name] is a 58 y.o. Black or African American [2] male with  has a past medical history of Allergy , Anemia, Anxiety, Asthma, Cataract, Chronic kidney disease (2006), Clotting disorder (2016), Complication of anesthesia, COVID-19 virus infection, Depression, DVT (deep venous thrombosis) (HCC), Fatty liver, Genital herpes (05/06/2018), H/O renal calculi (2006),  Headache, History of kidney stones, History of methicillin resistant staphylococcus aureus (MRSA), Hyperlipidemia, Internal hemorrhoid, PE (pulmonary embolism), Peptic ulcer disease, Pneumonia, PTSD (post-traumatic stress disorder), Sleep apnea, Spondylosis, and Tubular adenoma of colon (2011).  Vitamin D  deficiency Last vitamin D  Lab Results  Component Value Date   VD25OH 39.25 09/09/2023   Low, to start oral replacement   Prediabetes Lab Results  Component Value Date   HGBA1C  6.2 03/15/2024   Stable, pt to continue current medical treatment  - diet, wt control   Hyperlipidemia Lab Results  Component Value Date   LDLCALC 58 03/15/2024   Stable, pt to continue current statin lipitor 10 mg qd   Right flank pain ? Msk but not amenable to tx with flexeril  ; for tizanidine  4 mg prn, UA cbc bmp and CT renal r/o stone  Followup: Return if symptoms worsen or fail to improve.  Lynwood Rush, MD 04/19/2024 6:14 PM [facility]  Primary Care - [facility]     [1]  Allergies Allergen Reactions   Aleve [Naproxen] Rash    Patient stated it was years ago   Nsaids Other (See Comments)    Bleeding ulcers  [2]  Current Outpatient Medications on File Prior to Visit  Medication Sig Dispense Refill   acetaminophen  (TYLENOL ) 500 MG tablet Take 1,000 mg by mouth every 6 (six) hours as needed for moderate pain.     albuterol  (PROVENTIL  HFA;VENTOLIN  HFA) 108 (90 Base) MCG/ACT inhaler Inhale 2 puffs into the lungs every 6 (six) hours as needed. For shortness of breath 1 Inhaler 1   aspirin EC 81 MG tablet Take 81 mg by mouth daily.     atorvastatin  (LIPITOR) 10 MG tablet Take 1 tablet (10 mg total) by mouth daily. 90 tablet 3   Brinzolamide-Brimonidine 1-0.2 % SUSP Apply to eye.     budesonide -formoterol  (SYMBICORT ) 160-4.5 MCG/ACT inhaler Inhale 2 puffs into the lungs.     busPIRone  (BUSPAR ) 15 MG tablet Take 15 mg by mouth.     celecoxib  (CELEBREX ) 200 MG capsule Take 200 mg by mouth 3 (three) times daily.     cetirizine  (ZYRTEC ) 10 MG tablet Take 10 mg by mouth.     Cholecalciferol (VITAMIN D3 PO) Take 1 tablet by mouth daily.     fluticasone  (FLONASE ) 50 MCG/ACT nasal spray Place into the nose.     guaiFENesin  (MUCINEX ) 600 MG 12 hr tablet Take 2 tablets (1,200 mg total) by mouth 2 (two) times daily as needed. 60 tablet 1   HYDROcodone -acetaminophen  (NORCO/VICODIN) 5-325 MG tablet Take 1 tablet by mouth every 6 (six) hours as  needed.     hydrocortisone 2.5 % cream Apply topically.     ketoconazole (NIZORAL) 2 % cream Apply topically.     Multiple Vitamin (MULTIVITAMIN) tablet Take 1 tablet by mouth daily.     nortriptyline  (PAMELOR ) 25 MG capsule Take 1 capsule (25 mg total) by mouth at bedtime. 30 capsule 7   OXcarbazepine (TRILEPTAL) 150 MG tablet Take 150 mg by mouth. (Patient taking differently: Take 150 mg by mouth as needed.)     prednisoLONE acetate (PRED FORTE) 1 % ophthalmic suspension Apply 1 drop to eye.     riboflavin (VITAMIN B-2) 100 MG TABS tablet Take 400 mg by mouth.     sertraline (ZOLOFT) 50 MG tablet Take 50 mg by mouth. (Patient taking differently: Take 50 mg by mouth as needed.)     tadalafil  (CIALIS ) 20 MG tablet Take  0.5-1 tablets (10-20 mg total) by mouth daily as needed for erectile dysfunction (take 45 minutes prior to sexual activity). 30 tablet 6   Ubrogepant  (UBRELVY ) 100 MG TABS Take 1 tablet (100 mg total) by mouth as needed. May repeat after 2 hours.  Maximum 2 tablets in 24 hours. 16 tablet 11   valACYclovir  (VALTREX ) 1000 MG tablet Take 1/2 tablet by mouth twice daily for 3 days or 1 tablet by mouth every day for 5 days at onset of breakout. (Patient taking differently: Take 500 mg by mouth as needed. Take 1/2 tablet by mouth twice daily for 3 days or 1 tablet by mouth every day for 5 days at onset of breakout.) 90 tablet 1   [DISCONTINUED] omeprazole  (PRILOSEC) 20 MG capsule Take 1 capsule (20 mg total) by mouth daily. 30 capsule 0   [DISCONTINUED] topiramate (TOPAMAX) 25 MG tablet topiramate 25 mg tablet (Patient not taking: No sig reported)     No current facility-administered medications on file prior to visit.

## 2024-04-20 ENCOUNTER — Ambulatory Visit: Payer: Self-pay | Admitting: Internal Medicine

## 2024-04-20 LAB — CBC WITH DIFFERENTIAL/PLATELET
Basophils Absolute: 0.1 K/uL (ref 0.0–0.1)
Basophils Relative: 1.7 % (ref 0.0–3.0)
Eosinophils Absolute: 0.6 K/uL (ref 0.0–0.7)
Eosinophils Relative: 10.4 % — ABNORMAL HIGH (ref 0.0–5.0)
HCT: 44.7 % (ref 39.0–52.0)
Hemoglobin: 14.8 g/dL (ref 13.0–17.0)
Lymphocytes Relative: 33.2 % (ref 12.0–46.0)
Lymphs Abs: 1.9 K/uL (ref 0.7–4.0)
MCHC: 33.2 g/dL (ref 30.0–36.0)
MCV: 84.2 fl (ref 78.0–100.0)
Monocytes Absolute: 0.5 K/uL (ref 0.1–1.0)
Monocytes Relative: 9.2 % (ref 3.0–12.0)
Neutro Abs: 2.6 K/uL (ref 1.4–7.7)
Neutrophils Relative %: 45.5 % (ref 43.0–77.0)
Platelets: 238 K/uL (ref 150.0–400.0)
RBC: 5.31 Mil/uL (ref 4.22–5.81)
RDW: 15.5 % (ref 11.5–15.5)
WBC: 5.7 K/uL (ref 4.0–10.5)

## 2024-04-20 LAB — URINALYSIS, ROUTINE W REFLEX MICROSCOPIC
Bilirubin Urine: NEGATIVE
Ketones, ur: NEGATIVE
Leukocytes,Ua: NEGATIVE
Nitrite: NEGATIVE
Specific Gravity, Urine: 1.02 (ref 1.000–1.030)
Total Protein, Urine: NEGATIVE
Urine Glucose: NEGATIVE
Urobilinogen, UA: 0.2 (ref 0.0–1.0)
WBC, UA: NONE SEEN (ref 0–?)
pH: 6 (ref 5.0–8.0)

## 2024-04-20 LAB — BASIC METABOLIC PANEL WITH GFR
BUN: 12 mg/dL (ref 6–23)
CO2: 24 meq/L (ref 19–32)
Calcium: 9.2 mg/dL (ref 8.4–10.5)
Chloride: 105 meq/L (ref 96–112)
Creatinine, Ser: 0.92 mg/dL (ref 0.40–1.50)
GFR: 92.51 mL/min (ref >60.00)
Glucose, Bld: 75 mg/dL (ref 70–99)
Potassium: 4.3 meq/L (ref 3.5–5.1)
Sodium: 137 meq/L (ref 135–145)

## 2024-04-20 LAB — HEPATIC FUNCTION PANEL
ALT: 26 U/L (ref 3–53)
AST: 24 U/L (ref 5–37)
Albumin: 4.5 g/dL (ref 3.5–5.2)
Alkaline Phosphatase: 80 U/L (ref 39–117)
Bilirubin, Direct: 0.1 mg/dL (ref 0.1–0.3)
Total Bilirubin: 0.5 mg/dL (ref 0.2–1.2)
Total Protein: 7.5 g/dL (ref 6.0–8.3)

## 2024-04-20 NOTE — Progress Notes (Signed)
 The test results show that your current treatment is OK, as the tests are stable.  Please continue the same plan.  There is no other need for change of treatment or further evaluation based on these results, at this time.  thanks

## 2024-04-25 ENCOUNTER — Ambulatory Visit
Admission: RE | Admit: 2024-04-25 | Discharge: 2024-04-25 | Disposition: A | Discharge status: Home / Self Care | Source: Ambulatory Visit | Attending: Internal Medicine | Admitting: Internal Medicine

## 2024-04-25 DIAGNOSIS — R1011 Right upper quadrant pain: Secondary | ICD-10-CM

## 2024-04-26 ENCOUNTER — Other Ambulatory Visit: Payer: Self-pay | Admitting: Internal Medicine

## 2024-05-10 ENCOUNTER — Ambulatory Visit: Admitting: Urology

## 2024-05-11 ENCOUNTER — Ambulatory Visit: Payer: Self-pay | Admitting: Urology

## 2024-05-23 NOTE — Progress Notes (Unsigned)
"   °  °  Cardiology Office Note Date:  05/23/2024  ID:  Anthony [name] Sr., DOB August 22, 1965, MRN [id number] PCP:  Norleen Lynwood ORN, MD  Cardiologist:  Joelle VEAR Ren Donley, MD  No chief complaint on file.    Problems Palpitations TTE 7/23: 55-60%, mild LVH EM 7/23: 21 days, negative Family History of ASCVD (Father CVA 67, Uncle MI 79, Mother MI 37) DVT 04/2015 M: ASA81, AN10,   Visits  1/26: LDL 58 (11/25), Lpa, genetic testing, CAC    Discussed the use of AI scribe software for clinical note transcription with the patient, who gave verbal consent to proceed.  History of Present Illness     ROS: Please see the history of present illness. All other systems are reviewed and negative.    PHYSICAL EXAM: VS:  There were no vitals taken for this visit. , BMI There is no height or weight on file to calculate BMI. GEN: Well nourished, well developed, in no acute distress HEENT: normal Neck: no JVD, carotid bruits, or masses Cardiac: ***RRR; no murmurs, rubs, or gallops,no edema  Respiratory:  CTAB bilaterally, normal work of breathing GI: soft, nontender, nondistended, + BS Extremities: No LE edema Skin: warm and dry, no rash Neuro:  Strength and sensation are intact  EKG: ***  Recent Labs: Reviewed  Studies: Reviewed  Assessment and Plan Assessment & Plan      Signed, Joelle VEAR Ren Donley, MD  05/23/2024 6:23 AM    [facility] "

## 2024-05-24 ENCOUNTER — Encounter (HOSPITAL_BASED_OUTPATIENT_CLINIC_OR_DEPARTMENT_OTHER): Payer: Self-pay

## 2024-05-25 ENCOUNTER — Ambulatory Visit

## 2024-05-25 DIAGNOSIS — I824Y9 Acute embolism and thrombosis of unspecified deep veins of unspecified proximal lower extremity: Secondary | ICD-10-CM

## 2024-05-25 DIAGNOSIS — Z8249 Family history of ischemic heart disease and other diseases of the circulatory system: Secondary | ICD-10-CM

## 2024-05-25 DIAGNOSIS — R002 Palpitations: Secondary | ICD-10-CM

## 2024-05-27 ENCOUNTER — Emergency Department: Admission: EM | Admit: 2024-05-27 | Discharge: 2024-05-27 | Disposition: A | Discharge status: Home / Self Care

## 2024-05-27 ENCOUNTER — Other Ambulatory Visit: Payer: Self-pay

## 2024-05-27 DIAGNOSIS — K625 Hemorrhage of anus and rectum: Secondary | ICD-10-CM | POA: Insufficient documentation

## 2024-05-27 LAB — BASIC METABOLIC PANEL WITH GFR
Anion gap: 10 (ref 5–15)
BUN: 12 mg/dL (ref 6–20)
CO2: 22 mmol/L (ref 22–32)
Calcium: 9.6 mg/dL (ref 8.9–10.3)
Chloride: 105 mmol/L (ref 98–111)
Creatinine, Ser: 0.9 mg/dL (ref 0.61–1.24)
GFR, Estimated: >60 mL/min
Glucose, Bld: 79 mg/dL (ref 70–99)
Potassium: 4.1 mmol/L (ref 3.5–5.1)
Sodium: 138 mmol/L (ref 135–145)

## 2024-05-27 LAB — CBC
HCT: 43.4 % (ref 39.0–52.0)
Hemoglobin: 14.6 g/dL (ref 13.0–17.0)
MCH: 28.1 pg (ref 26.0–34.0)
MCHC: 33.6 g/dL (ref 30.0–36.0)
MCV: 83.5 fL (ref 80.0–100.0)
Platelets: 239 10*3/uL (ref 150–400)
RBC: 5.2 MIL/uL (ref 4.22–5.81)
RDW: 16 % — ABNORMAL HIGH (ref 11.5–15.5)
WBC: 5.7 10*3/uL (ref 4.0–10.5)
nRBC: 0 % (ref 0.0–0.2)

## 2024-05-27 LAB — PROTIME-INR
INR: 1 (ref 0.8–1.2)
Prothrombin Time: 13.3 s (ref 11.4–15.2)

## 2024-05-27 LAB — APTT: aPTT: 33 s (ref 24–36)

## 2024-05-27 NOTE — ED Triage Notes (Signed)
 Pt to ED for blood in stool x2 noticed this morning. Hx bleeding ulcers

## 2024-05-27 NOTE — ED Provider Notes (Signed)
 "  [facility] Provider Note    Event Date/Time   First MD Initiated Contact with Patient 05/27/24 1325     (approximate)   History   Melena   HPI  CAULIN BEGLEY Sr. is a 59 y.o. male with a past medical history of DVT, colonic polyps, duodenal ulcer, hyperlipidemia, presenting to the emergency department complaining of blood in his stool.  Patient reports that today he has had 2 episodes of bloody stool with bright red blood.  He denies any black or tarry stool.  Denies any abdominal pain, rectal pain, dizziness, or lightheadedness.  He does not take any blood thinners aside from aspirin.     Physical Exam   Triage Vital Signs: ED Triage Vitals  Encounter Vitals Group     BP 05/27/24 1323 132/83     Girls Systolic BP Percentile --      Girls Diastolic BP Percentile --      Boys Systolic BP Percentile --      Boys Diastolic BP Percentile --      Pulse Rate 05/27/24 1323 87     Resp 05/27/24 1323 18     Temp 05/27/24 1323 97.9 F (36.6 C)     Temp src --      SpO2 05/27/24 1323 97 %     Weight 05/27/24 1321 215 lb (97.5 kg)     Height 05/27/24 1321 5' 6.5 (1.689 m)     Head Circumference --      Peak Flow --      Pain Score 05/27/24 1321 0     Pain Loc --      Pain Education --      Exclude from Growth Chart --     Most recent vital signs: Vitals:   05/27/24 1323  BP: 132/83  Pulse: 87  Resp: 18  Temp: 97.9 F (36.6 C)  SpO2: 97%     General: Awake, no distress.  CV:  Good peripheral perfusion.  Resp:  Normal effort.  Abd:  No distention.  Other:     ED Results / Procedures / Treatments   Labs (all labs ordered are listed, but only abnormal results are displayed) Labs Reviewed  CBC - Abnormal; Notable for the following components:      Result Value   RDW 16.0 (*)    All other components within normal limits  BASIC METABOLIC PANEL WITH GFR  PROTIME-INR  APTT      EKG     RADIOLOGY     PROCEDURES:  Critical Care performed: No  Procedures   MEDICATIONS ORDERED IN ED: Medications - No data to display   IMPRESSION / MDM / ASSESSMENT AND PLAN / ED COURSE  I reviewed the triage vital signs and the nursing notes.                              Differential diagnosis includes, but is not limited to, gastric ulcers, duodenal ulcer, diverticulitis, polyps, internal hemorrhoids, fissures  Patient's presentation is most consistent with acute illness / injury with system symptoms.  Patient is a 59 year old male with a past medical history of DVT, colonic polyps, duodenal ulcer, hyperlipidemia, presenting to the emergency department complaining of blood in his stool.    Blood work was performed and was overall unremarkable.  Hemoglobin was stable.  Coags normal.  I discussed with the patient his results at length.  Discussed plan  for discharge with instructions to follow-up with his primary care physician within the next few days as well as to follow-up with a GI specialist to soon as possible.  Return precautions were discussed at length.  Patient agreeable with this plan of care.      FINAL CLINICAL IMPRESSION(S) / ED DIAGNOSES   Final diagnoses:  Bright red rectal bleeding     Rx / DC Orders   ED Discharge Orders     None        Note:  This document was prepared using Dragon voice recognition software and may include unintentional dictation errors.   Rexford Reche HERO, MD 05/27/24 [phone]  "

## 2024-05-31 ENCOUNTER — Encounter: Payer: Self-pay | Admitting: Internal Medicine

## 2024-05-31 DIAGNOSIS — K921 Melena: Secondary | ICD-10-CM

## 2024-06-01 ENCOUNTER — Encounter: Payer: Self-pay | Admitting: Physician Assistant

## 2024-06-20 ENCOUNTER — Encounter: Admitting: Nutrition

## 2024-06-28 ENCOUNTER — Ambulatory Visit: Admitting: Physician Assistant

## 2024-07-04 ENCOUNTER — Ambulatory Visit: Admitting: Neurology

## 2024-12-20 ENCOUNTER — Ambulatory Visit: Admitting: Urology

## 2025-03-20 ENCOUNTER — Ambulatory Visit
